# Patient Record
Sex: Female | Born: 1935
Health system: Southern US, Community
[De-identification: ages and names within clinical notes are randomized; demographics above are authoritative.]

## PROBLEM LIST (undated history)

## (undated) DIAGNOSIS — T7840XA Allergy, unspecified, initial encounter: Secondary | ICD-10-CM

## (undated) DIAGNOSIS — I4891 Unspecified atrial fibrillation: Secondary | ICD-10-CM

## (undated) DIAGNOSIS — J45909 Unspecified asthma, uncomplicated: Secondary | ICD-10-CM

## (undated) DIAGNOSIS — K219 Gastro-esophageal reflux disease without esophagitis: Secondary | ICD-10-CM

## (undated) DIAGNOSIS — I509 Heart failure, unspecified: Secondary | ICD-10-CM

## (undated) DIAGNOSIS — A150 Tuberculosis of lung: Secondary | ICD-10-CM

## (undated) DIAGNOSIS — I272 Pulmonary hypertension, unspecified: Secondary | ICD-10-CM

## (undated) DIAGNOSIS — I340 Nonrheumatic mitral (valve) insufficiency: Secondary | ICD-10-CM

## (undated) DIAGNOSIS — E7800 Pure hypercholesterolemia, unspecified: Secondary | ICD-10-CM

## (undated) DIAGNOSIS — J449 Chronic obstructive pulmonary disease, unspecified: Secondary | ICD-10-CM

## (undated) DIAGNOSIS — I10 Essential (primary) hypertension: Secondary | ICD-10-CM

## (undated) HISTORY — PX: CHOLECYSTECTOMY: SHX55

## (undated) HISTORY — DX: Gastro-esophageal reflux disease without esophagitis: K21.9

## (undated) HISTORY — DX: Essential (primary) hypertension: I10

## (undated) HISTORY — DX: Pulmonary hypertension, unspecified: I27.20

## (undated) HISTORY — DX: Nonrheumatic mitral (valve) insufficiency: I34.0

## (undated) HISTORY — DX: Tuberculosis of lung: A15.0

## (undated) HISTORY — DX: Chronic obstructive pulmonary disease, unspecified: J44.9

## (undated) HISTORY — DX: Allergy, unspecified, initial encounter: T78.40XA

## (undated) HISTORY — DX: Pure hypercholesterolemia, unspecified: E78.00

---

## 1962-04-19 HISTORY — PX: TONSILLECTOMY: SUR1361

## 2004-05-20 ENCOUNTER — Ambulatory Visit: Payer: Self-pay | Admitting: Internal Medicine

## 2004-05-29 ENCOUNTER — Ambulatory Visit: Payer: Self-pay | Admitting: Internal Medicine

## 2004-06-23 ENCOUNTER — Ambulatory Visit: Payer: Self-pay | Admitting: Internal Medicine

## 2004-08-17 ENCOUNTER — Ambulatory Visit: Payer: Self-pay | Admitting: Internal Medicine

## 2004-11-20 ENCOUNTER — Ambulatory Visit: Payer: Self-pay | Admitting: Internal Medicine

## 2005-02-26 ENCOUNTER — Ambulatory Visit: Payer: Self-pay

## 2005-08-26 ENCOUNTER — Ambulatory Visit: Payer: Self-pay | Admitting: Internal Medicine

## 2005-09-07 ENCOUNTER — Ambulatory Visit: Payer: Self-pay | Admitting: Internal Medicine

## 2006-03-17 ENCOUNTER — Ambulatory Visit: Payer: Self-pay | Admitting: Internal Medicine

## 2006-04-19 HISTORY — PX: CATARACT EXTRACTION: SUR2

## 2006-09-14 ENCOUNTER — Ambulatory Visit: Payer: Self-pay | Admitting: Internal Medicine

## 2007-03-06 ENCOUNTER — Ambulatory Visit: Payer: Self-pay | Admitting: Specialist

## 2007-09-01 ENCOUNTER — Ambulatory Visit: Payer: Self-pay | Admitting: Gastroenterology

## 2007-10-18 ENCOUNTER — Ambulatory Visit: Payer: Self-pay | Admitting: Internal Medicine

## 2008-03-27 ENCOUNTER — Ambulatory Visit: Payer: Self-pay | Admitting: Internal Medicine

## 2008-09-02 ENCOUNTER — Ambulatory Visit: Payer: Self-pay | Admitting: Gastroenterology

## 2008-10-28 ENCOUNTER — Ambulatory Visit: Payer: Self-pay | Admitting: Internal Medicine

## 2009-01-17 ENCOUNTER — Ambulatory Visit: Payer: Self-pay | Admitting: General Surgery

## 2009-01-22 ENCOUNTER — Ambulatory Visit: Payer: Self-pay | Admitting: General Surgery

## 2009-07-01 ENCOUNTER — Inpatient Hospital Stay: Payer: Self-pay | Admitting: Internal Medicine

## 2009-11-11 ENCOUNTER — Ambulatory Visit: Payer: Self-pay | Admitting: Internal Medicine

## 2009-11-12 ENCOUNTER — Ambulatory Visit: Payer: Self-pay | Admitting: Internal Medicine

## 2010-01-20 ENCOUNTER — Ambulatory Visit: Payer: Self-pay | Admitting: Cardiology

## 2010-12-15 ENCOUNTER — Ambulatory Visit: Payer: Self-pay | Admitting: Internal Medicine

## 2011-05-03 ENCOUNTER — Ambulatory Visit: Payer: Self-pay | Admitting: Internal Medicine

## 2011-05-03 DIAGNOSIS — E785 Hyperlipidemia, unspecified: Secondary | ICD-10-CM | POA: Diagnosis not present

## 2011-05-03 DIAGNOSIS — D72819 Decreased white blood cell count, unspecified: Secondary | ICD-10-CM | POA: Diagnosis not present

## 2011-05-03 DIAGNOSIS — K219 Gastro-esophageal reflux disease without esophagitis: Secondary | ICD-10-CM | POA: Diagnosis not present

## 2011-05-03 DIAGNOSIS — D649 Anemia, unspecified: Secondary | ICD-10-CM | POA: Diagnosis not present

## 2011-05-03 DIAGNOSIS — J449 Chronic obstructive pulmonary disease, unspecified: Secondary | ICD-10-CM | POA: Diagnosis not present

## 2011-05-03 DIAGNOSIS — Z79899 Other long term (current) drug therapy: Secondary | ICD-10-CM | POA: Diagnosis not present

## 2011-05-03 DIAGNOSIS — Z7982 Long term (current) use of aspirin: Secondary | ICD-10-CM | POA: Diagnosis not present

## 2011-05-03 DIAGNOSIS — M810 Age-related osteoporosis without current pathological fracture: Secondary | ICD-10-CM | POA: Diagnosis not present

## 2011-05-03 LAB — RETICULOCYTES: Reticulocyte: 3.4 % — ABNORMAL HIGH (ref 0.5–1.5)

## 2011-05-03 LAB — CBC CANCER CENTER
Basophil #: 0 x10 3/mm (ref 0.0–0.1)
Comment - H1-Com1: NORMAL
Comment - H1-Com2: NORMAL
Lymphocytes: 36 %
MCH: 27 pg (ref 26.0–34.0)
MCHC: 32.7 g/dL (ref 32.0–36.0)
MCV: 83 fL (ref 80–100)
Platelet: 197 x10 3/mm (ref 150–440)

## 2011-05-03 LAB — LACTATE DEHYDROGENASE: LDH: 181 U/L (ref 84–246)

## 2011-05-03 LAB — FERRITIN: Ferritin (ARMC): 28 ng/mL (ref 8–388)

## 2011-05-04 LAB — URINE IEP, RANDOM

## 2011-05-04 LAB — IRON AND TIBC: Iron Bind.Cap.(Total): 321 ug/dL (ref 250–450)

## 2011-05-06 DIAGNOSIS — D72819 Decreased white blood cell count, unspecified: Secondary | ICD-10-CM | POA: Diagnosis not present

## 2011-05-13 LAB — OCCULT BLOOD X 1 CARD TO LAB, STOOL
Occult Blood, Feces: POSITIVE
Occult Blood, Feces: NEGATIVE

## 2011-05-21 ENCOUNTER — Ambulatory Visit: Payer: Self-pay | Admitting: Internal Medicine

## 2011-05-27 DIAGNOSIS — R195 Other fecal abnormalities: Secondary | ICD-10-CM | POA: Diagnosis not present

## 2011-05-27 DIAGNOSIS — D500 Iron deficiency anemia secondary to blood loss (chronic): Secondary | ICD-10-CM | POA: Diagnosis not present

## 2011-06-23 ENCOUNTER — Ambulatory Visit: Payer: Self-pay | Admitting: Internal Medicine

## 2011-06-23 DIAGNOSIS — Z7982 Long term (current) use of aspirin: Secondary | ICD-10-CM | POA: Diagnosis not present

## 2011-06-23 DIAGNOSIS — Z79899 Other long term (current) drug therapy: Secondary | ICD-10-CM | POA: Diagnosis not present

## 2011-06-23 DIAGNOSIS — R161 Splenomegaly, not elsewhere classified: Secondary | ICD-10-CM | POA: Diagnosis not present

## 2011-06-23 DIAGNOSIS — K219 Gastro-esophageal reflux disease without esophagitis: Secondary | ICD-10-CM | POA: Diagnosis not present

## 2011-06-23 DIAGNOSIS — D72819 Decreased white blood cell count, unspecified: Secondary | ICD-10-CM | POA: Diagnosis not present

## 2011-06-23 DIAGNOSIS — E785 Hyperlipidemia, unspecified: Secondary | ICD-10-CM | POA: Diagnosis not present

## 2011-06-23 DIAGNOSIS — M810 Age-related osteoporosis without current pathological fracture: Secondary | ICD-10-CM | POA: Diagnosis not present

## 2011-06-23 DIAGNOSIS — J449 Chronic obstructive pulmonary disease, unspecified: Secondary | ICD-10-CM | POA: Diagnosis not present

## 2011-06-23 DIAGNOSIS — D509 Iron deficiency anemia, unspecified: Secondary | ICD-10-CM | POA: Diagnosis not present

## 2011-06-23 LAB — CBC CANCER CENTER
Basophil #: 0 x10 3/mm (ref 0.0–0.1)
Eosinophil #: 0.2 x10 3/mm (ref 0.0–0.7)
HCT: 37.1 % (ref 35.0–47.0)
Lymphocyte %: 31.4 %
MCH: 27 pg (ref 26.0–34.0)
MCHC: 32.5 g/dL (ref 32.0–36.0)
Monocyte #: 0.4 x10 3/mm (ref 0.0–0.7)
Neutrophil #: 2 x10 3/mm (ref 1.4–6.5)
Neutrophil %: 51.8 %
RBC: 4.46 10*6/uL (ref 3.80–5.20)
RDW: 14.6 % — ABNORMAL HIGH (ref 11.5–14.5)
WBC: 3.9 x10 3/mm (ref 3.6–11.0)

## 2011-06-23 LAB — BASIC METABOLIC PANEL
Anion Gap: 7 (ref 7–16)
Calcium, Total: 9.9 mg/dL (ref 8.5–10.1)
Creatinine: 1.08 mg/dL (ref 0.60–1.30)
EGFR (African American): >60
EGFR (Non-African Amer.): 52 — ABNORMAL LOW
Glucose: 99 mg/dL (ref 65–99)
Osmolality: 285 (ref 275–301)
Sodium: 143 mmol/L (ref 136–145)

## 2011-06-30 DIAGNOSIS — R161 Splenomegaly, not elsewhere classified: Secondary | ICD-10-CM | POA: Diagnosis not present

## 2011-06-30 DIAGNOSIS — D259 Leiomyoma of uterus, unspecified: Secondary | ICD-10-CM | POA: Diagnosis not present

## 2011-06-30 DIAGNOSIS — E785 Hyperlipidemia, unspecified: Secondary | ICD-10-CM | POA: Diagnosis not present

## 2011-06-30 DIAGNOSIS — D509 Iron deficiency anemia, unspecified: Secondary | ICD-10-CM | POA: Diagnosis not present

## 2011-06-30 DIAGNOSIS — M810 Age-related osteoporosis without current pathological fracture: Secondary | ICD-10-CM | POA: Diagnosis not present

## 2011-06-30 DIAGNOSIS — D72819 Decreased white blood cell count, unspecified: Secondary | ICD-10-CM | POA: Diagnosis not present

## 2011-06-30 DIAGNOSIS — K219 Gastro-esophageal reflux disease without esophagitis: Secondary | ICD-10-CM | POA: Diagnosis not present

## 2011-07-05 DIAGNOSIS — R062 Wheezing: Secondary | ICD-10-CM | POA: Diagnosis not present

## 2011-07-05 DIAGNOSIS — I10 Essential (primary) hypertension: Secondary | ICD-10-CM | POA: Diagnosis not present

## 2011-07-05 DIAGNOSIS — J069 Acute upper respiratory infection, unspecified: Secondary | ICD-10-CM | POA: Diagnosis not present

## 2011-07-05 DIAGNOSIS — R05 Cough: Secondary | ICD-10-CM | POA: Diagnosis not present

## 2011-07-09 DIAGNOSIS — R195 Other fecal abnormalities: Secondary | ICD-10-CM | POA: Diagnosis not present

## 2011-07-13 ENCOUNTER — Ambulatory Visit: Payer: Self-pay | Admitting: Gastroenterology

## 2011-07-13 DIAGNOSIS — Z79899 Other long term (current) drug therapy: Secondary | ICD-10-CM | POA: Diagnosis not present

## 2011-07-13 DIAGNOSIS — Z7982 Long term (current) use of aspirin: Secondary | ICD-10-CM | POA: Diagnosis not present

## 2011-07-13 DIAGNOSIS — K2270 Barrett's esophagus without dysplasia: Secondary | ICD-10-CM | POA: Diagnosis not present

## 2011-07-13 DIAGNOSIS — I270 Primary pulmonary hypertension: Secondary | ICD-10-CM | POA: Diagnosis not present

## 2011-07-13 DIAGNOSIS — E7800 Pure hypercholesterolemia, unspecified: Secondary | ICD-10-CM | POA: Diagnosis not present

## 2011-07-13 DIAGNOSIS — Z5309 Procedure and treatment not carried out because of other contraindication: Secondary | ICD-10-CM | POA: Diagnosis not present

## 2011-07-13 DIAGNOSIS — Z8611 Personal history of tuberculosis: Secondary | ICD-10-CM | POA: Diagnosis not present

## 2011-07-13 DIAGNOSIS — K219 Gastro-esophageal reflux disease without esophagitis: Secondary | ICD-10-CM | POA: Diagnosis not present

## 2011-07-13 DIAGNOSIS — Z87891 Personal history of nicotine dependence: Secondary | ICD-10-CM | POA: Diagnosis not present

## 2011-07-13 DIAGNOSIS — D509 Iron deficiency anemia, unspecified: Secondary | ICD-10-CM | POA: Diagnosis not present

## 2011-07-13 DIAGNOSIS — R195 Other fecal abnormalities: Secondary | ICD-10-CM | POA: Diagnosis not present

## 2011-07-13 DIAGNOSIS — K625 Hemorrhage of anus and rectum: Secondary | ICD-10-CM | POA: Diagnosis not present

## 2011-07-13 DIAGNOSIS — Z1211 Encounter for screening for malignant neoplasm of colon: Secondary | ICD-10-CM | POA: Diagnosis not present

## 2011-07-13 DIAGNOSIS — J449 Chronic obstructive pulmonary disease, unspecified: Secondary | ICD-10-CM | POA: Diagnosis not present

## 2011-07-15 DIAGNOSIS — I10 Essential (primary) hypertension: Secondary | ICD-10-CM | POA: Diagnosis not present

## 2011-07-15 DIAGNOSIS — R0602 Shortness of breath: Secondary | ICD-10-CM | POA: Diagnosis not present

## 2011-07-19 ENCOUNTER — Ambulatory Visit: Payer: Self-pay | Admitting: Internal Medicine

## 2011-07-26 DIAGNOSIS — R0602 Shortness of breath: Secondary | ICD-10-CM | POA: Diagnosis not present

## 2011-08-10 DIAGNOSIS — R05 Cough: Secondary | ICD-10-CM | POA: Diagnosis not present

## 2011-08-10 DIAGNOSIS — I10 Essential (primary) hypertension: Secondary | ICD-10-CM | POA: Diagnosis not present

## 2011-08-10 DIAGNOSIS — R062 Wheezing: Secondary | ICD-10-CM | POA: Diagnosis not present

## 2011-08-10 DIAGNOSIS — J069 Acute upper respiratory infection, unspecified: Secondary | ICD-10-CM | POA: Diagnosis not present

## 2011-08-18 ENCOUNTER — Ambulatory Visit: Payer: Self-pay | Admitting: Internal Medicine

## 2011-08-18 DIAGNOSIS — D509 Iron deficiency anemia, unspecified: Secondary | ICD-10-CM | POA: Diagnosis not present

## 2011-08-18 DIAGNOSIS — R161 Splenomegaly, not elsewhere classified: Secondary | ICD-10-CM | POA: Diagnosis not present

## 2011-08-18 DIAGNOSIS — D72819 Decreased white blood cell count, unspecified: Secondary | ICD-10-CM | POA: Diagnosis not present

## 2011-08-18 LAB — CBC CANCER CENTER
Eosinophil %: 2.1 %
HCT: 37.3 % (ref 35.0–47.0)
Lymphocyte #: 1.2 x10 3/mm (ref 1.0–3.6)
Lymphocyte %: 19.4 %
MCH: 26.5 pg (ref 26.0–34.0)
MCV: 83 fL (ref 80–100)
Monocyte #: 0.4 x10 3/mm (ref 0.2–0.9)
Neutrophil %: 72.2 %
Platelet: 212 x10 3/mm (ref 150–440)
RBC: 4.5 10*6/uL (ref 3.80–5.20)
RDW: 15.1 % — ABNORMAL HIGH (ref 11.5–14.5)
WBC: 6.3 x10 3/mm (ref 3.6–11.0)

## 2011-08-18 LAB — IRON AND TIBC
Iron Bind.Cap.(Total): 318 ug/dL (ref 250–450)
Iron Saturation: 23 %
Iron: 74 ug/dL (ref 50–170)

## 2011-08-19 DIAGNOSIS — R0602 Shortness of breath: Secondary | ICD-10-CM | POA: Diagnosis not present

## 2011-08-19 DIAGNOSIS — J449 Chronic obstructive pulmonary disease, unspecified: Secondary | ICD-10-CM | POA: Diagnosis not present

## 2011-09-15 DIAGNOSIS — J310 Chronic rhinitis: Secondary | ICD-10-CM | POA: Diagnosis not present

## 2011-09-15 DIAGNOSIS — J45909 Unspecified asthma, uncomplicated: Secondary | ICD-10-CM | POA: Diagnosis not present

## 2011-09-16 DIAGNOSIS — D509 Iron deficiency anemia, unspecified: Secondary | ICD-10-CM | POA: Diagnosis not present

## 2011-09-16 DIAGNOSIS — K2270 Barrett's esophagus without dysplasia: Secondary | ICD-10-CM | POA: Diagnosis not present

## 2011-09-16 DIAGNOSIS — I10 Essential (primary) hypertension: Secondary | ICD-10-CM | POA: Diagnosis not present

## 2011-09-16 DIAGNOSIS — R195 Other fecal abnormalities: Secondary | ICD-10-CM | POA: Diagnosis not present

## 2011-09-21 ENCOUNTER — Ambulatory Visit: Payer: Self-pay | Admitting: Gastroenterology

## 2011-09-21 DIAGNOSIS — D126 Benign neoplasm of colon, unspecified: Secondary | ICD-10-CM | POA: Diagnosis not present

## 2011-09-21 DIAGNOSIS — D509 Iron deficiency anemia, unspecified: Secondary | ICD-10-CM | POA: Diagnosis not present

## 2011-09-21 DIAGNOSIS — E7800 Pure hypercholesterolemia, unspecified: Secondary | ICD-10-CM | POA: Diagnosis not present

## 2011-09-21 DIAGNOSIS — K2100 Gastro-esophageal reflux disease with esophagitis, without bleeding: Secondary | ICD-10-CM | POA: Diagnosis not present

## 2011-09-21 DIAGNOSIS — K5730 Diverticulosis of large intestine without perforation or abscess without bleeding: Secondary | ICD-10-CM | POA: Diagnosis not present

## 2011-09-21 DIAGNOSIS — I2789 Other specified pulmonary heart diseases: Secondary | ICD-10-CM | POA: Diagnosis not present

## 2011-09-21 DIAGNOSIS — J449 Chronic obstructive pulmonary disease, unspecified: Secondary | ICD-10-CM | POA: Diagnosis not present

## 2011-09-21 DIAGNOSIS — M810 Age-related osteoporosis without current pathological fracture: Secondary | ICD-10-CM | POA: Diagnosis not present

## 2011-09-21 DIAGNOSIS — J309 Allergic rhinitis, unspecified: Secondary | ICD-10-CM | POA: Diagnosis not present

## 2011-09-21 DIAGNOSIS — Z8611 Personal history of tuberculosis: Secondary | ICD-10-CM | POA: Diagnosis not present

## 2011-09-21 DIAGNOSIS — Z87891 Personal history of nicotine dependence: Secondary | ICD-10-CM | POA: Diagnosis not present

## 2011-09-21 DIAGNOSIS — K921 Melena: Secondary | ICD-10-CM | POA: Diagnosis not present

## 2011-09-21 DIAGNOSIS — Z6830 Body mass index (BMI) 30.0-30.9, adult: Secondary | ICD-10-CM | POA: Diagnosis not present

## 2011-09-21 DIAGNOSIS — R195 Other fecal abnormalities: Secondary | ICD-10-CM | POA: Diagnosis not present

## 2011-09-21 DIAGNOSIS — Q438 Other specified congenital malformations of intestine: Secondary | ICD-10-CM | POA: Diagnosis not present

## 2011-09-22 LAB — PATHOLOGY REPORT

## 2011-10-13 ENCOUNTER — Ambulatory Visit: Payer: Self-pay | Admitting: Internal Medicine

## 2011-10-13 DIAGNOSIS — D72819 Decreased white blood cell count, unspecified: Secondary | ICD-10-CM | POA: Diagnosis not present

## 2011-10-13 DIAGNOSIS — R161 Splenomegaly, not elsewhere classified: Secondary | ICD-10-CM | POA: Diagnosis not present

## 2011-10-13 DIAGNOSIS — D509 Iron deficiency anemia, unspecified: Secondary | ICD-10-CM | POA: Diagnosis not present

## 2011-10-13 LAB — CBC CANCER CENTER
Basophil %: 0.4 %
Eosinophil #: 0.1 x10 3/mm (ref 0.0–0.7)
Eosinophil %: 2.7 %
Lymphocyte #: 1.2 x10 3/mm (ref 1.0–3.6)
Lymphocyte %: 24 %
MCV: 85 fL (ref 80–100)
Monocyte #: 0.4 x10 3/mm (ref 0.2–0.9)
Monocyte %: 7.1 %
Platelet: 199 x10 3/mm (ref 150–440)
RDW: 15.2 % — ABNORMAL HIGH (ref 11.5–14.5)

## 2011-10-14 DIAGNOSIS — R195 Other fecal abnormalities: Secondary | ICD-10-CM | POA: Diagnosis not present

## 2011-10-15 ENCOUNTER — Ambulatory Visit: Payer: Self-pay | Admitting: Gastroenterology

## 2011-10-15 DIAGNOSIS — K2100 Gastro-esophageal reflux disease with esophagitis, without bleeding: Secondary | ICD-10-CM | POA: Diagnosis not present

## 2011-10-18 ENCOUNTER — Ambulatory Visit: Payer: Self-pay | Admitting: Internal Medicine

## 2011-10-28 DIAGNOSIS — J45909 Unspecified asthma, uncomplicated: Secondary | ICD-10-CM | POA: Diagnosis not present

## 2011-10-28 DIAGNOSIS — J310 Chronic rhinitis: Secondary | ICD-10-CM | POA: Diagnosis not present

## 2011-10-29 DIAGNOSIS — R195 Other fecal abnormalities: Secondary | ICD-10-CM | POA: Diagnosis not present

## 2011-10-29 LAB — IFOBT (OCCULT BLOOD): IFOBT: NEGATIVE

## 2011-12-08 ENCOUNTER — Ambulatory Visit: Payer: Self-pay | Admitting: Internal Medicine

## 2011-12-08 DIAGNOSIS — M810 Age-related osteoporosis without current pathological fracture: Secondary | ICD-10-CM | POA: Diagnosis not present

## 2011-12-08 DIAGNOSIS — D509 Iron deficiency anemia, unspecified: Secondary | ICD-10-CM | POA: Diagnosis not present

## 2011-12-08 DIAGNOSIS — Z7982 Long term (current) use of aspirin: Secondary | ICD-10-CM | POA: Diagnosis not present

## 2011-12-08 DIAGNOSIS — E785 Hyperlipidemia, unspecified: Secondary | ICD-10-CM | POA: Diagnosis not present

## 2011-12-08 DIAGNOSIS — K219 Gastro-esophageal reflux disease without esophagitis: Secondary | ICD-10-CM | POA: Diagnosis not present

## 2011-12-08 DIAGNOSIS — R161 Splenomegaly, not elsewhere classified: Secondary | ICD-10-CM | POA: Diagnosis not present

## 2011-12-08 DIAGNOSIS — D72819 Decreased white blood cell count, unspecified: Secondary | ICD-10-CM | POA: Diagnosis not present

## 2011-12-08 DIAGNOSIS — J449 Chronic obstructive pulmonary disease, unspecified: Secondary | ICD-10-CM | POA: Diagnosis not present

## 2011-12-08 DIAGNOSIS — Z79899 Other long term (current) drug therapy: Secondary | ICD-10-CM | POA: Diagnosis not present

## 2011-12-08 LAB — CBC CANCER CENTER
Basophil %: 0.7 %
Eosinophil #: 0.1 x10 3/mm (ref 0.0–0.7)
Eosinophil %: 2.9 %
HCT: 38.3 % (ref 35.0–47.0)
Lymphocyte #: 1.4 x10 3/mm (ref 1.0–3.6)
MCH: 27 pg (ref 26.0–34.0)
MCV: 85 fL (ref 80–100)
Monocyte %: 6.1 %
Neutrophil #: 3.2 x10 3/mm (ref 1.4–6.5)
Platelet: 192 x10 3/mm (ref 150–440)
RBC: 4.49 10*6/uL (ref 3.80–5.20)
RDW: 14.6 % — ABNORMAL HIGH (ref 11.5–14.5)
WBC: 5.1 x10 3/mm (ref 3.6–11.0)

## 2011-12-08 LAB — IRON AND TIBC
Iron Bind.Cap.(Total): 310 ug/dL (ref 250–450)
Iron: 90 ug/dL (ref 50–170)
Unbound Iron-Bind.Cap.: 220 ug/dL

## 2011-12-19 ENCOUNTER — Ambulatory Visit: Payer: Self-pay | Admitting: Internal Medicine

## 2012-01-10 DIAGNOSIS — K644 Residual hemorrhoidal skin tags: Secondary | ICD-10-CM | POA: Diagnosis not present

## 2012-01-10 DIAGNOSIS — D509 Iron deficiency anemia, unspecified: Secondary | ICD-10-CM | POA: Diagnosis not present

## 2012-01-12 DIAGNOSIS — Z23 Encounter for immunization: Secondary | ICD-10-CM | POA: Diagnosis not present

## 2012-02-14 ENCOUNTER — Telehealth: Payer: Self-pay | Admitting: Internal Medicine

## 2012-02-14 NOTE — Telephone Encounter (Signed)
Patient has been scheduled at [facility]  On 12.12.13 @ 11:40 she is aware of this appointment.

## 2012-03-30 ENCOUNTER — Ambulatory Visit: Payer: Self-pay | Admitting: Internal Medicine

## 2012-03-30 DIAGNOSIS — Z1231 Encounter for screening mammogram for malignant neoplasm of breast: Secondary | ICD-10-CM | POA: Diagnosis not present

## 2012-04-05 ENCOUNTER — Encounter: Payer: Self-pay | Admitting: Internal Medicine

## 2012-04-24 ENCOUNTER — Encounter: Payer: Self-pay | Admitting: Internal Medicine

## 2012-04-25 DIAGNOSIS — R0602 Shortness of breath: Secondary | ICD-10-CM | POA: Diagnosis not present

## 2012-04-25 DIAGNOSIS — J45909 Unspecified asthma, uncomplicated: Secondary | ICD-10-CM | POA: Diagnosis not present

## 2012-05-24 ENCOUNTER — Encounter: Payer: Self-pay | Admitting: Internal Medicine

## 2012-05-26 ENCOUNTER — Encounter: Payer: Self-pay | Admitting: Internal Medicine

## 2012-05-26 ENCOUNTER — Ambulatory Visit (INDEPENDENT_AMBULATORY_CARE_PROVIDER_SITE_OTHER): Payer: Medicare Other | Admitting: Internal Medicine

## 2012-05-26 VITALS — BP 158/78 | HR 99 | Temp 98.4°F | Ht 63.5 in | Wt 182.0 lb

## 2012-05-26 DIAGNOSIS — K219 Gastro-esophageal reflux disease without esophagitis: Secondary | ICD-10-CM

## 2012-05-26 DIAGNOSIS — J45909 Unspecified asthma, uncomplicated: Secondary | ICD-10-CM

## 2012-05-26 DIAGNOSIS — K2270 Barrett's esophagus without dysplasia: Secondary | ICD-10-CM

## 2012-05-26 DIAGNOSIS — I10 Essential (primary) hypertension: Secondary | ICD-10-CM

## 2012-05-28 ENCOUNTER — Encounter: Payer: Self-pay | Admitting: Internal Medicine

## 2012-05-28 DIAGNOSIS — I10 Essential (primary) hypertension: Secondary | ICD-10-CM | POA: Insufficient documentation

## 2012-05-28 DIAGNOSIS — J45909 Unspecified asthma, uncomplicated: Secondary | ICD-10-CM | POA: Insufficient documentation

## 2012-05-28 DIAGNOSIS — K2270 Barrett's esophagus without dysplasia: Secondary | ICD-10-CM | POA: Insufficient documentation

## 2012-05-28 DIAGNOSIS — K219 Gastro-esophageal reflux disease without esophagitis: Secondary | ICD-10-CM | POA: Insufficient documentation

## 2012-05-28 NOTE — Assessment & Plan Note (Signed)
Symptoms controlled.  Continue to follow up with Dr Gustavo Lah for surveillance EGD.

## 2012-05-28 NOTE — Assessment & Plan Note (Signed)
Symptoms controlled.  Follow.   

## 2012-05-28 NOTE — Assessment & Plan Note (Signed)
Blood pressure elevated here.  Outside checks under good control.  Same medication regimen.  Follow.  Have her spot check readings.

## 2012-05-28 NOTE — Progress Notes (Signed)
Subjective:    Patient ID: Anita Ferrell, female    DOB: 1932-09-18, 79 y.o.   MRN: [id number]  HPI 79 year old female with past history of hypertension, asthma, GERD and BARRETTS who comes in today for a scheduled follow up.  She states her breathing is stable.  Due to follow up with GI next month.  Hemorrhoids doing better.  Taking a stool softener.  Two weeks ago - noticed some blood with wiping.  Resolved and has not noticed any since.  Had a full GI w/up.  States had EGD, colonoscopy and SBFT.  States everything checked out ok.  No nausea or vomiting.  Reflux controlled.  Blood pressure has been doing well.  Highest reading for her - [id number] systolic.     Past Medical History  Diagnosis Date  . Allergy   . COPD (chronic obstructive pulmonary disease)   . Hypercholesteremia   . TB (pulmonary tuberculosis)     treated with INH therapy   . Pulmonary hypertension     Right Ventricular systolic pressure at 60 mm Hg by echo on july of 2011; Right heart cardic catherization revealed pulmonary artery pressusre of 27/10 with a mean of; Ventricular pressure 29/10 with pulmonary capillary wedge at 9  . GERD (gastroesophageal reflux disease)   . Hypertension     Current Outpatient Prescriptions on File Prior to Visit  Medication Sig Dispense Refill  . albuterol (PROVENTIL HFA;VENTOLIN HFA) 108 (90 BASE) MCG/ACT inhaler Inhale 2 puffs into the lungs every 6 (six) hours as needed.       Marland Kitchen amLODipine (NORVASC) 5 MG tablet Take 5 mg by mouth daily.       Marland Kitchen aspirin EC 81 MG tablet Take 81 mg by mouth daily.       Marland Kitchen azelastine (ASTELIN) 137 MCG/SPRAY nasal spray Place 2 sprays into the nose 2 (two) times daily.       . budesonide-formoterol (SYMBICORT) 160-4.5 MCG/ACT inhaler Inhale 2 puffs into the lungs 2 (two) times daily.       . cholecalciferol (VITAMIN D) 400 UNITS TABS Take 1,000 Units by mouth daily.       . fexofenadine (ALLEGRA) 180 MG tablet Take 180 mg by mouth daily.       Marland Kitchen guaiFENesin  (MUCINEX) 600 MG 12 hr tablet Take 1,200 mg by mouth 2 (two) times daily.       . lansoprazole (PREVACID) 30 MG capsule Take 30 mg by mouth daily.       . Multiple Vitamin (MULTIVITAMIN) capsule Take 1 capsule by mouth daily.      Marland Kitchen tiotropium (SPIRIVA) 18 MCG inhalation capsule Place 18 mcg into inhaler and inhale daily.        No current facility-administered medications on file prior to visit.    Review of Systems Patient denies any headache, lightheadedness or dizziness.  No sinus or allergy symptoms.  No chest pain, tightness or palpitations.  No increased shortness of breath, cough or congestion.  Breathing stable.  No nausea or vomiting. Acid reflux controlled.  No abdominal pain or cramping.  No bowel change, such as diarrhea, constipation, or melana currently.  Previous bleeding.  See above. Hemorrhoids.  No urine change.        Objective:   Physical Exam Filed Vitals:   05/26/12 1148  BP: 158/78  Pulse: 99  Temp: 98.4 F (36.9 C)   Blood pressure recheck:  148/78, pulse 30  79 year old female in no acute distress.  HEENT:  Nares- clear.  Oropharynx - without lesions. NECK:  Supple.  Nontender.  No audible bruit.  HEART:  Appears to be regular. LUNGS:  No crackles or wheezing audible.  Respirations even and unlabored.  RADIAL PULSE:  Equal bilaterally.  ABDOMEN:  Soft, nontender.  Bowel sounds present and normal.  No audible abdominal bruit.   EXTREMITIES:  No increased edema present.  DP pulses palpable and equal bilaterally.           Assessment & Plan:  HEALTH MAINTENANCE.  Schedule a physical for next visit.  Obtain outside records for review.

## 2012-05-28 NOTE — Assessment & Plan Note (Signed)
Breathing is stable.  Continues to follow up with Dr [name].

## 2012-06-03 ENCOUNTER — Other Ambulatory Visit: Payer: Self-pay

## 2012-06-21 ENCOUNTER — Other Ambulatory Visit: Payer: Self-pay | Admitting: *Deleted

## 2012-06-21 MED ORDER — AMLODIPINE BESYLATE 5 MG PO TABS: 5.0000 mg | ORAL_TABLET | Freq: Every day | ORAL | Status: DC

## 2012-06-21 NOTE — Telephone Encounter (Signed)
Sent in to pharmacy.  

## 2012-06-26 DIAGNOSIS — R933 Abnormal findings on diagnostic imaging of other parts of digestive tract: Secondary | ICD-10-CM | POA: Diagnosis not present

## 2012-06-26 DIAGNOSIS — K625 Hemorrhage of anus and rectum: Secondary | ICD-10-CM | POA: Diagnosis not present

## 2012-06-29 ENCOUNTER — Ambulatory Visit: Payer: Self-pay | Admitting: Gastroenterology

## 2012-06-29 DIAGNOSIS — N200 Calculus of kidney: Secondary | ICD-10-CM | POA: Diagnosis not present

## 2012-06-29 DIAGNOSIS — N209 Urinary calculus, unspecified: Secondary | ICD-10-CM | POA: Diagnosis not present

## 2012-06-29 DIAGNOSIS — R161 Splenomegaly, not elsewhere classified: Secondary | ICD-10-CM | POA: Diagnosis not present

## 2012-08-25 ENCOUNTER — Ambulatory Visit (INDEPENDENT_AMBULATORY_CARE_PROVIDER_SITE_OTHER): Payer: Medicare Other | Admitting: Internal Medicine

## 2012-08-25 ENCOUNTER — Encounter: Payer: Self-pay | Admitting: Internal Medicine

## 2012-08-25 VITALS — BP 140/80 | HR 112 | Temp 98.6°F | Ht 63.25 in | Wt 178.0 lb

## 2012-08-25 DIAGNOSIS — E7800 Pure hypercholesterolemia, unspecified: Secondary | ICD-10-CM | POA: Diagnosis not present

## 2012-08-25 DIAGNOSIS — K219 Gastro-esophageal reflux disease without esophagitis: Secondary | ICD-10-CM

## 2012-08-25 DIAGNOSIS — J45909 Unspecified asthma, uncomplicated: Secondary | ICD-10-CM

## 2012-08-25 DIAGNOSIS — I10 Essential (primary) hypertension: Secondary | ICD-10-CM

## 2012-08-25 DIAGNOSIS — D649 Anemia, unspecified: Secondary | ICD-10-CM

## 2012-08-25 DIAGNOSIS — K2270 Barrett's esophagus without dysplasia: Secondary | ICD-10-CM

## 2012-08-25 LAB — COMPREHENSIVE METABOLIC PANEL
ALT: 19 U/L (ref 0–35)
Albumin: 4.1 g/dL (ref 3.5–5.2)
Alkaline Phosphatase: 71 U/L (ref 39–117)
CO2: 26 mEq/L (ref 19–32)
GFR: 48.47 mL/min — ABNORMAL LOW (ref >60.00)
Glucose, Bld: 118 mg/dL — ABNORMAL HIGH (ref 70–99)
Potassium: 3.8 mEq/L (ref 3.5–5.1)
Sodium: 141 mEq/L (ref 135–145)
Total Protein: 6.7 g/dL (ref 6.0–8.3)

## 2012-08-25 LAB — CBC WITH DIFFERENTIAL/PLATELET
Basophils Absolute: 0 10*3/uL (ref 0.0–0.1)
Eosinophils Absolute: 0.1 10*3/uL (ref 0.0–0.7)
Hemoglobin: 13.1 g/dL (ref 12.0–15.0)
Lymphocytes Relative: 18.4 % (ref 12.0–46.0)
MCHC: 33.7 g/dL (ref 30.0–36.0)
Monocytes Relative: 6.2 % (ref 3.0–12.0)
Neutrophils Relative %: 72.4 % (ref 43.0–77.0)
RBC: 4.74 Mil/uL (ref 3.87–5.11)
RDW: 14.5 % (ref 11.5–14.6)

## 2012-08-25 LAB — LDL CHOLESTEROL, DIRECT: Direct LDL: 127.1 mg/dL

## 2012-08-25 LAB — TSH: TSH: 0.59 u[IU]/mL (ref 0.35–5.50)

## 2012-08-25 LAB — LIPID PANEL: VLDL: 21.8 mg/dL (ref 0.0–40.0)

## 2012-08-25 LAB — FERRITIN: Ferritin: 29.8 ng/mL (ref 10.0–291.0)

## 2012-08-26 ENCOUNTER — Encounter: Payer: Self-pay | Admitting: Internal Medicine

## 2012-08-26 ENCOUNTER — Telehealth: Payer: Self-pay | Admitting: Internal Medicine

## 2012-08-26 DIAGNOSIS — R739 Hyperglycemia, unspecified: Secondary | ICD-10-CM

## 2012-08-26 NOTE — Telephone Encounter (Signed)
Pt notified of labs via my chart.  She needs a fasting lab appt in 2-3 weeks.  Please schedule and notify her of appt date and time.  Also let her know that she can have black coffee and water prior to a fasting lab appt.  I want her to stay hydrated.  Thanks.

## 2012-08-27 ENCOUNTER — Encounter: Payer: Self-pay | Admitting: Internal Medicine

## 2012-08-27 DIAGNOSIS — D649 Anemia, unspecified: Secondary | ICD-10-CM | POA: Insufficient documentation

## 2012-08-27 NOTE — Assessment & Plan Note (Signed)
Breathing is stable.  Continues to follow up with Dr [name].

## 2012-08-27 NOTE — Assessment & Plan Note (Signed)
Blood pressure as outlined.  Outside checks under good control.  Same medication regimen.  Follow.  Have her spot check readings.  Check metabolic panel.

## 2012-08-27 NOTE — Assessment & Plan Note (Signed)
Low cholesterol diet.  Check lipid panel.

## 2012-08-27 NOTE — Assessment & Plan Note (Signed)
Symptoms controlled.  Continue to follow up with Dr Gustavo Lah for surveillance EGD.  Last EGD 09/21/11 - reflux esophagitis.  Recommended f/u EGD in three years.

## 2012-08-27 NOTE — Assessment & Plan Note (Signed)
Has had extensive GI w/up.  Worked up by Dr Ma Hillock.  Has been released.  Follow cbc.

## 2012-08-27 NOTE — Assessment & Plan Note (Signed)
Symptoms controlled.  Follow.   

## 2012-08-27 NOTE — Progress Notes (Signed)
Subjective:    Patient ID: Anita Ferrell, female    DOB: [birth year], 79 y.o.   MRN: [id number]  HPI 79 year old female with past history of hypertension, asthma, GERD and BARRETTS who comes in today to follow up on these issues as well as for a complete physical exam.  She states her breathing is stable.  Has f/u planned with Dr [name] in two months.  Uses her inhalers regularly.  Taking a stool softener.  Had a full GI w/up.  Is s/p EGD, colonoscopy and SBFT.  Still has problems with hemorrhoids.  Plans to see surgery about the hemorrhoids.  No nausea or vomiting.  Reflux controlled.  Blood pressure has been doing well.  Overall she feels things are stable.      Past Medical History  Diagnosis Date  . Allergy   . COPD (chronic obstructive pulmonary disease)   . Hypercholesteremia   . TB (pulmonary tuberculosis)     treated with INH therapy   . Pulmonary hypertension     Right Ventricular systolic pressure at 60 mm Hg by echo on july of 2011; Right heart cardic catherization revealed pulmonary artery pressusre of 27/10 with a mean of; Ventricular pressure 29/10 with pulmonary capillary wedge at 9  . GERD (gastroesophageal reflux disease)   . Hypertension     Current Outpatient Prescriptions on File Prior to Visit  Medication Sig Dispense Refill  . albuterol (PROVENTIL HFA;VENTOLIN HFA) 108 (90 BASE) MCG/ACT inhaler Inhale 2 puffs into the lungs every 6 (six) hours as needed.       Marland Kitchen amLODipine (NORVASC) 5 MG tablet Take 1 tablet (5 mg total) by mouth daily.  30 tablet  5  . aspirin EC 81 MG tablet Take 81 mg by mouth daily.       . budesonide-formoterol (SYMBICORT) 160-4.5 MCG/ACT inhaler Inhale 2 puffs into the lungs 2 (two) times daily.       . cholecalciferol (VITAMIN D) 400 UNITS TABS Take 1,000 Units by mouth daily.       . fexofenadine (ALLEGRA) 180 MG tablet Take 180 mg by mouth daily.       Marland Kitchen guaiFENesin (MUCINEX) 600 MG 12 hr tablet Take 600 mg by mouth daily.       .  lansoprazole (PREVACID) 30 MG capsule Take 30 mg by mouth daily.       . Multiple Vitamin (MULTIVITAMIN) capsule Take 1 capsule by mouth daily.      . predniSONE (DELTASONE) 5 MG tablet Take 5 mg by mouth daily.       . theophylline (THEODUR) 200 MG 12 hr tablet Take 200 mg by mouth 2 (two) times daily.       Marland Kitchen tiotropium (SPIRIVA) 18 MCG inhalation capsule Place 18 mcg into inhaler and inhale daily.        No current facility-administered medications on file prior to visit.    Review of Systems Patient denies any headache, lightheadedness or dizziness.  No sinus or allergy symptoms.  No chest pain, tightness or palpitations.  No increased shortness of breath, cough or congestion.  Breathing stable.  No nausea or vomiting. Acid reflux controlled.  No abdominal pain or cramping.  No bowel change, such as diarrhea, constipation, or melana currently.  Previous bleeding.  See above. Hemorrhoids.  Plans to see surgery.  Has had full GI w/up.  No urine change.        Objective:   Physical Exam  Filed Vitals:   08/25/12 1027  BP: 140/80  Pulse: 112  Temp: 98.6 F (37 C)   Blood pressure recheck:  142/72, pulse 44  79 year old female in no acute distress.   HEENT:  Nares- clear.  Oropharynx - without lesions. NECK:  Supple.  Nontender.  No audible bruit.  HEART:  Appears to be regular. LUNGS:  No crackles or wheezing audible.  Respirations even and unlabored.  RADIAL PULSE:  Equal bilaterally.    BREASTS:  No nipple discharge or nipple retraction present.  Could not appreciate any distinct nodules or axillary adenopathy.  ABDOMEN:  Soft, nontender.  Bowel sounds present and normal.  No audible abdominal bruit.  GU:  She declined.  RECTAL:  She declined.    EXTREMITIES:  No increased edema present.  DP pulses palpable and equal bilaterally.           Assessment & Plan:  HEALTH MAINTENANCE.  Physical today.  She declined GU/pelvic exam.  Colonoscopy 09/21/11.  Recommended f/u 10 years.    Mammogram 03/30/12 - BiRads II.

## 2012-08-28 NOTE — Telephone Encounter (Signed)
Appointment 5/29 pt aware

## 2012-09-14 ENCOUNTER — Other Ambulatory Visit (INDEPENDENT_AMBULATORY_CARE_PROVIDER_SITE_OTHER): Payer: Medicare Other

## 2012-09-14 DIAGNOSIS — R739 Hyperglycemia, unspecified: Secondary | ICD-10-CM

## 2012-09-14 DIAGNOSIS — R7309 Other abnormal glucose: Secondary | ICD-10-CM

## 2012-09-14 LAB — BASIC METABOLIC PANEL
CO2: 30 mEq/L (ref 19–32)
Calcium: 9.4 mg/dL (ref 8.4–10.5)
GFR: 47.98 mL/min — ABNORMAL LOW (ref >60.00)
Sodium: 142 mEq/L (ref 135–145)

## 2012-09-14 LAB — HEMOGLOBIN A1C: Hgb A1c MFr Bld: 5.9 % (ref 4.6–6.5)

## 2012-09-15 ENCOUNTER — Other Ambulatory Visit: Payer: Self-pay | Admitting: Internal Medicine

## 2012-09-15 ENCOUNTER — Telehealth: Payer: Self-pay | Admitting: Internal Medicine

## 2012-09-15 ENCOUNTER — Encounter: Payer: Self-pay | Admitting: Internal Medicine

## 2012-09-15 DIAGNOSIS — N289 Disorder of kidney and ureter, unspecified: Secondary | ICD-10-CM

## 2012-09-15 NOTE — Progress Notes (Signed)
F/u labs ordered.   

## 2012-09-15 NOTE — Telephone Encounter (Signed)
Pt notified of lab results via my chart and the need for f/u lab within the next 8 weeks.  Please schedule her for a non fasting lab appt in 8 weeks.  Please call pt with appt date and time.  Thanks.

## 2012-09-18 NOTE — Telephone Encounter (Signed)
Sent my chart message letting pt know about appointment

## 2012-09-21 DIAGNOSIS — J449 Chronic obstructive pulmonary disease, unspecified: Secondary | ICD-10-CM | POA: Diagnosis not present

## 2012-09-21 DIAGNOSIS — J45909 Unspecified asthma, uncomplicated: Secondary | ICD-10-CM | POA: Diagnosis not present

## 2012-09-28 ENCOUNTER — Encounter: Payer: Self-pay | Admitting: Internal Medicine

## 2012-10-27 ENCOUNTER — Telehealth: Payer: Self-pay | Admitting: *Deleted

## 2012-10-27 ENCOUNTER — Other Ambulatory Visit: Payer: Self-pay | Admitting: *Deleted

## 2012-10-27 ENCOUNTER — Encounter: Payer: Self-pay | Admitting: Internal Medicine

## 2012-10-27 MED ORDER — AZELASTINE HCL 0.1 % NA SOLN: 1.0000 | Freq: Two times a day (BID) | NASAL | Status: DC

## 2012-10-27 NOTE — Telephone Encounter (Signed)
Okay to refill? 

## 2012-10-27 NOTE — Telephone Encounter (Signed)
Refill sent to [facility] 

## 2012-10-27 NOTE — Telephone Encounter (Signed)
See attached.  I do not prescribe these.  Her pulmonologist prescribes.

## 2012-10-27 NOTE — Telephone Encounter (Signed)
Refill Request  Aastepro 205.25mcg/INH nose SOL ML  #30   Use one spray in each nostril twice daily

## 2012-10-27 NOTE — Telephone Encounter (Signed)
I do not prescribe these medications.  She gets these from her pulmonologist (I think is Dr [name]).  She or pharmacy needs to call his office.

## 2012-10-27 NOTE — Telephone Encounter (Signed)
Pt informed & rescheduled lab to 7/29 @ 10:30 & nurse visit @ 10:45.

## 2012-10-27 NOTE — Telephone Encounter (Signed)
If she is due for pneumovax - ok to get.  The tdap is on back order.

## 2012-10-27 NOTE — Telephone Encounter (Signed)
Pt wants to know if she could get a pneumonia & Tetanus shot when she comes in for her lab appt on 7/28.

## 2012-11-13 ENCOUNTER — Other Ambulatory Visit: Payer: Medicare Other

## 2012-11-14 ENCOUNTER — Ambulatory Visit (INDEPENDENT_AMBULATORY_CARE_PROVIDER_SITE_OTHER): Payer: Medicare Other | Admitting: *Deleted

## 2012-11-14 ENCOUNTER — Other Ambulatory Visit (INDEPENDENT_AMBULATORY_CARE_PROVIDER_SITE_OTHER): Payer: Medicare Other

## 2012-11-14 DIAGNOSIS — N289 Disorder of kidney and ureter, unspecified: Secondary | ICD-10-CM | POA: Diagnosis not present

## 2012-11-14 DIAGNOSIS — Z23 Encounter for immunization: Secondary | ICD-10-CM | POA: Diagnosis not present

## 2012-11-14 LAB — BASIC METABOLIC PANEL
BUN: 14 mg/dL (ref 6–23)
Chloride: 104 mEq/L (ref 96–112)
Creatinine, Ser: 1.2 mg/dL (ref 0.4–1.2)
Glucose, Bld: 97 mg/dL (ref 70–99)
Potassium: 4.2 mEq/L (ref 3.5–5.1)

## 2012-11-17 ENCOUNTER — Encounter: Payer: Self-pay | Admitting: Internal Medicine

## 2012-11-30 ENCOUNTER — Other Ambulatory Visit: Payer: Self-pay | Admitting: Internal Medicine

## 2012-12-08 DIAGNOSIS — K648 Other hemorrhoids: Secondary | ICD-10-CM | POA: Diagnosis not present

## 2012-12-11 DIAGNOSIS — K648 Other hemorrhoids: Secondary | ICD-10-CM | POA: Diagnosis not present

## 2012-12-26 ENCOUNTER — Encounter: Payer: Self-pay | Admitting: Internal Medicine

## 2012-12-26 ENCOUNTER — Ambulatory Visit (INDEPENDENT_AMBULATORY_CARE_PROVIDER_SITE_OTHER): Payer: Medicare Other | Admitting: Internal Medicine

## 2012-12-26 VITALS — BP 130/80 | HR 93 | Temp 98.5°F | Ht 63.25 in | Wt 174.8 lb

## 2012-12-26 DIAGNOSIS — J45909 Unspecified asthma, uncomplicated: Secondary | ICD-10-CM

## 2012-12-26 DIAGNOSIS — R7309 Other abnormal glucose: Secondary | ICD-10-CM

## 2012-12-26 DIAGNOSIS — E7800 Pure hypercholesterolemia, unspecified: Secondary | ICD-10-CM

## 2012-12-26 DIAGNOSIS — K2270 Barrett's esophagus without dysplasia: Secondary | ICD-10-CM

## 2012-12-26 DIAGNOSIS — I10 Essential (primary) hypertension: Secondary | ICD-10-CM

## 2012-12-26 DIAGNOSIS — K219 Gastro-esophageal reflux disease without esophagitis: Secondary | ICD-10-CM | POA: Diagnosis not present

## 2012-12-26 DIAGNOSIS — D649 Anemia, unspecified: Secondary | ICD-10-CM

## 2012-12-26 DIAGNOSIS — Z23 Encounter for immunization: Secondary | ICD-10-CM

## 2012-12-26 DIAGNOSIS — R739 Hyperglycemia, unspecified: Secondary | ICD-10-CM

## 2012-12-26 DIAGNOSIS — K649 Unspecified hemorrhoids: Secondary | ICD-10-CM

## 2012-12-30 ENCOUNTER — Encounter: Payer: Self-pay | Admitting: Internal Medicine

## 2012-12-30 DIAGNOSIS — K640 First degree hemorrhoids: Secondary | ICD-10-CM | POA: Insufficient documentation

## 2012-12-30 DIAGNOSIS — K649 Unspecified hemorrhoids: Secondary | ICD-10-CM | POA: Insufficient documentation

## 2012-12-30 DIAGNOSIS — R739 Hyperglycemia, unspecified: Secondary | ICD-10-CM | POA: Insufficient documentation

## 2012-12-30 NOTE — Assessment & Plan Note (Signed)
Blood pressure as outlined.  Outside checks under good control.  Same medication regimen.  Follow.  Have her spot check readings.  Follow metabolic panel.

## 2012-12-30 NOTE — Assessment & Plan Note (Signed)
Symptoms controlled.  Continue to follow up with Dr Gustavo Lah for surveillance EGD.  Last EGD 09/21/11 - reflux esophagitis.  Recommended f/u EGD in three years.

## 2012-12-30 NOTE — Assessment & Plan Note (Signed)
S/p banding.  Doing well.  Has follow up with Dr Tamala Julian in two weeks.

## 2012-12-30 NOTE — Progress Notes (Signed)
Subjective:    Patient ID: Anita Ferrell, female    DOB: 10/20/1932, 79 y.o.   MRN: [id number]  HPI 79 year old female with past history of hypertension, asthma, GERD and BARRETTS who comes in today for a scheduled follow up.  She states her breathing is stable.  Uses her inhalers regularly.  Follows with Dr [name].  Taking a stool softener.  Had a full GI w/up.  Is s/p EGD, colonoscopy and SBFT.  Was still having problems with hemorrhoids.  Saw Dr Tamala Julian.  Is s/p banding.  Dong well.  Has follow up planned in two weeks.  No nausea or vomiting.  Reflux controlled.  Blood pressure has been doing well.  Overall she feels things are stable.  Has tried to watch her diet.  Has lost 4 pounds.       Past Medical History  Diagnosis Date  . Allergy   . COPD (chronic obstructive pulmonary disease)   . Hypercholesteremia   . TB (pulmonary tuberculosis)     treated with INH therapy   . Pulmonary hypertension     Right Ventricular systolic pressure at 60 mm Hg by echo on july of 2011; Right heart cardic catherization revealed pulmonary artery pressusre of 27/10 with a mean of; Ventricular pressure 29/10 with pulmonary capillary wedge at 9  . GERD (gastroesophageal reflux disease)   . Hypertension     Current Outpatient Prescriptions on File Prior to Visit  Medication Sig Dispense Refill  . albuterol (PROVENTIL HFA;VENTOLIN HFA) 108 (90 BASE) MCG/ACT inhaler Inhale 2 puffs into the lungs every 6 (six) hours as needed.       Marland Kitchen amLODipine (NORVASC) 5 MG tablet Take 1 tablet (5 mg total) by mouth daily.  30 tablet  5  . aspirin EC 81 MG tablet Take 81 mg by mouth daily.       Marland Kitchen azelastine (ASTELIN) 137 MCG/SPRAY nasal spray Place 1 spray into the nose 2 (two) times daily. Use in each nostril as directed  30 mL  5  . budesonide-formoterol (SYMBICORT) 160-4.5 MCG/ACT inhaler Inhale 2 puffs into the lungs 2 (two) times daily.       . Calcium Carbonate-Vitamin D (CALTRATE 600+D PO) Take 600 mg by mouth  daily.      [name] Sodium 30-100 MG CAPS Take by mouth. Stool softner      . cholecalciferol (VITAMIN D) 400 UNITS TABS Take 1,000 Units by mouth daily.       . fexofenadine (ALLEGRA) 180 MG tablet Take 180 mg by mouth daily.       Marland Kitchen guaiFENesin (MUCINEX) 600 MG 12 hr tablet Take 600 mg by mouth daily.       . lansoprazole (PREVACID) 30 MG capsule Take 30 mg by mouth daily.       . Multiple Vitamin (MULTIVITAMIN) capsule Take 1 capsule by mouth daily.      . Multiple Vitamins-Iron (MULTIVITAMINS WITH IRON) TABS Take 1 tablet by mouth daily.      . predniSONE (DELTASONE) 5 MG tablet Take 5 mg by mouth daily.       Marland Kitchen SPIRIVA HANDIHALER 18 MCG inhalation capsule INHALE CONTENTS OF ONE CAPSULE ONCE A DAY AS DIRECTED  30 capsule  5  . theophylline (THEODUR) 200 MG 12 hr tablet Take 200 mg by mouth 2 (two) times daily.        No current facility-administered medications on file prior to visit.    Review of Systems Patient denies any headache,  lightheadedness or dizziness.  No sinus or allergy symptoms.  No chest pain, tightness or palpitations.  No increased shortness of breath, cough or congestion.  Breathing stable.  No nausea or vomiting. Acid reflux controlled.  No abdominal pain or cramping.  No bowel change, such as diarrhea, constipation, or melana currently.  Previous bleeding.  See above. Had hemorrhoids.  Is s/p banding.  Doing well.  Has had full GI w/up.  No urine change.   Has adjusted her diet.  Lost some weight.       Objective:   Physical Exam  Filed Vitals:   12/26/12 1012  BP: 130/80  Pulse: 93  Temp: 98.5 F (36.9 C)   Blood pressure recheck:  140/74, pulse 51  79 year old female in no acute distress.   HEENT:  Nares- clear.  Oropharynx - without lesions. NECK:  Supple.  Nontender.  No audible bruit.  HEART:  Appears to be regular. LUNGS:  No crackles or wheezing audible.  Respirations even and unlabored.  RADIAL PULSE:  Equal bilaterally.  ABDOMEN:   Soft, nontender.  Bowel sounds present and normal.  No audible abdominal bruit.    EXTREMITIES:  No increased edema present.  DP pulses palpable and equal bilaterally.           Assessment & Plan:  HEALTH MAINTENANCE.  Physical 08/25/12.  She declined GU/pelvic exam.  Colonoscopy 09/21/11.  Recommended f/u 10 years.   Mammogram 03/30/12 - BiRads II.

## 2012-12-30 NOTE — Assessment & Plan Note (Signed)
Low cholesterol diet.  Lipid panel 5/14 revealed total cholesterol 207, triglycerides 109, HDL 58 and LDL 127.  Follow.

## 2012-12-30 NOTE — Assessment & Plan Note (Signed)
Low carb diet.  Follow.      

## 2012-12-30 NOTE — Assessment & Plan Note (Signed)
Symptoms controlled.  Follow.   

## 2012-12-30 NOTE — Assessment & Plan Note (Signed)
Has had extensive GI w/up.  Worked up by Dr Ma Hillock.  Has been released.  Follow cbc.  Hgb just checked 5/14 normal (13).

## 2012-12-30 NOTE — Assessment & Plan Note (Signed)
Breathing is stable.  Continues to follow up with Dr [name].

## 2013-01-08 DIAGNOSIS — K625 Hemorrhage of anus and rectum: Secondary | ICD-10-CM | POA: Diagnosis not present

## 2013-01-12 DIAGNOSIS — K648 Other hemorrhoids: Secondary | ICD-10-CM | POA: Diagnosis not present

## 2013-02-01 ENCOUNTER — Other Ambulatory Visit: Payer: Self-pay | Admitting: Internal Medicine

## 2013-02-01 NOTE — Telephone Encounter (Signed)
See other message

## 2013-02-01 NOTE — Telephone Encounter (Signed)
Ok to give her this, she is on 2 other inhalers and I do not see where it was prescribed by you

## 2013-02-01 NOTE — Telephone Encounter (Signed)
I thought she was getting her inhalers from Dr [name].  She sees him.  I don't mind refilling, but if he has been prescribing - should get from him.  Thanks.

## 2013-02-02 NOTE — Telephone Encounter (Signed)
Pt states that the pharmacy has already called & told her that the Rx was ready. I informed her that we did not send it in yet. She will call back on Monday if she still needs it. She stated that Dr. Vella Kohler is not refilling them for her.

## 2013-02-22 DIAGNOSIS — J449 Chronic obstructive pulmonary disease, unspecified: Secondary | ICD-10-CM | POA: Diagnosis not present

## 2013-04-24 ENCOUNTER — Encounter: Payer: Self-pay | Admitting: Internal Medicine

## 2013-04-24 ENCOUNTER — Other Ambulatory Visit: Payer: Self-pay | Admitting: Internal Medicine

## 2013-04-24 ENCOUNTER — Telehealth: Payer: Self-pay | Admitting: Internal Medicine

## 2013-04-24 ENCOUNTER — Ambulatory Visit (INDEPENDENT_AMBULATORY_CARE_PROVIDER_SITE_OTHER): Payer: Medicare Other | Admitting: Internal Medicine

## 2013-04-24 VITALS — BP 130/80 | HR 88 | Temp 97.7°F | Ht 63.25 in | Wt 173.0 lb

## 2013-04-24 DIAGNOSIS — D649 Anemia, unspecified: Secondary | ICD-10-CM | POA: Diagnosis not present

## 2013-04-24 DIAGNOSIS — K649 Unspecified hemorrhoids: Secondary | ICD-10-CM

## 2013-04-24 DIAGNOSIS — R739 Hyperglycemia, unspecified: Secondary | ICD-10-CM

## 2013-04-24 DIAGNOSIS — R7309 Other abnormal glucose: Secondary | ICD-10-CM | POA: Diagnosis not present

## 2013-04-24 DIAGNOSIS — E7800 Pure hypercholesterolemia, unspecified: Secondary | ICD-10-CM

## 2013-04-24 DIAGNOSIS — Z1239 Encounter for other screening for malignant neoplasm of breast: Secondary | ICD-10-CM | POA: Diagnosis not present

## 2013-04-24 DIAGNOSIS — I10 Essential (primary) hypertension: Secondary | ICD-10-CM

## 2013-04-24 DIAGNOSIS — K219 Gastro-esophageal reflux disease without esophagitis: Secondary | ICD-10-CM

## 2013-04-24 DIAGNOSIS — J45909 Unspecified asthma, uncomplicated: Secondary | ICD-10-CM

## 2013-04-24 DIAGNOSIS — K2270 Barrett's esophagus without dysplasia: Secondary | ICD-10-CM

## 2013-04-24 LAB — CBC WITH DIFFERENTIAL/PLATELET
Basophils Absolute: 0 10*3/uL (ref 0.0–0.1)
Basophils Relative: 0.4 % (ref 0.0–3.0)
Eosinophils Absolute: 0.2 10*3/uL (ref 0.0–0.7)
Eosinophils Relative: 3.7 % (ref 0.0–5.0)
HEMATOCRIT: 37 % (ref 36.0–46.0)
Hemoglobin: 12.1 g/dL (ref 12.0–15.0)
LYMPHS ABS: 1.1 10*3/uL (ref 0.7–4.0)
Lymphocytes Relative: 22.8 % (ref 12.0–46.0)
MCHC: 32.8 g/dL (ref 30.0–36.0)
MCV: 83.5 fl (ref 78.0–100.0)
Monocytes Absolute: 0.2 10*3/uL (ref 0.1–1.0)
Monocytes Relative: 5.2 % (ref 3.0–12.0)
Neutro Abs: 3.2 10*3/uL (ref 1.4–7.7)
Neutrophils Relative %: 67.9 % (ref 43.0–77.0)
Platelets: 193 10*3/uL (ref 150.0–400.0)
RBC: 4.43 Mil/uL (ref 3.87–5.11)
RDW: 15.5 % — AB (ref 11.5–14.6)
WBC: 4.7 10*3/uL (ref 4.5–10.5)

## 2013-04-24 LAB — COMPREHENSIVE METABOLIC PANEL
ALT: 17 U/L (ref 0–35)
AST: 24 U/L (ref 0–37)
Albumin: 4 g/dL (ref 3.5–5.2)
Alkaline Phosphatase: 62 U/L (ref 39–117)
BILIRUBIN TOTAL: 0.5 mg/dL (ref 0.3–1.2)
BUN: 13 mg/dL (ref 6–23)
CO2: 29 mEq/L (ref 19–32)
Calcium: 9.4 mg/dL (ref 8.4–10.5)
Chloride: 106 mEq/L (ref 96–112)
Creatinine, Ser: 1.1 mg/dL (ref 0.4–1.2)
GFR: 50.4 mL/min — ABNORMAL LOW (ref >60.00)
Glucose, Bld: 103 mg/dL — ABNORMAL HIGH (ref 70–99)
Potassium: 4 mEq/L (ref 3.5–5.1)
Sodium: 142 mEq/L (ref 135–145)
TOTAL PROTEIN: 6.5 g/dL (ref 6.0–8.3)

## 2013-04-24 LAB — LIPID PANEL
Cholesterol: 197 mg/dL (ref 0–200)
HDL: 53.9 mg/dL (ref >39.00)
LDL CALC: 114 mg/dL — AB (ref 0–99)
Total CHOL/HDL Ratio: 4
Triglycerides: 148 mg/dL (ref 0.0–149.0)
VLDL: 29.6 mg/dL (ref 0.0–40.0)

## 2013-04-24 LAB — HEMOGLOBIN A1C: Hgb A1c MFr Bld: 5.9 % (ref 4.6–6.5)

## 2013-04-24 LAB — FERRITIN: Ferritin: 27.2 ng/mL (ref 10.0–291.0)

## 2013-04-24 NOTE — Progress Notes (Signed)
Subjective:    Patient ID: Anita Ferrell, female    DOB: [birth year], 80 y.o.   MRN: [id number]  HPI 80 year old female with past history of hypertension, asthma, GERD and BARRETTS who comes in today for a scheduled follow up.  She states her breathing is stable.  Uses her inhalers regularly.  Follows with Dr [name].  Taking a stool softener.  Had a full GI w/up.  Is s/p EGD, colonoscopy and SBFT.  Was still having problems with hemorrhoids.  Saw Dr Tamala Julian.  Is s/p banding.  Dong well.   No nausea or vomiting.  Reflux controlled.  Blood pressure has been doing well.  Overall she feels things are stable.  Has tried to watch her diet.       Past Medical History  Diagnosis Date  . Allergy   . COPD (chronic obstructive pulmonary disease)   . Hypercholesteremia   . TB (pulmonary tuberculosis)     treated with INH therapy   . Pulmonary hypertension     Right Ventricular systolic pressure at 60 mm Hg by echo on july of 2011; Right heart cardic catherization revealed pulmonary artery pressusre of 27/10 with a mean of; Ventricular pressure 29/10 with pulmonary capillary wedge at 9  . GERD (gastroesophageal reflux disease)   . Hypertension     Current Outpatient Prescriptions on File Prior to Visit  Medication Sig Dispense Refill  . albuterol (PROVENTIL HFA;VENTOLIN HFA) 108 (90 BASE) MCG/ACT inhaler Inhale 2 puffs into the lungs every 6 (six) hours as needed.       Marland Kitchen amLODipine (NORVASC) 5 MG tablet Take 1 tablet (5 mg total) by mouth daily.  30 tablet  5  . aspirin EC 81 MG tablet Take 81 mg by mouth daily.       Marland Kitchen azelastine (ASTELIN) 137 MCG/SPRAY nasal spray Place 1 spray into the nose 2 (two) times daily. Use in each nostril as directed  30 mL  5  . budesonide-formoterol (SYMBICORT) 160-4.5 MCG/ACT inhaler Inhale 2 puffs into the lungs 2 (two) times daily.       . Calcium Carbonate-Vitamin D (CALTRATE 600+D PO) Take 600 mg by mouth daily.      [name] Sodium 30-100 MG CAPS  Take by mouth. Stool softner      . cholecalciferol (VITAMIN D) 400 UNITS TABS Take 1,000 Units by mouth daily.       . fexofenadine (ALLEGRA) 180 MG tablet Take 180 mg by mouth daily.       Marland Kitchen guaiFENesin (MUCINEX) 600 MG 12 hr tablet Take 600 mg by mouth daily.       . lansoprazole (PREVACID) 30 MG capsule Take 30 mg by mouth daily.       . Multiple Vitamin (MULTIVITAMIN) capsule Take 1 capsule by mouth daily.      . Multiple Vitamins-Iron (MULTIVITAMINS WITH IRON) TABS Take 1 tablet by mouth daily.      . predniSONE (DELTASONE) 5 MG tablet Take 5 mg by mouth daily.       Marland Kitchen SPIRIVA HANDIHALER 18 MCG inhalation capsule INHALE CONTENTS OF ONE CAPSULE ONCE A DAY AS DIRECTED  30 capsule  5  . theophylline (THEODUR) 200 MG 12 hr tablet Take 200 mg by mouth 2 (two) times daily.        No current facility-administered medications on file prior to visit.    Review of Systems Patient denies any headache, lightheadedness or dizziness.  No sinus or allergy symptoms.  No chest  pain, tightness or palpitations.  No increased shortness of breath, cough or congestion.  Breathing stable.  No nausea or vomiting. Acid reflux controlled.  No abdominal pain or cramping.  No bowel change, such as diarrhea, constipation, or melana currently.  Previous bleeding.  See above. Had hemorrhoids.  Is s/p banding.  Doing well.  Has had full GI w/up.  No urine change.   Has adjusted her diet.  Lost some weight.       Objective:   Physical Exam  Filed Vitals:   04/24/13 1024  BP: 130/80  Pulse: 88  Temp: 97.7 F (32.23 C)   80 year old female in no acute distress.   HEENT:  Nares- clear.  Oropharynx - without lesions. NECK:  Supple.  Nontender.  No audible bruit.  HEART:  Appears to be regular. LUNGS:  No crackles or wheezing audible.  Respirations even and unlabored.  RADIAL PULSE:  Equal bilaterally.  ABDOMEN:  Soft, nontender.  Bowel sounds present and normal.  No audible abdominal bruit.    EXTREMITIES:  No  increased edema present.  DP pulses palpable and equal bilaterally.           Assessment & Plan:  HEALTH MAINTENANCE.  Physical 08/25/12.  She declined GU/pelvic exam.  Colonoscopy 09/21/11.  Recommended f/u 10 years.   Mammogram 03/30/12 - BiRads II.  Schedule f/u mammogram.

## 2013-04-24 NOTE — Progress Notes (Signed)
Pre-visit discussion using our clinic review tool. No additional management support is needed unless otherwise documented below in the visit note.  

## 2013-04-24 NOTE — Telephone Encounter (Signed)
Pt in office.  Would like [place] to schedule her mammogram at [facility].  Would like Tuesday or Thursday any time of day.  May leave msg on mychart regarding appt date and time.

## 2013-04-24 NOTE — Progress Notes (Signed)
Orders placed for f/u labs.  

## 2013-04-25 ENCOUNTER — Encounter: Payer: Self-pay | Admitting: Internal Medicine

## 2013-04-29 ENCOUNTER — Encounter: Payer: Self-pay | Admitting: Internal Medicine

## 2013-04-29 NOTE — Assessment & Plan Note (Signed)
Low carb diet.  Follow.      

## 2013-04-29 NOTE — Assessment & Plan Note (Signed)
Low cholesterol diet.  Lipid panel 5/14 revealed total cholesterol 207, triglycerides 109, HDL 58 and LDL 127.  Follow.  Schedule f/u lipid panel check.

## 2013-04-29 NOTE — Assessment & Plan Note (Signed)
Blood pressure as outlined.  Outside checks under good control.  Same medication regimen.  Follow.   Follow metabolic panel.

## 2013-04-29 NOTE — Assessment & Plan Note (Signed)
S/p banding.  Doing well.

## 2013-04-29 NOTE — Assessment & Plan Note (Signed)
Symptoms controlled.  Follow.   

## 2013-04-29 NOTE — Assessment & Plan Note (Addendum)
Breathing is stable.  Continues to follow up with Dr [name].  Planning for cxr at next visit.

## 2013-04-29 NOTE — Assessment & Plan Note (Signed)
Has had extensive GI w/up.  Worked up by Dr Ma Hillock.  Has been released.  Follow cbc.  Hgb just checked 5/14 normal (13).

## 2013-04-29 NOTE — Assessment & Plan Note (Signed)
Symptoms controlled.  Continue to follow up with Dr Gustavo Lah for surveillance EGD.  Last EGD 09/21/11 - reflux esophagitis.  Recommended f/u EGD in three years.

## 2013-05-02 ENCOUNTER — Ambulatory Visit: Payer: Self-pay | Admitting: Internal Medicine

## 2013-05-02 DIAGNOSIS — Z1231 Encounter for screening mammogram for malignant neoplasm of breast: Secondary | ICD-10-CM | POA: Diagnosis not present

## 2013-05-02 LAB — HM MAMMOGRAPHY: HM MAMMO: NEGATIVE

## 2013-05-03 ENCOUNTER — Encounter: Payer: Self-pay | Admitting: Internal Medicine

## 2013-07-04 DIAGNOSIS — B359 Dermatophytosis, unspecified: Secondary | ICD-10-CM | POA: Diagnosis not present

## 2013-07-04 DIAGNOSIS — L821 Other seborrheic keratosis: Secondary | ICD-10-CM | POA: Diagnosis not present

## 2013-07-04 DIAGNOSIS — L219 Seborrheic dermatitis, unspecified: Secondary | ICD-10-CM | POA: Diagnosis not present

## 2013-08-16 DIAGNOSIS — L219 Seborrheic dermatitis, unspecified: Secondary | ICD-10-CM | POA: Diagnosis not present

## 2013-08-16 DIAGNOSIS — R21 Rash and other nonspecific skin eruption: Secondary | ICD-10-CM | POA: Diagnosis not present

## 2013-08-23 DIAGNOSIS — R0602 Shortness of breath: Secondary | ICD-10-CM | POA: Diagnosis not present

## 2013-08-23 DIAGNOSIS — J441 Chronic obstructive pulmonary disease with (acute) exacerbation: Secondary | ICD-10-CM | POA: Diagnosis not present

## 2013-08-23 LAB — PULMONARY FUNCTION TEST

## 2013-08-27 ENCOUNTER — Ambulatory Visit (INDEPENDENT_AMBULATORY_CARE_PROVIDER_SITE_OTHER): Payer: Medicare Other | Admitting: Internal Medicine

## 2013-08-27 ENCOUNTER — Encounter: Payer: Self-pay | Admitting: Internal Medicine

## 2013-08-27 VITALS — BP 130/80 | HR 97 | Temp 97.8°F | Ht 63.5 in | Wt 174.5 lb

## 2013-08-27 DIAGNOSIS — I10 Essential (primary) hypertension: Secondary | ICD-10-CM | POA: Diagnosis not present

## 2013-08-27 DIAGNOSIS — R739 Hyperglycemia, unspecified: Secondary | ICD-10-CM

## 2013-08-27 DIAGNOSIS — R7309 Other abnormal glucose: Secondary | ICD-10-CM

## 2013-08-27 DIAGNOSIS — J45909 Unspecified asthma, uncomplicated: Secondary | ICD-10-CM

## 2013-08-27 DIAGNOSIS — D649 Anemia, unspecified: Secondary | ICD-10-CM

## 2013-08-27 DIAGNOSIS — K2270 Barrett's esophagus without dysplasia: Secondary | ICD-10-CM

## 2013-08-27 DIAGNOSIS — K219 Gastro-esophageal reflux disease without esophagitis: Secondary | ICD-10-CM

## 2013-08-27 DIAGNOSIS — E7800 Pure hypercholesterolemia, unspecified: Secondary | ICD-10-CM | POA: Diagnosis not present

## 2013-08-27 LAB — BASIC METABOLIC PANEL
BUN: 12 mg/dL (ref 6–23)
CALCIUM: 9.5 mg/dL (ref 8.4–10.5)
CO2: 30 mEq/L (ref 19–32)
CREATININE: 1 mg/dL (ref 0.4–1.2)
Chloride: 103 mEq/L (ref 96–112)
GFR: 56.15 mL/min — AB (ref >60.00)
GLUCOSE: 86 mg/dL (ref 70–99)
Potassium: 3.9 mEq/L (ref 3.5–5.1)
SODIUM: 140 meq/L (ref 135–145)

## 2013-08-27 LAB — LIPID PANEL
Cholesterol: 201 mg/dL — ABNORMAL HIGH (ref 0–200)
HDL: 53.2 mg/dL (ref >39.00)
LDL Cholesterol: 118 mg/dL — ABNORMAL HIGH (ref 0–99)
Total CHOL/HDL Ratio: 4
Triglycerides: 147 mg/dL (ref 0.0–149.0)
VLDL: 29.4 mg/dL (ref 0.0–40.0)

## 2013-08-27 LAB — HEPATIC FUNCTION PANEL
ALT: 16 U/L (ref 0–35)
AST: 24 U/L (ref 0–37)
Albumin: 3.9 g/dL (ref 3.5–5.2)
Alkaline Phosphatase: 61 U/L (ref 39–117)
BILIRUBIN DIRECT: 0.1 mg/dL (ref 0.0–0.3)
BILIRUBIN TOTAL: 0.7 mg/dL (ref 0.2–1.2)
Total Protein: 6.6 g/dL (ref 6.0–8.3)

## 2013-08-27 LAB — HEMOGLOBIN A1C: Hgb A1c MFr Bld: 6 % (ref 4.6–6.5)

## 2013-08-27 NOTE — Progress Notes (Signed)
Pre visit review using our clinic review tool, if applicable. No additional management support is needed unless otherwise documented below in the visit note. 

## 2013-08-27 NOTE — Progress Notes (Signed)
Subjective:    Patient ID: Anita Ferrell, female    DOB: [birth year], 80 y.o.   MRN: [id number]  HPI 80 year old female with past history of hypertension, asthma, GERD and BARRETTS who comes in today to follow up on these issues as well as for a complete physical exam.   She states her breathing is stable.  Uses her inhalers regularly.  Follows with Dr [name].  Just evaluated.  Had to change from symbicort to advair secondary to insurance.  Feels her breathing is doing well.  Had a full GI w/up.  Is s/p EGD, colonoscopy and SBFT.  Was still having problems with hemorrhoids.  Saw Dr Tamala Julian.  Is s/p banding.  No problems reported today.  No nausea or vomiting.  Reflux controlled.  Blood pressure has been doing well.  Overall she feels things are stable.       Past Medical History  Diagnosis Date  . Allergy   . COPD (chronic obstructive pulmonary disease)   . Hypercholesteremia   . TB (pulmonary tuberculosis)     treated with INH therapy   . Pulmonary hypertension     Right Ventricular systolic pressure at 60 mm Hg by echo on july of 2011; Right heart cardic catherization revealed pulmonary artery pressusre of 27/10 with a mean of; Ventricular pressure 29/10 with pulmonary capillary wedge at 9  . GERD (gastroesophageal reflux disease)   . Hypertension     Current Outpatient Prescriptions on File Prior to Visit  Medication Sig Dispense Refill  . albuterol (PROVENTIL HFA;VENTOLIN HFA) 108 (90 BASE) MCG/ACT inhaler Inhale 2 puffs into the lungs every 6 (six) hours as needed.       Marland Kitchen amLODipine (NORVASC) 5 MG tablet Take 1 tablet (5 mg total) by mouth daily.  30 tablet  5  . aspirin EC 81 MG tablet Take 81 mg by mouth daily.       Marland Kitchen azelastine (ASTELIN) 137 MCG/SPRAY nasal spray Place 1 spray into the nose 2 (two) times daily. Use in each nostril as directed  30 mL  5  . Calcium Carbonate-Vitamin D (CALTRATE 600+D PO) Take 600 mg by mouth daily.      [name] Sodium 30-100 MG  CAPS Take by mouth. Stool softner      . cholecalciferol (VITAMIN D) 400 UNITS TABS Take 1,000 Units by mouth daily.       . fexofenadine (ALLEGRA) 180 MG tablet Take 180 mg by mouth daily.       Marland Kitchen guaiFENesin (MUCINEX) 600 MG 12 hr tablet Take 600 mg by mouth daily.       . lansoprazole (PREVACID) 30 MG capsule Take 30 mg by mouth daily.       . Multiple Vitamin (MULTIVITAMIN) capsule Take 1 capsule by mouth daily.      . Multiple Vitamins-Iron (MULTIVITAMINS WITH IRON) TABS Take 1 tablet by mouth daily.      . predniSONE (DELTASONE) 5 MG tablet Take 5 mg by mouth daily.       Marland Kitchen SPIRIVA HANDIHALER 18 MCG inhalation capsule INHALE CONTENTS OF ONE CAPSULE ONCE A DAY AS DIRECTED  30 capsule  5  . theophylline (THEODUR) 200 MG 12 hr tablet Take 200 mg by mouth 2 (two) times daily.        No current facility-administered medications on file prior to visit.    Review of Systems Patient denies any headache, lightheadedness or dizziness.  No sinus or allergy symptoms.  No chest pain, tightness  or palpitations.  No increased shortness of breath, cough or congestion.  Breathing stable.  No nausea or vomiting. Acid reflux controlled.  No abdominal pain or cramping.  No bowel change, such as diarrhea, constipation, or melana currently.  Had hemorrhoids.  Is s/p banding.  Doing well.  Has had full GI w/up.  No urine change.       Objective:   Physical Exam  Filed Vitals:   08/27/13 1046  BP: 130/80  Pulse: 97  Temp: 97.8 F (36.6 C)   Blood pressure recheck:  36/58  80 year old female in no acute distress.   HEENT:  Nares- clear.  Oropharynx - without lesions. NECK:  Supple.  Nontender.  No audible bruit.  HEART:  Appears to be regular. LUNGS:  No crackles or wheezing audible.  Respirations even and unlabored.  RADIAL PULSE:  Equal bilaterally.    BREASTS:  No nipple discharge or nipple retraction present.  Could not appreciate any distinct nodules or axillary adenopathy.  ABDOMEN:  Soft,  nontender.  Bowel sounds present and normal.  No audible abdominal bruit.  GU:  Not performed.     EXTREMITIES:  No increased edema present.  DP pulses palpable and equal bilaterally.           Assessment & Plan:  HEALTH MAINTENANCE.  Physical today.  Colonoscopy 09/21/11.  Recommended f/u 10 years.   Mammogram 05/02/13 - BiRads I.   I spent 25 minutes with the patient and more than 50% of the time was spent in consultation regarding the above.

## 2013-08-28 ENCOUNTER — Encounter: Payer: Self-pay | Admitting: Internal Medicine

## 2013-08-28 NOTE — Assessment & Plan Note (Signed)
Symptoms controlled.  Continue to follow up with Dr Gustavo Lah for surveillance EGD.  Last EGD 09/21/11 - reflux esophagitis.  Recommended f/u EGD in three years.

## 2013-08-28 NOTE — Assessment & Plan Note (Signed)
Breathing is stable.  Continues to follow up with Dr [name].  Just evaluated.  Just had cxr.  Doing well.

## 2013-08-28 NOTE — Assessment & Plan Note (Signed)
Low cholesterol diet.   Follow.

## 2013-08-28 NOTE — Assessment & Plan Note (Signed)
Symptoms controlled.  Follow.   

## 2013-08-28 NOTE — Assessment & Plan Note (Signed)
Blood pressure as outlined.  Outside checks under good control.  Same medication regimen.  Follow.   Follow metabolic panel.

## 2013-08-28 NOTE — Assessment & Plan Note (Signed)
Has had extensive GI w/up.  Worked up by Dr Ma Hillock.  Has been released.  Follow cbc.

## 2013-08-28 NOTE — Assessment & Plan Note (Signed)
Low carb diet.  Follow.      

## 2014-01-12 ENCOUNTER — Ambulatory Visit (INDEPENDENT_AMBULATORY_CARE_PROVIDER_SITE_OTHER): Payer: Medicare Other

## 2014-01-12 DIAGNOSIS — Z23 Encounter for immunization: Secondary | ICD-10-CM

## 2014-03-05 ENCOUNTER — Encounter: Payer: Self-pay | Admitting: Internal Medicine

## 2014-03-05 ENCOUNTER — Ambulatory Visit (INDEPENDENT_AMBULATORY_CARE_PROVIDER_SITE_OTHER): Payer: Medicare Other | Admitting: Internal Medicine

## 2014-03-05 VITALS — BP 151/79 | HR 97 | Temp 97.6°F | Ht 63.5 in | Wt 175.5 lb

## 2014-03-05 DIAGNOSIS — J4520 Mild intermittent asthma, uncomplicated: Secondary | ICD-10-CM

## 2014-03-05 DIAGNOSIS — R739 Hyperglycemia, unspecified: Secondary | ICD-10-CM

## 2014-03-05 DIAGNOSIS — E669 Obesity, unspecified: Secondary | ICD-10-CM

## 2014-03-05 DIAGNOSIS — D649 Anemia, unspecified: Secondary | ICD-10-CM | POA: Diagnosis not present

## 2014-03-05 DIAGNOSIS — K2100 Gastro-esophageal reflux disease with esophagitis, without bleeding: Secondary | ICD-10-CM

## 2014-03-05 DIAGNOSIS — K2270 Barrett's esophagus without dysplasia: Secondary | ICD-10-CM

## 2014-03-05 DIAGNOSIS — I10 Essential (primary) hypertension: Secondary | ICD-10-CM | POA: Diagnosis not present

## 2014-03-05 DIAGNOSIS — R161 Splenomegaly, not elsewhere classified: Secondary | ICD-10-CM

## 2014-03-05 DIAGNOSIS — E7800 Pure hypercholesterolemia, unspecified: Secondary | ICD-10-CM

## 2014-03-05 DIAGNOSIS — K625 Hemorrhage of anus and rectum: Secondary | ICD-10-CM

## 2014-03-05 LAB — CBC WITH DIFFERENTIAL/PLATELET
BASOS PCT: 0.4 % (ref 0.0–3.0)
Basophils Absolute: 0 10*3/uL (ref 0.0–0.1)
Eosinophils Absolute: 0.2 10*3/uL (ref 0.0–0.7)
Eosinophils Relative: 4.3 % (ref 0.0–5.0)
HEMATOCRIT: 39.2 % (ref 36.0–46.0)
HEMOGLOBIN: 12.4 g/dL (ref 12.0–15.0)
LYMPHS ABS: 1.6 10*3/uL (ref 0.7–4.0)
Lymphocytes Relative: 31 % (ref 12.0–46.0)
MCHC: 31.6 g/dL (ref 30.0–36.0)
MCV: 85.3 fl (ref 78.0–100.0)
MONO ABS: 0.4 10*3/uL (ref 0.1–1.0)
Monocytes Relative: 8.3 % (ref 3.0–12.0)
NEUTROS ABS: 2.8 10*3/uL (ref 1.4–7.7)
Neutrophils Relative %: 56 % (ref 43.0–77.0)
Platelets: 202 10*3/uL (ref 150.0–400.0)
RBC: 4.6 Mil/uL (ref 3.87–5.11)
RDW: 15 % (ref 11.5–15.5)
WBC: 5.1 10*3/uL (ref 4.0–10.5)

## 2014-03-05 LAB — HEMOGLOBIN A1C: Hgb A1c MFr Bld: 5.8 % (ref 4.6–6.5)

## 2014-03-05 LAB — FERRITIN: Ferritin: 32.6 ng/mL (ref 10.0–291.0)

## 2014-03-05 LAB — TSH: TSH: 0.75 u[IU]/mL (ref 0.35–4.50)

## 2014-03-05 MED ORDER — FLUTICASONE-SALMETEROL 500-50 MCG/DOSE IN AEPB: 1.0000 | INHALATION_SPRAY | Freq: Two times a day (BID) | RESPIRATORY_TRACT | Status: DC

## 2014-03-05 NOTE — Progress Notes (Signed)
Pre visit review using our clinic review tool, if applicable. No additional management support is needed unless otherwise documented below in the visit note. 

## 2014-03-05 NOTE — Progress Notes (Signed)
Subjective:    Patient ID: Anita Ferrell, female    DOB: [birth year], 80 y.o.   MRN: [id number]  HPI 80 year old female with past history of hypertension, asthma, GERD and BARRETTS who comes in today for a scheduled follow up.  She reports noticing being more sob with exertion.  This has been gradually worsening.  Some cough.  Last saw Dr [name] 07/2013.  Uses her inhalers regularly.  Had to change from symbicort to advair secondary to insurance.  Feels the advair works for her.  Denies any sinus pressure.  Some increased drainage.  Taking mucinex.  No chest congestion.  no significant acid reflux.  Has noticed some rectal bleeding again.  Has had hemorrhoids evaluated previously.  States will notice most days.   Had a full GI w/up.  Is s/p EGD, colonoscopy and SBFT.  Previously saw Dr Tamala Julian.  Is s/p banding.  Discussed referral back to Dr Tamala Julian.  She request referral to GI first.  N No nausea or vomiting.  Blood pressure has been doing well.  She does request f/u ultrasound regarding the splenomegaly.       Past Medical History  Diagnosis Date  . Allergy   . COPD (chronic obstructive pulmonary disease)   . Hypercholesteremia   . TB (pulmonary tuberculosis)     treated with INH therapy   . Pulmonary hypertension     Right Ventricular systolic pressure at 60 mm Hg by echo on july of 2011; Right heart cardic catherization revealed pulmonary artery pressusre of 27/10 with a mean of; Ventricular pressure 29/10 with pulmonary capillary wedge at 9  . GERD (gastroesophageal reflux disease)   . Hypertension     Current Outpatient Prescriptions on File Prior to Visit  Medication Sig Dispense Refill  . albuterol (PROVENTIL HFA;VENTOLIN HFA) 108 (90 BASE) MCG/ACT inhaler Inhale 2 puffs into the lungs every 6 (six) hours as needed.     Marland Kitchen amLODipine (NORVASC) 5 MG tablet Take 1 tablet (5 mg total) by mouth daily. 30 tablet 5  . aspirin EC 81 MG tablet Take 81 mg by mouth daily.     Marland Kitchen azelastine  (ASTELIN) 137 MCG/SPRAY nasal spray Place 1 spray into the nose 2 (two) times daily. Use in each nostril as directed 30 mL 5  . Calcium Carbonate-Vitamin D (CALTRATE 600+D PO) Take 600 mg by mouth daily.    [name] Sodium 30-100 MG CAPS Take by mouth. Stool softner    . cholecalciferol (VITAMIN D) 400 UNITS TABS Take 1,000 Units by mouth daily.     . fexofenadine (ALLEGRA) 180 MG tablet Take 180 mg by mouth daily.     . Fluticasone-Salmeterol (ADVAIR DISKUS) 250-50 MCG/DOSE AEPB Inhale 1 puff into the lungs 2 (two) times daily.    Marland Kitchen guaiFENesin (MUCINEX) 600 MG 12 hr tablet Take 600 mg by mouth daily.     . lansoprazole (PREVACID) 30 MG capsule Take 30 mg by mouth daily.     . Multiple Vitamins-Iron (MULTIVITAMINS WITH IRON) TABS Take 1 tablet by mouth daily.    . predniSONE (DELTASONE) 5 MG tablet Take 5 mg by mouth daily.     Marland Kitchen SPIRIVA HANDIHALER 18 MCG inhalation capsule INHALE CONTENTS OF ONE CAPSULE ONCE A DAY AS DIRECTED 30 capsule 5  . theophylline (THEODUR) 200 MG 12 hr tablet Take 200 mg by mouth 2 (two) times daily.      No current facility-administered medications on file prior to visit.    Review  of Systems Patient denies any headache, lightheadedness or dizziness.  No sinus pressure.  Some increased drainage.  No chest pain, tightness or palpitations.  Does report some notice of increased shortness of breath.  No chest congestion.  No significant cough.  No nausea or vomiting.  Acid reflux controlled.  No abdominal pain or cramping.  No bowel change, such as diarrhea, constipation, or melana currently.  Had hemorrhoids.  Is s/p banding.  Noticing bleeding daily.   Has had full GI w/up.  Request referral back as outlined.   No urine change.       Objective:   Physical Exam  Filed Vitals:   03/05/14 0956  BP: 151/79  Pulse: 97  Temp: 97.6 F (36.4 C)   Blood pressure recheck:  15-78/26  80 year old female in no acute distress.   HEENT:  Nares- clear.   Oropharynx - without lesions. NECK:  Supple.  Nontender.  No audible bruit.  HEART:  Appears to be regular. LUNGS:  No crackles or wheezing audible.  Respirations even and unlabored.  RADIAL PULSE:  Equal bilaterally. ABDOMEN:  Soft, nontender.  Bowel sounds present and normal.  No audible abdominal bruit.  EXTREMITIES:  No increased edema present.  DP pulses palpable and equal bilaterally.           Assessment & Plan:  1. Essential hypertension Blood pressure has been doing well.  Follow.  Same medications.  - Basic metabolic panel  2. Asthma, chronic, mild intermittent, uncomplicated Has noticed some sob with exertion.  Appears to be gradually worsening.  Continue inhalers - regular use.   - Ambulatory referral to Pulmonology.  She sees Dr [name].   3. Gastroesophageal reflux disease with esophagitis Appears to be controlled.  Continue prevacid.   4. Barrett's esophagus Symptoms controlled.  Continue to f/u with Dr Gustavo Lah.  Last EGD 09/21/11.  Recommended f/u EGD in 3 years.    5. Hypercholesterolemia Low cholesterol diet and exercise as tolerated.  Follow.   - Lipid panel - Hepatic function panel Lab Results  Component Value Date   CHOL 204* 03/05/2014   HDL 58.20 03/05/2014   LDLCALC 118* 03/05/2014   LDLDIRECT 127.1 08/25/2012   TRIG 137.0 03/05/2014   CHOLHDL 4 03/05/2014   6. Anemia, unspecified anemia type - CBC with Differential - Ferritin - TSH Refer back to GI for the rectal bleeding.  See above.  Pt requested referral to GI.  Has been evaluated by Dr Tamala Julian for hemorrhoids.    7. Hyperglycemia Low carb diet.   - Hemoglobin A1c Lab Results  Component Value Date   HGBA1C 5.8 03/05/2014   8. Splenomegaly States due f/u ultrasound.   - US Abdomen Complete; Future  9. Rectal bleeding See above for details regarding GI w/up.  Has seen Dr Tamala Julian for hemorrhoids previously.  Request referral to GI prior to seeing Dr Tamala Julian.   - Ambulatory referral to  Gastroenterology  10. Obesity (BMI 30-39.9) Diet and exercise.    HEALTH MAINTENANCE.  Physical 08/27/13  Colonoscopy 09/21/11.  Recommended f/u 10 years.   Mammogram 05/02/13 - BiRads I.   I spent 25 minutes with the patient and more than 50% of the time was spent in consultation regarding the above.

## 2014-03-06 ENCOUNTER — Other Ambulatory Visit: Payer: Self-pay | Admitting: Internal Medicine

## 2014-03-06 DIAGNOSIS — J310 Chronic rhinitis: Secondary | ICD-10-CM | POA: Diagnosis not present

## 2014-03-06 DIAGNOSIS — E870 Hyperosmolality and hypernatremia: Secondary | ICD-10-CM

## 2014-03-06 DIAGNOSIS — J449 Chronic obstructive pulmonary disease, unspecified: Secondary | ICD-10-CM | POA: Diagnosis not present

## 2014-03-06 DIAGNOSIS — N289 Disorder of kidney and ureter, unspecified: Secondary | ICD-10-CM

## 2014-03-06 LAB — BASIC METABOLIC PANEL
BUN: 12 mg/dL (ref 6–23)
CHLORIDE: 109 meq/L (ref 96–112)
CO2: 24 meq/L (ref 19–32)
Calcium: 9.6 mg/dL (ref 8.4–10.5)
Creatinine, Ser: 1.2 mg/dL (ref 0.4–1.2)
GFR: 45.52 mL/min — ABNORMAL LOW (ref >60.00)
GLUCOSE: 96 mg/dL (ref 70–99)
POTASSIUM: 3.9 meq/L (ref 3.5–5.1)
Sodium: 147 mEq/L — ABNORMAL HIGH (ref 135–145)

## 2014-03-06 LAB — LIPID PANEL
CHOL/HDL RATIO: 4
Cholesterol: 204 mg/dL — ABNORMAL HIGH (ref 0–200)
HDL: 58.2 mg/dL (ref >39.00)
LDL CALC: 118 mg/dL — AB (ref 0–99)
NonHDL: 145.8
Triglycerides: 137 mg/dL (ref 0.0–149.0)
VLDL: 27.4 mg/dL (ref 0.0–40.0)

## 2014-03-06 LAB — HEPATIC FUNCTION PANEL
ALT: 13 U/L (ref 0–35)
AST: 21 U/L (ref 0–37)
Albumin: 4.1 g/dL (ref 3.5–5.2)
Alkaline Phosphatase: 66 U/L (ref 39–117)
BILIRUBIN DIRECT: 0.1 mg/dL (ref 0.0–0.3)
BILIRUBIN TOTAL: 0.6 mg/dL (ref 0.2–1.2)
Total Protein: 6.6 g/dL (ref 6.0–8.3)

## 2014-03-06 NOTE — Progress Notes (Signed)
Left message on VM to return call 

## 2014-03-06 NOTE — Progress Notes (Signed)
Order placed for f/u labs.  

## 2014-03-10 ENCOUNTER — Encounter: Payer: Self-pay | Admitting: Internal Medicine

## 2014-03-10 DIAGNOSIS — E669 Obesity, unspecified: Secondary | ICD-10-CM | POA: Insufficient documentation

## 2014-03-12 ENCOUNTER — Other Ambulatory Visit (INDEPENDENT_AMBULATORY_CARE_PROVIDER_SITE_OTHER): Payer: Medicare Other

## 2014-03-12 DIAGNOSIS — E870 Hyperosmolality and hypernatremia: Secondary | ICD-10-CM

## 2014-03-12 DIAGNOSIS — N289 Disorder of kidney and ureter, unspecified: Secondary | ICD-10-CM | POA: Diagnosis not present

## 2014-03-12 LAB — BASIC METABOLIC PANEL
BUN: 13 mg/dL (ref 6–23)
CO2: 28 meq/L (ref 19–32)
Calcium: 9.2 mg/dL (ref 8.4–10.5)
Chloride: 104 mEq/L (ref 96–112)
Creatinine, Ser: 1.1 mg/dL (ref 0.4–1.2)
GFR: 49.77 mL/min — ABNORMAL LOW (ref >60.00)
GLUCOSE: 97 mg/dL (ref 70–99)
POTASSIUM: 3.7 meq/L (ref 3.5–5.1)
Sodium: 141 mEq/L (ref 135–145)

## 2014-03-13 ENCOUNTER — Ambulatory Visit: Payer: Self-pay | Admitting: Internal Medicine

## 2014-03-13 ENCOUNTER — Encounter: Payer: Self-pay | Admitting: Internal Medicine

## 2014-03-13 ENCOUNTER — Telehealth: Payer: Self-pay | Admitting: Internal Medicine

## 2014-03-13 DIAGNOSIS — Z9049 Acquired absence of other specified parts of digestive tract: Secondary | ICD-10-CM | POA: Diagnosis not present

## 2014-03-13 DIAGNOSIS — R161 Splenomegaly, not elsewhere classified: Secondary | ICD-10-CM | POA: Diagnosis not present

## 2014-03-13 DIAGNOSIS — R1084 Generalized abdominal pain: Secondary | ICD-10-CM | POA: Diagnosis not present

## 2014-03-13 DIAGNOSIS — N200 Calculus of kidney: Secondary | ICD-10-CM | POA: Diagnosis not present

## 2014-03-13 NOTE — Telephone Encounter (Signed)
Pt notified of abdominal US results via my chart.  Splenomegaly - stable.  Left nephrolithiasis.

## 2014-03-27 ENCOUNTER — Encounter: Payer: Self-pay | Admitting: Internal Medicine

## 2014-04-17 DIAGNOSIS — R0609 Other forms of dyspnea: Secondary | ICD-10-CM | POA: Diagnosis not present

## 2014-04-17 DIAGNOSIS — J449 Chronic obstructive pulmonary disease, unspecified: Secondary | ICD-10-CM | POA: Diagnosis not present

## 2014-04-17 DIAGNOSIS — J310 Chronic rhinitis: Secondary | ICD-10-CM | POA: Diagnosis not present

## 2014-05-02 DIAGNOSIS — K6289 Other specified diseases of anus and rectum: Secondary | ICD-10-CM | POA: Diagnosis not present

## 2014-05-02 DIAGNOSIS — K625 Hemorrhage of anus and rectum: Secondary | ICD-10-CM | POA: Diagnosis not present

## 2014-05-02 DIAGNOSIS — Z8719 Personal history of other diseases of the digestive system: Secondary | ICD-10-CM | POA: Diagnosis not present

## 2014-05-02 DIAGNOSIS — K649 Unspecified hemorrhoids: Secondary | ICD-10-CM | POA: Diagnosis not present

## 2014-05-07 ENCOUNTER — Ambulatory Visit: Payer: Self-pay | Admitting: Gastroenterology

## 2014-05-07 DIAGNOSIS — Z7982 Long term (current) use of aspirin: Secondary | ICD-10-CM | POA: Diagnosis not present

## 2014-05-07 DIAGNOSIS — K2960 Other gastritis without bleeding: Secondary | ICD-10-CM | POA: Diagnosis not present

## 2014-05-07 DIAGNOSIS — I10 Essential (primary) hypertension: Secondary | ICD-10-CM | POA: Diagnosis not present

## 2014-05-07 DIAGNOSIS — E669 Obesity, unspecified: Secondary | ICD-10-CM | POA: Diagnosis not present

## 2014-05-07 DIAGNOSIS — J449 Chronic obstructive pulmonary disease, unspecified: Secondary | ICD-10-CM | POA: Diagnosis not present

## 2014-05-07 DIAGNOSIS — K219 Gastro-esophageal reflux disease without esophagitis: Secondary | ICD-10-CM | POA: Diagnosis not present

## 2014-05-07 DIAGNOSIS — J45909 Unspecified asthma, uncomplicated: Secondary | ICD-10-CM | POA: Diagnosis not present

## 2014-05-07 DIAGNOSIS — Z7952 Long term (current) use of systemic steroids: Secondary | ICD-10-CM | POA: Diagnosis not present

## 2014-05-07 DIAGNOSIS — K644 Residual hemorrhoidal skin tags: Secondary | ICD-10-CM | POA: Diagnosis not present

## 2014-05-07 DIAGNOSIS — D125 Benign neoplasm of sigmoid colon: Secondary | ICD-10-CM | POA: Diagnosis not present

## 2014-05-07 DIAGNOSIS — K621 Rectal polyp: Secondary | ICD-10-CM | POA: Diagnosis not present

## 2014-05-07 DIAGNOSIS — D128 Benign neoplasm of rectum: Secondary | ICD-10-CM | POA: Diagnosis not present

## 2014-05-07 DIAGNOSIS — K648 Other hemorrhoids: Secondary | ICD-10-CM | POA: Diagnosis not present

## 2014-05-07 DIAGNOSIS — K625 Hemorrhage of anus and rectum: Secondary | ICD-10-CM | POA: Diagnosis not present

## 2014-05-07 DIAGNOSIS — Z880 Allergy status to penicillin: Secondary | ICD-10-CM | POA: Diagnosis not present

## 2014-05-07 DIAGNOSIS — D123 Benign neoplasm of transverse colon: Secondary | ICD-10-CM | POA: Diagnosis not present

## 2014-05-07 DIAGNOSIS — K635 Polyp of colon: Secondary | ICD-10-CM | POA: Diagnosis not present

## 2014-05-07 DIAGNOSIS — K640 First degree hemorrhoids: Secondary | ICD-10-CM | POA: Diagnosis not present

## 2014-05-07 DIAGNOSIS — K2270 Barrett's esophagus without dysplasia: Secondary | ICD-10-CM | POA: Diagnosis not present

## 2014-05-07 DIAGNOSIS — K5730 Diverticulosis of large intestine without perforation or abscess without bleeding: Secondary | ICD-10-CM | POA: Diagnosis not present

## 2014-05-07 LAB — HM COLONOSCOPY

## 2014-05-15 ENCOUNTER — Encounter: Payer: Self-pay | Admitting: *Deleted

## 2014-05-28 ENCOUNTER — Ambulatory Visit (INDEPENDENT_AMBULATORY_CARE_PROVIDER_SITE_OTHER): Payer: Medicare Other | Admitting: Internal Medicine

## 2014-05-28 ENCOUNTER — Encounter: Payer: Self-pay | Admitting: Internal Medicine

## 2014-05-28 VITALS — BP 152/84 | HR 97 | Temp 97.5°F | Ht 63.5 in | Wt 171.8 lb

## 2014-05-28 DIAGNOSIS — K2100 Gastro-esophageal reflux disease with esophagitis, without bleeding: Secondary | ICD-10-CM

## 2014-05-28 DIAGNOSIS — I10 Essential (primary) hypertension: Secondary | ICD-10-CM | POA: Diagnosis not present

## 2014-05-28 DIAGNOSIS — Z0000 Encounter for general adult medical examination without abnormal findings: Secondary | ICD-10-CM | POA: Diagnosis not present

## 2014-05-28 DIAGNOSIS — R161 Splenomegaly, not elsewhere classified: Secondary | ICD-10-CM

## 2014-05-28 DIAGNOSIS — N200 Calculus of kidney: Secondary | ICD-10-CM

## 2014-05-28 DIAGNOSIS — R739 Hyperglycemia, unspecified: Secondary | ICD-10-CM

## 2014-05-28 DIAGNOSIS — E7800 Pure hypercholesterolemia, unspecified: Secondary | ICD-10-CM

## 2014-05-28 DIAGNOSIS — D649 Anemia, unspecified: Secondary | ICD-10-CM

## 2014-05-28 DIAGNOSIS — E669 Obesity, unspecified: Secondary | ICD-10-CM

## 2014-05-28 DIAGNOSIS — K649 Unspecified hemorrhoids: Secondary | ICD-10-CM | POA: Diagnosis not present

## 2014-05-28 NOTE — Progress Notes (Signed)
Patient ID: Anita Ferrell, female   DOB: July 28, 1932, 81 y.o.   MRN: [id number]   Subjective:    Patient ID: Anita Ferrell, female    DOB: 1932/08/14, 81 y.o.   MRN: [id number]  HPI  Patient here for a scheduled follow up.  Overall she feels she is doing well.  Saw GI -05/07/14 - colonoscopy tubular adenoma.  Due f/u 04/2019.  Breathing stable.  Blood pressure on her checks have been doing well.  States systolic readings averaging 130s.  No cardiac symptoms with increased activity or exertion.  Still some issues with hemorrhoids.  Bleeds intermittently.  Plans to f/u with GI about this on 06/13/14.     Past Medical History  Diagnosis Date  . Allergy   . COPD (chronic obstructive pulmonary disease)   . Hypercholesteremia   . TB (pulmonary tuberculosis)     treated with INH therapy   . Pulmonary hypertension     Right Ventricular systolic pressure at 60 mm Hg by echo on july of 2011; Right heart cardic catherization revealed pulmonary artery pressusre of 27/10 with a mean of; Ventricular pressure 29/10 with pulmonary capillary wedge at 9  . GERD (gastroesophageal reflux disease)   . Hypertension     Current Outpatient Prescriptions on File Prior to Visit  Medication Sig Dispense Refill  . albuterol (PROVENTIL HFA;VENTOLIN HFA) 108 (90 BASE) MCG/ACT inhaler Inhale 2 puffs into the lungs every 6 (six) hours as needed.     Marland Kitchen amLODipine (NORVASC) 5 MG tablet Take 1 tablet (5 mg total) by mouth daily. 30 tablet 5  . aspirin EC 81 MG tablet Take 81 mg by mouth daily.     Marland Kitchen azelastine (ASTELIN) 137 MCG/SPRAY nasal spray Place 1 spray into the nose 2 (two) times daily. Use in each nostril as directed 30 mL 5  . Calcium Carbonate-Vitamin D (CALTRATE 600+D PO) Take 600 mg by mouth daily.    [name] Sodium 30-100 MG CAPS Take by mouth. Stool softner    . cholecalciferol (VITAMIN D) 400 UNITS TABS Take 1,000 Units by mouth daily.     . fexofenadine (ALLEGRA) 180 MG tablet Take  180 mg by mouth daily.     . Fluticasone-Salmeterol (ADVAIR DISKUS) 500-50 MCG/DOSE AEPB Inhale 1 puff into the lungs 2 (two) times daily. 60 each 0  . guaiFENesin (MUCINEX) 600 MG 12 hr tablet Take 600 mg by mouth 2 (two) times daily.     . lansoprazole (PREVACID) 30 MG capsule Take 30 mg by mouth daily.     . Multiple Vitamins-Iron (MULTIVITAMINS WITH IRON) TABS Take 1 tablet by mouth daily.    . predniSONE (DELTASONE) 5 MG tablet Take 5 mg by mouth daily.     Marland Kitchen SPIRIVA HANDIHALER 18 MCG inhalation capsule INHALE CONTENTS OF ONE CAPSULE ONCE A DAY AS DIRECTED 30 capsule 5  . theophylline (THEODUR) 200 MG 12 hr tablet Take 200 mg by mouth 2 (two) times daily.      No current facility-administered medications on file prior to visit.    Review of Systems  Constitutional: Negative for appetite change and unexpected weight change.  HENT: Negative for congestion and sinus pressure.   Respiratory: Negative for cough, chest tightness and shortness of breath.   Cardiovascular: Negative for chest pain, palpitations and leg swelling.  Gastrointestinal: Negative for nausea, vomiting, abdominal pain and diarrhea.       Still some intermittently flares with hemorrhoids.    Musculoskeletal: Negative for back pain  and joint swelling.  Skin: Negative for color change and rash.  Neurological: Negative for dizziness, light-headedness and headaches.       Objective:    Physical Exam  HENT:  Nose: Nose normal.  Mouth/Throat: Oropharynx is clear and moist.  Neck: Neck supple. No thyromegaly present.  Cardiovascular: Normal rate and regular rhythm.   Pulmonary/Chest: Breath sounds normal. No respiratory distress. She has no wheezes.  Abdominal: Soft. Bowel sounds are normal. There is no tenderness.  Musculoskeletal: She exhibits no edema or tenderness.  Lymphadenopathy:    She has no cervical adenopathy.    BP 152/84 mmHg  Pulse 97  Temp(Src) 97.5 F (36.4 C) (Oral)  Ht 5' 3.5" (1.613 m)  Wt  171 lb 12 oz (77.905 kg)  BMI 29.94 kg/m2  SpO2 95% Wt Readings from Last 3 Encounters:  05/28/14 171 lb 12 oz (77.905 kg)  03/05/14 175 lb 8 oz (79.606 kg)  08/27/13 174 lb 8 oz (79.153 kg)     Lab Results  Component Value Date   WBC 5.1 03/05/2014   HGB 12.4 03/05/2014   HCT 39.2 03/05/2014   PLT 202.0 03/05/2014   GLUCOSE 97 03/12/2014   CHOL 204* 03/05/2014   TRIG 137.0 03/05/2014   HDL 58.20 03/05/2014   LDLDIRECT 127.1 08/25/2012   LDLCALC 118* 03/05/2014   ALT 13 03/05/2014   AST 21 03/05/2014   NA 141 03/12/2014   K 3.7 03/12/2014   CL 104 03/12/2014   CREATININE 1.1 03/12/2014   BUN 13 03/12/2014   CO2 28 03/12/2014   TSH 0.75 03/05/2014   HGBA1C 5.8 03/05/2014       Assessment & Plan:   Problem List Items Addressed This Visit    Anemia    Has had extensive GI w/up.  Also worked up by Dr Ma Hillock.  Has been released.  Follow cbc.        Relevant Medications   ferrous fumarate (HEMOCYTE - 106 MG FE) 325 (106 FE) MG TABS tablet   Essential hypertension - Primary    Blood pressure on her checks have been doing well.  Blood pressure on recheck here 140/82.  Same medication regimen.  Follow pressures.  Follow metabolic panel.        GERD (gastroesophageal reflux disease)    Acid reflux symptoms controlled.  Follow.        Health care maintenance    Physical 08/27/13.  Schedule mammogram.        Hemorrhoids    Still intermittent flares.  Plans to f/u with GI 06/13/14.        Hypercholesterolemia    Low cholesterol diet and exercise.  Follow lipid panel        Hyperglycemia    Low carb diet.  Follow.        Nephrolithiasis    Noted on recent ultrasound.  States has been present for years.  Desires no further intervention.  Declines referral to urology.        Obesity (BMI 30-39.9)    Diet and exercise.        Splenomegaly    Size stable per ultrasound 03/13/14.            [name], MD

## 2014-05-28 NOTE — Progress Notes (Signed)
Pre visit review using our clinic review tool, if applicable. No additional management support is needed unless otherwise documented below in the visit note. 

## 2014-05-29 ENCOUNTER — Encounter: Payer: Self-pay | Admitting: Internal Medicine

## 2014-05-29 DIAGNOSIS — Z0000 Encounter for general adult medical examination without abnormal findings: Secondary | ICD-10-CM | POA: Insufficient documentation

## 2014-05-29 NOTE — Assessment & Plan Note (Signed)
Noted on recent ultrasound.  States has been present for years.  Desires no further intervention.  Declines referral to urology.

## 2014-05-29 NOTE — Assessment & Plan Note (Signed)
Physical 08/27/13.  Schedule mammogram.

## 2014-05-29 NOTE — Assessment & Plan Note (Signed)
Has had extensive GI w/up.  Also worked up by Dr Ma Hillock.  Has been released.  Follow cbc.

## 2014-05-29 NOTE — Assessment & Plan Note (Signed)
Acid reflux symptoms controlled.  Follow.

## 2014-05-29 NOTE — Assessment & Plan Note (Signed)
Low carb diet.  Follow.      

## 2014-05-29 NOTE — Assessment & Plan Note (Signed)
Blood pressure on her checks have been doing well.  Blood pressure on recheck here 140/82.  Same medication regimen.  Follow pressures.  Follow metabolic panel.

## 2014-05-29 NOTE — Assessment & Plan Note (Signed)
Still intermittent flares.  Plans to f/u with GI 06/13/14.

## 2014-05-29 NOTE — Assessment & Plan Note (Signed)
Low cholesterol diet and exercise.  Follow lipid panel.   

## 2014-05-29 NOTE — Assessment & Plan Note (Signed)
Size stable per ultrasound 03/13/14.

## 2014-05-29 NOTE — Assessment & Plan Note (Signed)
Diet and exercise.   

## 2014-06-11 ENCOUNTER — Ambulatory Visit: Payer: Self-pay | Admitting: Internal Medicine

## 2014-06-11 DIAGNOSIS — Z1231 Encounter for screening mammogram for malignant neoplasm of breast: Secondary | ICD-10-CM | POA: Diagnosis not present

## 2014-06-11 LAB — HM MAMMOGRAPHY: HM MAMMO: NEGATIVE

## 2014-06-18 DIAGNOSIS — E6609 Other obesity due to excess calories: Secondary | ICD-10-CM | POA: Diagnosis not present

## 2014-06-18 DIAGNOSIS — J449 Chronic obstructive pulmonary disease, unspecified: Secondary | ICD-10-CM | POA: Diagnosis not present

## 2014-06-18 DIAGNOSIS — J310 Chronic rhinitis: Secondary | ICD-10-CM | POA: Diagnosis not present

## 2014-06-20 DIAGNOSIS — K644 Residual hemorrhoidal skin tags: Secondary | ICD-10-CM | POA: Diagnosis not present

## 2014-08-01 DIAGNOSIS — K648 Other hemorrhoids: Secondary | ICD-10-CM | POA: Diagnosis not present

## 2014-08-12 LAB — SURGICAL PATHOLOGY

## 2014-08-28 DIAGNOSIS — K648 Other hemorrhoids: Secondary | ICD-10-CM | POA: Diagnosis not present

## 2014-08-29 ENCOUNTER — Encounter: Payer: Self-pay | Admitting: Internal Medicine

## 2014-08-29 ENCOUNTER — Other Ambulatory Visit (INDEPENDENT_AMBULATORY_CARE_PROVIDER_SITE_OTHER): Payer: Medicare Other

## 2014-08-29 DIAGNOSIS — E7800 Pure hypercholesterolemia, unspecified: Secondary | ICD-10-CM

## 2014-08-29 DIAGNOSIS — I10 Essential (primary) hypertension: Secondary | ICD-10-CM | POA: Diagnosis not present

## 2014-08-29 DIAGNOSIS — R739 Hyperglycemia, unspecified: Secondary | ICD-10-CM

## 2014-08-29 LAB — BASIC METABOLIC PANEL
BUN: 9 mg/dL (ref 6–23)
CALCIUM: 9.8 mg/dL (ref 8.4–10.5)
CO2: 34 mEq/L — ABNORMAL HIGH (ref 19–32)
Chloride: 105 mEq/L (ref 96–112)
Creatinine, Ser: 1.22 mg/dL — ABNORMAL HIGH (ref 0.40–1.20)
GFR: 45.04 mL/min — ABNORMAL LOW (ref >60.00)
GLUCOSE: 95 mg/dL (ref 70–99)
Potassium: 4.1 mEq/L (ref 3.5–5.1)
SODIUM: 142 meq/L (ref 135–145)

## 2014-08-29 LAB — LIPID PANEL
CHOL/HDL RATIO: 4
Cholesterol: 206 mg/dL — ABNORMAL HIGH (ref 0–200)
HDL: 56.9 mg/dL (ref >39.00)
LDL Cholesterol: 118 mg/dL — ABNORMAL HIGH (ref 0–99)
NonHDL: 149.1
TRIGLYCERIDES: 157 mg/dL — AB (ref 0.0–149.0)
VLDL: 31.4 mg/dL (ref 0.0–40.0)

## 2014-08-29 LAB — HEPATIC FUNCTION PANEL
ALBUMIN: 3.7 g/dL (ref 3.5–5.2)
ALK PHOS: 61 U/L (ref 39–117)
ALT: 13 U/L (ref 0–35)
AST: 18 U/L (ref 0–37)
Bilirubin, Direct: 0 mg/dL (ref 0.0–0.3)
Total Bilirubin: 0.4 mg/dL (ref 0.2–1.2)
Total Protein: 6.2 g/dL (ref 6.0–8.3)

## 2014-08-29 LAB — HEMOGLOBIN A1C: Hgb A1c MFr Bld: 5.9 % (ref 4.6–6.5)

## 2014-09-02 ENCOUNTER — Ambulatory Visit (INDEPENDENT_AMBULATORY_CARE_PROVIDER_SITE_OTHER): Payer: Medicare Other | Admitting: Internal Medicine

## 2014-09-02 ENCOUNTER — Encounter: Payer: Self-pay | Admitting: *Deleted

## 2014-09-02 ENCOUNTER — Encounter: Payer: Self-pay | Admitting: Internal Medicine

## 2014-09-02 VITALS — BP 120/80 | HR 113 | Temp 98.0°F | Ht 63.5 in | Wt 169.0 lb

## 2014-09-02 DIAGNOSIS — I10 Essential (primary) hypertension: Secondary | ICD-10-CM

## 2014-09-02 DIAGNOSIS — J4520 Mild intermittent asthma, uncomplicated: Secondary | ICD-10-CM

## 2014-09-02 DIAGNOSIS — R739 Hyperglycemia, unspecified: Secondary | ICD-10-CM

## 2014-09-02 DIAGNOSIS — K2100 Gastro-esophageal reflux disease with esophagitis, without bleeding: Secondary | ICD-10-CM

## 2014-09-02 DIAGNOSIS — D649 Anemia, unspecified: Secondary | ICD-10-CM | POA: Diagnosis not present

## 2014-09-02 DIAGNOSIS — Z23 Encounter for immunization: Secondary | ICD-10-CM | POA: Diagnosis not present

## 2014-09-02 DIAGNOSIS — E669 Obesity, unspecified: Secondary | ICD-10-CM

## 2014-09-02 DIAGNOSIS — R161 Splenomegaly, not elsewhere classified: Secondary | ICD-10-CM

## 2014-09-02 DIAGNOSIS — E7800 Pure hypercholesterolemia, unspecified: Secondary | ICD-10-CM

## 2014-09-02 DIAGNOSIS — Z0000 Encounter for general adult medical examination without abnormal findings: Secondary | ICD-10-CM

## 2014-09-02 DIAGNOSIS — N289 Disorder of kidney and ureter, unspecified: Secondary | ICD-10-CM

## 2014-09-02 DIAGNOSIS — Z1239 Encounter for other screening for malignant neoplasm of breast: Secondary | ICD-10-CM

## 2014-09-02 DIAGNOSIS — K2270 Barrett's esophagus without dysplasia: Secondary | ICD-10-CM

## 2014-09-02 DIAGNOSIS — K649 Unspecified hemorrhoids: Secondary | ICD-10-CM

## 2014-09-02 LAB — CBC WITH DIFFERENTIAL/PLATELET
Basophils Absolute: 0 10*3/uL (ref 0.0–0.1)
Basophils Relative: 0.4 % (ref 0.0–3.0)
Eosinophils Absolute: 0.2 10*3/uL (ref 0.0–0.7)
Eosinophils Relative: 2.8 % (ref 0.0–5.0)
HEMATOCRIT: 38.9 % (ref 36.0–46.0)
HEMOGLOBIN: 12.7 g/dL (ref 12.0–15.0)
Lymphocytes Relative: 16.2 % (ref 12.0–46.0)
Lymphs Abs: 0.9 10*3/uL (ref 0.7–4.0)
MCHC: 32.5 g/dL (ref 30.0–36.0)
MCV: 83.8 fl (ref 78.0–100.0)
MONO ABS: 0.3 10*3/uL (ref 0.1–1.0)
Monocytes Relative: 5.4 % (ref 3.0–12.0)
Neutro Abs: 4.1 10*3/uL (ref 1.4–7.7)
Neutrophils Relative %: 75.2 % (ref 43.0–77.0)
Platelets: 212 10*3/uL (ref 150.0–400.0)
RBC: 4.65 Mil/uL (ref 3.87–5.11)
RDW: 15.1 % (ref 11.5–15.5)
WBC: 5.5 10*3/uL (ref 4.0–10.5)

## 2014-09-02 LAB — BASIC METABOLIC PANEL
BUN: 13 mg/dL (ref 6–23)
CALCIUM: 9.9 mg/dL (ref 8.4–10.5)
CO2: 28 mEq/L (ref 19–32)
Chloride: 103 mEq/L (ref 96–112)
Creatinine, Ser: 1.18 mg/dL (ref 0.40–1.20)
GFR: 46.8 mL/min — ABNORMAL LOW (ref >60.00)
GLUCOSE: 105 mg/dL — AB (ref 70–99)
POTASSIUM: 3.7 meq/L (ref 3.5–5.1)
Sodium: 140 mEq/L (ref 135–145)

## 2014-09-02 LAB — FERRITIN: Ferritin: 36.7 ng/mL (ref 10.0–291.0)

## 2014-09-02 NOTE — Progress Notes (Signed)
Patient ID: Anita Ferrell, female   DOB: 06/29/1932, 82 y.o.   MRN: [id number]   Subjective:    Patient ID: Anita Ferrell, female    DOB: 01/29/1933, 82 y.o.   MRN: [id number]  HPI  Patient here to follow up on her current medical issues as well as for her physical exam.  Increased stress.  Her son recently passed away.  She lives with her other son.  Has good support.  Does not feels she needs any further intervention at this time.  Breathing has been doing better.  No cardiac symptoms reported.  Bowels stable.  Just saw Dr Smith.  Had hemorrhoid banding.  Doing well with this.  Bowels are moving.     Past Medical History  Diagnosis Date  . Allergy   . COPD (chronic obstructive pulmonary disease)   . Hypercholesteremia   . TB (pulmonary tuberculosis)     treated with INH therapy   . Pulmonary hypertension     Right Ventricular systolic pressure at 60 mm Hg by echo on july of 2011; Right heart cardic catherization revealed pulmonary artery pressusre of 27/10 with a mean of; Ventricular pressure 29/10 with pulmonary capillary wedge at 9  . GERD (gastroesophageal reflux disease)   . Hypertension     Current Outpatient Prescriptions on File Prior to Visit  Medication Sig Dispense Refill  . albuterol (PROVENTIL HFA;VENTOLIN HFA) 108 (90 BASE) MCG/ACT inhaler Inhale 2 puffs into the lungs every 6 (six) hours as needed.     . amLODipine (NORVASC) 5 MG tablet Take 1 tablet (5 mg total) by mouth daily. 30 tablet 5  . aspirin EC 81 MG tablet Take 81 mg by mouth daily.     . azelastine (ASTELIN) 137 MCG/SPRAY nasal spray Place 1 spray into the nose 2 (two) times daily. Use in each nostril as directed 30 mL 5  . Calcium Carbonate-Vitamin D (CALTRATE 600+D PO) Take 600 mg by mouth daily.    . Casanthranol-Docusate Sodium 30-100 MG CAPS Take by mouth. Stool softner    . cholecalciferol (VITAMIN D) 400 UNITS TABS Take 1,000 Units by mouth daily.     . ferrous fumarate (HEMOCYTE - 106 MG FE)  325 (106 FE) MG TABS tablet Take 1 tablet by mouth.    . fexofenadine (ALLEGRA) 180 MG tablet Take 180 mg by mouth daily.     . Fluticasone-Salmeterol (ADVAIR DISKUS) 500-50 MCG/DOSE AEPB Inhale 1 puff into the lungs 2 (two) times daily. 60 each 0  . guaiFENesin (MUCINEX) 600 MG 12 hr tablet Take 600 mg by mouth 2 (two) times daily.     . lansoprazole (PREVACID) 30 MG capsule Take 30 mg by mouth daily.     . Multiple Vitamins-Iron (MULTIVITAMINS WITH IRON) TABS Take 1 tablet by mouth daily.    . predniSONE (DELTASONE) 5 MG tablet Take 5 mg by mouth daily.     . SPIRIVA HANDIHALER 18 MCG inhalation capsule INHALE CONTENTS OF ONE CAPSULE ONCE A DAY AS DIRECTED 30 capsule 5  . theophylline (THEODUR) 200 MG 12 hr tablet Take 200 mg by mouth 2 (two) times daily.      No current facility-administered medications on file prior to visit.    Review of Systems  Constitutional: Negative for appetite change and unexpected weight change.  HENT: Negative for congestion and sinus pressure.   Eyes: Negative for pain and visual disturbance.  Respiratory: Negative for cough, chest tightness and shortness of breath.   Cardiovascular: Negative   for chest pain, palpitations and leg swelling.  Gastrointestinal: Negative for nausea, vomiting, abdominal pain and diarrhea.  Genitourinary: Negative for dysuria and difficulty urinating.  Musculoskeletal: Negative for back pain and joint swelling.  Skin: Negative for color change and rash.  Neurological: Negative for dizziness, light-headedness and headaches.  Hematological: Negative for adenopathy. Does not bruise/bleed easily.  Psychiatric/Behavioral: Negative for dysphoric mood and agitation.       Increased stress with the recent death of her son.        Objective:     Pulse recheck 104, blood pressure recheck:  134/72  Physical Exam  Constitutional: She is oriented to person, place, and time. She appears well-developed and well-nourished.  HENT:  Nose:  Nose normal.  Mouth/Throat: Oropharynx is clear and moist.  Eyes: Right eye exhibits no discharge. Left eye exhibits no discharge. No scleral icterus.  Neck: Neck supple. No thyromegaly present.  Cardiovascular: Normal rate and regular rhythm.   Pulmonary/Chest: Breath sounds normal. No accessory muscle usage. No tachypnea. No respiratory distress. She has no decreased breath sounds. She has no wheezes. She has no rhonchi. Right breast exhibits no inverted nipple, no mass, no nipple discharge and no tenderness (no axillary adenopathy). Left breast exhibits no inverted nipple, no mass, no nipple discharge and no tenderness (no axilarry adenopathy).  Abdominal: Soft. Bowel sounds are normal. There is no tenderness.  Musculoskeletal: She exhibits no edema or tenderness.  Lymphadenopathy:    She has no cervical adenopathy.  Neurological: She is alert and oriented to person, place, and time.  Skin: Skin is warm. No rash noted.  Psychiatric: She has a normal mood and affect. Her behavior is normal.    BP 120/80 mmHg  Pulse 113  Temp(Src) 98 F (36.7 C) (Oral)  Ht 5' 3.5" (1.613 m)  Wt 169 lb (76.658 kg)  BMI 29.46 kg/m2  SpO2 95% Wt Readings from Last 3 Encounters:  09/02/14 169 lb (76.658 kg)  05/28/14 171 lb 12 oz (77.905 kg)  03/05/14 175 lb 8 oz (79.606 kg)     Lab Results  Component Value Date   WBC 5.5 09/02/2014   HGB 12.7 09/02/2014   HCT 38.9 09/02/2014   PLT 212.0 09/02/2014   GLUCOSE 105* 09/02/2014   CHOL 206* 08/29/2014   TRIG 157.0* 08/29/2014   HDL 56.90 08/29/2014   LDLDIRECT 127.1 08/25/2012   LDLCALC 118* 08/29/2014   ALT 13 08/29/2014   AST 18 08/29/2014   NA 140 09/02/2014   K 3.7 09/02/2014   CL 103 09/02/2014   CREATININE 1.18 09/02/2014   BUN 13 09/02/2014   CO2 28 09/02/2014   TSH 0.75 03/05/2014   HGBA1C 5.9 08/29/2014       Assessment & Plan:   Problem List Items Addressed This Visit    Anemia    Has had extensive GI w/up.  Evaluated by  Dr Ma Hillock.  Follow cbc.  hgb has been stable.       Relevant Orders   CBC with Differential/Platelet (Completed)   Ferritin (Completed)   Asthma, chronic    Breathing stable.  Continues to f/u with Dr [name].       Barrett's esophagus    Symptoms controlled.  Continue to f/u with Dr Gustavo Lah.  Last EGD 05/07/14.        Essential hypertension - Primary    Blood pressure doing well.  Same medication regimen. Follow pressures.  Follow metabolic panel.       GERD (gastroesophageal reflux disease)  Acid reflux controlled.  Follow.       Health care maintenance    Physical 09/02/14.  Colonoscopy 05/07/14.  Schedule mammogram.  Overdue.        Hemorrhoids    S/p hemorrhoid banding - Dr Smith.  Doing well.       Hypercholesterolemia    Low cholesterol diet and exercise.  Follow lipid panel.  LDL just checked 118.        Hyperglycemia    Low carb diet and exercise.  Follow met b and a1c.        Obesity (BMI 30-39.9)    Diet and exercise.        Splenomegaly    Stable per ultrasound 03/13/14.         Other Visit Diagnoses    Renal insufficiency        Relevant Orders    Basic metabolic panel (Completed)    Need for prophylactic vaccination against Streptococcus pneumoniae (pneumococcus)        Relevant Orders    Pneumococcal conjugate vaccine 13-valent (Completed)    Breast cancer screening        Relevant Orders    MM DIGITAL SCREENING BILATERAL      I spent 25 minutes with the patient and more than 50% of the time was spent in consultation regarding the above.     SCOTT, CHARLENE, MD   

## 2014-09-02 NOTE — Progress Notes (Signed)
Pre visit review using our clinic review tool, if applicable. No additional management support is needed unless otherwise documented below in the visit note. 

## 2014-09-09 ENCOUNTER — Encounter: Payer: Self-pay | Admitting: Internal Medicine

## 2014-09-09 NOTE — Assessment & Plan Note (Signed)
Low cholesterol diet and exercise.  Follow lipid panel.  LDL just checked 118.

## 2014-09-09 NOTE — Assessment & Plan Note (Signed)
Acid reflux controlled.  Follow.

## 2014-09-09 NOTE — Assessment & Plan Note (Signed)
S/p hemorrhoid banding - Dr Tamala Julian.  Doing well.

## 2014-09-09 NOTE — Assessment & Plan Note (Signed)
Stable per ultrasound 03/13/14.

## 2014-09-09 NOTE — Assessment & Plan Note (Signed)
Diet and exercise.   

## 2014-09-09 NOTE — Assessment & Plan Note (Signed)
Physical 09/02/14.  Colonoscopy 05/07/14.  Schedule mammogram.  Overdue.

## 2014-09-09 NOTE — Assessment & Plan Note (Signed)
Symptoms controlled.  Continue to f/u with Dr Gustavo Lah.  Last EGD 05/07/14.

## 2014-09-09 NOTE — Assessment & Plan Note (Signed)
Has had extensive GI w/up.  Evaluated by Dr Ma Hillock.  Follow cbc.  hgb has been stable.

## 2014-09-09 NOTE — Assessment & Plan Note (Signed)
Breathing stable.  Continues to f/u with Dr [name].

## 2014-09-09 NOTE — Assessment & Plan Note (Signed)
Blood pressure doing well.  Same medication regimen.  Follow pressures.  Follow metabolic panel.   

## 2014-09-09 NOTE — Assessment & Plan Note (Signed)
Low carb diet and exercise.  Follow met b and a1c.   

## 2014-10-02 DIAGNOSIS — K648 Other hemorrhoids: Secondary | ICD-10-CM | POA: Diagnosis not present

## 2014-10-31 DIAGNOSIS — K648 Other hemorrhoids: Secondary | ICD-10-CM | POA: Diagnosis not present

## 2014-11-19 DIAGNOSIS — J449 Chronic obstructive pulmonary disease, unspecified: Secondary | ICD-10-CM | POA: Diagnosis not present

## 2014-11-19 DIAGNOSIS — J310 Chronic rhinitis: Secondary | ICD-10-CM | POA: Diagnosis not present

## 2014-12-02 DIAGNOSIS — K648 Other hemorrhoids: Secondary | ICD-10-CM | POA: Diagnosis not present

## 2015-01-07 ENCOUNTER — Ambulatory Visit (INDEPENDENT_AMBULATORY_CARE_PROVIDER_SITE_OTHER): Payer: Medicare Other | Admitting: Internal Medicine

## 2015-01-07 ENCOUNTER — Encounter: Payer: Self-pay | Admitting: Internal Medicine

## 2015-01-07 VITALS — BP 122/80 | HR 114 | Temp 98.4°F | Resp 16 | Ht 63.5 in | Wt 163.8 lb

## 2015-01-07 DIAGNOSIS — I10 Essential (primary) hypertension: Secondary | ICD-10-CM | POA: Diagnosis not present

## 2015-01-07 DIAGNOSIS — J4520 Mild intermittent asthma, uncomplicated: Secondary | ICD-10-CM

## 2015-01-07 DIAGNOSIS — E7800 Pure hypercholesterolemia, unspecified: Secondary | ICD-10-CM

## 2015-01-07 DIAGNOSIS — E669 Obesity, unspecified: Secondary | ICD-10-CM

## 2015-01-07 DIAGNOSIS — K2270 Barrett's esophagus without dysplasia: Secondary | ICD-10-CM

## 2015-01-07 DIAGNOSIS — D649 Anemia, unspecified: Secondary | ICD-10-CM

## 2015-01-07 DIAGNOSIS — R739 Hyperglycemia, unspecified: Secondary | ICD-10-CM

## 2015-01-07 DIAGNOSIS — Z9109 Other allergy status, other than to drugs and biological substances: Secondary | ICD-10-CM | POA: Insufficient documentation

## 2015-01-07 DIAGNOSIS — K649 Unspecified hemorrhoids: Secondary | ICD-10-CM | POA: Diagnosis not present

## 2015-01-07 DIAGNOSIS — Z91048 Other nonmedicinal substance allergy status: Secondary | ICD-10-CM

## 2015-01-07 DIAGNOSIS — R161 Splenomegaly, not elsewhere classified: Secondary | ICD-10-CM

## 2015-01-07 LAB — LIPID PANEL
CHOLESTEROL: 205 mg/dL — AB (ref 0–200)
HDL: 56.5 mg/dL (ref >39.00)
LDL Cholesterol: 121 mg/dL — ABNORMAL HIGH (ref 0–99)
NonHDL: 148.32
TRIGLYCERIDES: 139 mg/dL (ref 0.0–149.0)
Total CHOL/HDL Ratio: 4
VLDL: 27.8 mg/dL (ref 0.0–40.0)

## 2015-01-07 LAB — HEPATIC FUNCTION PANEL
ALK PHOS: 63 U/L (ref 39–117)
ALT: 15 U/L (ref 0–35)
AST: 20 U/L (ref 0–37)
Albumin: 4 g/dL (ref 3.5–5.2)
BILIRUBIN DIRECT: 0.1 mg/dL (ref 0.0–0.3)
TOTAL PROTEIN: 6.7 g/dL (ref 6.0–8.3)
Total Bilirubin: 0.6 mg/dL (ref 0.2–1.2)

## 2015-01-07 LAB — BASIC METABOLIC PANEL
BUN: 13 mg/dL (ref 6–23)
CALCIUM: 10 mg/dL (ref 8.4–10.5)
CO2: 31 mEq/L (ref 19–32)
CREATININE: 1.19 mg/dL (ref 0.40–1.20)
Chloride: 104 mEq/L (ref 96–112)
GFR: 46.31 mL/min — AB (ref >60.00)
Glucose, Bld: 106 mg/dL — ABNORMAL HIGH (ref 70–99)
Potassium: 4 mEq/L (ref 3.5–5.1)
SODIUM: 143 meq/L (ref 135–145)

## 2015-01-07 LAB — HEMOGLOBIN A1C: Hgb A1c MFr Bld: 5.8 % (ref 4.6–6.5)

## 2015-01-07 NOTE — Assessment & Plan Note (Signed)
Blood pressure under good control.  Continue same medication regimen.  Follow pressures.  Follow metabolic panel.   

## 2015-01-07 NOTE — Assessment & Plan Note (Signed)
With some increased congestion.  Feel more related to allergy.  Do not feel abx warranted.  Continue saline nasal spray and astelin as directed.  mucinex as directed.  Follow.  Once clears, give flu shot.

## 2015-01-07 NOTE — Progress Notes (Signed)
Patient ID: Anita Ferrell, female   DOB: [birth year], 81 y.o.   MRN: [id number]   Subjective:    Patient ID: Anita Ferrell, female    DOB: 1932/05/11, 81 y.o.   MRN: [id number]  HPI  Patient with a past history of hypertension, asthma, reflux disease and hypercholesterolemia.  Comes in today to follow up on these issues.  She stays active.  Doing ok from stress.  Son recently passed.  See last note.  Has good support.  Does report some increased nasal congestion and drainage.  Worsened in the last 24-48 hours.  No chest congestion.  No chest tightness.  No increased sob.  Overall feels her breathing is stable.  Sees Dr [name].  No acid reflux.  No abdominal pain or cramping.  Had rubber band ligation recently - Dr Tamala Julian.  Did well for a while.  Minimal streaks of blood on tissue now.  Plans to f/u with Dr Tamala Julian.  Bowels doing better.  Will hold on flu shot today given congestion.     Past Medical History  Diagnosis Date  . Allergy   . COPD (chronic obstructive pulmonary disease)   . Hypercholesteremia   . TB (pulmonary tuberculosis)     treated with INH therapy   . Pulmonary hypertension     Right Ventricular systolic pressure at 60 mm Hg by echo on july of 2011; Right heart cardic catherization revealed pulmonary artery pressusre of 27/10 with a mean of; Ventricular pressure 29/10 with pulmonary capillary wedge at 9  . GERD (gastroesophageal reflux disease)   . Hypertension    Past Surgical History  Procedure Laterality Date  . Tonsillectomy  1964  . Cholecystectomy  2010  . Cataract extraction Bilateral 2008   Family History  Problem Relation Age of Onset  . Heart disease Mother   . Heart disease Father   . Hypertension Sister   . Heart disease Brother    Social History   Social History  . Marital Status: Married    Spouse Name: N/A  . Number of Children: 3  . Years of Education: N/A   Social History Main Topics  . Smoking status: Former Research scientist (life sciences)  . Smokeless  tobacco: Never Used     Comment: Quit in 1992  . Alcohol Use: No  . Drug Use: No  . Sexual Activity: No   Other Topics Concern  . None   Social History Narrative    Outpatient Encounter Prescriptions as of 01/07/2015  Medication Sig  . albuterol (PROVENTIL HFA;VENTOLIN HFA) 108 (90 BASE) MCG/ACT inhaler Inhale 2 puffs into the lungs every 6 (six) hours as needed.   Marland Kitchen amLODipine (NORVASC) 5 MG tablet Take 1 tablet (5 mg total) by mouth daily.  Marland Kitchen aspirin EC 81 MG tablet Take 81 mg by mouth daily.   Marland Kitchen azelastine (ASTELIN) 137 MCG/SPRAY nasal spray Place 1 spray into the nose 2 (two) times daily. Use in each nostril as directed  . Calcium Carbonate-Vitamin D (CALTRATE 600+D PO) Take 600 mg by mouth daily.  [name] Sodium 30-100 MG CAPS Take by mouth. Stool softner  . cholecalciferol (VITAMIN D) 400 UNITS TABS Take 1,000 Units by mouth daily.   . ferrous fumarate (HEMOCYTE - 106 MG FE) 325 (106 FE) MG TABS tablet Take 1 tablet by mouth.  . fexofenadine (ALLEGRA) 180 MG tablet Take 180 mg by mouth daily.   . Fluticasone-Salmeterol (ADVAIR DISKUS) 500-50 MCG/DOSE AEPB Inhale 1 puff into the lungs 2 (two) times daily.  Marland Kitchen  guaiFENesin (MUCINEX) 600 MG 12 hr tablet Take 600 mg by mouth 2 (two) times daily.   . lansoprazole (PREVACID) 30 MG capsule Take 30 mg by mouth daily.   . Multiple Vitamins-Iron (MULTIVITAMINS WITH IRON) TABS Take 1 tablet by mouth daily.  . predniSONE (DELTASONE) 5 MG tablet Take 5 mg by mouth daily.   Marland Kitchen SPIRIVA HANDIHALER 18 MCG inhalation capsule INHALE CONTENTS OF ONE CAPSULE ONCE A DAY AS DIRECTED  . theophylline (UNIPHYL) 400 MG 24 hr tablet Take 400 mg by mouth daily.  . [DISCONTINUED] theophylline (THEODUR) 200 MG 12 hr tablet Take 200 mg by mouth 2 (two) times daily.    No facility-administered encounter medications on file as of 01/07/2015.    Review of Systems  Constitutional: Negative for appetite change and unexpected weight change.  HENT:  Positive for congestion and postnasal drip. Negative for sinus pressure.   Eyes: Negative for pain and visual disturbance.  Respiratory: Positive for cough (related to drainage.  no chest tightness.  ). Negative for chest tightness and shortness of breath.   Cardiovascular: Negative for chest pain, palpitations and leg swelling.  Gastrointestinal: Negative for nausea, vomiting, abdominal pain and diarrhea.       Bowels doing better.  Some spotting from hemorrhoids.    Musculoskeletal: Negative for back pain and joint swelling.  Skin: Negative for color change and rash.  Neurological: Negative for dizziness, light-headedness and headaches.  Psychiatric/Behavioral: Negative for dysphoric mood and agitation.       Handling stress relatively well.        Objective:     Blood pressure rechecked by me:  128/78  Physical Exam  Constitutional: She appears well-developed and well-nourished. No distress.  HENT:  Nose: Nose normal.  Mouth/Throat: Oropharynx is clear and moist.  No tenderness to palpation over the sinuses.   Neck: Neck supple. No thyromegaly present.  Cardiovascular: Normal rate and regular rhythm.   Pulmonary/Chest: Breath sounds normal. No respiratory distress. She has no wheezes.  Good air movement.  No wheezing.   Abdominal: Soft. Bowel sounds are normal. There is no tenderness.  Musculoskeletal: She exhibits no edema or tenderness.  Lymphadenopathy:    She has no cervical adenopathy.  Skin: No rash noted. No erythema.  Psychiatric: She has a normal mood and affect. Her behavior is normal.    BP 122/80 mmHg  Pulse 114  Temp(Src) 98.4 F (36.9 C) (Oral)  Resp 16  Ht 5' 3.5" (1.613 m)  Wt 163 lb 12 oz (74.277 kg)  BMI 28.55 kg/m2  SpO2 95% Wt Readings from Last 3 Encounters:  01/07/15 163 lb 12 oz (74.277 kg)  09/02/14 169 lb (76.658 kg)  05/28/14 171 lb 12 oz (77.905 kg)     Lab Results  Component Value Date   WBC 5.5 09/02/2014   HGB 12.7 09/02/2014    HCT 38.9 09/02/2014   PLT 212.0 09/02/2014   GLUCOSE 106* 01/07/2015   CHOL 205* 01/07/2015   TRIG 139.0 01/07/2015   HDL 56.50 01/07/2015   LDLDIRECT 127.1 08/25/2012   LDLCALC 121* 01/07/2015   ALT 15 01/07/2015   AST 20 01/07/2015   NA 143 01/07/2015   K 4.0 01/07/2015   CL 104 01/07/2015   CREATININE 1.19 01/07/2015   BUN 13 01/07/2015   CO2 31 01/07/2015   TSH 0.75 03/05/2014   HGBA1C 5.8 01/07/2015       Assessment & Plan:   Problem List Items Addressed This Visit    Anemia  Has had extensive GI w/up.  Evaluated by Dr Ma Hillock.  Follow cbc.        Asthma, chronic    Continue f/u with Dr [name].  Continue current regimen.  mucinex as directed.  Follow.        Relevant Medications   theophylline (UNIPHYL) 400 MG 24 hr tablet   Barrett's esophagus    Symptoms controlled.  Continue f/u with Dr Gustavo Lah.  Last EGD 05/07/14.        Environmental allergies    With some increased congestion.  Feel more related to allergy.  Do not feel abx warranted.  Continue saline nasal spray and astelin as directed.  mucinex as directed.  Follow.  Once clears, give flu shot.        Essential hypertension - Primary    Blood pressure under good control.  Continue same medication regimen.  Follow pressures.  Follow metabolic panel.        Relevant Orders   Basic metabolic panel (Completed)   Hemorrhoids    S/p rubber band ligation.  Has been doing well.  Some occasional streaking of blood on the tissue when she wipes.  Plans to f/u with Dr Tamala Julian.        Hypercholesterolemia    Low cholesterol diet and exercise.  Follow lipid panel and liver function tests.        Relevant Orders   Lipid panel (Completed)   Hepatic function panel (Completed)   Hyperglycemia    Low carb diet and exercise.  Follow met b and a1c.        Relevant Orders   Hemoglobin A1c (Completed)   Obesity (BMI 30-39.9)    Diet and exercise.  Follow.        Splenomegaly    Stable per ultrasound  03/13/14.           [name], MD

## 2015-01-07 NOTE — Progress Notes (Signed)
Pre-visit discussion using our clinic review tool. No additional management support is needed unless otherwise documented below in the visit note.  

## 2015-01-08 ENCOUNTER — Encounter: Payer: Self-pay | Admitting: Internal Medicine

## 2015-01-12 ENCOUNTER — Encounter: Payer: Self-pay | Admitting: Internal Medicine

## 2015-01-12 NOTE — Assessment & Plan Note (Signed)
Symptoms controlled.  Continue f/u with Dr Gustavo Lah.  Last EGD 05/07/14.

## 2015-01-12 NOTE — Assessment & Plan Note (Signed)
Continue f/u with Dr [name].  Continue current regimen.  mucinex as directed.  Follow.

## 2015-01-12 NOTE — Assessment & Plan Note (Signed)
Diet and exercise.  Follow.  

## 2015-01-12 NOTE — Assessment & Plan Note (Signed)
Has had extensive GI w/up.  Evaluated by Dr Ma Hillock.  Follow cbc.

## 2015-01-12 NOTE — Assessment & Plan Note (Signed)
S/p rubber band ligation.  Has been doing well.  Some occasional streaking of blood on the tissue when she wipes.  Plans to f/u with Dr Tamala Julian.

## 2015-01-12 NOTE — Assessment & Plan Note (Signed)
Low carb diet and exercise.  Follow met b and a1c.   

## 2015-01-12 NOTE — Assessment & Plan Note (Signed)
Low cholesterol diet and exercise.  Follow lipid panel and liver function tests.  

## 2015-01-12 NOTE — Assessment & Plan Note (Signed)
Stable per ultrasound 03/13/14.

## 2015-01-14 ENCOUNTER — Ambulatory Visit (INDEPENDENT_AMBULATORY_CARE_PROVIDER_SITE_OTHER): Payer: Medicare Other

## 2015-01-14 DIAGNOSIS — Z23 Encounter for immunization: Secondary | ICD-10-CM | POA: Diagnosis not present

## 2015-03-11 ENCOUNTER — Emergency Department: Payer: Medicare Other

## 2015-03-11 ENCOUNTER — Encounter: Payer: Self-pay | Admitting: *Deleted

## 2015-03-11 ENCOUNTER — Ambulatory Visit (INDEPENDENT_AMBULATORY_CARE_PROVIDER_SITE_OTHER): Payer: Medicare Other | Admitting: Family Medicine

## 2015-03-11 ENCOUNTER — Inpatient Hospital Stay
Admission: EM | Admit: 2015-03-11 | Discharge: 2015-03-14 | DRG: 193 | Disposition: A | Discharge status: Home / Self Care | Payer: Medicare Other | Attending: Internal Medicine | Admitting: Internal Medicine

## 2015-03-11 ENCOUNTER — Encounter: Payer: Self-pay | Admitting: Family Medicine

## 2015-03-11 VITALS — BP 138/82 | HR 75 | Temp 98.2°F | Ht 63.5 in | Wt 160.0 lb

## 2015-03-11 DIAGNOSIS — J679 Hypersensitivity pneumonitis due to unspecified organic dust: Secondary | ICD-10-CM | POA: Diagnosis present

## 2015-03-11 DIAGNOSIS — R0602 Shortness of breath: Secondary | ICD-10-CM | POA: Diagnosis not present

## 2015-03-11 DIAGNOSIS — I272 Other secondary pulmonary hypertension: Secondary | ICD-10-CM | POA: Diagnosis present

## 2015-03-11 DIAGNOSIS — R05 Cough: Secondary | ICD-10-CM | POA: Diagnosis not present

## 2015-03-11 DIAGNOSIS — R0902 Hypoxemia: Secondary | ICD-10-CM

## 2015-03-11 DIAGNOSIS — E669 Obesity, unspecified: Secondary | ICD-10-CM | POA: Diagnosis present

## 2015-03-11 DIAGNOSIS — K219 Gastro-esophageal reflux disease without esophagitis: Secondary | ICD-10-CM | POA: Diagnosis not present

## 2015-03-11 DIAGNOSIS — J45909 Unspecified asthma, uncomplicated: Secondary | ICD-10-CM | POA: Diagnosis present

## 2015-03-11 DIAGNOSIS — J449 Chronic obstructive pulmonary disease, unspecified: Secondary | ICD-10-CM | POA: Diagnosis not present

## 2015-03-11 DIAGNOSIS — J9601 Acute respiratory failure with hypoxia: Secondary | ICD-10-CM | POA: Diagnosis present

## 2015-03-11 DIAGNOSIS — J84112 Idiopathic pulmonary fibrosis: Secondary | ICD-10-CM | POA: Diagnosis not present

## 2015-03-11 DIAGNOSIS — J189 Pneumonia, unspecified organism: Secondary | ICD-10-CM

## 2015-03-11 DIAGNOSIS — K122 Cellulitis and abscess of mouth: Secondary | ICD-10-CM | POA: Diagnosis present

## 2015-03-11 DIAGNOSIS — J441 Chronic obstructive pulmonary disease with (acute) exacerbation: Secondary | ICD-10-CM | POA: Diagnosis present

## 2015-03-11 DIAGNOSIS — Z8611 Personal history of tuberculosis: Secondary | ICD-10-CM | POA: Diagnosis not present

## 2015-03-11 DIAGNOSIS — Z87891 Personal history of nicotine dependence: Secondary | ICD-10-CM

## 2015-03-11 DIAGNOSIS — E785 Hyperlipidemia, unspecified: Secondary | ICD-10-CM | POA: Diagnosis present

## 2015-03-11 DIAGNOSIS — R911 Solitary pulmonary nodule: Secondary | ICD-10-CM | POA: Diagnosis not present

## 2015-03-11 DIAGNOSIS — Z880 Allergy status to penicillin: Secondary | ICD-10-CM | POA: Diagnosis not present

## 2015-03-11 DIAGNOSIS — R000 Tachycardia, unspecified: Secondary | ICD-10-CM | POA: Diagnosis not present

## 2015-03-11 DIAGNOSIS — I10 Essential (primary) hypertension: Secondary | ICD-10-CM | POA: Diagnosis not present

## 2015-03-11 DIAGNOSIS — E7800 Pure hypercholesterolemia, unspecified: Secondary | ICD-10-CM | POA: Diagnosis present

## 2015-03-11 DIAGNOSIS — R0600 Dyspnea, unspecified: Secondary | ICD-10-CM | POA: Diagnosis not present

## 2015-03-11 DIAGNOSIS — Z6828 Body mass index (BMI) 28.0-28.9, adult: Secondary | ICD-10-CM

## 2015-03-11 DIAGNOSIS — R0682 Tachypnea, not elsewhere classified: Secondary | ICD-10-CM | POA: Diagnosis not present

## 2015-03-11 HISTORY — DX: Unspecified asthma, uncomplicated: J45.909

## 2015-03-11 LAB — TROPONIN I

## 2015-03-11 LAB — CBC
HCT: 38.2 % (ref 35.0–47.0)
HEMOGLOBIN: 12.3 g/dL (ref 12.0–16.0)
MCH: 27.3 pg (ref 26.0–34.0)
MCHC: 32.3 g/dL (ref 32.0–36.0)
MCV: 84.4 fL (ref 80.0–100.0)
Platelets: 201 10*3/uL (ref 150–440)
RBC: 4.53 MIL/uL (ref 3.80–5.20)
RDW: 14.5 % (ref 11.5–14.5)
WBC: 6.5 10*3/uL (ref 3.6–11.0)

## 2015-03-11 LAB — HEPATIC FUNCTION PANEL
ALT: 22 U/L (ref 14–54)
AST: 30 U/L (ref 15–41)
Albumin: 3.8 g/dL (ref 3.5–5.0)
Alkaline Phosphatase: 60 U/L (ref 38–126)
Bilirubin, Direct: <0.1 mg/dL — ABNORMAL LOW (ref 0.1–0.5)
TOTAL PROTEIN: 6.7 g/dL (ref 6.5–8.1)
Total Bilirubin: 0.4 mg/dL (ref 0.3–1.2)

## 2015-03-11 LAB — BASIC METABOLIC PANEL
Anion gap: 9 (ref 5–15)
BUN: 16 mg/dL (ref 6–20)
CO2: 25 mmol/L (ref 22–32)
CREATININE: 1.09 mg/dL — AB (ref 0.44–1.00)
Calcium: 9.3 mg/dL (ref 8.9–10.3)
Chloride: 108 mmol/L (ref 101–111)
GFR calc Af Amer: 54 mL/min — ABNORMAL LOW (ref >60)
GFR, EST NON AFRICAN AMERICAN: 47 mL/min — AB (ref >60)
Glucose, Bld: 127 mg/dL — ABNORMAL HIGH (ref 65–99)
POTASSIUM: 3.7 mmol/L (ref 3.5–5.1)
SODIUM: 142 mmol/L (ref 135–145)

## 2015-03-11 LAB — MAGNESIUM: MAGNESIUM: 1.9 mg/dL (ref 1.7–2.4)

## 2015-03-11 MED ORDER — THEOPHYLLINE ER 200 MG PO TB12
400.0000 mg | ORAL_TABLET | Freq: Every day | ORAL | Status: DC
  Filled 2015-03-11 (×2): qty 2

## 2015-03-11 MED ORDER — ACETAMINOPHEN 325 MG PO TABS: 650.0000 mg | ORAL_TABLET | Freq: Four times a day (QID) | ORAL | Status: DC | PRN

## 2015-03-11 MED ORDER — LORATADINE 10 MG PO TABS
10.0000 mg | ORAL_TABLET | Freq: Every day | ORAL | Status: DC
  Administered 2015-03-12 – 2015-03-14 (×3): 10 mg via ORAL
  Filled 2015-03-11 (×3): qty 1

## 2015-03-11 MED ORDER — METHYLPREDNISOLONE SODIUM SUCC 125 MG IJ SOLR
60.0000 mg | INTRAMUSCULAR | Status: DC
  Administered 2015-03-12 – 2015-03-13 (×2): 60 mg via INTRAVENOUS
  Filled 2015-03-11 (×2): qty 2

## 2015-03-11 MED ORDER — ACETAMINOPHEN 500 MG PO TABS: 1000.0000 mg | ORAL_TABLET | Freq: Four times a day (QID) | ORAL | Status: DC | PRN

## 2015-03-11 MED ORDER — TIOTROPIUM BROMIDE MONOHYDRATE 18 MCG IN CAPS
18.0000 ug | ORAL_CAPSULE | Freq: Every day | RESPIRATORY_TRACT | Status: DC
  Administered 2015-03-12 – 2015-03-14 (×3): 18 ug via RESPIRATORY_TRACT
  Filled 2015-03-11: qty 5

## 2015-03-11 MED ORDER — ENOXAPARIN SODIUM 40 MG/0.4ML ~~LOC~~ SOLN
40.0000 mg | SUBCUTANEOUS | Status: DC
  Administered 2015-03-11 – 2015-03-13 (×3): 40 mg via SUBCUTANEOUS
  Filled 2015-03-11 (×3): qty 0.4

## 2015-03-11 MED ORDER — GUAIFENESIN ER 600 MG PO TB12
600.0000 mg | ORAL_TABLET | Freq: Two times a day (BID) | ORAL | Status: DC
  Administered 2015-03-11 – 2015-03-14 (×6): 600 mg via ORAL
  Filled 2015-03-11 (×6): qty 1

## 2015-03-11 MED ORDER — CALCIUM CARBONATE-VITAMIN D 500-200 MG-UNIT PO TABS
1.0000 | ORAL_TABLET | Freq: Every day | ORAL | Status: DC
  Administered 2015-03-11 – 2015-03-13 (×3): 1 via ORAL
  Filled 2015-03-11 (×3): qty 1

## 2015-03-11 MED ORDER — SODIUM CHLORIDE 0.9 % IV SOLN
Freq: Once | INTRAVENOUS | Status: AC
  Administered 2015-03-11: 18:00:00 via INTRAVENOUS

## 2015-03-11 MED ORDER — ADULT MULTIVITAMIN W/MINERALS CH
1.0000 | ORAL_TABLET | Freq: Every day | ORAL | Status: DC
  Administered 2015-03-12 – 2015-03-14 (×3): 1 via ORAL
  Filled 2015-03-11 (×3): qty 1

## 2015-03-11 MED ORDER — DEXAMETHASONE SODIUM PHOSPHATE 10 MG/ML IJ SOLN
10.0000 mg | Freq: Once | INTRAMUSCULAR | Status: AC
  Administered 2015-03-11: 10 mg via INTRAVENOUS
  Filled 2015-03-11: qty 1

## 2015-03-11 MED ORDER — VITAMIN D 1000 UNITS PO TABS
1000.0000 [IU] | ORAL_TABLET | Freq: Every day | ORAL | Status: DC
  Administered 2015-03-11 – 2015-03-12 (×2): 1000 [IU] via ORAL
  Filled 2015-03-11 (×2): qty 1

## 2015-03-11 MED ORDER — ONDANSETRON HCL 4 MG/2ML IJ SOLN: 4.0000 mg | Freq: Four times a day (QID) | INTRAMUSCULAR | Status: DC | PRN

## 2015-03-11 MED ORDER — AMLODIPINE BESYLATE 5 MG PO TABS
5.0000 mg | ORAL_TABLET | Freq: Every day | ORAL | Status: DC
  Administered 2015-03-12: 5 mg via ORAL
  Filled 2015-03-11: qty 1

## 2015-03-11 MED ORDER — ALBUTEROL SULFATE HFA 108 (90 BASE) MCG/ACT IN AERS: 2.0000 | INHALATION_SPRAY | RESPIRATORY_TRACT | Status: DC | PRN

## 2015-03-11 MED ORDER — SODIUM CHLORIDE 0.9 % IJ SOLN
3.0000 mL | Freq: Two times a day (BID) | INTRAMUSCULAR | Status: DC
  Administered 2015-03-12 – 2015-03-14 (×5): 3 mL via INTRAVENOUS

## 2015-03-11 MED ORDER — ONDANSETRON HCL 4 MG PO TABS: 4.0000 mg | ORAL_TABLET | Freq: Four times a day (QID) | ORAL | Status: DC | PRN

## 2015-03-11 MED ORDER — DOXYCYCLINE HYCLATE 100 MG PO TABS
100.0000 mg | ORAL_TABLET | ORAL | Status: AC
  Administered 2015-03-11: 100 mg via ORAL
  Filled 2015-03-11: qty 1

## 2015-03-11 MED ORDER — ACETAMINOPHEN 650 MG RE SUPP: 650.0000 mg | Freq: Four times a day (QID) | RECTAL | Status: DC | PRN

## 2015-03-11 MED ORDER — AZELASTINE HCL 0.1 % NA SOLN
1.0000 | Freq: Two times a day (BID) | NASAL | Status: DC
  Administered 2015-03-11 – 2015-03-14 (×6): 1 via NASAL
  Filled 2015-03-11: qty 30

## 2015-03-11 MED ORDER — SODIUM CHLORIDE 0.9 % IJ SOLN
3.0000 mL | INTRAMUSCULAR | Status: DC | PRN
  Administered 2015-03-13: 3 mL via INTRAVENOUS
  Filled 2015-03-11: qty 10

## 2015-03-11 MED ORDER — SODIUM CHLORIDE 0.9 % IJ SOLN: 3.0000 mL | INTRAMUSCULAR | Status: DC | PRN

## 2015-03-11 MED ORDER — MOMETASONE FURO-FORMOTEROL FUM 200-5 MCG/ACT IN AERO
2.0000 | INHALATION_SPRAY | Freq: Two times a day (BID) | RESPIRATORY_TRACT | Status: DC
  Administered 2015-03-11 – 2015-03-14 (×6): 2 via RESPIRATORY_TRACT
  Filled 2015-03-11: qty 8.8

## 2015-03-11 MED ORDER — FERROUS SULFATE 325 (65 FE) MG PO TABS
325.0000 mg | ORAL_TABLET | Freq: Every day | ORAL | Status: DC
  Administered 2015-03-11 – 2015-03-13 (×3): 325 mg via ORAL
  Filled 2015-03-11 (×3): qty 1

## 2015-03-11 MED ORDER — IPRATROPIUM-ALBUTEROL 0.5-2.5 (3) MG/3ML IN SOLN
3.0000 mL | Freq: Once | RESPIRATORY_TRACT | Status: AC
  Administered 2015-03-11: 3 mL via RESPIRATORY_TRACT
  Filled 2015-03-11: qty 3

## 2015-03-11 MED ORDER — SODIUM CHLORIDE 0.9 % IJ SOLN
3.0000 mL | Freq: Two times a day (BID) | INTRAMUSCULAR | Status: DC
  Administered 2015-03-12 – 2015-03-14 (×5): 3 mL via INTRAVENOUS

## 2015-03-11 MED ORDER — IOHEXOL 350 MG/ML SOLN
75.0000 mL | Freq: Once | INTRAVENOUS | Status: AC | PRN
  Administered 2015-03-11: 75 mL via INTRAVENOUS

## 2015-03-11 MED ORDER — ASPIRIN EC 81 MG PO TBEC
81.0000 mg | DELAYED_RELEASE_TABLET | Freq: Every day | ORAL | Status: DC
  Administered 2015-03-12 – 2015-03-14 (×3): 81 mg via ORAL
  Filled 2015-03-11 (×3): qty 1

## 2015-03-11 MED ORDER — SODIUM CHLORIDE 0.9 % IV BOLUS (SEPSIS)
500.0000 mL | INTRAVENOUS | Status: AC
  Administered 2015-03-11: 500 mL via INTRAVENOUS

## 2015-03-11 MED ORDER — SODIUM CHLORIDE 0.9 % IV SOLN: 250.0000 mL | INTRAVENOUS | Status: DC | PRN

## 2015-03-11 MED ORDER — DOXYCYCLINE HYCLATE 100 MG PO TABS
100.0000 mg | ORAL_TABLET | Freq: Two times a day (BID) | ORAL | Status: DC
  Administered 2015-03-12 – 2015-03-14 (×5): 100 mg via ORAL
  Filled 2015-03-11 (×6): qty 1

## 2015-03-11 MED ORDER — ALBUTEROL SULFATE (2.5 MG/3ML) 0.083% IN NEBU: 2.5000 mg | INHALATION_SOLUTION | RESPIRATORY_TRACT | Status: DC | PRN

## 2015-03-11 NOTE — Progress Notes (Signed)
Pre visit review using our clinic review tool, if applicable. No additional management support is needed unless otherwise documented below in the visit note. 

## 2015-03-11 NOTE — ED Notes (Signed)
Attempted to call report, bed not ready.

## 2015-03-11 NOTE — Progress Notes (Signed)
Patient ID: Anita Ferrell, female   DOB: 12-01-1932, 81 y.o.   MRN: [id number]  Anita Rumps, MD Phone: [phone]  Anita Ferrell is a 81 y.o. female who presents today for a appointment.  Patient notes starting Thursday she developed cough. This progressively turned into a productive cough with thick green mucus. She has been increasingly short of breath. She notes she can't hardly move without being short of breath. In the office she is only able to speak 1-2 words prior to getting short of breath. She's not had any chest pain. She's not had any fever. She has been sweating at home. She has noted some mild sinus congestion with this. No history of VTE. No recent surgeries. No recent long trips. She's been taking allergy medicine for this. She does have a history of asthma.  PMH: Former smoker.   ROS see history of present illness  Objective  Physical Exam Filed Vitals:   03/11/15 1054  BP: 138/82  Pulse: 75  Temp: 98.2 F (36.8 C)   Ambulatory O2 sat 90%  Physical Exam  Constitutional:  Patient is breathing heavily between words  HENT:  Head: Normocephalic and atraumatic.  Right Ear: External ear normal.  Left Ear: External ear normal.  Mouth/Throat: Oropharynx is clear and moist. No oropharyngeal exudate.  Eyes: Conjunctivae are normal. Pupils are equal, round, and reactive to light.  Neck: Neck supple.  Cardiovascular: Normal rate, regular rhythm and normal heart sounds.  Exam reveals no gallop and no friction rub.   No murmur heard. Pulmonary/Chest:  Increased respiratory effort, mild respiratory distress with no accessory muscle use, no wheezes, there are rales in the left lower lung field  Lymphadenopathy:    She has no cervical adenopathy.  Neurological: She is alert. Gait normal.  Skin: Skin is warm and dry. She is not diaphoretic.     Assessment/Plan: Please see individual problem list.  Shortness of breath Patient with shortness of breath at  rest worsening with activity. She has left lower lung field rales consistent with likely pneumonia, though she has no fevers. Her oxygen did drop to 90% on ambulation. Given her level of shortness of breath at rest and on exertion and her oxygen desaturation she warrants emergent evaluation in the ED. EMS was called to transport the patient to the ED. I called the ED charge nurse to inform them the patient was on her way.    Anita Ferrell

## 2015-03-11 NOTE — ED Notes (Signed)
Pt arrives via EMS from [facility], states SOB and productive cough with brown sputum, pt states she has been coughing since Thursday, pt awake and alert on RA upon arrival, pt hyperventilating, encouraged to take slow deep breathes

## 2015-03-11 NOTE — ED Notes (Signed)
Patient transported to CT 

## 2015-03-11 NOTE — ED Notes (Signed)
Called report to [facility] on 1c

## 2015-03-11 NOTE — Assessment & Plan Note (Addendum)
Patient with shortness of breath at rest worsening with activity. She has left lower lung field rales consistent with likely pneumonia, though she has no fevers. Her oxygen did drop to 90% on ambulation. Given her level of shortness of breath at rest and on exertion and her oxygen desaturation she warrants emergent evaluation in the ED. EMS was called to transport the patient to the ED. I called the ED charge nurse to inform them the patient was on her way.

## 2015-03-11 NOTE — ED Notes (Signed)
Pt given coke to drink, resting in bed quietly, son at bedside

## 2015-03-11 NOTE — ED Provider Notes (Signed)
[facility] Emergency Department Provider Note  ____________________________________________  Time seen: Approximately 12:00 PM  I have reviewed the triage vital signs and the nursing notes.   HISTORY  Chief Complaint Shortness of Breath    HPI [name] is a 81 y.o. female who reports a history of asthma, COPD, and has a documented history of pulmonary tuberculosis status post treatment with INH who presents with worsening shortness of breath over the last 5 days.  She states that it was bad last week and that started to improve and that she had a good night last night, but when she awoke this morning she felt like she could not catch her breath.  It is much worse with exertion.  She describes the symptoms as moderate at baseline and severe with exertion.  Albuterol and her other inhalers do not help.  She also has a cough productive of thick dark colored sputum which is been consistent but worsening over the last several days.  She denies fever/chills, chest pain, abdominal pain, nausea/vomiting, dysuria.She was seen at her primary care doctor earlier today and sent over to the emergency department.   Past Medical History  Diagnosis Date  . Allergy   . COPD (chronic obstructive pulmonary disease) ([place])   . Hypercholesteremia   . TB (pulmonary tuberculosis)     treated with INH therapy   . Pulmonary hypertension (HCC)     Right Ventricular systolic pressure at 60 mm Hg by echo on july of 2011; Right heart cardic catherization revealed pulmonary artery pressusre of 27/10 with a mean of; Ventricular pressure 29/10 with pulmonary capillary wedge at 9  . GERD (gastroesophageal reflux disease)   . Hypertension   . Asthma     Patient Active Problem List   Diagnosis Date Noted  . Shortness of breath 03/11/2015  . Environmental allergies 01/07/2015  . Health care maintenance 05/29/2014  . Nephrolithiasis 03/13/2014  . Obesity (BMI 30-39.9) 03/10/2014   . Splenomegaly 03/05/2014  . Rectal bleeding 03/05/2014  . Hyperglycemia 12/30/2012  . Hemorrhoids 12/30/2012  . Anemia 08/27/2012  . Hypercholesterolemia 08/25/2012  . Essential hypertension 05/28/2012  . Asthma, chronic 05/28/2012  . GERD (gastroesophageal reflux disease) 05/28/2012  . Barrett's esophagus 05/28/2012    Past Surgical History  Procedure Laterality Date  . Tonsillectomy  1964  . Cholecystectomy  2010  . Cataract extraction Bilateral 2008    Current Outpatient Rx  Name  Route  Sig  Dispense  Refill  . acetaminophen (TYLENOL) 500 MG tablet   Oral   Take 1,000 mg by mouth every 6 (six) hours as needed for mild pain.         Marland Kitchen albuterol (PROVENTIL HFA;VENTOLIN HFA) 108 (90 BASE) MCG/ACT inhaler   Inhalation   Inhale 2 puffs into the lungs every 4 (four) hours as needed for wheezing or shortness of breath.          Marland Kitchen amLODipine (NORVASC) 5 MG tablet   Oral   Take 1 tablet (5 mg total) by mouth daily.   30 tablet   5   . aspirin EC 81 MG tablet   Oral   Take 81 mg by mouth daily.          Marland Kitchen azelastine (ASTELIN) 0.1 % nasal spray   Each Nare   Place 1 spray into both nostrils 2 (two) times daily.         . Calcium Carbonate-Vitamin D (CALCIUM 600+D) 600-400 MG-UNIT tablet   Oral  Take 1 tablet by mouth at bedtime.         . cholecalciferol (VITAMIN D) 1000 UNITS tablet   Oral   Take 1,000 Units by mouth at bedtime.         . ferrous sulfate 325 (65 FE) MG tablet   Oral   Take 325 mg by mouth at bedtime.         . fexofenadine (ALLEGRA) 180 MG tablet   Oral   Take 180 mg by mouth daily.          . Fluticasone-Salmeterol (ADVAIR DISKUS) 500-50 MCG/DOSE AEPB   Inhalation   Inhale 1 puff into the lungs 2 (two) times daily.   60 each   0   . guaiFENesin (MUCINEX) 600 MG 12 hr tablet   Oral   Take 600 mg by mouth 2 (two) times daily.          . lansoprazole (PREVACID) 30 MG capsule   Oral   Take 30 mg by mouth daily.           . Multiple Vitamin (MULTIVITAMIN WITH MINERALS) TABS tablet   Oral   Take 1 tablet by mouth daily.         . predniSONE (DELTASONE) 5 MG tablet   Oral   Take 5 mg by mouth daily.          . theophylline (UNIPHYL) 400 MG 24 hr tablet   Oral   Take 400 mg by mouth daily.         Marland Kitchen tiotropium (SPIRIVA) 18 MCG inhalation capsule   Inhalation   Place 18 mcg into inhaler and inhale daily.           Allergies Penicillins  Family History  Problem Relation Age of Onset  . Heart disease Mother   . Heart disease Father   . Hypertension Sister   . Heart disease Brother     Social History Social History  Substance Use Topics  . Smoking status: Former Research scientist (life sciences)  . Smokeless tobacco: Never Used     Comment: Quit in 1992  . Alcohol Use: No    Review of Systems Constitutional: No fever/chills Eyes: No visual changes. ENT: No sore throat. Cardiovascular: Denies chest pain. Respiratory: Severe shortness of breath, worse with exertion Gastrointestinal: No abdominal pain.  No nausea, no vomiting.  No diarrhea.  No constipation. Genitourinary: Negative for dysuria. Musculoskeletal: Negative for back pain. Skin: Negative for rash. Neurological: Negative for headaches, focal weakness or numbness.  10-point ROS otherwise negative.  ____________________________________________   PHYSICAL EXAM:  VITAL SIGNS: ED Triage Vitals  Enc Vitals Group     BP 03/11/15 1153 180/109 mmHg     Pulse Rate 03/11/15 1153 106     Resp 03/11/15 1153 22     Temp 03/11/15 1153 98.3 F (36.8 C)     Temp Source 03/11/15 1153 Oral     SpO2 03/11/15 1151 96 %     Weight 03/11/15 1153 161 lb (73.029 kg)     Height 03/11/15 1153 5\' 3"  (1.6 m)     Head Cir --      Peak Flow --      Pain Score 03/11/15 1154 0     Pain Loc --      Pain Edu? --      Excl. in [place]? --     Constitutional: Alert and oriented.  Well appearing but is anxious, breathing with pursed lips, and in mild  respiratory  distress but speaking in complete sentences Eyes: Conjunctivae are normal. PERRL. EOMI. Head: Atraumatic. Nose: No congestion/rhinnorhea. Mouth/Throat: Mucous membranes are moist.  Oropharynx non-erythematous. Neck: No stridor.   Cardiovascular: Tachypnea in the 110s, regular rhythm. Grossly normal heart sounds.  Good peripheral circulation. Respiratory: Lungs are clear to auscultation bilaterally with no wheezes, rales, or rhonchi.  There is tachypnea and slightly increased work of breathing. Gastrointestinal: Soft and nontender. No distention. No abdominal bruits. No CVA tenderness. Musculoskeletal: No lower extremity tenderness nor edema.  No joint effusions. Neurologic:  Normal speech and language. No gross focal neurologic deficits are appreciated.  Skin:  Skin is warm, dry and intact. No rash noted. Psychiatric: Mood and affect are normal. Speech and behavior are normal.  ____________________________________________   LABS (all labs ordered are listed, but only abnormal results are displayed)  Labs Reviewed  BASIC METABOLIC PANEL - Abnormal; Notable for the following:    Glucose, Bld 127 (*)    Creatinine, Ser 1.09 (*)    GFR calc non Af Amer 47 (*)    GFR calc Af Amer 54 (*)    All other components within normal limits  HEPATIC FUNCTION PANEL - Abnormal; Notable for the following:    Bilirubin, Direct <0.1 (*)    All other components within normal limits  CULTURE, BLOOD (ROUTINE X 2)  CULTURE, BLOOD (ROUTINE X 2)  CBC  TROPONIN I  MAGNESIUM   ____________________________________________  EKG  ED ECG REPORT I, Damiya Sandefur, the attending physician, personally viewed and interpreted this ECG.   Date: 03/11/2015  EKG Time: 11:52  Rate: 107  Rhythm: sinus tachycardia  Axis: Left axis deviation  Intervals:Normal  ST&T Change: There is slight ST depression in lead 2 as well as the lateral leads V4 through V6.  Overall the morphology is similar to a prior  EKG from several years ago though the mild ST depression may represent slight demand ischemia.  There is no evidence of acute STEMI.  ____________________________________________  RADIOLOGY   Dg Chest 2 View  03/11/2015  CLINICAL DATA:  Cough for 5 days EXAM: CHEST - 2 VIEW COMPARISON:  03/06/2007 FINDINGS: Cardiac shadow is within normal limits. Prominent epicardial fat pad is again seen. Multiple calcified granulomas are again noted. No focal infiltrate or sizable effusion is seen. No bony abnormality is noted. IMPRESSION: No active disease. Electronically Signed   By: Inez Catalina M.D.   On: 03/11/2015 13:19   Ct Angio Chest Pe W/cm &/or Wo Cm  03/11/2015  CLINICAL DATA:  Shortness of breath, productive cough with brown sputum. EXAM: CT ANGIOGRAPHY CHEST WITH CONTRAST TECHNIQUE: Multidetector CT imaging of the chest was performed using the standard protocol during bolus administration of intravenous contrast. Multiplanar CT image reconstructions and MIPs were obtained to evaluate the vascular anatomy. CONTRAST:  97mL OMNIPAQUE IOHEXOL 350 MG/ML SOLN COMPARISON:  Chest x-ray earlier today.  Chest CT 03/06/2007 FINDINGS: No filling defects in the pulmonary arteries to suggest pulmonary emboli. Heart is borderline in size. Coronary artery calcifications in the left anterior descending coronary artery. No mediastinal, hilar, or axillary adenopathy. Chest wall soft tissues are unremarkable. Small hiatal hernia. Mild emphysema. Calcified granuloma in the left upper lobe. There are reticulonodular tree-in-bud densities within the left upper lobe and left lower lobe suggesting small airways disease/ alveolitis. 13 mm nodule in the right lower lobe on image 94. No pleural effusions. Imaging into the upper abdomen shows no acute findings. Review of the MIP images confirms the above findings.  IMPRESSION: No evidence of pulmonary embolus. Tree-in-bud densities in the lingula and left lower lobe, likely small  airways disease/alveolitis. 13 mm nodule in the right lower lobe, new since 2008. This could be further characterize with PET CT. Coronary artery disease. Electronically Signed   By: Rolm Baptise M.D.   On: 03/11/2015 14:48    ____________________________________________   PROCEDURES  Procedure(s) performed: None  Critical Care performed: No ____________________________________________   INITIAL IMPRESSION / ASSESSMENT AND PLAN / ED COURSE  Pertinent labs & imaging results that were available during my care of the patient were reviewed by me and considered in my medical decision making (see chart for details).  Differential includes all life-threatening causes for SOB. This includes but is not exclusive to acute coronary syndrome, aortic dissection, pulmonary embolism, community-acquired pneumonia, pericarditis, COPD/asthma exacerbation, etc.  currently she is tachycardic but her blood pressure and her oxygenation was stable.  She continues to pressure lips and breathe more rapidly than usual but this may be due to anxiety as much as a physiological need to do so; she has good inspirations and expirations with clear lungs.  However given the history of worsening productive cough rotated two-view chest x-ray and have a low threshold for CT scan if she needs additional imaging.  Given her history of asthma and COPD I will go ahead and give her a DuoNeb which may also help with bronchospasm.  ----------------------------------------- 3:27 PM on 03/11/2015 -----------------------------------------  The patient remained slightly tachycardic and borderline tachypnea, but she states that she subjectively feels much better and she wants to go home.  I brought up the possibility of hospital admission and offered it to her but she and her husband both feel that they would prefer that she go home if at all possible.  Given the infection/inflammation visible on her CT scan, I am giving her a dose of  Decadron 10 mg IV and will anticipate discharge with antibiotics and close outpatient follow-up.  However, before I discharge her, I will have her do a 6 minute ambulation test with a pulse oximeter in place to determine her degree of tachypnea, dyspnea, and the presence of hypoxemia.  If she passes ambulation test I will discharge her as we discussed.  In terms of antibiosis, I we will treat with doxycycline, which would be a good choice for community-acquired pneumonia, safer than levofloxacin and broader spectrum than just azithromycin.  ----------------------------------------- 4:00 PM on 03/11/2015 -----------------------------------------  On the ambulation test, the patient's SPO2 dropped from 97% to 87%, her respiratory rate increased to 34, and her heart rate increased to 141.  Due to her hypoxemia and inability to tolerate even a moderate amount of ambulation, I will admit her given the abnormal CT scan findings consistent with alveolitis versus pneumonia.  I have already given her doxycycline and Decadron, but I will obtain blood cultures and for additional antibiosis to the hospitalist. ____________________________________________  FINAL CLINICAL IMPRESSION(S) / ED DIAGNOSES  Final diagnoses:  Community acquired pneumonia  Pulmonary alveolitis ([place])  Acute respiratory failure with hypoxemia ([place])      NEW MEDICATIONS STARTED DURING THIS VISIT:  New Prescriptions   No medications on file     Hinda Kehr, MD 03/11/15 [id number]

## 2015-03-11 NOTE — ED Notes (Signed)
Walked pt. for six minutes. Oxygen started at 97% and was at 87% at the end of the walk. Respirations at 32 per minute and tachycardia at 141.

## 2015-03-11 NOTE — H&P (Signed)
[place] [facility] [place] NAME: Anita Ferrell    MR#:  [id number]  DATE OF BIRTH:  1932-12-05  DATE OF ADMISSION:  03/11/2015  PRIMARY CARE PHYSICIAN: [name], MD   REQUESTING/REFERRING PHYSICIAN: Dr Karma Greaser  CHIEF COMPLAINT:   Shortness of breath and low oxygen sats HISTORY OF PRESENT ILLNESS:  Anita Ferrell  is a 81 y.o. female with a known history of Emphysema not on any home oxygen,Pulmonary hypertension, history of TB in the past, GERD comes to the emergency room from his her primary care physician's office because of increasing shortness of breath since past Thursday. Patient was unable to complete short sentences in the PCPs office her sats were in the lower 90s. She was sent via EMS to the emergency room Workup in the ER showed patient has COPD exacerbation with hypoxia. She received Solu-Medrol, nebulizer treatment, by mouth doxycycline. She was ambulated in the ER and on room air her sats dropped down to 87%. Patient is being thereby admitted for acute hypoxic respiratory failure due to COPD exacerbation and hypoxia.  PAST MEDICAL HISTORY:   Past Medical History  Diagnosis Date  . Allergy   . COPD (chronic obstructive pulmonary disease) ([place])   . Hypercholesteremia   . TB (pulmonary tuberculosis)     treated with INH therapy   . Pulmonary hypertension (HCC)     Right Ventricular systolic pressure at 60 mm Hg by echo on july of 2011; Right heart cardic catherization revealed pulmonary artery pressusre of 27/10 with a mean of; Ventricular pressure 29/10 with pulmonary capillary wedge at 9  . GERD (gastroesophageal reflux disease)   . Hypertension   . Asthma     PAST SURGICAL HISTOIRY:   Past Surgical History  Procedure Laterality Date  . Tonsillectomy  1964  . Cholecystectomy  2010  . Cataract extraction Bilateral 2008    SOCIAL HISTORY:   Social History  Substance Use Topics  . Smoking status:  Former Research scientist (life sciences)  . Smokeless tobacco: Never Used     Comment: Quit in 1992  . Alcohol Use: No    FAMILY HISTORY:   Family History  Problem Relation Age of Onset  . Heart disease Mother   . Heart disease Father   . Hypertension Sister   . Heart disease Brother     DRUG ALLERGIES:   Allergies  Allergen Reactions  . Penicillins Swelling and Other (See Comments)    Has patient had a PCN reaction causing immediate rash, facial/tongue/throat swelling, SOB or lightheadedness with hypotension: Yes Has patient had a PCN reaction causing severe rash involving mucus membranes or skin necrosis: No Has patient had a PCN reaction that required hospitalization No Has patient had a PCN reaction occurring within the last 10 years: No If all of the above answers are "NO", then may proceed with Cephalosporin use.    REVIEW OF SYSTEMS:  Review of Systems  Constitutional: Negative for fever, chills and weight loss.  HENT: Negative for ear discharge, ear pain and nosebleeds.   Eyes: Negative for blurred vision, pain and discharge.  Respiratory: Positive for cough, sputum production and shortness of breath. Negative for wheezing and stridor.   Cardiovascular: Negative for chest pain, palpitations, orthopnea and PND.  Gastrointestinal: Negative for nausea, vomiting, abdominal pain and diarrhea.  Genitourinary: Negative for urgency and frequency.  Musculoskeletal: Negative for back pain and joint pain.  Neurological: Positive for weakness. Negative for sensory change, speech change and focal weakness.  Psychiatric/Behavioral: Negative for depression and hallucinations. The patient is not nervous/anxious.   All other systems reviewed and are negative.    MEDICATIONS AT HOME:   Prior to Admission medications   Medication Sig Start Date End Date Taking? Authorizing Provider  acetaminophen (TYLENOL) 500 MG tablet Take 1,000 mg by mouth every 6 (six) hours as needed for mild pain.   Yes Historical  Provider, MD  albuterol (PROVENTIL HFA;VENTOLIN HFA) 108 (90 BASE) MCG/ACT inhaler Inhale 2 puffs into the lungs every 4 (four) hours as needed for wheezing or shortness of breath.    Yes Historical Provider, MD  amLODipine (NORVASC) 5 MG tablet Take 1 tablet (5 mg total) by mouth daily. 06/21/12  Yes [name], MD  aspirin EC 81 MG tablet Take 81 mg by mouth daily.    Yes Historical Provider, MD  azelastine (ASTELIN) 0.1 % nasal spray Place 1 spray into both nostrils 2 (two) times daily.   Yes Historical Provider, MD  Calcium Carbonate-Vitamin D (CALCIUM 600+D) 600-400 MG-UNIT tablet Take 1 tablet by mouth at bedtime.   Yes Historical Provider, MD  cholecalciferol (VITAMIN D) 1000 UNITS tablet Take 1,000 Units by mouth at bedtime.   Yes Historical Provider, MD  ferrous sulfate 325 (65 FE) MG tablet Take 325 mg by mouth at bedtime.   Yes Historical Provider, MD  fexofenadine (ALLEGRA) 180 MG tablet Take 180 mg by mouth daily.    Yes Historical Provider, MD  Fluticasone-Salmeterol (ADVAIR DISKUS) 500-50 MCG/DOSE AEPB Inhale 1 puff into the lungs 2 (two) times daily. 03/05/14  Yes [name], MD  guaiFENesin (MUCINEX) 600 MG 12 hr tablet Take 600 mg by mouth 2 (two) times daily.    Yes Historical Provider, MD  lansoprazole (PREVACID) 30 MG capsule Take 30 mg by mouth daily.    Yes Historical Provider, MD  Multiple Vitamin (MULTIVITAMIN WITH MINERALS) TABS tablet Take 1 tablet by mouth daily.   Yes Historical Provider, MD  predniSONE (DELTASONE) 5 MG tablet Take 5 mg by mouth daily.    Yes Historical Provider, MD  theophylline (UNIPHYL) 400 MG 24 hr tablet Take 400 mg by mouth daily.   Yes Historical Provider, MD  tiotropium (SPIRIVA) 18 MCG inhalation capsule Place 18 mcg into inhaler and inhale daily.   Yes Historical Provider, MD      VITAL SIGNS:  Blood pressure 147/81, pulse 141, temperature 98.3 F (36.8 C), temperature source Oral, resp. rate 32, height 5\' 3"  (1.6 m), weight 73.029 kg  (161 lb), SpO2 87 %.  PHYSICAL EXAMINATION:  GENERAL:  81 y.o.-year-old patient lying in the bed with no acute distress.  EYES: Pupils equal, round, reactive to light and accommodation. No scleral icterus. Extraocular muscles intact.  HEENT: Head atraumatic, normocephalic. Oropharynx and nasopharynx clear.  NECK:  Supple, no jugular venous distention. No thyroid enlargement, no tenderness.  LUNGS:Distant breath sounds bilaterally, no wheezing, rales,rhonchi or crepitation. No use of accessory muscles of respiration.  CARDIOVASCULAR: S1, S2 normal. No murmurs, rubs, or gallops.  ABDOMEN: Soft, nontender, nondistended. Bowel sounds present. No organomegaly or mass.  EXTREMITIES: No pedal edema, cyanosis, or clubbing.  NEUROLOGIC: Cranial nerves II through XII are intact. Muscle strength 5/5 in all extremities. Sensation intact. Gait not checked.  PSYCHIATRIC: The patient is alert and oriented x 3.  SKIN: No obvious rash, lesion, or ulcer.   LABORATORY PANEL:   CBC  Recent Labs Lab 03/11/15 1155  WBC 6.5  HGB 12.3  HCT 38.2  PLT 201   ------------------------------------------------------------------------------------------------------------------  Chemistries   Recent Labs Lab 03/11/15 1155  NA 142  K 3.7  CL 108  CO2 25  GLUCOSE 127*  BUN 16  CREATININE 1.09*  CALCIUM 9.3  MG 1.9  AST 30  ALT 22  ALKPHOS 60  BILITOT 0.4   ------------------------------------------------------------------------------------------------------------------  Cardiac Enzymes  Recent Labs Lab 03/11/15 1155  TROPONINI <0.03   ------------------------------------------------------------------------------------------------------------------  RADIOLOGY:  Dg Chest 2 View  03/11/2015  CLINICAL DATA:  Cough for 5 days EXAM: CHEST - 2 VIEW COMPARISON:  03/06/2007 FINDINGS: Cardiac shadow is within normal limits. Prominent epicardial fat pad is again seen. Multiple calcified granulomas are  again noted. No focal infiltrate or sizable effusion is seen. No bony abnormality is noted. IMPRESSION: No active disease. Electronically Signed   By: Inez Catalina M.D.   On: 03/11/2015 13:19   Ct Angio Chest Pe W/cm &/or Wo Cm  03/11/2015  CLINICAL DATA:  Shortness of breath, productive cough with brown sputum. EXAM: CT ANGIOGRAPHY CHEST WITH CONTRAST TECHNIQUE: Multidetector CT imaging of the chest was performed using the standard protocol during bolus administration of intravenous contrast. Multiplanar CT image reconstructions and MIPs were obtained to evaluate the vascular anatomy. CONTRAST:  76mL OMNIPAQUE IOHEXOL 350 MG/ML SOLN COMPARISON:  Chest x-ray earlier today.  Chest CT 03/06/2007 FINDINGS: No filling defects in the pulmonary arteries to suggest pulmonary emboli. Heart is borderline in size. Coronary artery calcifications in the left anterior descending coronary artery. No mediastinal, hilar, or axillary adenopathy. Chest wall soft tissues are unremarkable. Small hiatal hernia. Mild emphysema. Calcified granuloma in the left upper lobe. There are reticulonodular tree-in-bud densities within the left upper lobe and left lower lobe suggesting small airways disease/ alveolitis. 13 mm nodule in the right lower lobe on image 94. No pleural effusions. Imaging into the upper abdomen shows no acute findings. Review of the MIP images confirms the above findings. IMPRESSION: No evidence of pulmonary embolus. Tree-in-bud densities in the lingula and left lower lobe, likely small airways disease/alveolitis. 13 mm nodule in the right lower lobe, new since 2008. This could be further characterize with PET CT. Coronary artery disease. Electronically Signed   By: Rolm Baptise M.D.   On: 03/11/2015 14:48    EKG:   Sinus tachycardia IMPRESSION AND PLAN:   Jelaine Youngblood  is a 81 y.o. female with a known history of Emphysema not on any home oxygen,Pulmonary hypertension, history of TB in the past, GERD comes  to the emergency room from his her primary care physician's office because of increasing shortness of breath since past Thursday. Patient was unable to complete short sentences in the PCPs office her sats were in the lower 90s.   1. Acute hypoxic respiratory failure Due to COPD exacerbation Admitted to the floor IV Solu-Medrol 60 mg daily Doxycycline 100 mg twice a day Nebulizer, inhalers, oxygen. Assessed for home oxygen need  2. Hypertension continue home meds  3.Hyperlipidemia on statins  4. DVT prophylaxis subcutaneous Lovenox    All the records are reviewed and case discussed with ED provider. Management plans discussed with the patient, family and they are in agreement.  CODE STATUS: Full  TOTAL TIME TAKING CARE OF THIS PATIENT: 11minutes.    Leny Morozov M.D on 03/11/2015 at 5:13 PM  Between 7am to 6pm - Pager - [phone]  After 6pm go to [email or web address] - password [email or web address] [facility]  Office  [phone]  CC: Primary care physician; [name], MD

## 2015-03-12 DIAGNOSIS — R0600 Dyspnea, unspecified: Secondary | ICD-10-CM

## 2015-03-12 DIAGNOSIS — J449 Chronic obstructive pulmonary disease, unspecified: Secondary | ICD-10-CM

## 2015-03-12 DIAGNOSIS — R0902 Hypoxemia: Secondary | ICD-10-CM

## 2015-03-12 DIAGNOSIS — R911 Solitary pulmonary nodule: Secondary | ICD-10-CM

## 2015-03-12 MED ORDER — THEOPHYLLINE ER 200 MG PO CP24
400.0000 mg | ORAL_CAPSULE | Freq: Every day | ORAL | Status: DC
  Administered 2015-03-12 – 2015-03-14 (×3): 400 mg via ORAL
  Filled 2015-03-12 (×3): qty 2

## 2015-03-12 MED ORDER — BENZONATATE 100 MG PO CAPS
200.0000 mg | ORAL_CAPSULE | Freq: Three times a day (TID) | ORAL | Status: DC | PRN
  Administered 2015-03-12 – 2015-03-13 (×2): 200 mg via ORAL
  Filled 2015-03-12 (×3): qty 2

## 2015-03-12 MED ORDER — AMLODIPINE BESYLATE 10 MG PO TABS
10.0000 mg | ORAL_TABLET | Freq: Every day | ORAL | Status: DC
  Administered 2015-03-13 – 2015-03-14 (×2): 10 mg via ORAL
  Filled 2015-03-12 (×2): qty 1

## 2015-03-12 MED ORDER — CETYLPYRIDINIUM CHLORIDE 0.05 % MT LIQD
7.0000 mL | Freq: Two times a day (BID) | OROMUCOSAL | Status: DC
  Administered 2015-03-13 (×2): 7 mL via OROMUCOSAL

## 2015-03-12 NOTE — Progress Notes (Signed)
Orders for Bipap at nightime. Patient complains of moderate cough and prefers not to wear bipap due to coughing.

## 2015-03-12 NOTE — Plan of Care (Signed)
Problem: Respiratory: Goal: Ability to maintain a clear airway will improve Outcome: Progressing Pt encouraged to cough and deep breathe.  Pt has had a productive cough throughout the night.  PO Mucinex bid, Theophylline ordered for this am.  Goal: Levels of oxygenation will improve Outcome: Progressing Oxygen at 2 liters/minute.    Goal: Complications related to the disease process, condition or treatment will be avoided or minimized Outcome: Progressing Pt admitted around shift change yesterday evening for COPD. Lives at home with her son. Chief complaint: shortness of breath since Thursday. No complaints of pain. Productive cough. One liter of IVF given x one. Up with one person assist. Dyspneic on exertion. Bronchial hygiene encouraged.

## 2015-03-12 NOTE — Plan of Care (Signed)
Problem: Education: Goal: Knowledge of [facility] Education information/materials will improve Outcome: Progressing [facility] admission packet given to patient. Explanation given on fall policy (calling for assistance using the As[employer] phone system and how to use the call bell). Pt and son verbalized understanding.   Problem: Safety: Goal: Ability to remain free from injury will improve Outcome: Progressing Pt calls for assistance to the bathroom  Problem: Health Behavior/Discharge Planning: Goal: Ability to manage health-related needs will improve Outcome: Progressing Pt knows what medications she is on and why she is on them. Will continue to assess.

## 2015-03-12 NOTE — Care Management (Signed)
Admitted to [facility] with the diagnosis of COPD. Lives with son, Shanon Brow, [phone]). Sent from Dr. Marene Lenz office yesterday to the emergency room at this hospital.  Seen Dr. [name] in September. No home health. No skilled facility. No home oxygen. Uses no aids for ambulation.  No Life Alert. Takes care of both basic and instrumental activities of daily living herself, drives. No falls. Appetite "too good." Son will transport. Shelbie Ammons RN MSN CCM Care Management [phone]

## 2015-03-12 NOTE — Progress Notes (Signed)
[place] [facility] [place] NAME: Anita Ferrell    MR#:  [id number]  DATE OF BIRTH:  10/20/1932  SUBJECTIVE: Admitted for shortness of breath, hypoxia. O2 sats were 87% on room air on arrival. Not on oxygen at home. She feels better but still had shortness of breath today.   CHIEF COMPLAINT:   Chief Complaint  Patient presents with  . Shortness of Breath    REVIEW OF SYSTEMS:    Review of Systems  Constitutional: Negative for fever and chills.  HENT: Negative for hearing loss and sore throat.   Eyes: Negative for blurred vision, double vision and photophobia.  Respiratory: Positive for shortness of breath. Negative for cough, hemoptysis and sputum production.   Cardiovascular: Negative for chest pain, palpitations, orthopnea and leg swelling.  Gastrointestinal: Negative for vomiting, abdominal pain and diarrhea.  Genitourinary: Negative for dysuria and urgency.  Musculoskeletal: Negative for myalgias and neck pain.  Skin: Negative for rash.  Neurological: Negative for dizziness, focal weakness, seizures, weakness and headaches.  Psychiatric/Behavioral: Negative for memory loss. The patient does not have insomnia.     Nutrition:  Tolerating Diet: Tolerating PT:      DRUG ALLERGIES:   Allergies  Allergen Reactions  . Penicillins Swelling and Other (See Comments)    Has patient had a PCN reaction causing immediate rash, facial/tongue/throat swelling, SOB or lightheadedness with hypotension: Yes Has patient had a PCN reaction causing severe rash involving mucus membranes or skin necrosis: No Has patient had a PCN reaction that required hospitalization No Has patient had a PCN reaction occurring within the last 10 years: No If all of the above answers are "NO", then may proceed with Cephalosporin use.    VITALS:  Blood pressure 134/76, pulse 90, temperature 97.8 F (36.6 C), temperature source Oral, resp. rate 20, height 5'  3" (1.6 m), weight 73.029 kg (161 lb), SpO2 96 %.  PHYSICAL EXAMINATION:   Physical Exam  GENERAL:  81 y.o.-year-old patient lying in the bed with no acute distress.  EYES: Pupils equal, round, reactive to light and accommodation. No scleral icterus. Extraocular muscles intact.  HEENT: Head atraumatic, normocephalic. Oropharynx and nasopharynx clear.  NECK:  Supple, no jugular venous distention. No thyroid enlargement, no tenderness.  LUNGS: Normal breath sounds bilaterally, no wheezing, rales,rhonchi or crepitation. No use of accessory muscles of respiration.  CARDIOVASCULAR: S1, S2 normal. No murmurs, rubs, or gallops.  ABDOMEN: Soft, nontender, nondistended. Bowel sounds present. No organomegaly or mass.  EXTREMITIES: No pedal edema, cyanosis, or clubbing.  NEUROLOGIC: Cranial nerves II through XII are intact. Muscle strength 5/5 in all extremities. Sensation intact. Gait not checked.  PSYCHIATRIC: The patient is alert and oriented x 3.  SKIN: No obvious rash, lesion, or ulcer.    LABORATORY PANEL:   CBC  Recent Labs Lab 03/11/15 1155  WBC 6.5  HGB 12.3  HCT 38.2  PLT 201   ------------------------------------------------------------------------------------------------------------------  Chemistries   Recent Labs Lab 03/11/15 1155  NA 142  K 3.7  CL 108  CO2 25  GLUCOSE 127*  BUN 16  CREATININE 1.09*  CALCIUM 9.3  MG 1.9  AST 30  ALT 22  ALKPHOS 60  BILITOT 0.4   ------------------------------------------------------------------------------------------------------------------  Cardiac Enzymes  Recent Labs Lab 03/11/15 1155  TROPONINI <0.03   ------------------------------------------------------------------------------------------------------------------  RADIOLOGY:  Dg Chest 2 View  03/11/2015  CLINICAL DATA:  Cough for 5 days EXAM: CHEST - 2 VIEW COMPARISON:  03/06/2007 FINDINGS: Cardiac shadow  is within normal limits. Prominent epicardial fat  pad is again seen. Multiple calcified granulomas are again noted. No focal infiltrate or sizable effusion is seen. No bony abnormality is noted. IMPRESSION: No active disease. Electronically Signed   By: Inez Catalina M.D.   On: 03/11/2015 13:19   Ct Angio Chest Pe W/cm &/or Wo Cm  03/11/2015  CLINICAL DATA:  Shortness of breath, productive cough with brown sputum. EXAM: CT ANGIOGRAPHY CHEST WITH CONTRAST TECHNIQUE: Multidetector CT imaging of the chest was performed using the standard protocol during bolus administration of intravenous contrast. Multiplanar CT image reconstructions and MIPs were obtained to evaluate the vascular anatomy. CONTRAST:  81mL OMNIPAQUE IOHEXOL 350 MG/ML SOLN COMPARISON:  Chest x-ray earlier today.  Chest CT 03/06/2007 FINDINGS: No filling defects in the pulmonary arteries to suggest pulmonary emboli. Heart is borderline in size. Coronary artery calcifications in the left anterior descending coronary artery. No mediastinal, hilar, or axillary adenopathy. Chest wall soft tissues are unremarkable. Small hiatal hernia. Mild emphysema. Calcified granuloma in the left upper lobe. There are reticulonodular tree-in-bud densities within the left upper lobe and left lower lobe suggesting small airways disease/ alveolitis. 13 mm nodule in the right lower lobe on image 94. No pleural effusions. Imaging into the upper abdomen shows no acute findings. Review of the MIP images confirms the above findings. IMPRESSION: No evidence of pulmonary embolus. Tree-in-bud densities in the lingula and left lower lobe, likely small airways disease/alveolitis. 13 mm nodule in the right lower lobe, new since 2008. This could be further characterize with PET CT. Coronary artery disease. Electronically Signed   By: Rolm Baptise M.D.   On: 03/11/2015 14:48     ASSESSMENT AND PLAN:   Active Problems:   COPD with hypoxia ([place])  1.Acute hypoxic  respiratory failure: Patient still has shortness of breath, CT of  the chest is negative for pulmonary emboli. Does have  Alveolitis. Continue Solu-Medrol, oxygen, nebulizers, antibiotics. 2. Right lower lobe nodule:Obtain pulmonary consult for evaluation.pt follows up with DR.[name]. 3. Accelerated hypertension secondary to shortness of breath: Increase amlodipine to 10 mg daily. #4 history of COPD: Continue Spiriva, theophylline. GERD ; continue PPIs DVT prophylaxis with Lovenox.   All the records are reviewed and case discussed with Care Management/Social Workerr. Management plans discussed with the patient, family and they are in agreement.  CODE STATUS: full  TOTAL TIME TAKING CARE OF THIS PATIENT: 35 minutes.   POSSIBLE D/C IN 1-2 DAYS, DEPENDING ON CLINICAL CONDITION.   Epifanio Lesches M.D on 03/12/2015 at 7:49 AM  Between 7am to 6pm - Pager - [phone]  After 6pm go to [email or web address] - password [email or web address] [facility]  Office  [phone]  CC: Primary care physician; [name], MD

## 2015-03-12 NOTE — Consult Note (Signed)
PULMONARY / CRITICAL CARE MEDICINE   Name: SUNNYE Ferrell MRN: [id number] DOB: 06/11/1932    ADMISSION DATE:  03/11/2015 CONSULTATION DATE:  03/13/15  REFERRING MD :  Dr. Vianne Bulls   CHIEF COMPLAINT:     Short of breath, low oxygen   HISTORY OF PRESENT ILLNESS   81 y.o. female with a known history of Emphysema/COPD stage II not on any home oxygen,Pulmonary hypertension, history of TB in the past, GERD comes to the emergency room fromher primary care physician's office because of increasing shortness of breath since past Thursday. Patient was unable to complete short sentences in the PCPs office her sats were in the lower 90s. She was sent via EMS to the emergency room on 03/11/15 Hospitalist are treating for COPD exacerbation with hypoxia. She received Solu-Medrol, nebulizer treatment, doxycycline. She was ambulated in the ER and on room air her sats dropped down to 87%. Patient is was admitted for acute hypoxic respiratory failure due to COPD exacerbation and hypoxia.  Follow up CTA Chest showed no pulmonary embolus but tree and bud densities in the left upper lobe along with with left lower lobe, and a 27mm right lower lobe nodule.  Pulmonary was consulted.     SIGNIFICANT EVENTS     PAST MEDICAL HISTORY    :  Past Medical History  Diagnosis Date  . Allergy   . COPD (chronic obstructive pulmonary disease) ([place])   . Hypercholesteremia   . TB (pulmonary tuberculosis)     treated with INH therapy   . Pulmonary hypertension (H[facility])     Right Ventricular systolic pressure at 60 mm Hg by echo on july of 2011; Right heart cardic catherization revealed pulmonary artery pressusre of 27/10 with a mean of; Ventricular pressure 29/10 with pulmonary capillary wedge at 9  . GERD (gastroesophageal reflux disease)   . Hypertension   . Asthma    Past Surgical History  Procedure Laterality Date  . Tonsillectomy  1964  . Cataract extraction Bilateral 2008  . Cholecystectomy     Prior  to Admission medications   Medication Sig Start Date End Date Taking? Authorizing Provider  acetaminophen (TYLENOL) 500 MG tablet Take 1,000 mg by mouth every 6 (six) hours as needed for mild pain.   Yes Historical Provider, MD  albuterol (PROVENTIL HFA;VENTOLIN HFA) 108 (90 BASE) MCG/ACT inhaler Inhale 2 puffs into the lungs every 4 (four) hours as needed for wheezing or shortness of breath.    Yes Historical Provider, MD  amLODipine (NORVASC) 5 MG tablet Take 1 tablet (5 mg total) by mouth daily. 06/21/12  Yes [name], MD  aspirin EC 81 MG tablet Take 81 mg by mouth daily.    Yes Historical Provider, MD  azelastine (ASTELIN) 0.1 % nasal spray Place 1 spray into both nostrils 2 (two) times daily.   Yes Historical Provider, MD  Calcium Carbonate-Vitamin D (CALCIUM 600+D) 600-400 MG-UNIT tablet Take 1 tablet by mouth at bedtime.   Yes Historical Provider, MD  cholecalciferol (VITAMIN D) 1000 UNITS tablet Take 1,000 Units by mouth at bedtime.   Yes Historical Provider, MD  ferrous sulfate 325 (65 FE) MG tablet Take 325 mg by mouth at bedtime.   Yes Historical Provider, MD  fexofenadine (ALLEGRA) 180 MG tablet Take 180 mg by mouth daily.    Yes Historical Provider, MD  Fluticasone-Salmeterol (ADVAIR DISKUS) 500-50 MCG/DOSE AEPB Inhale 1 puff into the lungs 2 (two) times daily. 03/05/14  Yes [name], MD  guaiFENesin ([place]) 600 MG  12 hr tablet Take 600 mg by mouth 2 (two) times daily.    Yes Historical Provider, MD  lansoprazole (PREVACID) 30 MG capsule Take 30 mg by mouth daily.    Yes Historical Provider, MD  Multiple Vitamin (MULTIVITAMIN WITH MINERALS) TABS tablet Take 1 tablet by mouth daily.   Yes Historical Provider, MD  predniSONE (DELTASONE) 5 MG tablet Take 5 mg by mouth daily.    Yes Historical Provider, MD  theophylline (UNIPHYL) 400 MG 24 hr tablet Take 400 mg by mouth daily.   Yes Historical Provider, MD  tiotropium (SPIRIVA) 18 MCG inhalation capsule Place 18 mcg into  inhaler and inhale daily.   Yes Historical Provider, MD   Allergies  Allergen Reactions  . Penicillins Swelling and Other (See Comments)    Has patient had a PCN reaction causing immediate rash, facial/tongue/throat swelling, SOB or lightheadedness with hypotension: Yes Has patient had a PCN reaction causing severe rash involving mucus membranes or skin necrosis: No Has patient had a PCN reaction that required hospitalization No Has patient had a PCN reaction occurring within the last 10 years: No If all of the above answers are "NO", then may proceed with Cephalosporin use.     FAMILY HISTORY   Family History  Problem Relation Age of Onset  . Heart disease Mother   . Heart disease Father   . Hypertension Sister   . Heart disease Brother       SOCIAL HISTORY    reports that she has quit smoking. She has never used smokeless tobacco. She reports that she does not drink alcohol or use illicit drugs.  Review of Systems  Constitutional: Negative for fever and chills.  HENT: Negative for hearing loss.   Respiratory: Positive for cough, sputum production and shortness of breath.   Cardiovascular: Negative for chest pain.  Gastrointestinal: Negative for heartburn, nausea, vomiting and diarrhea.  Genitourinary: Negative for dysuria.  Skin: Negative for rash.  Neurological: Negative for dizziness and headaches.  Endo/Heme/Allergies: Does not bruise/bleed easily.  Psychiatric/Behavioral: Negative for depression.      VITAL SIGNS    Temp:  [97.5 F (36.4 C)-98.2 F (36.8 C)] 97.5 F (36.4 C) (11/23 1340) Pulse Rate:  [90-106] 106 (11/23 1340) Resp:  [14-20] 20 (11/23 1340) BP: (134-177)/(72-91) 160/72 mmHg (11/23 1340) SpO2:  [95 %-100 %] 98 % (11/23 1340) HEMODYNAMICS:   VENTILATOR SETTINGS:   INTAKE / OUTPUT:  Intake/Output Summary (Last 24 hours) at 03/12/15 1645 Last data filed at 03/12/15 1119  Gross per 24 hour  Intake    600 ml  Output      0 ml  Net     600 ml       PHYSICAL EXAM   Physical Exam  Constitutional: She is oriented to person, place, and time. She appears well-nourished.  HENT:  Head: Normocephalic and atraumatic.  Right Ear: External ear normal.  Left Ear: External ear normal.  Nose: Nose normal.  Eyes: Conjunctivae are normal. Pupils are equal, round, and reactive to light.  Neck: Normal range of motion. Neck supple.  Cardiovascular: Normal rate, regular rhythm, normal heart sounds and intact distal pulses.   Pulmonary/Chest: Effort normal.  No resp distress Good airway entry down to the bases,  No wheezes  Abdominal: Soft. Bowel sounds are normal.  Musculoskeletal: Normal range of motion.  Neurological: She is alert and oriented to person, place, and time.  Skin: Skin is warm and dry.       LABS  LABS:  CBC  Recent Labs Lab 03/11/15 1155  WBC 6.5  HGB 12.3  HCT 38.2  PLT 201   Coag's No results for input(s): APTT, INR in the last 168 hours. BMET  Recent Labs Lab 03/11/15 1155  NA 142  K 3.7  CL 108  CO2 25  BUN 16  CREATININE 1.09*  GLUCOSE 127*   Electrolytes  Recent Labs Lab 03/11/15 1155  CALCIUM 9.3  MG 1.9   Sepsis Markers No results for input(s): LATICACIDVEN, PROCALCITON, O2SATVEN in the last 168 hours. ABG No results for input(s): PHART, PCO2ART, PO2ART in the last 168 hours. Liver Enzymes  Recent Labs Lab 03/11/15 1155  AST 30  ALT 22  ALKPHOS 60  BILITOT 0.4  ALBUMIN 3.8   Cardiac Enzymes  Recent Labs Lab 03/11/15 1155  TROPONINI <0.03   Glucose No results for input(s): GLUCAP in the last 168 hours.   Recent Results (from the past 240 hour(s))  Blood culture (routine x 2)     Status: None (Preliminary result)   Collection Time: 03/11/15  4:46 PM  Result Value Ref Range Status   Specimen Description BLOOD LEFT FATTY CASTS  Final   Special Requests Normal  Final   Culture NO GROWTH < 24 HOURS  Final   Report Status PENDING  Incomplete  Blood  culture (routine x 2)     Status: None (Preliminary result)   Collection Time: 03/11/15  4:46 PM  Result Value Ref Range Status   Specimen Description BLOOD RIGHT ASSIST CONTROL  Final   Special Requests   Final    BOTTLES DRAWN AEROBIC AND ANAEROBIC [place] AERO 5CC ANA   Culture NO GROWTH < 24 HOURS  Final   Report Status PENDING  Incomplete     Current facility-administered medications:  .  0.9 %  sodium chloride infusion, 250 mL, Intravenous, PRN, Fritzi Mandes, MD .  acetaminophen (TYLENOL) tablet 650 mg, 650 mg, Oral, Q6H PRN **OR** acetaminophen (TYLENOL) suppository 650 mg, 650 mg, Rectal, Q6H PRN, Fritzi Mandes, MD .  acetaminophen (TYLENOL) tablet 1,000 mg, 1,000 mg, Oral, Q6H PRN, Fritzi Mandes, MD .  albuterol (PROVENTIL) (2.5 MG/3ML) 0.083% nebulizer solution 2.5 mg, 2.5 mg, Nebulization, Q2H PRN, Fritzi Mandes, MD .  Derrill Memo ON 03/13/2015] amLODipine (NORVASC) tablet 10 mg, 10 mg, Oral, Daily, Epifanio Lesches, MD .  aspirin EC tablet 81 mg, 81 mg, Oral, Daily, Fritzi Mandes, MD, 81 mg at 03/12/15 0748 .  azelastine (ASTELIN) 0.1 % nasal spray 1 spray, 1 spray, Each Nare, BID, Fritzi Mandes, MD, 1 spray at 03/12/15 0747 .  calcium-vitamin D (OSCAL WITH D) 500-200 MG-UNIT per tablet 1 tablet, 1 tablet, Oral, QHS, Fritzi Mandes, MD, 1 tablet at 03/11/15 2106 .  cholecalciferol (VITAMIN D) tablet 1,000 Units, 1,000 Units, Oral, QHS, Fritzi Mandes, MD, 1,000 Units at 03/11/15 2106 .  doxycycline (VIBRA-TABS) tablet 100 mg, 100 mg, Oral, Q12H, Fritzi Mandes, MD, 100 mg at 03/12/15 0755 .  enoxaparin (LOVENOX) injection 40 mg, 40 mg, Subcutaneous, Q24H, Fritzi Mandes, MD, 40 mg at 03/11/15 2100 .  ferrous sulfate tablet 325 mg, 325 mg, Oral, QHS, Fritzi Mandes, MD, 325 mg at 03/11/15 2106 .  guaiFENesin (MUCINEX) 12 hr tablet 600 mg, 600 mg, Oral, BID, Fritzi Mandes, MD, 600 mg at 03/12/15 0748 .  loratadine (CLARITIN) tablet 10 mg, 10 mg, Oral, Daily, Fritzi Mandes, MD, 10 mg at 03/12/15 0748 .  methylPREDNISolone sodium  succinate (SOLU-MEDROL) 125 mg/2 mL injection 60 mg, 60  mg, Intravenous, Q24H, Fritzi Mandes, MD, 60 mg at 03/11/15 2059 .  mometasone-formoterol (DULERA) 200-5 MCG/ACT inhaler 2 puff, 2 puff, Inhalation, BID, Fritzi Mandes, MD, 2 puff at 03/12/15 0747 .  multivitamin with minerals tablet 1 tablet, 1 tablet, Oral, Daily, Fritzi Mandes, MD, 1 tablet at 03/12/15 0748 .  ondansetron (ZOFRAN) tablet 4 mg, 4 mg, Oral, Q6H PRN **OR** ondansetron (ZOFRAN) injection 4 mg, 4 mg, Intravenous, Q6H PRN, Fritzi Mandes, MD .  sodium chloride 0.9 % injection 3 mL, 3 mL, Intravenous, Q12H, Fritzi Mandes, MD, 3 mL at 03/12/15 0749 .  sodium chloride 0.9 % injection 3 mL, 3 mL, Intravenous, PRN, Fritzi Mandes, MD .  sodium chloride 0.9 % injection 3 mL, 3 mL, Intravenous, Q12H, Fritzi Mandes, MD, 3 mL at 03/12/15 0748 .  sodium chloride 0.9 % injection 3 mL, 3 mL, Intravenous, PRN, Fritzi Mandes, MD .  theophylline (THEO-24) 24 hr capsule 400 mg, 400 mg, Oral, Daily, Epifanio Lesches, MD, 400 mg at 03/12/15 0756 .  tiotropium (SPIRIVA) inhalation capsule 18 mcg, 18 mcg, Inhalation, Daily, Fritzi Mandes, MD, 18 mcg at 03/12/15 0747  IMAGING  Dg Chest 2 View  03/11/2015  CLINICAL DATA:  Cough for 5 days EXAM: CHEST - 2 VIEW COMPARISON:  03/06/2007 FINDINGS: Cardiac shadow is within normal limits. Prominent epicardial fat pad is again seen. Multiple calcified granulomas are again noted. No focal infiltrate or sizable effusion is seen. No bony abnormality is noted. IMPRESSION: No active disease. Electronically Signed   By: Inez Catalina M.D.   On: 03/11/2015 13:19   Ct Angio Chest Pe W/cm &/or Wo Cm  03/11/2015  CLINICAL DATA:  Shortness of breath, productive cough with brown sputum. EXAM: CT ANGIOGRAPHY CHEST WITH CONTRAST TECHNIQUE: Multidetector CT imaging of the chest was performed using the standard protocol during bolus administration of intravenous contrast. Multiplanar CT image reconstructions and MIPs were obtained to evaluate the  vascular anatomy. CONTRAST:  87mL OMNIPAQUE IOHEXOL 350 MG/ML SOLN COMPARISON:  Chest x-ray earlier today.  Chest CT 03/06/2007 FINDINGS: No filling defects in the pulmonary arteries to suggest pulmonary emboli. Heart is borderline in size. Coronary artery calcifications in the left anterior descending coronary artery. No mediastinal, hilar, or axillary adenopathy. Chest wall soft tissues are unremarkable. Small hiatal hernia. Mild emphysema. Calcified granuloma in the left upper lobe. There are reticulonodular tree-in-bud densities within the left upper lobe and left lower lobe suggesting small airways disease/ alveolitis. 13 mm nodule in the right lower lobe on image 94. No pleural effusions. Imaging into the upper abdomen shows no acute findings. Review of the MIP images confirms the above findings. IMPRESSION: No evidence of pulmonary embolus. Tree-in-bud densities in the lingula and left lower lobe, likely small airways disease/alveolitis. 13 mm nodule in the right lower lobe, new since 2008. This could be further characterize with PET CT. Coronary artery disease. Electronically Signed   By: Rolm Baptise M.D.   On: 03/11/2015 14:48     No results found.    Indwelling Urinary Catheter continued, requirement due to   Reason to continue Indwelling Urinary Catheter for strict Intake/Output monitoring for hemodynamic instability   Central Line continued, requirement due to   Reason to continue [employer] Monitoring of central venous pressure or other hemodynamic parameters   Ventilator continued, requirement due to, resp failure    Ventilator Sedation RASS 0 to -2   Cultures: BCx2  UC  Sputum  Antibiotics: Azithromycin Rocephin  Lines:   ASSESSMENT/PLAN  81 yo female with AECOPD, bronchitis\alveolitis LUL, RLL nodule,   1. AECOPD  - secondary to current infection of bronchitis, possible CAP - expand antibiotic coverage - recommend adding rocephin - cont with IV solumedrol  for a total of 3 days, then transition to prednisone 40mg  PO, taper over 2 weeks - cont with nebulizers - incentive spirometry - may benefit from bipap (min 4hrs at night, during inpt only).  - walk test to determine O2 needs prior to discharge - may have some deconditioning - resume home inhalers closer to discharge - her COPD is stage II, and was stable  Prior to admission - per Dr. [name] notes - f\u sputum culture  2. COPD  - cont with above regimen - follow up with primary pulmonologist upon discharge - Dr. [name]  3. RLL nodule - new - outpatient workup  - likely reactive, but will need at least a CT Chest in 3 months, this can be performed by her PMD or primary pulmonologist - no inpatient work needed at this time.     Primary Pulmonologist - Dr. [name].   I have personally obtained a history, examined the patient, evaluated laboratory and imaging results, formulated the assessment and plan and placed orders.  Pulmonary consult time - minutes  Vilinda Boehringer, MD [facility] Pager [phone] (please enter 7-digits) On Call Pager [phone] (please enter 7-digits)     03/12/2015, 4:45 PM  Note: This note was prepared with Dragon dictation along with smaller phrase technology. Any transcriptional errors that result from this process are unintentional.

## 2015-03-13 NOTE — Plan of Care (Signed)
Problem: Respiratory: Goal: Complications related to the disease process, condition or treatment will be avoided or minimized Outcome: Progressing Pt reports coughing more effective with small amount clear sputum produced. Improved breath sounds. Up to BR with 02 on and tolerated better with no acute distress. No complications noted. Continued 02 therapy with sats stable.  Problem: Education: Goal: Knowledge of [facility] Education information/materials will improve Outcome: Completed/Met Date Met:  03/13/15 Completed all general education.  Problem: Safety: Goal: Ability to remain free from injury will improve Outcome: Progressing No safety issues or injuries this shift.  Problem: Health Behavior/Discharge Planning: Goal: Ability to manage health-related needs will improve Outcome: Adequate for Discharge Pt verbalizes understanding of self care/meds for discharge needs.     

## 2015-03-13 NOTE — Progress Notes (Signed)
[facility] NAME: Anita Ferrell    MR#:  [id number]  DATE OF BIRTH:  Nov 21, 1932  SUBJECTIVE: Admitted for shortness of breath, hypoxia. O2 sats were 87% on room air on arrival. Not on oxygen at home. Little better today.  CHIEF COMPLAINT:   Chief Complaint  Patient presents with  . Shortness of Breath    REVIEW OF SYSTEMS:    Review of Systems  Constitutional: Negative for fever and chills.  HENT: Negative for hearing loss and sore throat.   Eyes: Negative for blurred vision, double vision and photophobia.  Respiratory: Positive for shortness of breath. Negative for cough, hemoptysis and sputum production.   Cardiovascular: Negative for chest pain, palpitations, orthopnea and leg swelling.  Gastrointestinal: Negative for vomiting, abdominal pain and diarrhea.  Genitourinary: Negative for dysuria and urgency.  Musculoskeletal: Negative for myalgias and neck pain.  Skin: Negative for rash.  Neurological: Negative for dizziness, focal weakness, seizures, weakness and headaches.  Psychiatric/Behavioral: Negative for memory loss. The patient does not have insomnia.     Nutrition:  Tolerating Diet: Tolerating PT:      DRUG ALLERGIES:   Allergies  Allergen Reactions  . Penicillins Swelling and Other (See Comments)    Has patient had a PCN reaction causing immediate rash, facial/tongue/throat swelling, SOB or lightheadedness with hypotension: Yes Has patient had a PCN reaction causing severe rash involving mucus membranes or skin necrosis: No Has patient had a PCN reaction that required hospitalization No Has patient had a PCN reaction occurring within the last 10 years: No If all of the above answers are "NO", then may proceed with Cephalosporin use.    VITALS:  Blood pressure 155/75, pulse 78, temperature 97.7 F (36.5 C), temperature source Oral, resp. rate 17, height 5\' 3"  (1.6 m), weight 73.029 kg (161 lb),  SpO2 100 %.  PHYSICAL EXAMINATION:   Physical Exam  GENERAL:  81 y.o.-year-old patient lying in the bed with no acute distress.  EYES: Pupils equal, round, reactive to light and accommodation. No scleral icterus. Extraocular muscles intact.  HEENT: Head atraumatic, normocephalic. Oropharynx and nasopharynx clear.  NECK:  Supple, no jugular venous distention. No thyroid enlargement, no tenderness.  LUNGS: Normal breath sounds bilaterally, no wheezing, rales,rhonchi or crepitation. No use of accessory muscles of respiration.  CARDIOVASCULAR: S1, S2 normal. No murmurs, rubs, or gallops.  ABDOMEN: Soft, nontender, nondistended. Bowel sounds present. No organomegaly or mass.  EXTREMITIES: No pedal edema, cyanosis, or clubbing.  NEUROLOGIC: Cranial nerves II through XII are intact. Muscle strength 5/5 in all extremities. Sensation intact. Gait not checked.  PSYCHIATRIC: The patient is alert and oriented x 3.  SKIN: No obvious rash, lesion, or ulcer.    LABORATORY PANEL:   CBC  Recent Labs Lab 03/11/15 1155  WBC 6.5  HGB 12.3  HCT 38.2  PLT 201   ------------------------------------------------------------------------------------------------------------------  Chemistries   Recent Labs Lab 03/11/15 1155  NA 142  K 3.7  CL 108  CO2 25  GLUCOSE 127*  BUN 16  CREATININE 1.09*  CALCIUM 9.3  MG 1.9  AST 30  ALT 22  ALKPHOS 60  BILITOT 0.4   ------------------------------------------------------------------------------------------------------------------  Cardiac Enzymes  Recent Labs Lab 03/11/15 1155  TROPONINI <0.03   ------------------------------------------------------------------------------------------------------------------  RADIOLOGY:  Dg Chest 2 View  03/11/2015  CLINICAL DATA:  Cough for 5 days EXAM: CHEST - 2 VIEW COMPARISON:  03/06/2007 FINDINGS: Cardiac shadow is within normal limits. Prominent epicardial fat pad  is again seen. Multiple calcified  granulomas are again noted. No focal infiltrate or sizable effusion is seen. No bony abnormality is noted. IMPRESSION: No active disease. Electronically Signed   By: Inez Catalina M.D.   On: 03/11/2015 13:19   Ct Angio Chest Pe W/cm &/or Wo Cm  03/11/2015  CLINICAL DATA:  Shortness of breath, productive cough with brown sputum. EXAM: CT ANGIOGRAPHY CHEST WITH CONTRAST TECHNIQUE: Multidetector CT imaging of the chest was performed using the standard protocol during bolus administration of intravenous contrast. Multiplanar CT image reconstructions and MIPs were obtained to evaluate the vascular anatomy. CONTRAST:  87mL OMNIPAQUE IOHEXOL 350 MG/ML SOLN COMPARISON:  Chest x-ray earlier today.  Chest CT 03/06/2007 FINDINGS: No filling defects in the pulmonary arteries to suggest pulmonary emboli. Heart is borderline in size. Coronary artery calcifications in the left anterior descending coronary artery. No mediastinal, hilar, or axillary adenopathy. Chest wall soft tissues are unremarkable. Small hiatal hernia. Mild emphysema. Calcified granuloma in the left upper lobe. There are reticulonodular tree-in-bud densities within the left upper lobe and left lower lobe suggesting small airways disease/ alveolitis. 13 mm nodule in the right lower lobe on image 94. No pleural effusions. Imaging into the upper abdomen shows no acute findings. Review of the MIP images confirms the above findings. IMPRESSION: No evidence of pulmonary embolus. Tree-in-bud densities in the lingula and left lower lobe, likely small airways disease/alveolitis. 13 mm nodule in the right lower lobe, new since 2008. This could be further characterize with PET CT. Coronary artery disease. Electronically Signed   By: Rolm Baptise M.D.   On: 03/11/2015 14:48     ASSESSMENT AND PLAN:   Active Problems:   COPD with hypoxia ([place])  1.Acute hypoxic  respiratory failure: Patient still has shortness of breath, CT of the chest is negative for pulmonary  emboli. Does have  Alveolitis. Continue Solu-Medrol, oxygen, nebulizers, antibiotics.clinically improving slowly.hypoxia is improving 2. Right lower lobe nodule:Obtain pulmonary consult for evaluation.pt follows up with DR.[name]. 3. Accelerated hypertension secondary to shortness of breath: Increase amlodipine to 10 mg daily.bp better today #4 history of COPD: Continue Spiriva, theophylline. GERD ; continue PPIs DVT prophylaxis with Lovenox.  Likely d/c am All the records are reviewed and case discussed with Care Management/Social Workerr. Management plans discussed with the patient, family and they are in agreement.  CODE STATUS: full  TOTAL TIME TAKING CARE OF THIS PATIENT: 35 minutes.   POSSIBLE D/C IN 1-2 DAYS, DEPENDING ON CLINICAL CONDITION.   Epifanio Lesches M.D on 03/13/2015 at 12:33 PM  Between 7am to 6pm - Pager - [phone]  After 6pm go to [email or web address] - password [email or web address] [facility]  Office  [phone]  CC: Primary care physician; [name], MD

## 2015-03-13 NOTE — Plan of Care (Signed)
Problem: Respiratory: Goal: Ability to maintain a clear airway will improve Outcome: Progressing Pt with oxygen at 1.5 to 2 liters. Bipap was ordered yesterday for patient to wear 4 hours during the night. Pt unable to tolerate due to coughing.  Goal: Levels of oxygenation will improve Outcome: Progressing Oxygen saturations wnl. Pt on 1.5 to 2 liters nasal cannula Goal: Complications related to the disease process, condition or treatment will be avoided or minimized Outcome: Progressing Pt with productive cough. Pt continued to cough so MD notified. [place] ordered. Cough improved. Dyspnea on exertion. Lung sounds diminished. Pt on doxycyline .   Problem: Safety: Goal: Ability to remain free from injury will improve Outcome: Progressing Pt high fall risk (15). Up with standby assist. Extension to oxygen so that patient can ambulate to BR without becoming extremely short of breath.

## 2015-03-14 LAB — CBC WITH DIFFERENTIAL/PLATELET
BASOS ABS: 0 10*3/uL (ref 0–0.1)
Basophils Relative: 0 %
EOS ABS: 0 10*3/uL (ref 0–0.7)
EOS PCT: 0 %
HCT: 36.6 % (ref 35.0–47.0)
Hemoglobin: 11.7 g/dL — ABNORMAL LOW (ref 12.0–16.0)
Lymphocytes Relative: 14 %
Lymphs Abs: 0.7 10*3/uL — ABNORMAL LOW (ref 1.0–3.6)
MCH: 26.6 pg (ref 26.0–34.0)
MCHC: 32 g/dL (ref 32.0–36.0)
MCV: 83.2 fL (ref 80.0–100.0)
MONO ABS: 0.3 10*3/uL (ref 0.2–0.9)
Monocytes Relative: 6 %
Neutro Abs: 4.3 10*3/uL (ref 1.4–6.5)
Neutrophils Relative %: 80 %
PLATELETS: 217 10*3/uL (ref 150–440)
RBC: 4.4 MIL/uL (ref 3.80–5.20)
RDW: 14.3 % (ref 11.5–14.5)
WBC: 5.3 10*3/uL (ref 3.6–11.0)

## 2015-03-14 LAB — CREATININE, SERUM
CREATININE: 1 mg/dL (ref 0.44–1.00)
GFR calc non Af Amer: 52 mL/min — ABNORMAL LOW (ref >60)

## 2015-03-14 MED ORDER — PREDNISONE 10 MG (21) PO TBPK: 10.0000 mg | ORAL_TABLET | Freq: Every day | ORAL | Status: DC

## 2015-03-14 MED ORDER — DOXYCYCLINE HYCLATE 50 MG PO CAPS: 50.0000 mg | ORAL_CAPSULE | Freq: Two times a day (BID) | ORAL | Status: DC

## 2015-03-14 NOTE — Discharge Summary (Signed)
[name], is a 81 y.o. female  DOB Aug 27, 1932  MRN [id number].  Admission date:  03/11/2015  Admitting Physician  Fritzi Mandes, MD  Discharge Date:  03/14/2015   Primary MD  [name], MD  Recommendations for primary care physician for things to follow:   Follow-up Dr. [name] in 1 week.   Admission Diagnosis  Community acquired pneumonia [J18.9] Pulmonary alveolitis ([place]) [J67.9] Acute respiratory failure with hypoxemia ([place]) [J96.01]   Discharge Diagnosis  Community acquired pneumonia [J18.9] Pulmonary alveolitis ([place]) [J67.9] Acute respiratory failure with hypoxemia ([place]) [J96.01]    Active Problems:   COPD with hypoxia [facility])      Past Medical History  Diagnosis Date  . Allergy   . COPD (chronic obstructive pulmonary disease) ([place])   . Hypercholesteremia   . TB (pulmonary tuberculosis)     treated with INH therapy   . Pulmonary hypertension ([facility])     Right Ventricular systolic pressure at 60 mm Hg by echo on july of 2011; Right heart cardic catherization revealed pulmonary artery pressusre of 27/10 with a mean of; Ventricular pressure 29/10 with pulmonary capillary wedge at 9  . GERD (gastroesophageal reflux disease)   . Hypertension   . Asthma     Past Surgical History  Procedure Laterality Date  . Tonsillectomy  1964  . Cataract extraction Bilateral 2008  . Cholecystectomy         History of present illness and  Hospital Course:     Kindly see H&P for history of present illness and admission details, please review complete Labs, Consult reports and Test reports for all details in brief  HPI  from the history and physical done on the day of admission  81 year old female patient with emphysema not on home oxygen comes in because of shortness of breath unable to talk. Sentences,  hypoxia with oxygen saturation 87% on room air, admitted for COPD exacerbation.   Hospital Course    1 acute hypoxia to COPD exacerbation, uvulitis. CT of the chest did not show any PE, showed alveolitis. Symptoms improved with IV steroids, IV antibiotics, oxygen, nebulizers. Hypoxia resolved, saturations 96% on room air. Not qualify for home oxygen. Seen by pulmonary Dr. Stevenson Clinch  secondary to hypoxia, right lung nodule seen on the CAT scan of the chest. He recommended BiPAP Minimum 4 hours at night which we continued. Patient primary doctor is Dr. [name] advised patient to follow-up with Dr. [name] regarding right lung nodule workup. Discharge to home with prednisone taper over 2 weeks  #2 right lung nodule patient needs CT chest in 3 weeks follow-up with Dr. [name]. #3. Hypertension ;  BP elevated time secondary to respiratory difficulty during hospital stay. Norvasc was increased to 10 mg Hospitalization but is better now so I advised her to resume 5 mg of Norvasc.  Discharge Condition: Stable   Follow UP      Discharge Instructions  and  Discharge Medications        Medication List    STOP taking these medications        predniSONE 5 MG tablet  Commonly known as:  DELTASONE  Replaced by:  predniSONE 10 MG (21) Tbpk tablet      TAKE these medications        acetaminophen 500 MG tablet  Commonly known as:  TYLENOL  Take 1,000 mg by mouth every 6 (six) hours as needed for mild pain.     albuterol 108 (90 BASE) MCG/ACT inhaler  Commonly  known as:  PROVENTIL HFA;VENTOLIN HFA  Inhale 2 puffs into the lungs every 4 (four) hours as needed for wheezing or shortness of breath.     amLODipine 5 MG tablet  Commonly known as:  NORVASC  Take 1 tablet (5 mg total) by mouth daily.     aspirin EC 81 MG tablet  Take 81 mg by mouth daily.     azelastine 0.1 % nasal spray  Commonly known as:  ASTELIN  Place 1 spray into both nostrils 2 (two) times daily.     CALCIUM 600+D  600-400 MG-UNIT tablet  Generic drug:  Calcium Carbonate-Vitamin D  Take 1 tablet by mouth at bedtime.     cholecalciferol 1000 UNITS tablet  Commonly known as:  VITAMIN D  Take 1,000 Units by mouth at bedtime.     doxycycline 50 MG capsule  Commonly known as:  VIBRAMYCIN  Take 1 capsule (50 mg total) by mouth 2 (two) times daily.     ferrous sulfate 325 (65 FE) MG tablet  Take 325 mg by mouth at bedtime.     fexofenadine 180 MG tablet  Commonly known as:  ALLEGRA  Take 180 mg by mouth daily.     Fluticasone-Salmeterol 500-50 MCG/DOSE Aepb  Commonly known as:  ADVAIR DISKUS  Inhale 1 puff into the lungs 2 (two) times daily.     guaiFENesin 600 MG 12 hr tablet  Commonly known as:  MUCINEX  Take 600 mg by mouth 2 (two) times daily.     lansoprazole 30 MG capsule  Commonly known as:  PREVACID  Take 30 mg by mouth daily.     multivitamin with minerals Tabs tablet  Take 1 tablet by mouth daily.     predniSONE 10 MG (21) Tbpk tablet  Commonly known as:  STERAPRED UNI-PAK 21 TAB  Take 1 tablet (10 mg total) by mouth daily. 4 tab daily for 4 days 3 tab daily for 4 days 2 tab daily for 4 days 1 tab daily for 2 days     theophylline 400 MG 24 hr tablet  Commonly known as:  UNIPHYL  Take 400 mg by mouth daily.     tiotropium 18 MCG inhalation capsule  Commonly known as:  SPIRIVA  Place 18 mcg into inhaler and inhale daily.          Diet and Activity recommendation: See Discharge Instructions above   Consults obtained - pulmonary   Major procedures and Radiology Reports - PLEASE review detailed and final reports for all details, in brief -      Dg Chest 2 View  03/11/2015  CLINICAL DATA:  Cough for 5 days EXAM: CHEST - 2 VIEW COMPARISON:  03/06/2007 FINDINGS: Cardiac shadow is within normal limits. Prominent epicardial fat pad is again seen. Multiple calcified granulomas are again noted. No focal infiltrate or sizable effusion is seen. No bony abnormality is noted.  IMPRESSION: No active disease. Electronically Signed   By: Inez Catalina M.D.   On: 03/11/2015 13:19   Ct Angio Chest Pe W/cm &/or Wo Cm  03/11/2015  CLINICAL DATA:  Shortness of breath, productive cough with brown sputum. EXAM: CT ANGIOGRAPHY CHEST WITH CONTRAST TECHNIQUE: Multidetector CT imaging of the chest was performed using the standard protocol during bolus administration of intravenous contrast. Multiplanar CT image reconstructions and MIPs were obtained to evaluate the vascular anatomy. CONTRAST:  9mL OMNIPAQUE IOHEXOL 350 MG/ML SOLN COMPARISON:  Chest x-ray earlier today.  Chest CT 03/06/2007 FINDINGS: No filling  defects in the pulmonary arteries to suggest pulmonary emboli. Heart is borderline in size. Coronary artery calcifications in the left anterior descending coronary artery. No mediastinal, hilar, or axillary adenopathy. Chest wall soft tissues are unremarkable. Small hiatal hernia. Mild emphysema. Calcified granuloma in the left upper lobe. There are reticulonodular tree-in-bud densities within the left upper lobe and left lower lobe suggesting small airways disease/ alveolitis. 13 mm nodule in the right lower lobe on image 94. No pleural effusions. Imaging into the upper abdomen shows no acute findings. Review of the MIP images confirms the above findings. IMPRESSION: No evidence of pulmonary embolus. Tree-in-bud densities in the lingula and left lower lobe, likely small airways disease/alveolitis. 13 mm nodule in the right lower lobe, new since 2008. This could be further characterize with PET CT. Coronary artery disease. Electronically Signed   By: Rolm Baptise M.D.   On: 03/11/2015 14:48    Micro Results     Recent Results (from the past 240 hour(s))  Blood culture (routine x 2)     Status: None (Preliminary result)   Collection Time: 03/11/15  4:46 PM  Result Value Ref Range Status   Specimen Description BLOOD LEFT FATTY CASTS  Final   Special Requests Normal  Final   Culture  NO GROWTH 3 DAYS  Final   Report Status PENDING  Incomplete  Blood culture (routine x 2)     Status: None (Preliminary result)   Collection Time: 03/11/15  4:46 PM  Result Value Ref Range Status   Specimen Description BLOOD RIGHT ASSIST CONTROL  Final   Special Requests   Final    BOTTLES DRAWN AEROBIC AND ANAEROBIC [place] AERO 5CC ANA   Culture NO GROWTH 3 DAYS  Final   Report Status PENDING  Incomplete       Today   Subjective:   Anita Ferrell today has no headache,no chest abdominal pain,no new weakness tingling or numbness, feels much better wants to go home today.   Objective:   Blood pressure 138/70, pulse 70, temperature 97.9 F (36.6 C), temperature source Oral, resp. rate 18, height 5\' 3"  (1.6 m), weight 73.029 kg (161 lb), SpO2 96 %.   Intake/Output Summary (Last 24 hours) at 03/14/15 1313 Last data filed at 03/14/15 0900  Gross per 24 hour  Intake    360 ml  Output      0 ml  Net    360 ml    Exam Awake Alert, Oriented x 3, No new F.N deficits, Normal affect [place].AT,PERRAL Supple Neck,No JVD, No cervical lymphadenopathy appriciated.  Symmetrical Chest wall movement, Good air movement bilaterally, CTAB RRR,No Gallops,Rubs or new Murmurs, No Parasternal Heave +ve B.Sounds, Abd Soft, Non tender, No organomegaly appriciated, No rebound -guarding or rigidity. No Cyanosis, Clubbing or edema, No new Rash or bruise  Data Review   CBC w Diff: Lab Results  Component Value Date   WBC 5.3 03/14/2015   WBC 5.1 12/08/2011   HGB 11.7* 03/14/2015   HGB 12.1 12/08/2011   HCT 36.6 03/14/2015   HCT 38.3 12/08/2011   PLT 217 03/14/2015   PLT 192 12/08/2011   LYMPHOPCT 14 03/14/2015   LYMPHOPCT 26.9 12/08/2011   MONOPCT 6 03/14/2015   MONOPCT 6.1 12/08/2011   EOSPCT 0 03/14/2015   EOSPCT 2.9 12/08/2011   BASOPCT 0 03/14/2015   BASOPCT 0.7 12/08/2011    CMP: Lab Results  Component Value Date   NA 142 03/11/2015   NA 143 06/23/2011   K 3.7  03/11/2015   K 4.5  06/23/2011   CL 108 03/11/2015   CL 106 06/23/2011   CO2 25 03/11/2015   CO2 30 06/23/2011   BUN 16 03/11/2015   BUN 13 06/23/2011   CREATININE 1.00 03/14/2015   CREATININE 1.08 06/23/2011   PROT 6.7 03/11/2015   ALBUMIN 3.8 03/11/2015   BILITOT 0.4 03/11/2015   ALKPHOS 60 03/11/2015   AST 30 03/11/2015   ALT 22 03/11/2015  .   Total Time in preparing paper work, data evaluation and todays exam - 35 minutes  Anita Ferrell M.D on 03/14/2015 at 1:13 PM    Note: This dictation was prepared with Dragon dictation along with smaller phrase technology. Any transcriptional errors that result from this process are unintentional.

## 2015-03-14 NOTE — Progress Notes (Signed)
Pt walked 2 laps around nursing station without O2.  Pt's O2 stats stayed above 92% on exertion.  Pt will be weaned to RA then discharged today.  Clarise Cruz, RN

## 2015-03-14 NOTE — Plan of Care (Signed)
Problem: Health Behavior/Discharge Planning: Goal: Ability to manage health-related needs will improve Outcome: Progressing Plan of care, progress to goals. 1. Improvement in shortness of breath and cough. 2. Pt free from injury. 3. Pt slept most of the night. Dorna Bloom RN

## 2015-03-14 NOTE — Progress Notes (Signed)
Discussed discharge instruction with pt and her son.  All questions and concerns addressed.  IVs removed per policy.  RX given.  Pt transported home via car by her son.  Clarise Cruz, RN

## 2015-03-14 NOTE — Care Management Important Message (Signed)
Important Message  Patient Details  Name: Anita Ferrell MRN: [id number] Date of Birth: Feb 10, 1933   Medicare Important Message Given:  Yes    Shelbie Ammons, RN 03/14/2015, 8:43 AM

## 2015-03-16 LAB — CULTURE, BLOOD (ROUTINE X 2)
CULTURE: NO GROWTH
CULTURE: NO GROWTH
SPECIAL REQUESTS: NORMAL

## 2015-05-09 ENCOUNTER — Encounter: Payer: Self-pay | Admitting: Internal Medicine

## 2015-05-09 ENCOUNTER — Ambulatory Visit (INDEPENDENT_AMBULATORY_CARE_PROVIDER_SITE_OTHER): Payer: Medicare Other | Admitting: Internal Medicine

## 2015-05-09 VITALS — BP 136/80 | HR 107 | Temp 97.8°F | Resp 18 | Ht 63.5 in | Wt 164.4 lb

## 2015-05-09 DIAGNOSIS — Z1239 Encounter for other screening for malignant neoplasm of breast: Secondary | ICD-10-CM | POA: Diagnosis not present

## 2015-05-09 DIAGNOSIS — R739 Hyperglycemia, unspecified: Secondary | ICD-10-CM | POA: Diagnosis not present

## 2015-05-09 DIAGNOSIS — I10 Essential (primary) hypertension: Secondary | ICD-10-CM

## 2015-05-09 DIAGNOSIS — K2100 Gastro-esophageal reflux disease with esophagitis, without bleeding: Secondary | ICD-10-CM

## 2015-05-09 DIAGNOSIS — J449 Chronic obstructive pulmonary disease, unspecified: Secondary | ICD-10-CM

## 2015-05-09 DIAGNOSIS — D649 Anemia, unspecified: Secondary | ICD-10-CM | POA: Diagnosis not present

## 2015-05-09 DIAGNOSIS — R0902 Hypoxemia: Secondary | ICD-10-CM

## 2015-05-09 DIAGNOSIS — E669 Obesity, unspecified: Secondary | ICD-10-CM

## 2015-05-09 DIAGNOSIS — R0602 Shortness of breath: Secondary | ICD-10-CM

## 2015-05-09 DIAGNOSIS — E7800 Pure hypercholesterolemia, unspecified: Secondary | ICD-10-CM

## 2015-05-09 DIAGNOSIS — K649 Unspecified hemorrhoids: Secondary | ICD-10-CM

## 2015-05-09 LAB — CBC WITH DIFFERENTIAL/PLATELET
BASOS PCT: 0.6 % (ref 0.0–3.0)
Basophils Absolute: 0 10*3/uL (ref 0.0–0.1)
EOS PCT: 2.7 % (ref 0.0–5.0)
Eosinophils Absolute: 0.2 10*3/uL (ref 0.0–0.7)
HCT: 39 % (ref 36.0–46.0)
Hemoglobin: 12.4 g/dL (ref 12.0–15.0)
LYMPHS ABS: 1 10*3/uL (ref 0.7–4.0)
Lymphocytes Relative: 15.4 % (ref 12.0–46.0)
MCHC: 31.9 g/dL (ref 30.0–36.0)
MCV: 85.1 fl (ref 78.0–100.0)
MONO ABS: 0.3 10*3/uL (ref 0.1–1.0)
Monocytes Relative: 4.3 % (ref 3.0–12.0)
NEUTROS ABS: 4.8 10*3/uL (ref 1.4–7.7)
NEUTROS PCT: 77 % (ref 43.0–77.0)
PLATELETS: 214 10*3/uL (ref 150.0–400.0)
RBC: 4.58 Mil/uL (ref 3.87–5.11)
RDW: 16 % — AB (ref 11.5–15.5)
WBC: 6.2 10*3/uL (ref 4.0–10.5)

## 2015-05-09 LAB — BASIC METABOLIC PANEL
BUN: 13 mg/dL (ref 6–23)
CALCIUM: 9.6 mg/dL (ref 8.4–10.5)
CO2: 29 mEq/L (ref 19–32)
CREATININE: 1.12 mg/dL (ref 0.40–1.20)
Chloride: 105 mEq/L (ref 96–112)
GFR: 49.62 mL/min — ABNORMAL LOW (ref >60.00)
Glucose, Bld: 112 mg/dL — ABNORMAL HIGH (ref 70–99)
Potassium: 4 mEq/L (ref 3.5–5.1)
Sodium: 143 mEq/L (ref 135–145)

## 2015-05-09 LAB — HEPATIC FUNCTION PANEL
ALBUMIN: 4 g/dL (ref 3.5–5.2)
ALK PHOS: 67 U/L (ref 39–117)
ALT: 15 U/L (ref 0–35)
AST: 22 U/L (ref 0–37)
BILIRUBIN DIRECT: 0.1 mg/dL (ref 0.0–0.3)
BILIRUBIN TOTAL: 0.5 mg/dL (ref 0.2–1.2)
Total Protein: 6.7 g/dL (ref 6.0–8.3)

## 2015-05-09 LAB — LIPID PANEL
Cholesterol: 205 mg/dL — ABNORMAL HIGH (ref 0–200)
HDL: 59 mg/dL (ref >39.00)
LDL Cholesterol: 119 mg/dL — ABNORMAL HIGH (ref 0–99)
NONHDL: 146.3
Total CHOL/HDL Ratio: 3
Triglycerides: 137 mg/dL (ref 0.0–149.0)
VLDL: 27.4 mg/dL (ref 0.0–40.0)

## 2015-05-09 LAB — HEMOGLOBIN A1C: HEMOGLOBIN A1C: 5.8 % (ref 4.6–6.5)

## 2015-05-09 LAB — TSH: TSH: 0.63 u[IU]/mL (ref 0.35–4.50)

## 2015-05-09 MED ORDER — AMLODIPINE BESYLATE 5 MG PO TABS: 5.0000 mg | ORAL_TABLET | Freq: Every day | ORAL | Status: DC

## 2015-05-09 NOTE — Progress Notes (Signed)
Pre-visit discussion using our clinic review tool. No additional management support is needed unless otherwise documented below in the visit note.  

## 2015-05-09 NOTE — Progress Notes (Signed)
Patient ID: [name], female   DOB: Aug 16, 1932, 82 y.o.   MRN: [id number]   Subjective:    Patient ID: [name], female    DOB: 1932/08/10, 82 y.o.   MRN: [id number]  HPI  Patient with past history of COPD, hypercholesterolemia, GERD and hypertension.  She comes in today to follow up on these issues.  She was admitted 03/11/15-03/14/15 with CAP, pulmonary alveolitis and acute respiratory failure with hypoxemia.  See hospital note an discharge summary for details.  Was seen by pulmonary (Dr Stevenson Clinch) seconday to the hypoxia and right lung nodule noted on CT scan.  He recommended BiPAP for four hours per night.  Did this while in the hospital.  Was given IV steroids and abx.  Felt better on discharge.  States breathing was better than had been in a long time.  Now with some sob.  No increased cough or congestion.  Was previously seeing Dr [name].  Has not followed up with him since out of hospital.  Desires to f/u with [place] [facility].  Needs f/u for her underlying lung issued and for the pulmonary nodule.  While in the hospital, blood pressure elevated.  Amlodipine was increased to '10mg'$  q day.  She has been on '5mg'$  q day since discharge.  She is eating.  No nausea or vomiting.  Bowels stable.     Past Medical History  Diagnosis Date  . Allergy   . COPD (chronic obstructive pulmonary disease) ([place])   . Hypercholesteremia   . TB (pulmonary tuberculosis)     treated with INH therapy   . Pulmonary hypertension (HCC)     Right Ventricular systolic pressure at 60 mm Hg by echo on july of 2011; Right heart cardic catherization revealed pulmonary artery pressusre of 27/10 with a mean of; Ventricular pressure 29/10 with pulmonary capillary wedge at 9  . GERD (gastroesophageal reflux disease)   . Hypertension   . Asthma    Past Surgical History  Procedure Laterality Date  . Tonsillectomy  1964  . Cataract extraction Bilateral 2008  . Cholecystectomy     Family History  Problem  Relation Age of Onset  . Heart disease Mother   . Heart disease Father   . Hypertension Sister   . Heart disease Brother    Social History   Social History  . Marital Status: Widowed    Spouse Name: N/A  . Number of Children: 3  . Years of Education: N/A   Social History Main Topics  . Smoking status: Former Research scientist (life sciences)  . Smokeless tobacco: Never Used     Comment: Quit in 1992  . Alcohol Use: No  . Drug Use: No  . Sexual Activity: No   Other Topics Concern  . None   Social History Narrative    Outpatient Encounter Prescriptions as of 05/09/2015  Medication Sig  . acetaminophen (TYLENOL) 500 MG tablet Take 1,000 mg by mouth every 6 (six) hours as needed for mild pain.  Marland Kitchen albuterol (PROVENTIL HFA;VENTOLIN HFA) 108 (90 BASE) MCG/ACT inhaler Inhale 2 puffs into the lungs every 4 (four) hours as needed for wheezing or shortness of breath.   Marland Kitchen amLODipine (NORVASC) 5 MG tablet Take 1 tablet (5 mg total) by mouth daily.  Marland Kitchen aspirin EC 81 MG tablet Take 81 mg by mouth daily.   Marland Kitchen azelastine (ASTELIN) 0.1 % nasal spray Place 1 spray into both nostrils 2 (two) times daily.  . Calcium Carbonate-Vitamin D (CALCIUM 600+D) 600-400 MG-UNIT tablet Take  1 tablet by mouth at bedtime.  . cholecalciferol (VITAMIN D) 1000 UNITS tablet Take 1,000 Units by mouth at bedtime.  . ferrous sulfate 325 (65 FE) MG tablet Take 325 mg by mouth at bedtime.  . fexofenadine (ALLEGRA) 180 MG tablet Take 180 mg by mouth daily.   . Fluticasone-Salmeterol (ADVAIR DISKUS) 500-50 MCG/DOSE AEPB Inhale 1 puff into the lungs 2 (two) times daily.  Marland Kitchen guaiFENesin (MUCINEX) 600 MG 12 hr tablet Take 600 mg by mouth 2 (two) times daily.   . lansoprazole (PREVACID) 30 MG capsule Take 30 mg by mouth daily.   . Multiple Vitamin (MULTIVITAMIN WITH MINERALS) TABS tablet Take 1 tablet by mouth daily.  . predniSONE (DELTASONE) 5 MG tablet Take by mouth.  . theophylline (UNIPHYL) 400 MG 24 hr tablet Take 400 mg by mouth daily.  Marland Kitchen  tiotropium (SPIRIVA) 18 MCG inhalation capsule Place 18 mcg into inhaler and inhale daily.  . [DISCONTINUED] amLODipine (NORVASC) 5 MG tablet Take 1 tablet (5 mg total) by mouth daily.  . [DISCONTINUED] doxycycline (VIBRAMYCIN) 50 MG capsule Take 1 capsule (50 mg total) by mouth 2 (two) times daily.  . [DISCONTINUED] predniSONE (STERAPRED UNI-PAK 21 TAB) 10 MG (21) TBPK tablet Take 1 tablet (10 mg total) by mouth daily. 4 tab daily for 4 days 3 tab daily for 4 days 2 tab daily for 4 days 1 tab daily for 2 days   No facility-administered encounter medications on file as of 05/09/2015.    Review of Systems  Constitutional: Negative for appetite change and unexpected weight change.  HENT: Negative for congestion and sinus pressure.   Eyes: Negative for discharge and redness.  Respiratory: Positive for shortness of breath. Negative for cough and chest tightness.   Cardiovascular: Negative for chest pain, palpitations and leg swelling.  Gastrointestinal: Negative for nausea, vomiting, abdominal pain and diarrhea.  Genitourinary: Negative for dysuria and difficulty urinating.  Musculoskeletal: Negative for back pain and joint swelling.  Skin: Negative for color change and rash.  Neurological: Negative for dizziness, light-headedness and headaches.  Psychiatric/Behavioral: Negative for dysphoric mood and agitation.       Objective:    Physical Exam  Constitutional: She appears well-developed and well-nourished. No distress.  HENT:  Nose: Nose normal.  Mouth/Throat: Oropharynx is clear and moist.  Eyes: Conjunctivae are normal. Right eye exhibits no discharge. Left eye exhibits no discharge.  Neck: Neck supple. No thyromegaly present.  Cardiovascular: Normal rate and regular rhythm.   Pulmonary/Chest: Breath sounds normal. No respiratory distress. She has no wheezes.  Abdominal: Soft. Bowel sounds are normal. There is no tenderness.  Musculoskeletal: She exhibits no edema or tenderness.    Lymphadenopathy:    She has no cervical adenopathy.  Skin: No rash noted. No erythema.  Psychiatric: She has a normal mood and affect. Her behavior is normal.    BP 136/80 mmHg  Pulse 107  Temp(Src) 97.8 F (36.6 C) (Oral)  Resp 18  Ht 5' 3.5" (1.613 m)  Wt 164 lb 6 oz (74.56 kg)  BMI 28.66 kg/m2  SpO2 95% Wt Readings from Last 3 Encounters:  05/09/15 164 lb 6 oz (74.56 kg)  03/11/15 161 lb (73.029 kg)  03/11/15 160 lb (72.576 kg)     Lab Results  Component Value Date   WBC 6.2 05/09/2015   HGB 12.4 05/09/2015   HCT 39.0 05/09/2015   PLT 214.0 05/09/2015   GLUCOSE 112* 05/09/2015   CHOL 205* 05/09/2015   TRIG 137.0 05/09/2015  HDL 59.00 05/09/2015   LDLDIRECT 127.1 08/25/2012   LDLCALC 119* 05/09/2015   ALT 15 05/09/2015   AST 22 05/09/2015   NA 143 05/09/2015   K 4.0 05/09/2015   CL 105 05/09/2015   CREATININE 1.12 05/09/2015   BUN 13 05/09/2015   CO2 29 05/09/2015   TSH 0.63 05/09/2015   HGBA1C 5.8 05/09/2015    Dg Chest 2 View  03/11/2015  CLINICAL DATA:  Cough for 5 days EXAM: CHEST - 2 VIEW COMPARISON:  03/06/2007 FINDINGS: Cardiac shadow is within normal limits. Prominent epicardial fat pad is again seen. Multiple calcified granulomas are again noted. No focal infiltrate or sizable effusion is seen. No bony abnormality is noted. IMPRESSION: No active disease. Electronically Signed   By: Inez Catalina M.D.   On: 03/11/2015 13:19   Ct Angio Chest Pe W/cm &/or Wo Cm  03/11/2015  CLINICAL DATA:  Shortness of breath, productive cough with brown sputum. EXAM: CT ANGIOGRAPHY CHEST WITH CONTRAST TECHNIQUE: Multidetector CT imaging of the chest was performed using the standard protocol during bolus administration of intravenous contrast. Multiplanar CT image reconstructions and MIPs were obtained to evaluate the vascular anatomy. CONTRAST:  45m OMNIPAQUE IOHEXOL 350 MG/ML SOLN COMPARISON:  Chest x-ray earlier today.  Chest CT 03/06/2007 FINDINGS: No filling defects  in the pulmonary arteries to suggest pulmonary emboli. Heart is borderline in size. Coronary artery calcifications in the left anterior descending coronary artery. No mediastinal, hilar, or axillary adenopathy. Chest wall soft tissues are unremarkable. Small hiatal hernia. Mild emphysema. Calcified granuloma in the left upper lobe. There are reticulonodular tree-in-bud densities within the left upper lobe and left lower lobe suggesting small airways disease/ alveolitis. 13 mm nodule in the right lower lobe on image 94. No pleural effusions. Imaging into the upper abdomen shows no acute findings. Review of the MIP images confirms the above findings. IMPRESSION: No evidence of pulmonary embolus. Tree-in-bud densities in the lingula and left lower lobe, likely small airways disease/alveolitis. 13 mm nodule in the right lower lobe, new since 2008. This could be further characterize with PET CT. Coronary artery disease. Electronically Signed   By: KRolm BaptiseM.D.   On: 03/11/2015 14:48       Assessment & Plan:   Problem List Items Addressed This Visit    Anemia    Has had extensive w/up.  Evaluated by Dr PMa Hillock  Follow cbc.       Relevant Orders   CBC with Differential/Platelet (Completed)   COPD with hypoxia ([place]    Found to be hypoxic in hospital.  Did not qualify for home O2.  Saw pulmonary during her hospital stay.  See notes.  Recommended BiPap for 4 hours per night.  Breathing is better.  Still some sob.  Will refer back to pulmonary for f/u.  Has not seen Dr FRaul Delsince her discharge.  Wants to f/u with [facility].        Relevant Medications   predniSONE (DELTASONE) 5 MG tablet   Essential hypertension    Blood pressure elevated in hospital.  Better now.  Continue current medication regimen.        Relevant Medications   amLODipine (NORVASC) 5 MG tablet   Other Relevant Orders   TSH (Completed)   Basic metabolic panel (Completed)   GERD (gastroesophageal reflux disease)     Controlled.        Hemorrhoids    S/p rubber band ligation.  Still some occasional issues.  Is planning  to f/u with Dr Tamala Julian.        Relevant Medications   amLODipine (NORVASC) 5 MG tablet   Hypercholesterolemia    Low cholesterol diet and exercise.  Follow lipid panel.   Lab Results  Component Value Date   CHOL 205* 05/09/2015   HDL 59.00 05/09/2015   LDLCALC 119* 05/09/2015   LDLDIRECT 127.1 08/25/2012   TRIG 137.0 05/09/2015   CHOLHDL 3 05/09/2015        Relevant Medications   amLODipine (NORVASC) 5 MG tablet   Other Relevant Orders   Lipid panel (Completed)   Hepatic function panel (Completed)   Hyperglycemia    Low carb diet and exercise.  Follow met b and a1c.       Relevant Orders   Hemoglobin A1c (Completed)   Obesity (BMI 30-39.9)    Diet and exercise.       Shortness of breath    Recently admitted with sob and hypoxia as outlined.  Diagnosed with CAP.  CT reviewed.  Lung nodule.  Still with some sob.  Schedule f/u with pulmonary as outlined.  Will need f/u regarding her lung nodule.         Other Visit Diagnoses    Screening breast examination    -  Primary    Relevant Orders    MM DIGITAL SCREENING BILATERAL        [name], MD

## 2015-05-10 ENCOUNTER — Encounter: Payer: Self-pay | Admitting: Internal Medicine

## 2015-05-11 ENCOUNTER — Encounter: Payer: Self-pay | Admitting: Internal Medicine

## 2015-05-11 NOTE — Assessment & Plan Note (Signed)
Low cholesterol diet and exercise.  Follow lipid panel.   Lab Results  Component Value Date   CHOL 205* 05/09/2015   HDL 59.00 05/09/2015   LDLCALC 119* 05/09/2015   LDLDIRECT 127.1 08/25/2012   TRIG 137.0 05/09/2015   CHOLHDL 3 05/09/2015

## 2015-05-11 NOTE — Assessment & Plan Note (Signed)
Found to be hypoxic in hospital.  Did not qualify for home O2.  Saw pulmonary during her hospital stay.  See notes.  Recommended BiPap for 4 hours per night.  Breathing is better.  Still some sob.  Will refer back to pulmonary for f/u.  Has not seen Dr [name] since her discharge.  Wants to f/u with [facility].

## 2015-05-11 NOTE — Assessment & Plan Note (Signed)
S/p rubber band ligation.  Still some occasional issues.  Is planning to f/u with Dr Tamala Julian.

## 2015-05-11 NOTE — Assessment & Plan Note (Signed)
Controlled.  

## 2015-05-11 NOTE — Assessment & Plan Note (Signed)
Diet and exercise.   

## 2015-05-11 NOTE — Assessment & Plan Note (Signed)
Blood pressure elevated in hospital.  Better now.  Continue current medication regimen.

## 2015-05-11 NOTE — Assessment & Plan Note (Signed)
Has had extensive w/up.  Evaluated by Dr Ma Hillock.  Follow cbc.

## 2015-05-11 NOTE — Assessment & Plan Note (Signed)
Recently admitted with sob and hypoxia as outlined.  Diagnosed with CAP.  CT reviewed.  Lung nodule.  Still with some sob.  Schedule f/u with pulmonary as outlined.  Will need f/u regarding her lung nodule.

## 2015-05-11 NOTE — Assessment & Plan Note (Signed)
Low carb diet and exercise.  Follow met b and a1c.  

## 2015-05-19 ENCOUNTER — Encounter: Payer: Self-pay | Admitting: Internal Medicine

## 2015-05-19 DIAGNOSIS — J449 Chronic obstructive pulmonary disease, unspecified: Secondary | ICD-10-CM

## 2015-05-19 DIAGNOSIS — R0602 Shortness of breath: Secondary | ICD-10-CM

## 2015-05-20 NOTE — Telephone Encounter (Signed)
There is no recent referral placed for pulmonary. Pt states no one called to setup an appointment with her. Please advise

## 2015-05-20 NOTE — Telephone Encounter (Signed)
Order placed for pulmonary referral.  

## 2015-06-19 ENCOUNTER — Other Ambulatory Visit: Payer: Self-pay | Admitting: Internal Medicine

## 2015-06-19 ENCOUNTER — Ambulatory Visit
Admission: RE | Admit: 2015-06-19 | Discharge: 2015-06-19 | Disposition: A | Discharge status: Home / Self Care | Payer: Medicare Other | Source: Ambulatory Visit | Attending: Internal Medicine | Admitting: Internal Medicine

## 2015-06-19 DIAGNOSIS — Z1239 Encounter for other screening for malignant neoplasm of breast: Secondary | ICD-10-CM

## 2015-06-19 DIAGNOSIS — Z1231 Encounter for screening mammogram for malignant neoplasm of breast: Secondary | ICD-10-CM | POA: Insufficient documentation

## 2015-06-20 ENCOUNTER — Encounter: Payer: Self-pay | Admitting: Pulmonary Disease

## 2015-06-20 ENCOUNTER — Other Ambulatory Visit: Payer: Self-pay | Admitting: Pulmonary Disease

## 2015-06-20 ENCOUNTER — Ambulatory Visit (INDEPENDENT_AMBULATORY_CARE_PROVIDER_SITE_OTHER): Payer: Medicare Other | Admitting: Pulmonary Disease

## 2015-06-20 VITALS — BP 148/82 | HR 122 | Ht 63.5 in | Wt 163.4 lb

## 2015-06-20 DIAGNOSIS — R911 Solitary pulmonary nodule: Secondary | ICD-10-CM

## 2015-06-20 DIAGNOSIS — J42 Unspecified chronic bronchitis: Secondary | ICD-10-CM

## 2015-06-20 DIAGNOSIS — J438 Other emphysema: Secondary | ICD-10-CM | POA: Diagnosis not present

## 2015-06-20 MED ORDER — MOMETASONE FURO-FORMOTEROL FUM 100-5 MCG/ACT IN AERO: 2.0000 | INHALATION_SPRAY | Freq: Every day | RESPIRATORY_TRACT | Status: DC

## 2015-06-20 MED ORDER — MOMETASONE FURO-FORMOTEROL FUM 100-5 MCG/ACT IN AERO: 2.0000 | INHALATION_SPRAY | Freq: Two times a day (BID) | RESPIRATORY_TRACT | Status: DC

## 2015-06-20 NOTE — Progress Notes (Signed)
PULMONARY HOSPITAL FOLLOW UP NOTE  Requesting MD/Service: Self Date of initial consultation: 06/20/15 Reason for consultation: COPD, lung nodule  HPI:  6 F with history of COPD previously followed by Dr [name] (last seen by him in Aug 2016) and seen by Dr Stevenson Clinch in consultation in hospital in Nov 2016 when she was admitted with AECOPD. At that time, a CTA chest was performed revealing a new RLL nodule. Presently, she is at her baseline class III dyspnea. She has intermittent cough which is predominantly nonproductive. She has a documented history of TB which, by her description, was latent TB treated with INH for 9 months. Denies CP, fever, purulent sputum, hemoptysis, LE edema and calf tenderness.   She notes that she was previously on [facility] and was discharged in Nov on Advair. She indicates that she prefers the [facility]   Past Medical History  Diagnosis Date  . Allergy   . COPD (chronic obstructive pulmonary disease) ([place])   . Hypercholesteremia   . TB (pulmonary tuberculosis)     treated with INH therapy   . Pulmonary hypertension (HCC)     Right Ventricular systolic pressure at 60 mm Hg by echo on july of 2011; Right heart cardic catherization revealed pulmonary artery pressusre of 27/10 with a mean of; Ventricular pressure 29/10 with pulmonary capillary wedge at 9  . GERD (gastroesophageal reflux disease)   . Hypertension   . Asthma     Past Surgical History  Procedure Laterality Date  . Tonsillectomy  1964  . Cataract extraction Bilateral 2008  . Cholecystectomy      MEDICATIONS: I have reviewed all medications and confirmed regimen as documented  Social History   Social History  . Marital Status: Widowed    Spouse Name: N/A  . Number of Children: 3  . Years of Education: N/A   Occupational History  . Not on file.   Social History Main Topics  . Smoking status: Former Research scientist (life sciences)  . Smokeless tobacco: Never Used     Comment: Quit in 1992  . Alcohol Use: No  . Drug  Use: No  . Sexual Activity: No   Other Topics Concern  . Not on file   Social History Narrative    Family History  Problem Relation Age of Onset  . Heart disease Mother   . Heart disease Father   . Hypertension Sister   . Heart disease Brother   . Breast cancer Maternal Aunt     ROS: No fever, myalgias/arthralgias, unexplained weight loss or weight gain No new focal weakness or sensory deficits No otalgia, hearing loss, visual changes, nasal and sinus symptoms, mouth and throat problems No neck pain or adenopathy No abdominal pain, N/V/D, diarrhea, change in bowel pattern No dysuria, change in urinary pattern No LE edema or calf tenderness   Filed Vitals:   06/20/15 1057  BP: 148/82  Pulse: 122  Height: 5' 3.5" (1.613 m)  Weight: 163 lb 6.4 oz (74.118 kg)  SpO2: 92%     EXAM:  Gen: WDWN, No overt distress HEENT: NCAT, oropharynx normal Neck: Supple without LAN, thyromegaly, JVD Lungs: breath sounds moderately diminished, percussion note normal, No wheezes Cardiovascular: Normal rate, reg rhythm, no murmurs noted Abdomen: Soft, nontender, normal BS Ext: without clubbing, cyanosis, edema Neuro: CNs grossly intact, motor and sensory intact, DTRs symmetric Skin: Limited exam, no lesions noted  DATA:   BMP Latest Ref Rng 05/09/2015 03/14/2015 03/11/2015  Glucose 70 - 99 mg/dL 112(H) - 127(H)  BUN 6 -  23 mg/dL 13 - 16  Creatinine 0.40 - 1.20 mg/dL 1.12 1.00 1.09(H)  Sodium 135 - 145 mEq/L 143 - 142  Potassium 3.5 - 5.1 mEq/L 4.0 - 3.7  Chloride 96 - 112 mEq/L 105 - 108  CO2 19 - 32 mEq/L 29 - 25  Calcium 8.4 - 10.5 mg/dL 9.6 - 9.3    CBC Latest Ref Rng 05/09/2015 03/14/2015 03/11/2015  WBC 4.0 - 10.5 K/uL 6.2 5.3 6.5  Hemoglobin 12.0 - 15.0 g/dL 12.4 11.7(L) 12.3  Hematocrit 36.0 - 46.0 % 39.0 36.6 38.2  Platelets 150.0 - 400.0 K/uL 214.0 217 201    Spirometry 2015 [facility]) Ref Pre Pre % Post Post%  FVC 2.66 liters 109% pred FEV1 1.16 liters  65% pred FEV1/FVC % 43%  CXR (03/11/15): Multiple calcified granulomas are again noted. No focal infiltrate or sizable effusion is seen. No active disease.    CT chest (03/11/15): No evidence of pulmonary embolus. Tree-in-bud densities in the lingula and left lower lobe, likely small airways disease/alveolitis. 13 mm nodule in the right lower lobe, new since 2008  IMPRESSION:     ICD-9-CM ICD-10-CM   1. Other emphysema ([place]) 492.8 J43.8 Pulmonary function test  2. Chronic bronchitis, unspecified chronic bronchitis type ([place]) 491.9 J42 Pulmonary function test  3. Lung nodule 793.11 R91.1 CT Chest Wo Contrast     PLAN:  COPD/emphysema Stop theophylline (out of concern for limited benefit and risk of toxicity) Stop Advair Begin Dulera inhaler - 2 puffs twice a day Continue Spiriva Continue prednisone @ 5 mg daily - we will try to reduce this slowly over time  Lung nodule Repeat CT chest  Follow up in 6 weeks with PFTs    Wilhelmina Mcardle, MD [facility] [place] [facility], Critical Care Medicine

## 2015-06-20 NOTE — Patient Instructions (Addendum)
COPD/emphysema Stop Advair and theophylline (Uniphyll) Begin Dulera inhaler - 2 puffs twice a day Continue Spiriva Continue prednisone @ 5 mg daily - we will try to reduce this slowly over time When you return, we will get repeat lung function measurements  Lung nodule Repeat CT chest  Follow up in 6 weeks

## 2015-06-24 ENCOUNTER — Encounter: Payer: Self-pay | Admitting: Pulmonary Disease

## 2015-06-24 ENCOUNTER — Other Ambulatory Visit: Payer: Self-pay | Admitting: Pulmonary Disease

## 2015-07-08 ENCOUNTER — Ambulatory Visit (INDEPENDENT_AMBULATORY_CARE_PROVIDER_SITE_OTHER): Payer: Medicare Other | Admitting: Internal Medicine

## 2015-07-08 ENCOUNTER — Encounter: Payer: Self-pay | Admitting: Internal Medicine

## 2015-07-08 VITALS — BP 122/80 | HR 76 | Temp 97.6°F | Resp 18 | Ht 63.5 in | Wt 164.0 lb

## 2015-07-08 DIAGNOSIS — J449 Chronic obstructive pulmonary disease, unspecified: Secondary | ICD-10-CM | POA: Diagnosis not present

## 2015-07-08 DIAGNOSIS — E7800 Pure hypercholesterolemia, unspecified: Secondary | ICD-10-CM

## 2015-07-08 DIAGNOSIS — I10 Essential (primary) hypertension: Secondary | ICD-10-CM

## 2015-07-08 DIAGNOSIS — R739 Hyperglycemia, unspecified: Secondary | ICD-10-CM

## 2015-07-08 DIAGNOSIS — K2100 Gastro-esophageal reflux disease with esophagitis, without bleeding: Secondary | ICD-10-CM

## 2015-07-08 DIAGNOSIS — Z91048 Other nonmedicinal substance allergy status: Secondary | ICD-10-CM

## 2015-07-08 DIAGNOSIS — Z9109 Other allergy status, other than to drugs and biological substances: Secondary | ICD-10-CM

## 2015-07-08 DIAGNOSIS — R0902 Hypoxemia: Secondary | ICD-10-CM

## 2015-07-08 NOTE — Progress Notes (Signed)
Pre-visit discussion using our clinic review tool. No additional management support is needed unless otherwise documented below in the visit note.  

## 2015-07-08 NOTE — Progress Notes (Signed)
Patient ID: Anita Ferrell, female   DOB: 15-Oct-1932, 82 y.o.   MRN: [id number]   Subjective:    Patient ID: Anita Ferrell, female    DOB: July 27, 1932, 82 y.o.   MRN: [id number]  HPI  Patient here for a scheduled follow up.  She saw Dr Alva Garnet.  On Dulera and spiriva.  Taking prednisone 4m daily.  Off theophylline.  Breathing stable.  No chest pain or tightness.  No acid reflux reported.  No abdominal pain or cramping.  Bowels stable.  She has f/u planned 07/29/15 with f/u CT and PFTs and then f/u with pulmonary 08/04/15.     Past Medical History  Diagnosis Date  . Allergy   . [place]   . Hypercholesteremia   . TB (pulmonary tuberculosis)     treated with INH therapy   . Pulmonary hypertension (HCC)     Right Ventricular systolic pressure at 60 mm Hg by echo on july of 2011; Right heart cardic catherization revealed pulmonary artery pressusre of 27/10 with a mean of; Ventricular pressure 29/10 with pulmonary capillary wedge at 9  . GERD (gastroesophageal reflux disease)   . Hypertension   . Asthma    Past Surgical History  Procedure Laterality Date  . Tonsillectomy  1964  . Cataract extraction Bilateral 2008  . Cholecystectomy     Family History  Problem Relation Age of Onset  . Heart disease Mother   . Heart disease Father   . Hypertension Sister   . Heart disease Brother   . Breast cancer Maternal Aunt    Social History   Social History  . Marital Status: Widowed    Spouse Name: N/A  . Number of Children: 3  . Years of Education: N/A   Social History Main Topics  . Smoking status: Former SResearch scientist (life sciences) . Smokeless tobacco: Never Used     Comment: Quit in 1992  . Alcohol Use: No  . Drug Use: No  . Sexual Activity: No   Other Topics Concern  . None   Social History Narrative    Outpatient Encounter Prescriptions as of 07/08/2015  Medication Sig  . acetaminophen (TYLENOL) 500 MG tablet Take 1,000 mg by mouth every 6  (six) hours as needed for mild pain.  .Marland KitchenADVAIR DISKUS 500-50 MCG/DOSE AEPB   . albuterol (PROVENTIL HFA;VENTOLIN HFA) 108 (90 BASE) MCG/ACT inhaler Inhale 2 puffs into the lungs every 4 (four) hours as needed for wheezing or shortness of breath.   .Marland KitchenamLODipine (NORVASC) 5 MG tablet Take 1 tablet (5 mg total) by mouth daily.  .Marland Kitchenaspirin EC 81 MG tablet Take 81 mg by mouth daily.   .Marland Kitchenazelastine (ASTELIN) 0.1 % nasal spray Place 1 spray into both nostrils 2 (two) times daily.  . Calcium Carbonate-Vitamin D (CALCIUM 600+D) 600-400 MG-UNIT tablet Take 1 tablet by mouth at bedtime.  . cholecalciferol (VITAMIN D) 1000 UNITS tablet Take 1,000 Units by mouth at bedtime.  . ferrous sulfate 325 (65 FE) MG tablet Take 325 mg by mouth at bedtime.  . fexofenadine (ALLEGRA) 180 MG tablet Take 180 mg by mouth daily.   .Marland KitchenguaiFENesin (MUCINEX) 600 MG 12 hr tablet Take 600 mg by mouth 2 (two) times daily.   . lansoprazole (PREVACID) 30 MG capsule Take 30 mg by mouth daily.   . mometasone-formoterol (DULERA) 100-5 MCG/ACT AERO Inhale 2 puffs into the lungs 2 (two) times daily.  . Multiple Vitamin (MULTIVITAMIN WITH MINERALS) TABS tablet  Take 1 tablet by mouth daily.  . predniSONE (DELTASONE) 5 MG tablet TAKE ONE (1) TABLET BY MOUTH EVERY DAY  . SPIRIVA HANDIHALER 18 MCG inhalation capsule INHALE CONTENTS OF ONE CAPSULE ONCE A DAY AS DIRECTED  . [DISCONTINUED] mometasone-formoterol (DULERA) 100-5 MCG/ACT AERO Inhale 2 puffs into the lungs daily.   No facility-administered encounter medications on file as of 07/08/2015.    Review of Systems  Constitutional: Negative for appetite change and unexpected weight change.  HENT: Negative for congestion and sinus pressure.   Respiratory: Negative for cough and chest tightness.        No increased sob.  Breathing stable.    Cardiovascular: Negative for chest pain, palpitations and leg swelling.  Gastrointestinal: Negative for nausea, vomiting, abdominal pain and diarrhea.   Genitourinary: Negative for dysuria and difficulty urinating.  Musculoskeletal: Negative for back pain and joint swelling.  Skin: Negative for color change and rash.  Neurological: Negative for dizziness, light-headedness and headaches.  Psychiatric/Behavioral: Negative for dysphoric mood and agitation.       Objective:    Physical Exam  Constitutional: She appears well-developed and well-nourished. No distress.  HENT:  Nose: Nose normal.  Mouth/Throat: Oropharynx is clear and moist.  Neck: Neck supple. No thyromegaly present.  Cardiovascular: Normal rate and regular rhythm.   Pulmonary/Chest: Breath sounds normal. No respiratory distress. She has no wheezes.  Abdominal: Soft. Bowel sounds are normal. There is no tenderness.  Musculoskeletal: She exhibits no edema or tenderness.  Lymphadenopathy:    She has no cervical adenopathy.  Skin: No rash noted. No erythema.  Psychiatric: She has a normal mood and affect. Her behavior is normal.    BP 122/80 mmHg  Pulse 76  Temp(Src) 97.6 F (36.4 C) (Oral)  Resp 18  Ht 5' 3.5" (1.613 m)  Wt 164 lb (74.39 kg)  BMI 28.59 kg/m2  SpO2 92% Wt Readings from Last 3 Encounters:  07/08/15 164 lb (74.39 kg)  06/20/15 163 lb 6.4 oz (74.118 kg)  05/09/15 164 lb 6 oz (74.56 kg)     Lab Results  Component Value Date   WBC 6.2 05/09/2015   HGB 12.4 05/09/2015   HCT 39.0 05/09/2015   PLT 214.0 05/09/2015   GLUCOSE 112* 05/09/2015   CHOL 205* 05/09/2015   TRIG 137.0 05/09/2015   HDL 59.00 05/09/2015   LDLDIRECT 127.1 08/25/2012   LDLCALC 119* 05/09/2015   ALT 15 05/09/2015   AST 22 05/09/2015   NA 143 05/09/2015   K 4.0 05/09/2015   CL 105 05/09/2015   CREATININE 1.12 05/09/2015   BUN 13 05/09/2015   CO2 29 05/09/2015   TSH 0.63 05/09/2015   HGBA1C 5.8 05/09/2015    Mm Digital Screening Bilateral  06/19/2015  CLINICAL DATA:  Screening. EXAM: DIGITAL SCREENING BILATERAL MAMMOGRAM WITH CAD COMPARISON:  Previous exam(s). ACR  Breast Density Category a: The breast tissue is almost entirely fatty. FINDINGS: There are no findings suspicious for malignancy. Images were processed with CAD. IMPRESSION: No mammographic evidence of malignancy. A result letter of this screening mammogram will be mailed directly to the patient. RECOMMENDATION: Screening mammogram in one year. (Code:SM-B-01Y) BI-RADS CATEGORY  1: Negative. Electronically Signed   By: Curlene Dolphin M.D.   On: 06/19/2015 13:10       Assessment & Plan:   Problem List Items Addressed This Visit    COPD with hypoxia [facility])    Seeing Dr Alva Garnet now.  Breathing better.  Continue Dulera and spiriva.  Follow.  Keep f/u appt.  Planning for f/u CT and PFTs.        Relevant Medications   ADVAIR DISKUS 500-50 MCG/DOSE AEPB   Environmental allergies    On allegra and uses astelin nasal spray.  Follow.        Essential hypertension - Primary    Blood pressure under good control.  Continue same medication regimen.  Follow pressures.  Follow metabolic panel.        GERD (gastroesophageal reflux disease)    Upper symptoms controlled.        Hypercholesterolemia    Low cholesterol diet and exercise.  Follow lipid panel.        Hyperglycemia    Low carb diet and exercise.  Follow met b and a1c.            [name], MD

## 2015-07-20 ENCOUNTER — Encounter: Payer: Self-pay | Admitting: Internal Medicine

## 2015-07-20 NOTE — Assessment & Plan Note (Signed)
Low carb diet and exercise.  Follow met b and a1c.   

## 2015-07-20 NOTE — Assessment & Plan Note (Signed)
Seeing Dr Alva Garnet now.  Breathing better.  Continue Dulera and spiriva.  Follow.  Keep f/u appt.  Planning for f/u CT and PFTs.

## 2015-07-20 NOTE — Assessment & Plan Note (Signed)
Low cholesterol diet and exercise.  Follow lipid panel.   

## 2015-07-20 NOTE — Assessment & Plan Note (Signed)
On allegra and uses astelin nasal spray.  Follow.

## 2015-07-20 NOTE — Assessment & Plan Note (Signed)
Blood pressure under good control.  Continue same medication regimen.  Follow pressures.  Follow metabolic panel.   

## 2015-07-20 NOTE — Assessment & Plan Note (Signed)
Upper symptoms controlled.  

## 2015-07-29 ENCOUNTER — Ambulatory Visit
Admission: RE | Admit: 2015-07-29 | Discharge: 2015-07-29 | Disposition: A | Discharge status: Home / Self Care | Payer: Medicare Other | Source: Ambulatory Visit | Attending: Pulmonary Disease | Admitting: Pulmonary Disease

## 2015-07-29 ENCOUNTER — Ambulatory Visit (INDEPENDENT_AMBULATORY_CARE_PROVIDER_SITE_OTHER): Payer: Medicare Other | Admitting: *Deleted

## 2015-07-29 DIAGNOSIS — J449 Chronic obstructive pulmonary disease, unspecified: Secondary | ICD-10-CM | POA: Diagnosis not present

## 2015-07-29 DIAGNOSIS — J42 Unspecified chronic bronchitis: Secondary | ICD-10-CM | POA: Diagnosis not present

## 2015-07-29 DIAGNOSIS — J438 Other emphysema: Secondary | ICD-10-CM

## 2015-07-29 DIAGNOSIS — R911 Solitary pulmonary nodule: Secondary | ICD-10-CM | POA: Diagnosis not present

## 2015-07-29 LAB — PULMONARY FUNCTION TEST
DL/VA % PRED: 55 %
DL/VA: 2.68 ml/min/mmHg/L
DLCO unc % pred: 60 %
DLCO unc: 14.73 ml/min/mmHg
FEF 25-75 POST: 0.43 L/s
FEF 25-75 Pre: 0.44 L/sec
FEF2575-%CHANGE-POST: -3 %
FEF2575-%PRED-POST: 31 %
FEF2575-%Pred-Pre: 32 %
FEV1-%CHANGE-POST: -1 %
FEV1-%PRED-PRE: 58 %
FEV1-%Pred-Post: 57 %
FEV1-POST: 1.09 L
FEV1-Pre: 1.11 L
FEV1FVC-%CHANGE-POST: 11 %
FEV1FVC-%Pred-Pre: 68 %
FEV6-%Change-Post: -10 %
FEV6-%PRED-PRE: 87 %
FEV6-%Pred-Post: 78 %
FEV6-POST: 1.89 L
FEV6-PRE: 2.11 L
FEV6FVC-%Change-Post: 2 %
FEV6FVC-%PRED-PRE: 102 %
FEV6FVC-%Pred-Post: 104 %
FVC-%CHANGE-POST: -11 %
FVC-%PRED-PRE: 85 %
FVC-%Pred-Post: 75 %
FVC-PRE: 2.19 L
FVC-Post: 1.94 L
POST FEV1/FVC RATIO: 56 %
PRE FEV1/FVC RATIO: 50 %
PRE FEV6/FVC RATIO: 96 %
Post FEV6/FVC ratio: 98 %
RV % PRED: 162 %
RV: 3.93 L
TLC % pred: 126 %
TLC: 6.37 L

## 2015-07-29 MED ORDER — AZELASTINE HCL 0.1 % NA SOLN: 1.0000 | Freq: Two times a day (BID) | NASAL | Status: DC

## 2015-07-29 MED ORDER — ALBUTEROL SULFATE HFA 108 (90 BASE) MCG/ACT IN AERS: 2.0000 | INHALATION_SPRAY | RESPIRATORY_TRACT | Status: DC | PRN

## 2015-07-29 NOTE — Progress Notes (Signed)
PFT performed today. 

## 2015-08-04 ENCOUNTER — Encounter: Payer: Self-pay | Admitting: Pulmonary Disease

## 2015-08-04 ENCOUNTER — Ambulatory Visit (INDEPENDENT_AMBULATORY_CARE_PROVIDER_SITE_OTHER): Payer: Medicare Other | Admitting: Pulmonary Disease

## 2015-08-04 VITALS — BP 126/72 | HR 83 | Ht 63.5 in | Wt 165.2 lb

## 2015-08-04 DIAGNOSIS — J449 Chronic obstructive pulmonary disease, unspecified: Secondary | ICD-10-CM

## 2015-08-04 DIAGNOSIS — R911 Solitary pulmonary nodule: Secondary | ICD-10-CM | POA: Diagnosis not present

## 2015-08-04 NOTE — Patient Instructions (Signed)
Continue Dulera and Spiriva  Take 5 mg prednisone on odd numbered days Do not take prednisone on even numbered days Do this for one month (through May 15th)  Follow up in 6 weeks

## 2015-08-06 ENCOUNTER — Telehealth: Payer: Self-pay | Admitting: *Deleted

## 2015-08-06 ENCOUNTER — Other Ambulatory Visit: Payer: Self-pay

## 2015-08-06 MED ORDER — MOMETASONE FURO-FORMOTEROL FUM 100-5 MCG/ACT IN AERO: 2.0000 | INHALATION_SPRAY | Freq: Two times a day (BID) | RESPIRATORY_TRACT | Status: DC

## 2015-08-06 NOTE — Telephone Encounter (Signed)
PA submitted for [facility] thru [facility]. Anita Ferrell Pt IDFN:[id number]  Submitted for review.

## 2015-08-07 NOTE — Progress Notes (Signed)
PULMONARY OFFICE FOLLOW UP NOTE  Requesting MD/Service: Self Date of initial consultation: 06/20/15 Reason for consultation: COPD, lung nodule  Initial office HPI (06/20/15):  50 F with history of COPD previously followed by Dr [name] (last seen by him in Aug 2016) and seen by Dr Stevenson Clinch in consultation in hospital in Nov 2016 when she was admitted with AECOPD. At that time, a CTA chest was performed revealing a new RLL nodule. Presently, she is at her baseline class III dyspnea. She has intermittent cough which is predominantly nonproductive. She has a documented history of TB which, by her description, was latent TB treated with INH for 9 months. Denies CP, fever, purulent sputum, hemoptysis, LE edema and calf tenderness. She notes that she was previously on [facility] and was discharged in Nov on Advair. She indicates that she prefers the [facility] Initial IMP/PLAN: COPD/emphysema, Lung nodule - incidental finding on CTA chest, H/O positive PPD - S/P 9 months INH Stop theophylline (out of concern for limited benefit and risk of toxicity), Change Advair to [facility] inhaler - 2 puffs twice a day, Continue Spiriva, Continue prednisone @ 5 mg daily - we will try to reduce this slowly over time, Repeat CT chest, Follow up in 6 weeks with PFTs  CT chest 07/29/15: 1. Significant decrease in size of the previously demonstrated right lower lobe nodule, compatible with a benign process. 2. No significant change in multiple additional sub cm nodules and calcified granulomata in both lungs, compatible with a benign process. 3. Mild changes of COPD and chronic bronchitis. 4. Resolved infection in the lingula and left lower lobe  PFTs 07/29/15: moderate obstruction, normal lung volumes, mild reduction DLCO  ROV 08/04/15: No new complaints. Here to review results of PFTs and CT chest. Continue Dulera and Spiriva, change Pred to 5 mg qod for one month, then stop. F/U 6 wks  Filed Vitals:   08/04/15 0918  BP: 126/72   Pulse: 83  Height: 5' 3.5" (1.613 m)  Weight: 165 lb 3.2 oz (74.934 kg)  SpO2: 94%     EXAM:  Gen: No overt distress HEENT: WNL Neck: Supple without LAN, thyromegaly, JVD Lungs: breath sounds moderately diminished, percussion note normal, No wheezes Cardiovascular: Normal rate, reg rhythm, no murmurs noted Abdomen: Soft, nontender, normal BS Ext: without clubbing, cyanosis, edema  DATA:   BMP Latest Ref Rng 05/09/2015 03/14/2015 03/11/2015  Glucose 70 - 99 mg/dL 112(H) - 127(H)  BUN 6 - 23 mg/dL 13 - 16  Creatinine 0.40 - 1.20 mg/dL 1.12 1.00 1.09(H)  Sodium 135 - 145 mEq/L 143 - 142  Potassium 3.5 - 5.1 mEq/L 4.0 - 3.7  Chloride 96 - 112 mEq/L 105 - 108  CO2 19 - 32 mEq/L 29 - 25  Calcium 8.4 - 10.5 mg/dL 9.6 - 9.3    CBC Latest Ref Rng 05/09/2015 03/14/2015 03/11/2015  WBC 4.0 - 10.5 K/uL 6.2 5.3 6.5  Hemoglobin 12.0 - 15.0 g/dL 12.4 11.7(L) 12.3  Hematocrit 36.0 - 46.0 % 39.0 36.6 38.2  Platelets 150.0 - 400.0 K/uL 214.0 217 201    DATA: CT chest and PFT results as above  IMPRESSION:     ICD-9-CM ICD-10-CM   1. Chronic obstructive pulmonary disease, unspecified COPD type ([place]) 496 J44.9   2. Lung nodule 793.11 R91.1      PLAN:  COPD/emphysema Cont Dulera inhaler - 2 puffs twice a day Continue Spiriva Change prednisone to 5 mg daily qod X 1 month, then stop F/U 6 wks  Lung nodule - benign, likely related to prior granulomatous disease No further F/U indicated  History of + PPD, S/P INH X 9 months    Merton Border, MD PCCM service Mobile [phone] Pager [phone] 08/07/2015

## 2015-08-07 NOTE — Telephone Encounter (Signed)
Anita Ferrell has been denied by insurance. No alternatives given. Please advise.

## 2015-08-24 NOTE — Telephone Encounter (Signed)
Has this been addressed? If not, I need to know what ICS/LABA is on the formulary. Please have one of the office MDs make that substitution next week. Thanks  Waunita Schooner

## 2015-08-25 MED ORDER — BUDESONIDE-FORMOTEROL FUMARATE 160-4.5 MCG/ACT IN AERO: 2.0000 | INHALATION_SPRAY | Freq: Two times a day (BID) | RESPIRATORY_TRACT | Status: DC

## 2015-08-25 NOTE — Telephone Encounter (Signed)
Son called back stating they received a paper stating that they wont cover her [facility] for COPD with Hypoxia. Please advise what can be prescribed in place of dulera.

## 2015-08-25 NOTE — Telephone Encounter (Signed)
Son informed of medication change. rx sent to pharmacy. Nothing further needed.

## 2015-08-25 NOTE — Telephone Encounter (Signed)
Pt LM and I called ehr back. She is going to call her insurance to see what is covered in place of the dulera and call me back. Will await call back.

## 2015-08-25 NOTE — Telephone Encounter (Signed)
LM on VM for pt to return call. 

## 2015-08-25 NOTE — Addendum Note (Signed)
Addended by: Oscar La R on: 08/25/2015 01:28 PM   Modules accepted: Orders, Medications

## 2015-08-25 NOTE — Telephone Encounter (Signed)
symbicort 160 2 puffs bid, rinse mouth after use.

## 2015-09-08 ENCOUNTER — Emergency Department: Payer: Medicare Other

## 2015-09-08 ENCOUNTER — Emergency Department (HOSPITAL_BASED_OUTPATIENT_CLINIC_OR_DEPARTMENT_OTHER)
Admit: 2015-09-08 | Discharge: 2015-09-08 | Disposition: A | Discharge status: Home / Self Care | Payer: Medicare Other | Attending: Emergency Medicine | Admitting: Emergency Medicine

## 2015-09-08 ENCOUNTER — Emergency Department
Admission: EM | Admit: 2015-09-08 | Discharge: 2015-09-08 | Disposition: A | Discharge status: Home / Self Care | Payer: Medicare Other | Attending: Emergency Medicine | Admitting: Emergency Medicine

## 2015-09-08 DIAGNOSIS — Z87891 Personal history of nicotine dependence: Secondary | ICD-10-CM | POA: Insufficient documentation

## 2015-09-08 DIAGNOSIS — I10 Essential (primary) hypertension: Secondary | ICD-10-CM | POA: Diagnosis not present

## 2015-09-08 DIAGNOSIS — R0600 Dyspnea, unspecified: Secondary | ICD-10-CM | POA: Diagnosis not present

## 2015-09-08 DIAGNOSIS — R0602 Shortness of breath: Secondary | ICD-10-CM | POA: Insufficient documentation

## 2015-09-08 DIAGNOSIS — R7989 Other specified abnormal findings of blood chemistry: Secondary | ICD-10-CM | POA: Insufficient documentation

## 2015-09-08 DIAGNOSIS — Z7951 Long term (current) use of inhaled steroids: Secondary | ICD-10-CM | POA: Diagnosis not present

## 2015-09-08 DIAGNOSIS — Z79899 Other long term (current) drug therapy: Secondary | ICD-10-CM | POA: Diagnosis not present

## 2015-09-08 DIAGNOSIS — R059 Cough, unspecified: Secondary | ICD-10-CM

## 2015-09-08 DIAGNOSIS — Z7982 Long term (current) use of aspirin: Secondary | ICD-10-CM | POA: Insufficient documentation

## 2015-09-08 DIAGNOSIS — I110 Hypertensive heart disease with heart failure: Secondary | ICD-10-CM | POA: Diagnosis not present

## 2015-09-08 DIAGNOSIS — R05 Cough: Secondary | ICD-10-CM | POA: Diagnosis not present

## 2015-09-08 DIAGNOSIS — R931 Abnormal findings on diagnostic imaging of heart and coronary circulation: Secondary | ICD-10-CM

## 2015-09-08 DIAGNOSIS — J449 Chronic obstructive pulmonary disease, unspecified: Secondary | ICD-10-CM | POA: Diagnosis not present

## 2015-09-08 DIAGNOSIS — R079 Chest pain, unspecified: Secondary | ICD-10-CM | POA: Diagnosis not present

## 2015-09-08 DIAGNOSIS — J45909 Unspecified asthma, uncomplicated: Secondary | ICD-10-CM | POA: Insufficient documentation

## 2015-09-08 DIAGNOSIS — I509 Heart failure, unspecified: Secondary | ICD-10-CM | POA: Diagnosis not present

## 2015-09-08 LAB — BASIC METABOLIC PANEL
ANION GAP: 6 (ref 5–15)
BUN: 15 mg/dL (ref 6–20)
CALCIUM: 9.1 mg/dL (ref 8.9–10.3)
CO2: 26 mmol/L (ref 22–32)
CREATININE: 1.02 mg/dL — AB (ref 0.44–1.00)
Chloride: 108 mmol/L (ref 101–111)
GFR calc Af Amer: 58 mL/min — ABNORMAL LOW (ref >60)
GFR, EST NON AFRICAN AMERICAN: 50 mL/min — AB (ref >60)
GLUCOSE: 103 mg/dL — AB (ref 65–99)
Potassium: 4 mmol/L (ref 3.5–5.1)
Sodium: 140 mmol/L (ref 135–145)

## 2015-09-08 LAB — ECHOCARDIOGRAM LIMITED

## 2015-09-08 LAB — CBC
HCT: 38 % (ref 35.0–47.0)
Hemoglobin: 12.1 g/dL (ref 12.0–16.0)
MCH: 26.5 pg (ref 26.0–34.0)
MCHC: 31.9 g/dL — AB (ref 32.0–36.0)
MCV: 83.1 fL (ref 80.0–100.0)
PLATELETS: 171 10*3/uL (ref 150–440)
RBC: 4.58 MIL/uL (ref 3.80–5.20)
RDW: 14.7 % — AB (ref 11.5–14.5)
WBC: 3.9 10*3/uL (ref 3.6–11.0)

## 2015-09-08 LAB — BRAIN NATRIURETIC PEPTIDE: B Natriuretic Peptide: 538 pg/mL — ABNORMAL HIGH (ref 0.0–100.0)

## 2015-09-08 LAB — TROPONIN I: Troponin I: <0.03 ng/mL (ref <0.031)

## 2015-09-08 MED ORDER — IPRATROPIUM-ALBUTEROL 0.5-2.5 (3) MG/3ML IN SOLN
3.0000 mL | Freq: Once | RESPIRATORY_TRACT | Status: AC
  Administered 2015-09-08: 3 mL via RESPIRATORY_TRACT
  Filled 2015-09-08: qty 3

## 2015-09-08 MED ORDER — FUROSEMIDE 40 MG PO TABS: 40.0000 mg | ORAL_TABLET | Freq: Every day | ORAL | Status: DC

## 2015-09-08 MED ORDER — POTASSIUM CHLORIDE ER 10 MEQ PO TBCR: 10.0000 meq | EXTENDED_RELEASE_TABLET | Freq: Every day | ORAL | Status: DC

## 2015-09-08 MED ORDER — FUROSEMIDE 10 MG/ML IJ SOLN
40.0000 mg | Freq: Once | INTRAMUSCULAR | Status: AC
  Administered 2015-09-08: 40 mg via INTRAVENOUS
  Filled 2015-09-08: qty 4

## 2015-09-08 NOTE — ED Notes (Addendum)
Pt ambulated in room. Pt tolerated well. SpO2 remained 93-94% RA. Dr. Mariea Clonts notified.

## 2015-09-08 NOTE — ED Provider Notes (Addendum)
[facility] Emergency Department Provider Note  ____________________________________________  Time seen: Approximately 7:16 AM  I have reviewed the triage vital signs and the nursing notes.   HISTORY  Chief Complaint Respiratory Distress    HPI Anita Ferrell is a 82 y.o. female with a history of COPD and asthma not on home oxygen, HTN, pulmonary HTN presenting for shortness of breath cough and weakness. The patient reports that for years she has had shortness of breath, but over the past 3 or 4 days, she has intermittent nonproductive coughing spells which result in shortness of breath. This is not associated with ear pain, sore throat, chest pain, fever or chills. She has not noted any lower extremity edema. No calf pain. After the coughing spasms, she often feels weak.She was previously stabilized on daily oral prednisone for years, and for the past month has been tapered off of this medication by her PMD.   Past Medical History  Diagnosis Date  . Allergy   . COPD (chronic obstructive pulmonary disease) ([place])   . Hypercholesteremia   . TB (pulmonary tuberculosis)     treated with INH therapy   . Pulmonary hypertension (HCC)     Right Ventricular systolic pressure at 60 mm Hg by echo on july of 2011; Right heart cardic catherization revealed pulmonary artery pressusre of 27/10 with a mean of; Ventricular pressure 29/10 with pulmonary capillary wedge at 9  . GERD (gastroesophageal reflux disease)   . Hypertension   . Asthma     Patient Active Problem List   Diagnosis Date Noted  . Shortness of breath 03/11/2015  . COPD with hypoxia ([place]) 03/11/2015  . Environmental allergies 01/07/2015  . Health care maintenance 05/29/2014  . Nephrolithiasis 03/13/2014  . Obesity (BMI 30-39.9) 03/10/2014  . Splenomegaly 03/05/2014  . Rectal bleeding 03/05/2014  . Hyperglycemia 12/30/2012  . Hemorrhoids 12/30/2012  . Anemia 08/27/2012  . Hypercholesterolemia  08/25/2012  . Essential hypertension 05/28/2012  . Asthma, chronic 05/28/2012  . GERD (gastroesophageal reflux disease) 05/28/2012  . Barrett's esophagus 05/28/2012    Past Surgical History  Procedure Laterality Date  . Tonsillectomy  1964  . Cataract extraction Bilateral 2008  . Cholecystectomy      Current Outpatient Rx  Name  Route  Sig  Dispense  Refill  . acetaminophen (TYLENOL) 500 MG tablet   Oral   Take 1,000 mg by mouth every 6 (six) hours as needed for mild pain.         Marland Kitchen albuterol (PROVENTIL HFA;VENTOLIN HFA) 108 (90 Base) MCG/ACT inhaler   Inhalation   Inhale 2 puffs into the lungs every 4 (four) hours as needed for wheezing or shortness of breath.   18 g   2   . amLODipine (NORVASC) 5 MG tablet   Oral   Take 1 tablet (5 mg total) by mouth daily.   30 tablet   5   . aspirin EC 81 MG tablet   Oral   Take 81 mg by mouth daily.          Marland Kitchen azelastine (ASTELIN) 0.1 % nasal spray   Each Nare   Place 1 spray into both nostrils 2 (two) times daily.   30 mL   2   . budesonide-formoterol (SYMBICORT) 160-4.5 MCG/ACT inhaler   Inhalation   Inhale 2 puffs into the lungs 2 (two) times daily.   1 Inhaler   6   . Calcium Carbonate-Vitamin D (CALCIUM 600+D) 600-400 MG-UNIT tablet   Oral  Take 1 tablet by mouth at bedtime.         . cholecalciferol (VITAMIN D) 1000 UNITS tablet   Oral   Take 1,000 Units by mouth at bedtime.         . ferrous sulfate 325 (65 FE) MG tablet   Oral   Take 325 mg by mouth at bedtime.         . fexofenadine (ALLEGRA) 180 MG tablet   Oral   Take 180 mg by mouth daily.          Marland Kitchen guaiFENesin (MUCINEX) 600 MG 12 hr tablet   Oral   Take 600 mg by mouth 2 (two) times daily.          . lansoprazole (PREVACID) 30 MG capsule   Oral   Take 30 mg by mouth daily.          . Multiple Vitamin (MULTIVITAMIN WITH MINERALS) TABS tablet   Oral   Take 1 tablet by mouth daily.         Marland Kitchen SPIRIVA HANDIHALER 18 MCG  inhalation capsule      INHALE CONTENTS OF ONE CAPSULE ONCE A DAY AS DIRECTED   90 capsule   1   . predniSONE (DELTASONE) 5 MG tablet      TAKE ONE (1) TABLET BY MOUTH EVERY DAY Patient not taking: Reported on 09/08/2015   90 tablet   0     Allergies Penicillins  Family History  Problem Relation Age of Onset  . Heart disease Mother   . Heart disease Father   . Hypertension Sister   . Heart disease Brother   . Breast cancer Maternal Aunt     Social History Social History  Substance Use Topics  . Smoking status: Former Research scientist (life sciences)  . Smokeless tobacco: Never Used     Comment: Quit in 1992  . Alcohol Use: No    Review of Systems Constitutional: No fever/chills.No lightheadedness or syncope. Positive generalized weakness. Eyes: No visual changes. No eye discharge. ENT: No sore throat. No congestion or rhinorrhea. Cardiovascular: Denies chest pain. Denies palpitations. Respiratory: Positive shortness of breath.  Positive nonproductive cough. Gastrointestinal: No abdominal pain.  No nausea, no vomiting.  No diarrhea.  No constipation. Genitourinary: Negative for dysuria. Musculoskeletal: Negative for back pain. No lower external swelling or calf pain. Skin: Negative for rash. Neurological: Negative for headaches. No focal numbness, tingling or weakness.   10-point ROS otherwise negative.  ____________________________________________   PHYSICAL EXAM:  VITAL SIGNS: ED Triage Vitals  Enc Vitals Group     BP 09/08/15 0652 140/96 mmHg     Pulse Rate 09/08/15 0652 102     Resp 09/08/15 0652 24     Temp 09/08/15 0652 97.6 F (36.4 C)     Temp Source 09/08/15 0652 Oral     SpO2 09/08/15 0652 98 %     Weight --      Height --      Head Cir --      Peak Flow --      Pain Score --      Pain Loc --      Pain Edu? --      Excl. in [place]? --     Constitutional: Alert and oriented. Well appearing and in no acute distress. Answers questions appropriately. Eyes:  Conjunctivae are normal.  EOMI. No scleral icterus. No eye discharge. Head: Atraumatic. Nose: No congestion/rhinnorhea. Mouth/Throat: Mucous membranes are a bit dry..  Neck: No  stridor.  Supple.  No JVD. No meningismus. Cardiovascular: Normal rate, regular rhythm. No murmurs, rubs or gallops.  Respiratory: Normal respiratory effort.  No accessory muscle use or retractions. Mild end expiratory wheezing without rales or rhonchi. Patient occasionally becomes mildly tachypnea when she gets anxious during my examination. Gastrointestinal: Soft, nontender and nondistended.  No guarding or rebound.  No peritoneal signs. Musculoskeletal: No LE edema. No ttp in the calves or palpable cords.  Negative Homan's sign. Neurologic:  A&Ox3.  Speech is clear.  Face and smile are symmetric.  EOMI.  Moves all extremities well. Skin:  Skin is warm, dry and intact. No rash noted. Psychiatric: Mood and affect are normal. Speech and behavior are normal.  Normal judgement.  ____________________________________________   LABS (all labs ordered are listed, but only abnormal results are displayed)  Labs Reviewed  BASIC METABOLIC PANEL - Abnormal; Notable for the following:    Glucose, Bld 103 (*)    Creatinine, Ser 1.02 (*)    GFR calc non Af Amer 50 (*)    GFR calc Af Amer 58 (*)    All other components within normal limits  BRAIN NATRIURETIC PEPTIDE - Abnormal; Notable for the following:    B Natriuretic Peptide 538.0 (*)    All other components within normal limits  CBC - Abnormal; Notable for the following:    MCHC 31.9 (*)    RDW 14.7 (*)    All other components within normal limits  TROPONIN I  URINALYSIS COMPLETEWITH MICROSCOPIC ([facility] ONLY)   ____________________________________________  EKG  ED ECG REPORT I, Eula Listen, the attending physician, personally viewed and interpreted this ECG.   Date: 09/08/2015  EKG Time: 652  Rate: 114  Rhythm: sinus tachy with frequent PVC's   Axis: leftward  Intervals:none  ST&T Change: No ST elevation  ____________________________________________  RADIOLOGY  Dg Chest 2 View  09/08/2015  CLINICAL DATA:  Shortness of breath and chest pain with cough for 3 days EXAM: CHEST  2 VIEW COMPARISON:  Multiple prior studies including computed tomography and radiography including 03/11/2015 chest radiograph FINDINGS: 9 mm nodular density over the right lower lobe unchanged compared to prior chest radiograph. Sub cm calcified granulomas stable. The heart size and vascular pattern are normal. Mild hyperventilatory change consistent with COPD. No consolidation or effusion. IMPRESSION: No radiographically acute abnormalities. Right lower lobe pulmonary nodule has been described on prior CT examinations and appears radiographically similar to prior chest radiograph. Electronically Signed   By: Skipper Cliche M.D.   On: 09/08/2015 07:47    ____________________________________________   PROCEDURES  Procedure(s) performed: None  Critical Care performed: No ____________________________________________   INITIAL IMPRESSION / ASSESSMENT AND PLAN / ED COURSE  Pertinent labs & imaging results that were available during my care of the patient were reviewed by me and considered in my medical decision making (see chart for details).  82 y.o. female with a history of COPD and asthma presenting with 3-4 days of intermittent nonproductive coughing spasms associated with shortness of breath and generalized weakness. It is possible that the patient is not tolerating her prednisone taper, but I will also evaluate her for pneumonia, consider viral URI, and consider CHF although she has not carried this diagnosis in the past she does have pulmonary hypertension. She's not been having chest pains this would be an atypical presentation of ACS or MI, but we'll check a troponin as well. In the meantime, we'll initiate treatment with breathing treatment and  reevaluate the patient after  my diagnostic workup is complete.  ----------------------------------------- 9:33 AM on 09/08/2015 -----------------------------------------  The patient is feeling slightly better. Her chest x-ray does not show any acute abnormalities, and she does not appear to have significant peripheral fluid overload, however, her BNP today is in the 500s. She has not had a history of congestive heart failure, but will need further cardiac evaluation given her recent shortness of breath and decreased exercise tolerance. It is still likely that the etiology of the patient's home and very complains is not tolerating her prednisone taper.  ----------------------------------------- 1:50 PM on 09/08/2015 -----------------------------------------  The patient was evaluated by the hospitalist, who did not feel that she warranted admission. She does have an elevated BNP and no previous BNP. I ordered an echocardiogram, which shows global left ventricular hypertrophy with a depressed EF of 35-40%. I've spoken with Dr. Yancey Flemings, the cardiologist on-call, who will see the patient tomorrow at 10 AM. In the meantime, he recommends a single dose of IV Lasix here, and then initiating oral Lasix with potassium supplementation at home. I've spoken to the patient and her family at length about the treatment plan, and we'll plan discharge at this time. Return precautions as well as follow up instructions were discussed. ____________________________________________  FINAL CLINICAL IMPRESSION(S) / ED DIAGNOSES  Final diagnoses:  Shortness of breath  Cough  Elevated brain natriuretic peptide (BNP) level      NEW MEDICATIONS STARTED DURING THIS VISIT:  New Prescriptions   No medications on file     Eula Listen, MD 09/08/15 [id number]  Eula Listen, MD 09/08/15 1350

## 2015-09-08 NOTE — Discharge Instructions (Signed)
Please start taking Lasix daily tomorrow morning. Please take the potassium supplement to prevent low potassium.  Please keep your follow-up appointment with the cardiologist, Dr. Chancy Milroy tomorrow at 10 AM.  Return to the emergency department if you develop chest pain, palpitations, shortness of breath, lightheadedness or fainting, fever, or any other symptoms concerning to you.

## 2015-09-08 NOTE — ED Notes (Signed)
Pt presents to ED from home by EMS with c/o difficulty breathing. Pt states she woke up this morning feeling short of breath. Pt states she has bfeen coughing the past few days and this morning had a coughing fit that made her feel like she couldn't catch her breath. Pt has increased work of breathing noted.

## 2015-09-08 NOTE — Progress Notes (Signed)
*  PRELIMINARY RESULTS* Echocardiogram 2D Echocardiogram has been performed.  Anita Ferrell 09/08/2015, 12:27 PM

## 2015-09-09 DIAGNOSIS — I509 Heart failure, unspecified: Secondary | ICD-10-CM | POA: Diagnosis not present

## 2015-09-09 DIAGNOSIS — I10 Essential (primary) hypertension: Secondary | ICD-10-CM | POA: Diagnosis not present

## 2015-09-09 DIAGNOSIS — E782 Mixed hyperlipidemia: Secondary | ICD-10-CM | POA: Diagnosis not present

## 2015-09-09 DIAGNOSIS — R0602 Shortness of breath: Secondary | ICD-10-CM | POA: Diagnosis not present

## 2015-09-09 DIAGNOSIS — R079 Chest pain, unspecified: Secondary | ICD-10-CM | POA: Diagnosis not present

## 2015-09-10 DIAGNOSIS — E782 Mixed hyperlipidemia: Secondary | ICD-10-CM | POA: Diagnosis not present

## 2015-09-10 DIAGNOSIS — R079 Chest pain, unspecified: Secondary | ICD-10-CM | POA: Diagnosis not present

## 2015-09-10 DIAGNOSIS — I10 Essential (primary) hypertension: Secondary | ICD-10-CM | POA: Diagnosis not present

## 2015-09-10 DIAGNOSIS — I509 Heart failure, unspecified: Secondary | ICD-10-CM | POA: Diagnosis not present

## 2015-09-10 DIAGNOSIS — R0602 Shortness of breath: Secondary | ICD-10-CM | POA: Diagnosis not present

## 2015-09-11 DIAGNOSIS — R9430 Abnormal result of cardiovascular function study, unspecified: Secondary | ICD-10-CM | POA: Diagnosis not present

## 2015-09-12 DIAGNOSIS — I10 Essential (primary) hypertension: Secondary | ICD-10-CM | POA: Diagnosis not present

## 2015-09-12 DIAGNOSIS — J449 Chronic obstructive pulmonary disease, unspecified: Secondary | ICD-10-CM | POA: Diagnosis not present

## 2015-09-12 DIAGNOSIS — I509 Heart failure, unspecified: Secondary | ICD-10-CM | POA: Diagnosis not present

## 2015-09-12 DIAGNOSIS — E782 Mixed hyperlipidemia: Secondary | ICD-10-CM | POA: Diagnosis not present

## 2015-09-12 DIAGNOSIS — J45998 Other asthma: Secondary | ICD-10-CM | POA: Diagnosis not present

## 2015-09-12 DIAGNOSIS — R0602 Shortness of breath: Secondary | ICD-10-CM | POA: Diagnosis not present

## 2015-09-16 ENCOUNTER — Encounter: Payer: Self-pay | Admitting: Pulmonary Disease

## 2015-09-16 ENCOUNTER — Ambulatory Visit (INDEPENDENT_AMBULATORY_CARE_PROVIDER_SITE_OTHER): Payer: Medicare Other | Admitting: Pulmonary Disease

## 2015-09-16 VITALS — BP 120/72 | HR 52 | Ht 64.0 in | Wt 161.0 lb

## 2015-09-16 DIAGNOSIS — I5022 Chronic systolic (congestive) heart failure: Secondary | ICD-10-CM | POA: Diagnosis not present

## 2015-09-16 DIAGNOSIS — J449 Chronic obstructive pulmonary disease, unspecified: Secondary | ICD-10-CM | POA: Diagnosis not present

## 2015-09-16 DIAGNOSIS — R0600 Dyspnea, unspecified: Secondary | ICD-10-CM

## 2015-09-16 NOTE — Progress Notes (Signed)
PULMONARY OFFICE FOLLOW UP NOTE  Requesting MD/Service: Self Date of initial consultation: 06/20/15 Reason for consultation: COPD, lung nodule  Initial office HPI (06/20/15):  36 F with history of COPD previously followed by Dr [name] (last seen by him in Aug 2016) and seen by Dr Stevenson Clinch in consultation in hospital in Nov 2016 when she was admitted with AECOPD. At that time, a CTA chest was performed revealing a new RLL nodule. Presently, she is at her baseline class III dyspnea. She has intermittent cough which is predominantly nonproductive. She has a documented history of TB which, by her description, was latent TB treated with INH for 9 months. Denies CP, fever, purulent sputum, hemoptysis, LE edema and calf tenderness. She notes that she was previously on [facility] and was discharged in Nov on Advair. She indicates that she prefers the [facility] Initial IMP/PLAN: COPD/emphysema, Lung nodule - incidental finding on CTA chest, H/O positive PPD - S/P 9 months INH. Stopped theophylline, Changed Advair to [facility] inhaler - 2 puffs twice a day, Continued Spiriva, Continued prednisone @ 5 mg daily with plan to try to reduce this slowly over time, Repeat CT chest, Follow up in 6 weeks with PFTs  CT chest 07/29/15: 1. Significant decrease in size of the previously demonstrated right lower lobe nodule, compatible with a benign process. 2. No significant change in multiple additional sub cm nodules and calcified granulomata in both lungs, compatible with a benign process. 3. Mild changes of COPD and chronic bronchitis. 4. Resolved infection in the lingula and left lower lobe  PFTs 07/29/15: moderate obstruction, normal lung volumes, mild reduction DLCO  ROV 08/04/15: No new complaints. Here to review results of PFTs and CT chest. Continue Dulera and Spiriva, change Pred to 5 mg qod for one month, then stop. F/U 6 wks  ED 09/08/15: increased dyspnea. CXR without acute findings. BNP elevated. TTE performed and  revealed LVEF 35-40%. Referred to cardiology (Dr Humphrey Rolls) who initiated therapy for CHF. Nuclear scan and CT coronary angiogram reportedly without evidence of ischemia.   ROV 09/16/15: Off prednisone without any worsening of symptoms   SUBJ: No new complaints. Events o last week as above. Has been off of prednisone for 2 weeks without increase in airways symptoms. Denies CP, fever, purulent sputum, hemoptysis, LE edema and calf tenderness  ONJ: Filed Vitals:   09/16/15 0943  BP: 120/72  Pulse: 52  Height: 5\' 4"  (1.626 m)  Weight: 161 lb (73.029 kg)  SpO2: 93%     EXAM:  Gen: No overt distress HEENT: WNL Neck: Supple without LAN, thyromegaly, JVD Lungs: breath sounds mildly diminished, percussion note normal, No wheezes Cardiovascular: Frequent extrasystoles, normal rate, no murmurs noted Abdomen: Soft, nontender, normal BS Ext: without clubbing, cyanosis, edema  DATA:   BMP Latest Ref Rng 09/08/2015 05/09/2015 03/14/2015  Glucose 65 - 99 mg/dL 103(H) 112(H) -  BUN 6 - 20 mg/dL 15 13 -  Creatinine 0.44 - 1.00 mg/dL 1.02(H) 1.12 1.00  Sodium 135 - 145 mmol/L 140 143 -  Potassium 3.5 - 5.1 mmol/L 4.0 4.0 -  Chloride 101 - 111 mmol/L 108 105 -  CO2 22 - 32 mmol/L 26 29 -  Calcium 8.9 - 10.3 mg/dL 9.1 9.6 -    CBC Latest Ref Rng 09/08/2015 05/09/2015 03/14/2015  WBC 3.6 - 11.0 K/uL 3.9 6.2 5.3  Hemoglobin 12.0 - 16.0 g/dL 12.1 12.4 11.7(L)  Hematocrit 35.0 - 47.0 % 38.0 39.0 36.6  Platelets 150 - 440 K/uL 171 214.0 217  CXR 09/08/15: NACPD   IMPRESSION:     ICD-9-CM ICD-10-CM   1. Chronic obstructive pulmonary disease, unspecified COPD type ([place]) 496 J44.9   2. Dyspnea 786.09 R06.00   3. Chronic systolic congestive heart failure (HCC) 428.22 I50.22    428.0    1) COPD - mostly fixed obstruction. Tolerating discontinuation of prednisone 2) CHF - new dx being evaluated by Dr Humphrey Rolls 3) Dyspnea - multifactorial 4) H/O positive PPD, s/p INH X 9 months 4) lung nodule -  stable. Likely benign and related to prior granulomatous disease. No further F/U indicated  PLAN:  Cont Symbicort and Spiriva Cont PRN albuterol ROV in 3 months or as needed    Merton Border, MD PCCM service Mobile [phone] Pager [phone] 09/16/2015

## 2015-09-26 DIAGNOSIS — I509 Heart failure, unspecified: Secondary | ICD-10-CM | POA: Diagnosis not present

## 2015-09-26 DIAGNOSIS — I10 Essential (primary) hypertension: Secondary | ICD-10-CM | POA: Diagnosis not present

## 2015-10-02 ENCOUNTER — Other Ambulatory Visit: Payer: Self-pay | Admitting: Pulmonary Disease

## 2015-10-14 ENCOUNTER — Other Ambulatory Visit (INDEPENDENT_AMBULATORY_CARE_PROVIDER_SITE_OTHER): Payer: Medicare Other

## 2015-10-14 DIAGNOSIS — R739 Hyperglycemia, unspecified: Secondary | ICD-10-CM | POA: Diagnosis not present

## 2015-10-14 DIAGNOSIS — I10 Essential (primary) hypertension: Secondary | ICD-10-CM | POA: Diagnosis not present

## 2015-10-14 DIAGNOSIS — E7800 Pure hypercholesterolemia, unspecified: Secondary | ICD-10-CM

## 2015-10-14 LAB — BASIC METABOLIC PANEL
BUN: 24 mg/dL — AB (ref 6–23)
CALCIUM: 9.6 mg/dL (ref 8.4–10.5)
CO2: 33 meq/L — AB (ref 19–32)
CREATININE: 1.43 mg/dL — AB (ref 0.40–1.20)
Chloride: 102 mEq/L (ref 96–112)
GFR: 37.39 mL/min — ABNORMAL LOW (ref >60.00)
GLUCOSE: 107 mg/dL — AB (ref 70–99)
Potassium: 4.2 mEq/L (ref 3.5–5.1)
Sodium: 141 mEq/L (ref 135–145)

## 2015-10-14 LAB — HEPATIC FUNCTION PANEL
ALK PHOS: 59 U/L (ref 39–117)
ALT: 13 U/L (ref 0–35)
AST: 16 U/L (ref 0–37)
Albumin: 3.7 g/dL (ref 3.5–5.2)
BILIRUBIN TOTAL: 0.5 mg/dL (ref 0.2–1.2)
Bilirubin, Direct: 0.1 mg/dL (ref 0.0–0.3)
Total Protein: 6.2 g/dL (ref 6.0–8.3)

## 2015-10-14 LAB — LIPID PANEL
CHOLESTEROL: 179 mg/dL (ref 0–200)
HDL: 38.9 mg/dL — ABNORMAL LOW (ref >39.00)
LDL Cholesterol: 110 mg/dL — ABNORMAL HIGH (ref 0–99)
NonHDL: 139.98
Total CHOL/HDL Ratio: 5
Triglycerides: 151 mg/dL — ABNORMAL HIGH (ref 0.0–149.0)
VLDL: 30.2 mg/dL (ref 0.0–40.0)

## 2015-10-14 LAB — HEMOGLOBIN A1C: HEMOGLOBIN A1C: 6 % (ref 4.6–6.5)

## 2015-10-15 ENCOUNTER — Ambulatory Visit (INDEPENDENT_AMBULATORY_CARE_PROVIDER_SITE_OTHER): Payer: Medicare Other | Admitting: Internal Medicine

## 2015-10-15 ENCOUNTER — Encounter: Payer: Self-pay | Admitting: Internal Medicine

## 2015-10-15 VITALS — BP 130/80 | HR 53 | Temp 98.5°F | Resp 18 | Ht 64.0 in | Wt 158.5 lb

## 2015-10-15 DIAGNOSIS — I10 Essential (primary) hypertension: Secondary | ICD-10-CM

## 2015-10-15 DIAGNOSIS — J4520 Mild intermittent asthma, uncomplicated: Secondary | ICD-10-CM | POA: Diagnosis not present

## 2015-10-15 DIAGNOSIS — K2100 Gastro-esophageal reflux disease with esophagitis, without bleeding: Secondary | ICD-10-CM

## 2015-10-15 DIAGNOSIS — R0902 Hypoxemia: Secondary | ICD-10-CM

## 2015-10-15 DIAGNOSIS — D649 Anemia, unspecified: Secondary | ICD-10-CM

## 2015-10-15 DIAGNOSIS — E7800 Pure hypercholesterolemia, unspecified: Secondary | ICD-10-CM | POA: Diagnosis not present

## 2015-10-15 DIAGNOSIS — N183 Chronic kidney disease, stage 3 unspecified: Secondary | ICD-10-CM

## 2015-10-15 DIAGNOSIS — R739 Hyperglycemia, unspecified: Secondary | ICD-10-CM

## 2015-10-15 DIAGNOSIS — R0602 Shortness of breath: Secondary | ICD-10-CM

## 2015-10-15 DIAGNOSIS — J449 Chronic obstructive pulmonary disease, unspecified: Secondary | ICD-10-CM

## 2015-10-15 NOTE — Progress Notes (Signed)
Pre-visit discussion using our clinic review tool. No additional management support is needed unless otherwise documented below in the visit note.  

## 2015-10-15 NOTE — Progress Notes (Signed)
Patient ID: Anita Ferrell, female   DOB: [birth year], 82 y.o.   MRN: [id number]   Subjective:    Patient ID: Anita Ferrell, female    DOB: 12/08/1932, 82 y.o.   MRN: [id number]  HPI  Patient with past history of hypercholesterolemia, COPD, pulmonary hypertension, hypertension and GERD.  Here to follow up on these issues.  Was recently evaluated in the ER for sob.  ER note reviewed.  They were concerned about bronchitis and CHF.  CT as outlined.  She was placed on lasix.  Saw cardiology.  States had nuclear stress test, etc.  States arteries looked good.  Was placed on entresto.  Remains on lasix and aldactone.  Recent labs revealed elevated creatinine.  She states that her breathing is gradually improving.  No chest pain.  Still notices sob with exertion, but is some better.  Still with some cough.  No acid reflux.  No abdominal pain or cramping.  Bowel stable.     Past Medical History  Diagnosis Date  . Allergy   . COPD (chronic obstructive pulmonary disease) ([place])   . Hypercholesteremia   . TB (pulmonary tuberculosis)     treated with INH therapy   . Pulmonary hypertension ([facility])     Right Ventricular systolic pressure at 60 mm Hg by echo on july of 2011; Right heart cardic catherization revealed pulmonary artery pressusre of 27/10 with a mean of; Ventricular pressure 29/10 with pulmonary capillary wedge at 9  . GERD (gastroesophageal reflux disease)   . Hypertension   . Asthma    Past Surgical History  Procedure Laterality Date  . Tonsillectomy  1964  . Cataract extraction Bilateral 2008  . Cholecystectomy     Family History  Problem Relation Age of Onset  . Heart disease Mother   . Heart disease Father   . Hypertension Sister   . Heart disease Brother   . Breast cancer Maternal Aunt    Social History   Social History  . Marital Status: Widowed    Spouse Name: N/A  . Number of Children: 3  . Years of Education: N/A   Social History Main Topics  . Smoking status:  Former Research scientist (life sciences)  . Smokeless tobacco: Never Used     Comment: Quit in 1992  . Alcohol Use: No  . Drug Use: No  . Sexual Activity: No   Other Topics Concern  . None   Social History Narrative    Outpatient Encounter Prescriptions as of 10/15/2015  Medication Sig  . albuterol (PROVENTIL HFA;VENTOLIN HFA) 108 (90 Base) MCG/ACT inhaler Inhale 2 puffs into the lungs every 4 (four) hours as needed for wheezing or shortness of breath.  Marland Kitchen aspirin EC 81 MG tablet Take 81 mg by mouth daily.   Marland Kitchen azelastine (ASTELIN) 0.1 % nasal spray Place 1 spray into both nostrils 2 (two) times daily.  . budesonide-formoterol (SYMBICORT) 160-4.5 MCG/ACT inhaler Inhale 2 puffs into the lungs 2 (two) times daily.  . Calcium Carbonate-Vitamin D (CALCIUM 600+D) 600-400 MG-UNIT tablet Take 1 tablet by mouth at bedtime.  . carvedilol (COREG) 3.125 MG tablet Take 3.125 mg by mouth 2 (two) times daily with a meal.   . ferrous sulfate 325 (65 FE) MG tablet Take 325 mg by mouth at bedtime.  . fexofenadine (ALLEGRA) 180 MG tablet Take 180 mg by mouth daily.   . furosemide (LASIX) 40 MG tablet Take 1 tablet (40 mg total) by mouth daily.  . lansoprazole (PREVACID) 30 MG capsule Take  30 mg by mouth daily.   . Multiple Vitamins-Minerals (CENTRUM SILVER PO) Take by mouth.  . sacubitril-valsartan (ENTRESTO) 24-26 MG Take 1 tablet by mouth 2 (two) times daily.  Marland Kitchen SPIRIVA HANDIHALER 18 MCG inhalation capsule INHALE CONTENTS OF ONE CAPSULE ONCE A DAY AS DIRECTED  . spironolactone (ALDACTONE) 25 MG tablet Take 25 mg by mouth daily.   . [DISCONTINUED] carvedilol (COREG) 25 MG tablet Take 25 mg by mouth 2 (two) times daily with a meal.  . [DISCONTINUED] Multiple Vitamin (MULTIVITAMIN WITH MINERALS) TABS tablet Take 1 tablet by mouth daily.  Marland Kitchen guaiFENesin (MUCINEX) 600 MG 12 hr tablet Take 600 mg by mouth 2 (two) times daily. Reported on 10/15/2015  . meclizine (ANTIVERT) 25 MG tablet Take 25 mg by mouth 3 (three) times daily as needed  for dizziness. Reported on 10/15/2015  . [DISCONTINUED] acetaminophen (TYLENOL) 500 MG tablet Take 1,000 mg by mouth every 6 (six) hours as needed for mild pain.   No facility-administered encounter medications on file as of 10/15/2015.    Review of Systems  Constitutional: Negative for appetite change and unexpected weight change.  HENT: Negative for congestion and sinus pressure.   Respiratory: Positive for cough and shortness of breath. Negative for chest tightness.   Cardiovascular: Negative for chest pain, palpitations and leg swelling.  Gastrointestinal: Negative for nausea, vomiting, abdominal pain and diarrhea.  Genitourinary: Negative for dysuria and difficulty urinating.  Musculoskeletal: Negative for back pain and joint swelling.  Skin: Negative for color change and rash.  Neurological: Negative for dizziness, light-headedness and headaches.  Psychiatric/Behavioral: Negative for dysphoric mood and agitation.       Objective:     Blood pressure rechecked by me:  128/68  Physical Exam  Constitutional: She is oriented to person, place, and time. She appears well-developed and well-nourished. No distress.  HENT:  Nose: Nose normal.  Mouth/Throat: Oropharynx is clear and moist.  Eyes: Right eye exhibits no discharge. Left eye exhibits no discharge. No scleral icterus.  Neck: Neck supple. No thyromegaly present.  Cardiovascular: Normal rate and regular rhythm.   Pulmonary/Chest: Breath sounds normal. No accessory muscle usage. No tachypnea. No respiratory distress. She has no decreased breath sounds. She has no wheezes. She has no rhonchi. Right breast exhibits no inverted nipple, no mass, no nipple discharge and no tenderness (no axillary adenopathy). Left breast exhibits no inverted nipple, no mass, no nipple discharge and no tenderness (no axilarry adenopathy).  Abdominal: Soft. Bowel sounds are normal. There is no tenderness.  Musculoskeletal: She exhibits no edema or  tenderness.  Lymphadenopathy:    She has no cervical adenopathy.  Neurological: She is alert and oriented to person, place, and time.  Skin: Skin is warm. No rash noted. No erythema.  Psychiatric: She has a normal mood and affect. Her behavior is normal.    BP 130/80 mmHg  Pulse 53  Temp(Src) 98.5 F (36.9 C) (Oral)  Resp 18  Ht '5\' 4"'$  (1.626 m)  Wt 158 lb 8 oz (71.895 kg)  BMI 27.19 kg/m2  SpO2 95% Wt Readings from Last 3 Encounters:  10/15/15 158 lb 8 oz (71.895 kg)  09/16/15 161 lb (73.029 kg)  08/04/15 165 lb 3.2 oz (74.934 kg)     Lab Results  Component Value Date   WBC 3.9 09/08/2015   HGB 12.1 09/08/2015   HCT 38.0 09/08/2015   PLT 171 09/08/2015   GLUCOSE 107* 10/14/2015   CHOL 179 10/14/2015   TRIG 151.0* 10/14/2015  HDL 38.90* 10/14/2015   LDLDIRECT 127.1 08/25/2012   LDLCALC 110* 10/14/2015   ALT 13 10/14/2015   AST 16 10/14/2015   NA 141 10/14/2015   K 4.2 10/14/2015   CL 102 10/14/2015   CREATININE 1.43* 10/14/2015   BUN 24* 10/14/2015   CO2 33* 10/14/2015   TSH 0.63 05/09/2015   HGBA1C 6.0 10/14/2015    Dg Chest 2 View  09/08/2015  CLINICAL DATA:  Shortness of breath and chest pain with cough for 3 days EXAM: CHEST  2 VIEW COMPARISON:  Multiple prior studies including computed tomography and radiography including 03/11/2015 chest radiograph FINDINGS: 9 mm nodular density over the right lower lobe unchanged compared to prior chest radiograph. Sub cm calcified granulomas stable. The heart size and vascular pattern are normal. Mild hyperventilatory change consistent with COPD. No consolidation or effusion. IMPRESSION: No radiographically acute abnormalities. Right lower lobe pulmonary nodule has been described on prior CT examinations and appears radiographically similar to prior chest radiograph. Electronically Signed   By: Skipper Cliche M.D.   On: 09/08/2015 07:47       Assessment & Plan:   Problem List Items Addressed This Visit    Anemia     Has had extensive w/up.  Evaluated by dr Ma Hillock.  Follow cbc.       Asthma, chronic    Followed by pulmonary.        CKD (chronic kidney disease) stage 3, GFR 30-59 ml/min    Creatinine increased more recently.  Decreasing GFR.  On entresto, lasix and aldactone.  Feel need to adjust medication.  Followed by cardiology.  D/w Dr Chancy Milroy.        COPD with hypoxia ([place])    Follow by Dr Alva Garnet.        Essential hypertension - Primary    Blood pressure under good control.  Continue same medication regimen.  Follow pressures.  Follow metabolic panel.        Relevant Medications   spironolactone (ALDACTONE) 25 MG tablet   carvedilol (COREG) 3.125 MG tablet   GERD (gastroesophageal reflux disease)    Controlled on prevacid.        Relevant Medications   meclizine (ANTIVERT) 25 MG tablet   Hypercholesterolemia    Low cholesterol diet and exercise.  Follow lipid panel.       Relevant Medications   spironolactone (ALDACTONE) 25 MG tablet   carvedilol (COREG) 3.125 MG tablet   Hyperglycemia    Low carb diet and exercise.  Follow met b and a1c.       Shortness of breath    Recently evaluated in ER.  Concern over CHF.  Started on lasix.  Had cardiac w/up. Obtain records.  Seeing Dr Chancy Milroy.  Was placed on entresto.  D/w Dr Chancy Milroy.  Increasing creatinine.  Adjust medications.  Breathing improved some.  Continue f/u with pulmonary.           [name], MD

## 2015-10-16 ENCOUNTER — Telehealth: Payer: Self-pay | Admitting: Internal Medicine

## 2015-10-16 ENCOUNTER — Encounter: Payer: Self-pay | Admitting: Internal Medicine

## 2015-10-16 DIAGNOSIS — R7989 Other specified abnormal findings of blood chemistry: Secondary | ICD-10-CM

## 2015-10-16 DIAGNOSIS — N183 Chronic kidney disease, stage 3 unspecified: Secondary | ICD-10-CM | POA: Insufficient documentation

## 2015-10-16 NOTE — Assessment & Plan Note (Signed)
Controlled on prevacid.   

## 2015-10-16 NOTE — Assessment & Plan Note (Signed)
Low cholesterol diet and exercise.  Follow lipid panel.   

## 2015-10-16 NOTE — Assessment & Plan Note (Signed)
Followed by pulmonary 

## 2015-10-16 NOTE — Telephone Encounter (Signed)
Spoke to Dr Chancy Milroy.  Pt was instructed to stop lasix.  Recheck met b 10/23/15 at 9:00.  Please place on lab schedule.  Pt aware of appt.

## 2015-10-16 NOTE — Assessment & Plan Note (Signed)
Creatinine increased more recently.  Decreasing GFR.  On entresto, lasix and aldactone.  Feel need to adjust medication.  Followed by cardiology.  D/w Dr Chancy Milroy.

## 2015-10-16 NOTE — Assessment & Plan Note (Signed)
Recently evaluated in ER.  Concern over CHF.  Started on lasix.  Had cardiac w/up. Obtain records.  Seeing Dr Chancy Milroy.  Was placed on entresto.  D/w Dr Chancy Milroy.  Increasing creatinine.  Adjust medications.  Breathing improved some.  Continue f/u with pulmonary.

## 2015-10-16 NOTE — Assessment & Plan Note (Signed)
Has had extensive w/up.  Evaluated by dr Ma Hillock.  Follow cbc.

## 2015-10-16 NOTE — Assessment & Plan Note (Signed)
Follow by Dr Alva Garnet.

## 2015-10-16 NOTE — Assessment & Plan Note (Signed)
Blood pressure under good control.  Continue same medication regimen.  Follow pressures.  Follow metabolic panel.   

## 2015-10-16 NOTE — Telephone Encounter (Signed)
Pt placed on lab schedule

## 2015-10-16 NOTE — Assessment & Plan Note (Signed)
Low carb diet and exercise.  Follow met b and a1c.  

## 2015-10-23 ENCOUNTER — Other Ambulatory Visit (INDEPENDENT_AMBULATORY_CARE_PROVIDER_SITE_OTHER): Payer: Medicare Other

## 2015-10-23 DIAGNOSIS — R748 Abnormal levels of other serum enzymes: Secondary | ICD-10-CM | POA: Diagnosis not present

## 2015-10-23 DIAGNOSIS — R7989 Other specified abnormal findings of blood chemistry: Secondary | ICD-10-CM

## 2015-10-23 LAB — BASIC METABOLIC PANEL
BUN: 15 mg/dL (ref 6–23)
CALCIUM: 9.5 mg/dL (ref 8.4–10.5)
CO2: 28 mEq/L (ref 19–32)
Chloride: 107 mEq/L (ref 96–112)
Creatinine, Ser: 1.29 mg/dL — ABNORMAL HIGH (ref 0.40–1.20)
GFR: 42.11 mL/min — AB (ref >60.00)
GLUCOSE: 104 mg/dL — AB (ref 70–99)
Potassium: 4.7 mEq/L (ref 3.5–5.1)
SODIUM: 141 meq/L (ref 135–145)

## 2015-10-24 ENCOUNTER — Encounter: Payer: Self-pay | Admitting: *Deleted

## 2015-10-24 ENCOUNTER — Other Ambulatory Visit: Payer: Self-pay | Admitting: Internal Medicine

## 2015-10-24 DIAGNOSIS — R7989 Other specified abnormal findings of blood chemistry: Secondary | ICD-10-CM

## 2015-10-24 NOTE — Progress Notes (Signed)
Order placed for f/u met b.  

## 2015-10-28 DIAGNOSIS — J40 Bronchitis, not specified as acute or chronic: Secondary | ICD-10-CM | POA: Diagnosis not present

## 2015-10-28 DIAGNOSIS — J449 Chronic obstructive pulmonary disease, unspecified: Secondary | ICD-10-CM | POA: Diagnosis not present

## 2015-10-28 DIAGNOSIS — E782 Mixed hyperlipidemia: Secondary | ICD-10-CM | POA: Diagnosis not present

## 2015-10-28 DIAGNOSIS — R0602 Shortness of breath: Secondary | ICD-10-CM | POA: Diagnosis not present

## 2015-10-28 DIAGNOSIS — J159 Unspecified bacterial pneumonia: Secondary | ICD-10-CM | POA: Diagnosis not present

## 2015-10-28 DIAGNOSIS — I509 Heart failure, unspecified: Secondary | ICD-10-CM | POA: Diagnosis not present

## 2015-10-28 DIAGNOSIS — A0222 Salmonella pneumonia: Secondary | ICD-10-CM | POA: Diagnosis not present

## 2015-10-28 DIAGNOSIS — I10 Essential (primary) hypertension: Secondary | ICD-10-CM | POA: Diagnosis not present

## 2015-11-14 ENCOUNTER — Other Ambulatory Visit (INDEPENDENT_AMBULATORY_CARE_PROVIDER_SITE_OTHER): Payer: Medicare Other

## 2015-11-14 DIAGNOSIS — R748 Abnormal levels of other serum enzymes: Secondary | ICD-10-CM

## 2015-11-14 DIAGNOSIS — R7989 Other specified abnormal findings of blood chemistry: Secondary | ICD-10-CM

## 2015-11-14 LAB — BASIC METABOLIC PANEL
BUN: 15 mg/dL (ref 6–23)
CHLORIDE: 107 meq/L (ref 96–112)
CO2: 27 mEq/L (ref 19–32)
CREATININE: 1.26 mg/dL — AB (ref 0.40–1.20)
Calcium: 9.8 mg/dL (ref 8.4–10.5)
GFR: 43.26 mL/min — AB (ref >60.00)
Glucose, Bld: 113 mg/dL — ABNORMAL HIGH (ref 70–99)
POTASSIUM: 4.7 meq/L (ref 3.5–5.1)
Sodium: 140 mEq/L (ref 135–145)

## 2015-11-17 ENCOUNTER — Telehealth: Payer: Self-pay | Admitting: *Deleted

## 2015-11-17 DIAGNOSIS — N289 Disorder of kidney and ureter, unspecified: Secondary | ICD-10-CM

## 2015-11-17 NOTE — Telephone Encounter (Signed)
Future lab order placed for BMP

## 2015-12-09 DIAGNOSIS — I509 Heart failure, unspecified: Secondary | ICD-10-CM | POA: Diagnosis not present

## 2015-12-09 DIAGNOSIS — R0602 Shortness of breath: Secondary | ICD-10-CM | POA: Diagnosis not present

## 2015-12-09 DIAGNOSIS — E782 Mixed hyperlipidemia: Secondary | ICD-10-CM | POA: Diagnosis not present

## 2015-12-09 DIAGNOSIS — I10 Essential (primary) hypertension: Secondary | ICD-10-CM | POA: Diagnosis not present

## 2015-12-15 ENCOUNTER — Other Ambulatory Visit (INDEPENDENT_AMBULATORY_CARE_PROVIDER_SITE_OTHER): Payer: Medicare Other

## 2015-12-15 DIAGNOSIS — N289 Disorder of kidney and ureter, unspecified: Secondary | ICD-10-CM

## 2015-12-15 LAB — BASIC METABOLIC PANEL
BUN: 13 mg/dL (ref 6–23)
CHLORIDE: 107 meq/L (ref 96–112)
CO2: 28 mEq/L (ref 19–32)
Calcium: 9.2 mg/dL (ref 8.4–10.5)
Creatinine, Ser: 1.13 mg/dL (ref 0.40–1.20)
GFR: 49.04 mL/min — AB (ref >60.00)
GLUCOSE: 111 mg/dL — AB (ref 70–99)
POTASSIUM: 4.3 meq/L (ref 3.5–5.1)
SODIUM: 140 meq/L (ref 135–145)

## 2015-12-17 ENCOUNTER — Encounter: Payer: Self-pay | Admitting: Internal Medicine

## 2015-12-18 ENCOUNTER — Ambulatory Visit: Payer: PRIVATE HEALTH INSURANCE | Admitting: Pulmonary Disease

## 2015-12-26 ENCOUNTER — Encounter: Payer: Self-pay | Admitting: Pulmonary Disease

## 2015-12-26 ENCOUNTER — Ambulatory Visit (INDEPENDENT_AMBULATORY_CARE_PROVIDER_SITE_OTHER): Payer: Medicare Other | Admitting: Pulmonary Disease

## 2015-12-26 VITALS — BP 118/62 | HR 65 | Ht 65.5 in | Wt 155.0 lb

## 2015-12-26 DIAGNOSIS — J449 Chronic obstructive pulmonary disease, unspecified: Secondary | ICD-10-CM

## 2015-12-26 NOTE — Patient Instructions (Signed)
Trial off of Spiriva inhaler  Discuss your blood pressure meds with Dr Nicki Reaper  Follow up in 4 months

## 2015-12-26 NOTE — Progress Notes (Signed)
PULMONARY OFFICE FOLLOW UP NOTE  Requesting MD/Service: Self Date of initial consultation: 06/20/15 Reason for consultation: COPD, lung nodule  Initial office HPI (06/20/15):  63 F with history of COPD previously followed by Dr [name] (last seen by him in Aug 2016) and seen by Dr Stevenson Clinch in consultation in hospital in Nov 2016 when she was admitted with AECOPD. At that time, a CTA chest was performed revealing a new RLL nodule. Presently, she is at her baseline class III dyspnea. She has intermittent cough which is predominantly nonproductive. She has a documented history of TB which, by her description, was latent TB treated with INH for 9 months. Denies CP, fever, purulent sputum, hemoptysis, LE edema and calf tenderness. She notes that she was previously on [facility] and was discharged in Nov on Advair. She indicates that she prefers the [facility] Initial IMP/PLAN: COPD/emphysema, Lung nodule - incidental finding on CTA chest, H/O positive PPD - S/P 9 months INH. Stopped theophylline, Changed Advair to [facility] inhaler - 2 puffs twice a day, Continued Spiriva, Continued prednisone @ 5 mg daily with plan to try to reduce this slowly over time, Repeat CT chest, Follow up in 6 weeks with PFTs  CT chest 07/29/15: 1. Significant decrease in size of the previously demonstrated right lower lobe nodule, compatible with a benign process. 2. No significant change in multiple additional sub cm nodules and calcified granulomata in both lungs, compatible with a benign process. 3. Mild changes of COPD and chronic bronchitis. 4. Resolved infection in the lingula and left lower lobe  PFTs 07/29/15: moderate obstruction, normal lung volumes, mild reduction DLCO  ROV 08/04/15: No new complaints. Here to review results of PFTs and CT chest. Continue Dulera and Spiriva, change Pred to 5 mg qod for one month, then stop. F/U 6 wks  ED 09/08/15: increased dyspnea. CXR without acute findings. BNP elevated. TTE performed and  revealed LVEF 35-40%. Referred to cardiology (Dr Humphrey Rolls) who initiated therapy for CHF. Nuclear scan and CT coronary angiogram reportedly without evidence of ischemia.   ROV 09/16/15: Off prednisone without any worsening of symptoms ROV 12/26/15: No new complaints or problems. Overall improved. Never uses rescue MDI. Trial off Spiriva  SUBJ: No new complaints. Dyspnea improved. Never uses rescue MDI. Denes CP, fever, purulent sputum, hemoptysis, LE edema and calf tenderness  ONJ: Vitals:   12/26/15 1155  BP: 118/62  Pulse: 65  SpO2: 95%  Weight: 155 lb (70.3 kg)  Height: 5' 5.5" (1.664 m)     EXAM:  Gen: No overt distress HEENT: WNL Neck: Supple without, JVD Lungs: breath sounds mildly diminished, No wheezes Cardiovascular: Reg, no murmurs noted Abdomen: Soft, nontender, normal BS Ext: without clubbing, cyanosis, edema  DATA:   BMP Latest Ref Rng & Units 12/15/2015 11/14/2015 10/23/2015  Glucose 70 - 99 mg/dL 111(H) 113(H) 104(H)  BUN 6 - 23 mg/dL 13 15 15   Creatinine 0.40 - 1.20 mg/dL 1.13 1.26(H) 1.29(H)  Sodium 135 - 145 mEq/L 140 140 141  Potassium 3.5 - 5.1 mEq/L 4.3 4.7 4.7  Chloride 96 - 112 mEq/L 107 107 107  CO2 19 - 32 mEq/L 28 27 28   Calcium 8.4 - 10.5 mg/dL 9.2 9.8 9.5    CBC Latest Ref Rng & Units 09/08/2015 05/09/2015 03/14/2015  WBC 3.6 - 11.0 K/uL 3.9 6.2 5.3  Hemoglobin 12.0 - 16.0 g/dL 12.1 12.4 11.7(L)  Hematocrit 35.0 - 47.0 % 38.0 39.0 36.6  Platelets 150 - 440 K/uL 171 214.0 217  CXR: NNF   IMPRESSION:   COPD, moderate - mostly fixed obstruction. Not clear that she requires 2 maintenance inhalers  Borderline asymptomatic hypotension  PLAN:  Cont Symbicort Trial off Spiriva, remain off it if symptoms no worse Cont PRN albuterol Dr Nicki Reaper to address BP issues ROV in 4 months or as needed   Merton Border, MD PCCM service Mobile [phone] Pager [phone] 12/28/2015

## 2016-01-13 ENCOUNTER — Ambulatory Visit (INDEPENDENT_AMBULATORY_CARE_PROVIDER_SITE_OTHER): Payer: Medicare Other | Admitting: Internal Medicine

## 2016-01-13 ENCOUNTER — Encounter: Payer: Self-pay | Admitting: Internal Medicine

## 2016-01-13 VITALS — BP 120/68 | HR 51 | Temp 97.6°F | Wt 154.0 lb

## 2016-01-13 DIAGNOSIS — I499 Cardiac arrhythmia, unspecified: Secondary | ICD-10-CM | POA: Diagnosis not present

## 2016-01-13 DIAGNOSIS — R0902 Hypoxemia: Secondary | ICD-10-CM

## 2016-01-13 DIAGNOSIS — K2100 Gastro-esophageal reflux disease with esophagitis, without bleeding: Secondary | ICD-10-CM

## 2016-01-13 DIAGNOSIS — R0602 Shortness of breath: Secondary | ICD-10-CM | POA: Diagnosis not present

## 2016-01-13 DIAGNOSIS — D649 Anemia, unspecified: Secondary | ICD-10-CM

## 2016-01-13 DIAGNOSIS — R739 Hyperglycemia, unspecified: Secondary | ICD-10-CM | POA: Diagnosis not present

## 2016-01-13 DIAGNOSIS — R197 Diarrhea, unspecified: Secondary | ICD-10-CM

## 2016-01-13 DIAGNOSIS — I10 Essential (primary) hypertension: Secondary | ICD-10-CM | POA: Diagnosis not present

## 2016-01-13 DIAGNOSIS — N183 Chronic kidney disease, stage 3 unspecified: Secondary | ICD-10-CM

## 2016-01-13 DIAGNOSIS — I493 Ventricular premature depolarization: Secondary | ICD-10-CM | POA: Diagnosis not present

## 2016-01-13 DIAGNOSIS — E782 Mixed hyperlipidemia: Secondary | ICD-10-CM | POA: Diagnosis not present

## 2016-01-13 DIAGNOSIS — E7800 Pure hypercholesterolemia, unspecified: Secondary | ICD-10-CM

## 2016-01-13 DIAGNOSIS — K649 Unspecified hemorrhoids: Secondary | ICD-10-CM

## 2016-01-13 DIAGNOSIS — J449 Chronic obstructive pulmonary disease, unspecified: Secondary | ICD-10-CM

## 2016-01-13 DIAGNOSIS — Z23 Encounter for immunization: Secondary | ICD-10-CM

## 2016-01-13 DIAGNOSIS — I509 Heart failure, unspecified: Secondary | ICD-10-CM | POA: Diagnosis not present

## 2016-01-13 LAB — BASIC METABOLIC PANEL
BUN: 16 mg/dL (ref 6–23)
CALCIUM: 9.6 mg/dL (ref 8.4–10.5)
CO2: 32 mEq/L (ref 19–32)
CREATININE: 1.15 mg/dL (ref 0.40–1.20)
Chloride: 107 mEq/L (ref 96–112)
GFR: 48.05 mL/min — AB (ref >60.00)
Glucose, Bld: 105 mg/dL — ABNORMAL HIGH (ref 70–99)
Potassium: 4.7 mEq/L (ref 3.5–5.1)
SODIUM: 143 meq/L (ref 135–145)

## 2016-01-13 LAB — HEPATIC FUNCTION PANEL
ALBUMIN: 3.6 g/dL (ref 3.5–5.2)
ALK PHOS: 62 U/L (ref 39–117)
ALT: 12 U/L (ref 0–35)
AST: 18 U/L (ref 0–37)
Bilirubin, Direct: 0 mg/dL (ref 0.0–0.3)
Total Bilirubin: 0.6 mg/dL (ref 0.2–1.2)
Total Protein: 6.1 g/dL (ref 6.0–8.3)

## 2016-01-13 LAB — CBC WITH DIFFERENTIAL/PLATELET
BASOS PCT: 0.5 % (ref 0.0–3.0)
Basophils Absolute: 0 10*3/uL (ref 0.0–0.1)
EOS ABS: 0.2 10*3/uL (ref 0.0–0.7)
EOS PCT: 4.2 % (ref 0.0–5.0)
HCT: 34.5 % — ABNORMAL LOW (ref 36.0–46.0)
Hemoglobin: 11.3 g/dL — ABNORMAL LOW (ref 12.0–15.0)
LYMPHS ABS: 1 10*3/uL (ref 0.7–4.0)
Lymphocytes Relative: 23.3 % (ref 12.0–46.0)
MCHC: 32.9 g/dL (ref 30.0–36.0)
MCV: 83.4 fl (ref 78.0–100.0)
MONO ABS: 0.3 10*3/uL (ref 0.1–1.0)
Monocytes Relative: 5.9 % (ref 3.0–12.0)
NEUTROS ABS: 2.9 10*3/uL (ref 1.4–7.7)
Neutrophils Relative %: 66.1 % (ref 43.0–77.0)
PLATELETS: 197 10*3/uL (ref 150.0–400.0)
RBC: 4.13 Mil/uL (ref 3.87–5.11)
RDW: 14.6 % (ref 11.5–15.5)
WBC: 4.4 10*3/uL (ref 4.0–10.5)

## 2016-01-13 LAB — LIPID PANEL
CHOLESTEROL: 162 mg/dL (ref 0–200)
HDL: 45 mg/dL (ref >39.00)
LDL CALC: 96 mg/dL (ref 0–99)
NonHDL: 116.98
TRIGLYCERIDES: 104 mg/dL (ref 0.0–149.0)
Total CHOL/HDL Ratio: 4
VLDL: 20.8 mg/dL (ref 0.0–40.0)

## 2016-01-13 LAB — TSH: TSH: 0.79 u[IU]/mL (ref 0.35–4.50)

## 2016-01-13 LAB — HEMOGLOBIN A1C: Hgb A1c MFr Bld: 5.9 % (ref 4.6–6.5)

## 2016-01-13 NOTE — Assessment & Plan Note (Signed)
Controlled on prevacid.

## 2016-01-13 NOTE — Patient Instructions (Signed)
Probiotics - align, culturelle or florastor

## 2016-01-13 NOTE — Assessment & Plan Note (Signed)
Has persistent sob with exertion.  Given increased irregularity noted on heart exam, EKG obtained and revealed SR with frequent PVCs (trigeminy).  Also had TWI laterally (v4-v6).  She has underlying lung issues. Just saw Dr Alva Garnet.  Felt stable.  Continue inhaler regimen as he recommended.  Given EKG changes and symptoms, will have cardiology reevaluate.  Pt agrees.

## 2016-01-13 NOTE — Assessment & Plan Note (Signed)
Blood pressure as outlined.  She is concerned about it being lower recently on outside checks  Check today looks good.  Not orthostatic on exam.  Will continue current medications for now.  Recheck metabolic panel.  Address increased diarrhea.

## 2016-01-13 NOTE — Assessment & Plan Note (Signed)
Followed by Dr Alva Garnet.  See note.

## 2016-01-13 NOTE — Assessment & Plan Note (Signed)
Is s/p rubber band ligation.  Still some issues as outlined.  Has seen Dr Tamala Julian.

## 2016-01-13 NOTE — Progress Notes (Signed)
Pre visit review using our clinic review tool, if applicable. No additional management support is needed unless otherwise documented below in the visit note. 

## 2016-01-13 NOTE — Assessment & Plan Note (Signed)
Low cholesterol diet and exercise. Follow lipid panel.  Check today.  ?

## 2016-01-13 NOTE — Assessment & Plan Note (Signed)
Persistent.  Check stool studies.  Take probiotic as directed.

## 2016-01-13 NOTE — Assessment & Plan Note (Signed)
Has had extensive w/up.  Saw Dr Ma Hillock.  Recheck cbc.

## 2016-01-13 NOTE — Progress Notes (Signed)
Patient ID: Anita Ferrell, female   DOB: April 13, 1933, 82 y.o.   MRN: [id number]   Subjective:    Patient ID: Anita Ferrell, female    DOB: 10-27-1932, 82 y.o.   MRN: [id number]  HPI  Patient here for a scheduled follow up.  Has COPD.  Seeing Dr Alva Garnet now.  She is on symbicort now.  Trial off spiriva.  Still with sob with exertion.  Still with cough.  Brought in blood pressure readings.  Had some lower readings - 95-110/60-70s.  She also reports persistent diarrhea.  Will have 2-3 loose stools per day.  States is like water.  States still has issues with hemorrhoids.  Some bleeding occasionally.  She started some fiber.  Has helped some.  Still with loose stool.  Reports no recent abx usage.     Past Medical History:  Diagnosis Date  . Allergy   . Asthma   . COPD (chronic obstructive pulmonary disease) ([place])   . GERD (gastroesophageal reflux disease)   . Hypercholesteremia   . Hypertension   . Pulmonary hypertension (H[facility])    Right Ventricular systolic pressure at 60 mm Hg by echo on july of 2011; Right heart cardic catherization revealed pulmonary artery pressusre of 27/10 with a mean of; Ventricular pressure 29/10 with pulmonary capillary wedge at 9  . TB (pulmonary tuberculosis)    treated with INH therapy    Past Surgical History:  Procedure Laterality Date  . CATARACT EXTRACTION Bilateral 2008  . CHOLECYSTECTOMY    . TONSILLECTOMY  1964   Family History  Problem Relation Age of Onset  . Heart disease Mother   . Heart disease Father   . Hypertension Sister   . Heart disease Brother   . Breast cancer Maternal Aunt    Social History   Social History  . Marital status: Widowed    Spouse name: N/A  . Number of children: 3  . Years of education: N/A   Social History Main Topics  . Smoking status: Former Research scientist (life sciences)  . Smokeless tobacco: Never Used     Comment: Quit in 1992  . Alcohol use No  . Drug use: No  . Sexual activity: No   Other Topics Concern  . None    Social History Narrative  . None    Outpatient Encounter Prescriptions as of 01/13/2016  Medication Sig  . albuterol (PROVENTIL HFA;VENTOLIN HFA) 108 (90 Base) MCG/ACT inhaler Inhale 2 puffs into the lungs every 4 (four) hours as needed for wheezing or shortness of breath.  Marland Kitchen aspirin EC 81 MG tablet Take 81 mg by mouth daily.   Marland Kitchen azelastine (ASTELIN) 0.1 % nasal spray Place 1 spray into both nostrils 2 (two) times daily.  . budesonide-formoterol (SYMBICORT) 160-4.5 MCG/ACT inhaler Inhale 2 puffs into the lungs 2 (two) times daily.  . Calcium Carbonate-Vitamin D (CALCIUM 600+D) 600-400 MG-UNIT tablet Take 1 tablet by mouth at bedtime.  . carvedilol (COREG) 3.125 MG tablet Take 3.125 mg by mouth 2 (two) times daily with a meal.   . ferrous sulfate 325 (65 FE) MG tablet Take 325 mg by mouth at bedtime.  . fexofenadine (ALLEGRA) 180 MG tablet Take 180 mg by mouth daily.   Marland Kitchen guaiFENesin (MUCINEX) 600 MG 12 hr tablet Take 600 mg by mouth 2 (two) times daily. Reported on 10/15/2015  . lansoprazole (PREVACID) 30 MG capsule Take 30 mg by mouth daily.   . meclizine (ANTIVERT) 25 MG tablet Take 25 mg by mouth 3 (three)  times daily as needed for dizziness. Reported on 10/15/2015  . Multiple Vitamins-Minerals (CENTRUM SILVER PO) Take by mouth.  . sacubitril-valsartan (ENTRESTO) 24-26 MG Take 1 tablet by mouth 2 (two) times daily.  Marland Kitchen SPIRIVA HANDIHALER 18 MCG inhalation capsule INHALE CONTENTS OF ONE CAPSULE ONCE A DAY AS DIRECTED  . spironolactone (ALDACTONE) 25 MG tablet Take 12.5 mg by mouth daily.    No facility-administered encounter medications on file as of 01/13/2016.     Review of Systems  Constitutional: Negative for appetite change and unexpected weight change.  HENT: Negative for congestion and sinus pressure.   Respiratory: Positive for cough and shortness of breath. Negative for chest tightness.   Cardiovascular: Negative for chest pain, palpitations and leg swelling.  Gastrointestinal:  Positive for diarrhea. Negative for abdominal pain, nausea and vomiting.  Genitourinary: Negative for difficulty urinating and dysuria.  Musculoskeletal: Negative for back pain and joint swelling.  Skin: Negative for color change and rash.  Neurological: Negative for dizziness and headaches.       No significant light headedness.    Psychiatric/Behavioral: Negative for agitation and dysphoric mood.       Objective:     Blood pressure rechecked by me:  120/72  Physical Exam  Constitutional: She appears well-developed and well-nourished. No distress.  HENT:  Nose: Nose normal.  Mouth/Throat: Oropharynx is clear and moist.  Neck: Neck supple. No thyromegaly present.  Cardiovascular: Normal rate.   Appears to be regular with premature beats.    Pulmonary/Chest: Breath sounds normal. No respiratory distress. She has no wheezes.  Abdominal: Soft. Bowel sounds are normal. There is no tenderness.  Musculoskeletal: She exhibits no edema or tenderness.  Lymphadenopathy:    She has no cervical adenopathy.  Skin: No rash noted. No erythema.  Psychiatric: She has a normal mood and affect. Her behavior is normal.    BP 120/68   Pulse (!) 51   Temp 97.6 F (36.4 C) (Oral)   Wt 154 lb (69.9 kg)   SpO2 91%   BMI 25.24 kg/m  Wt Readings from Last 3 Encounters:  01/13/16 154 lb (69.9 kg)  12/26/15 155 lb (70.3 kg)  10/15/15 158 lb 8 oz (71.9 kg)     Lab Results  Component Value Date   WBC 3.9 09/08/2015   HGB 12.1 09/08/2015   HCT 38.0 09/08/2015   PLT 171 09/08/2015   GLUCOSE 111 (H) 12/15/2015   CHOL 179 10/14/2015   TRIG 151.0 (H) 10/14/2015   HDL 38.90 (L) 10/14/2015   LDLDIRECT 127.1 08/25/2012   LDLCALC 110 (H) 10/14/2015   ALT 13 10/14/2015   AST 16 10/14/2015   NA 140 12/15/2015   K 4.3 12/15/2015   CL 107 12/15/2015   CREATININE 1.13 12/15/2015   BUN 13 12/15/2015   CO2 28 12/15/2015   TSH 0.63 05/09/2015   HGBA1C 6.0 10/14/2015    Dg Chest 2 View  Result  Date: 09/08/2015 CLINICAL DATA:  Shortness of breath and chest pain with cough for 3 days EXAM: CHEST  2 VIEW COMPARISON:  Multiple prior studies including computed tomography and radiography including 03/11/2015 chest radiograph FINDINGS: 9 mm nodular density over the right lower lobe unchanged compared to prior chest radiograph. Sub cm calcified granulomas stable. The heart size and vascular pattern are normal. Mild hyperventilatory change consistent with COPD. No consolidation or effusion. IMPRESSION: No radiographically acute abnormalities. Right lower lobe pulmonary nodule has been described on prior CT examinations and appears radiographically similar to prior  chest radiograph. Electronically Signed   By: Skipper Cliche M.D.   On: 09/08/2015 07:47       Assessment & Plan:   Problem List Items Addressed This Visit    Anemia    Has had extensive w/up.  Saw Dr Ma Hillock.  Recheck cbc.       CKD (chronic kidney disease) stage 3, GFR 30-59 ml/min    Last creatinine check improved with adjustments in her medication.  Recheck today.  Follow.       COPD with hypoxia ([place])    Followed by Dr Alva Garnet.  See note.       Diarrhea    Persistent.  Check stool studies.  Take probiotic as directed.        Relevant Orders   Stool Culture   Ova and parasite examination   Clostridium Difficile by PCR   Essential hypertension    Blood pressure as outlined.  She is concerned about it being lower recently on outside checks  Check today looks good.  Not orthostatic on exam.  Will continue current medications for now.  Recheck metabolic panel.  Address increased diarrhea.        Relevant Orders   TSH   Basic metabolic panel   GERD (gastroesophageal reflux disease)    Controlled on prevacid.        Hemorrhoids    Is s/p rubber band ligation.  Still some issues as outlined.  Has seen Dr Tamala Julian.        Hypercholesterolemia    Low cholesterol diet and exercise.  Follow lipid panel.  Check today.         Relevant Orders   Lipid panel   Hepatic function panel   Hyperglycemia    Low carb diet and exercise.  Follow met b and a1c.       Relevant Orders   CBC with Differential/Platelet   Hemoglobin A1c   Shortness of breath    Has persistent sob with exertion.  Given increased irregularity noted on heart exam, EKG obtained and revealed SR with frequent PVCs (trigeminy).  Also had TWI laterally (v4-v6).  She has underlying lung issues. Just saw Dr Alva Garnet.  Felt stable.  Continue inhaler regimen as he recommended.  Given EKG changes and symptoms, will have cardiology reevaluate.  Pt agrees.        Relevant Orders   Ambulatory referral to Cardiology    Other Visit Diagnoses    Irregular heart rhythm    -  Primary   Relevant Orders   EKG 12-Lead (Completed)   Ambulatory referral to Cardiology   Encounter for immunization       Relevant Orders   Flu vaccine HIGH DOSE PF (Completed)   EKG 12-Lead (Completed)   CBC with Differential/Platelet   Lipid panel   TSH   Hepatic function panel   Basic metabolic panel   Hemoglobin A1c   Stool Culture   Ova and parasite examination   Clostridium Difficile by PCR       [name], MD

## 2016-01-13 NOTE — Assessment & Plan Note (Signed)
Last creatinine check improved with adjustments in her medication.  Recheck today.  Follow.

## 2016-01-13 NOTE — Assessment & Plan Note (Signed)
Low carb diet and exercise.  Follow met b and a1c.  

## 2016-01-14 ENCOUNTER — Other Ambulatory Visit: Payer: Medicare Other

## 2016-01-14 DIAGNOSIS — R197 Diarrhea, unspecified: Secondary | ICD-10-CM | POA: Diagnosis not present

## 2016-01-14 DIAGNOSIS — Z23 Encounter for immunization: Secondary | ICD-10-CM | POA: Diagnosis not present

## 2016-01-15 ENCOUNTER — Other Ambulatory Visit: Payer: Self-pay | Admitting: Internal Medicine

## 2016-01-15 DIAGNOSIS — D649 Anemia, unspecified: Secondary | ICD-10-CM

## 2016-01-15 LAB — CLOSTRIDIUM DIFFICILE BY PCR: CDIFFPCR: NOT DETECTED

## 2016-01-15 LAB — OVA AND PARASITE EXAMINATION: OP: NONE SEEN

## 2016-01-18 LAB — STOOL CULTURE

## 2016-01-19 ENCOUNTER — Telehealth: Payer: Self-pay | Admitting: Internal Medicine

## 2016-01-19 NOTE — Telephone Encounter (Signed)
Pt called requesting lab results. Thank you!  Call pt @ [phone]

## 2016-02-02 ENCOUNTER — Other Ambulatory Visit (INDEPENDENT_AMBULATORY_CARE_PROVIDER_SITE_OTHER): Payer: Medicare Other

## 2016-02-02 DIAGNOSIS — D649 Anemia, unspecified: Secondary | ICD-10-CM

## 2016-02-02 LAB — CBC WITH DIFFERENTIAL/PLATELET
BASOS ABS: 0 10*3/uL (ref 0.0–0.1)
Basophils Relative: 0.5 % (ref 0.0–3.0)
EOS PCT: 7.1 % — AB (ref 0.0–5.0)
Eosinophils Absolute: 0.2 10*3/uL (ref 0.0–0.7)
HEMATOCRIT: 33.8 % — AB (ref 36.0–46.0)
Hemoglobin: 11 g/dL — ABNORMAL LOW (ref 12.0–15.0)
LYMPHS ABS: 1.1 10*3/uL (ref 0.7–4.0)
LYMPHS PCT: 32.3 % (ref 12.0–46.0)
MCHC: 32.7 g/dL (ref 30.0–36.0)
MCV: 83.8 fl (ref 78.0–100.0)
MONOS PCT: 7.6 % (ref 3.0–12.0)
Monocytes Absolute: 0.3 10*3/uL (ref 0.1–1.0)
NEUTROS ABS: 1.8 10*3/uL (ref 1.4–7.7)
NEUTROS PCT: 52.5 % (ref 43.0–77.0)
PLATELETS: 186 10*3/uL (ref 150.0–400.0)
RBC: 4.03 Mil/uL (ref 3.87–5.11)
RDW: 14.4 % (ref 11.5–15.5)
WBC: 3.5 10*3/uL — ABNORMAL LOW (ref 4.0–10.5)

## 2016-02-02 LAB — IBC PANEL
Iron: 43 ug/dL (ref 42–145)
SATURATION RATIOS: 15.9 % — AB (ref 20.0–50.0)
TRANSFERRIN: 193 mg/dL — AB (ref 212.0–360.0)

## 2016-02-02 LAB — VITAMIN B12: Vitamin B-12: 422 pg/mL (ref 211–911)

## 2016-02-02 LAB — FERRITIN: Ferritin: 42.9 ng/mL (ref 10.0–291.0)

## 2016-02-03 ENCOUNTER — Other Ambulatory Visit: Payer: Self-pay | Admitting: Internal Medicine

## 2016-02-03 DIAGNOSIS — D509 Iron deficiency anemia, unspecified: Secondary | ICD-10-CM

## 2016-02-03 NOTE — Progress Notes (Signed)
Orders placed for f/u labs.  

## 2016-02-04 ENCOUNTER — Telehealth: Payer: Self-pay

## 2016-02-04 NOTE — Telephone Encounter (Signed)
-----   Message from [name], MD sent at 02/04/2016  4:47 AM EDT ----- Reviewed.  Noted.  Please ask Callie (lab) to f/u on the stool study results.  Let her know we have not received.  Thanks

## 2016-02-04 NOTE — Telephone Encounter (Signed)
Please notify pt that her stool tests (cultures, etc) - negative.  If persistent symptoms, refer to GI for evaluation.  Let me know and I will place the order.

## 2016-02-04 NOTE — Telephone Encounter (Signed)
Please advise 

## 2016-02-04 NOTE — Telephone Encounter (Signed)
I have printed the results and will place on your desk.

## 2016-02-04 NOTE — Telephone Encounter (Signed)
Patient notified and states she is not having any more symptoms

## 2016-02-24 ENCOUNTER — Ambulatory Visit (INDEPENDENT_AMBULATORY_CARE_PROVIDER_SITE_OTHER): Payer: Medicare Other | Admitting: Internal Medicine

## 2016-02-24 ENCOUNTER — Encounter: Payer: Self-pay | Admitting: Internal Medicine

## 2016-02-24 VITALS — BP 102/62 | HR 90 | Temp 97.7°F | Ht 66.0 in | Wt 154.4 lb

## 2016-02-24 DIAGNOSIS — J449 Chronic obstructive pulmonary disease, unspecified: Secondary | ICD-10-CM

## 2016-02-24 DIAGNOSIS — K2270 Barrett's esophagus without dysplasia: Secondary | ICD-10-CM

## 2016-02-24 DIAGNOSIS — D649 Anemia, unspecified: Secondary | ICD-10-CM

## 2016-02-24 DIAGNOSIS — R739 Hyperglycemia, unspecified: Secondary | ICD-10-CM | POA: Diagnosis not present

## 2016-02-24 DIAGNOSIS — E7800 Pure hypercholesterolemia, unspecified: Secondary | ICD-10-CM

## 2016-02-24 DIAGNOSIS — K2100 Gastro-esophageal reflux disease with esophagitis, without bleeding: Secondary | ICD-10-CM

## 2016-02-24 DIAGNOSIS — R0602 Shortness of breath: Secondary | ICD-10-CM

## 2016-02-24 DIAGNOSIS — N183 Chronic kidney disease, stage 3 unspecified: Secondary | ICD-10-CM

## 2016-02-24 DIAGNOSIS — R0902 Hypoxemia: Secondary | ICD-10-CM

## 2016-02-24 DIAGNOSIS — R05 Cough: Secondary | ICD-10-CM

## 2016-02-24 DIAGNOSIS — I10 Essential (primary) hypertension: Secondary | ICD-10-CM

## 2016-02-24 DIAGNOSIS — R059 Cough, unspecified: Secondary | ICD-10-CM

## 2016-02-24 NOTE — Progress Notes (Signed)
Pre visit review using our clinic review tool, if applicable. No additional management support is needed unless otherwise documented below in the visit note. 

## 2016-02-24 NOTE — Progress Notes (Signed)
Patient ID: Anita Ferrell, female   DOB: 08-Jun-1932, 82 y.o.   MRN: [id number]   Subjective:    Patient ID: Anita Ferrell, female    DOB: [birth year], 82 y.o.   MRN: [id number]  HPI  Patient here for a scheduled follow up.  She reports over the last two weeks, she has been having increased cough and some congestion.  On spiriva now.  Feels this works better.  Has been eating and drinking.  No nausea or vomiting.  Bowels stable.  No abdominal pain or cramping. Bowels stable.  No chest pain.  Blood pressures attached. No elevated blood pressures.     Past Medical History:  Diagnosis Date  . Allergy   . Asthma   . COPD (chronic obstructive pulmonary disease) ([facility])   . GERD (gastroesophageal reflux disease)   . Hypercholesteremia   . Hypertension   . Pulmonary hypertension    Right Ventricular systolic pressure at 60 mm Hg by echo on july of 2011; Right heart cardic catherization revealed pulmonary artery pressusre of 27/10 with a mean of; Ventricular pressure 29/10 with pulmonary capillary wedge at 9  . TB (pulmonary tuberculosis)    treated with INH therapy    Past Surgical History:  Procedure Laterality Date  . CATARACT EXTRACTION Bilateral 2008  . CHOLECYSTECTOMY    . TONSILLECTOMY  1964   Family History  Problem Relation Age of Onset  . Heart disease Mother   . Heart disease Father   . Hypertension Sister   . Heart disease Brother   . Breast cancer Maternal Aunt    Social History   Social History  . Marital status: Widowed    Spouse name: N/A  . Number of children: 3  . Years of education: N/A   Social History Main Topics  . Smoking status: Former Games developer  . Smokeless tobacco: Never Used     Comment: Quit in 1992  . Alcohol use No  . Drug use: No  . Sexual activity: No   Other Topics Concern  . None   Social History Narrative  . None    Outpatient Encounter Prescriptions as of 02/24/2016  Medication Sig  . albuterol (PROVENTIL HFA;VENTOLIN HFA) 108  (90 Base) MCG/ACT inhaler Inhale 2 puffs into the lungs every 4 (four) hours as needed for wheezing or shortness of breath.  Marland Kitchen aspirin EC 81 MG tablet Take 81 mg by mouth daily.   Marland Kitchen azelastine (ASTELIN) 0.1 % nasal spray Place 1 spray into both nostrils 2 (two) times daily.  . Calcium Carbonate-Vitamin D (CALCIUM 600+D) 600-400 MG-UNIT tablet Take 1 tablet by mouth at bedtime.  . carvedilol (COREG) 3.125 MG tablet Take 6.25 mg by mouth 2 (two) times daily with a meal.   . ferrous sulfate 325 (65 FE) MG tablet Take 325 mg by mouth 2 (two) times daily with a meal.   . fexofenadine (ALLEGRA) 180 MG tablet Take 180 mg by mouth daily.   Marland Kitchen guaiFENesin (MUCINEX) 600 MG 12 hr tablet Take 600 mg by mouth 2 (two) times daily. Reported on 10/15/2015  . lansoprazole (PREVACID) 30 MG capsule Take 30 mg by mouth daily.   . meclizine (ANTIVERT) 25 MG tablet Take 25 mg by mouth 3 (three) times daily as needed for dizziness. Reported on 10/15/2015  . Multiple Vitamins-Minerals (CENTRUM SILVER PO) Take by mouth.  . sacubitril-valsartan (ENTRESTO) 24-26 MG Take 1 tablet by mouth 2 (two) times daily.  Marland Kitchen SPIRIVA HANDIHALER 18 MCG inhalation capsule INHALE  CONTENTS OF ONE CAPSULE ONCE A DAY AS DIRECTED  . spironolactone (ALDACTONE) 25 MG tablet Take 12.5 mg by mouth daily.   . [DISCONTINUED] budesonide-formoterol (SYMBICORT) 160-4.5 MCG/ACT inhaler Inhale 2 puffs into the lungs 2 (two) times daily.   No facility-administered encounter medications on file as of 02/24/2016.     Review of Systems  Constitutional: Negative for appetite change and unexpected weight change.  HENT: Positive for congestion. Negative for sinus pressure.   Respiratory: Positive for cough. Negative for chest tightness and wheezing.        Cough has improved.   Cardiovascular: Negative for chest pain, palpitations and leg swelling.  Gastrointestinal: Negative for abdominal pain, diarrhea, nausea and vomiting.  Genitourinary: Negative for  difficulty urinating and dysuria.  Musculoskeletal: Negative for back pain and joint swelling.  Skin: Negative for color change and rash.  Neurological: Negative for dizziness, light-headedness and headaches.  Psychiatric/Behavioral: Negative for agitation and dysphoric mood.       Objective:    Physical Exam  Constitutional: She appears well-developed and well-nourished. No distress.  HENT:  Nose: Nose normal.  Mouth/Throat: Oropharynx is clear and moist.  Neck: Neck supple. No thyromegaly present.  Cardiovascular: Normal rate and regular rhythm.   Pulmonary/Chest: Breath sounds normal. No respiratory distress. She has no wheezes.  Abdominal: Soft. Bowel sounds are normal. There is no tenderness.  Musculoskeletal: She exhibits no edema or tenderness.  Lymphadenopathy:    She has no cervical adenopathy.  Skin: No rash noted. No erythema.  Psychiatric: She has a normal mood and affect. Her behavior is normal.    BP 102/62   Pulse 90   Temp 97.7 F (36.5 C) (Oral)   Ht '5\' 6"'$  (1.676 m)   Wt 154 lb 6.4 oz (70 kg)   SpO2 98%   BMI 24.92 kg/m  Wt Readings from Last 3 Encounters:  02/24/16 154 lb 6.4 oz (70 kg)  01/13/16 154 lb (69.9 kg)  12/26/15 155 lb (70.3 kg)     Lab Results  Component Value Date   WBC 3.5 (L) 02/02/2016   HGB 11.0 (L) 02/02/2016   HCT 33.8 (L) 02/02/2016   PLT 186.0 02/02/2016   GLUCOSE 105 (H) 01/13/2016   CHOL 162 01/13/2016   TRIG 104.0 01/13/2016   HDL 45.00 01/13/2016   LDLDIRECT 127.1 08/25/2012   LDLCALC 96 01/13/2016   ALT 12 01/13/2016   AST 18 01/13/2016   NA 143 01/13/2016   K 4.7 01/13/2016   CL 107 01/13/2016   CREATININE 1.15 01/13/2016   BUN 16 01/13/2016   CO2 32 01/13/2016   TSH 0.79 01/13/2016   HGBA1C 5.9 01/13/2016    Dg Chest 2 View  Result Date: 09/08/2015 CLINICAL DATA:  Shortness of breath and chest pain with cough for 3 days EXAM: CHEST  2 VIEW COMPARISON:  Multiple prior studies including computed tomography  and radiography including 03/11/2015 chest radiograph FINDINGS: 9 mm nodular density over the right lower lobe unchanged compared to prior chest radiograph. Sub cm calcified granulomas stable. The heart size and vascular pattern are normal. Mild hyperventilatory change consistent with COPD. No consolidation or effusion. IMPRESSION: No radiographically acute abnormalities. Right lower lobe pulmonary nodule has been described on prior CT examinations and appears radiographically similar to prior chest radiograph. Electronically Signed   By: Skipper Cliche M.D.   On: 09/08/2015 07:47       Assessment & Plan:   Problem List Items Addressed This Visit    Anemia  Has had extensive w/up.  Saw Dr Ma Hillock.  Follow cbc.  Last hgb 11.        Barrett's esophagus    Symptoms controlled.  Followed by GI - Dr Gustavo Lah.        CKD (chronic kidney disease) stage 3, GFR 30-59 ml/min    Creatinine improved with adjustments in medication.  Follow.       COPD with hypoxia ([place])    Followed by Dr Alva Garnet.        Essential hypertension    Blood pressure under good control.  Continue same medication regimen.  Follow pressures.  Follow metabolic panel.         GERD (gastroesophageal reflux disease)    Controlled on prevacid.        Hypercholesterolemia    Low cholesterol diet and exercise.  Follow lipid panel.        Hyperglycemia    Low carb diet and exercise.  Follow met b and a1c.       Shortness of breath    Has moderate COPD.  Breathing overall is stable.  Sees pulmonary.  Recent cough.  Better now.  On spiriva.  Follow.        Other Visit Diagnoses    Cough    -  Primary   Increased cough and congestion as outlined.  improved.  robitussin.  continue inhalers.  follow.         [name], MD

## 2016-02-29 ENCOUNTER — Encounter: Payer: Self-pay | Admitting: Internal Medicine

## 2016-02-29 NOTE — Assessment & Plan Note (Signed)
Symptoms controlled.  Followed by GI - Dr Gustavo Lah.

## 2016-02-29 NOTE — Assessment & Plan Note (Signed)
Blood pressure under good control.  Continue same medication regimen.  Follow pressures.  Follow metabolic panel.   

## 2016-02-29 NOTE — Assessment & Plan Note (Signed)
Low carb diet and exercise.  Follow met b and a1c.  

## 2016-02-29 NOTE — Assessment & Plan Note (Signed)
Controlled on prevacid.

## 2016-02-29 NOTE — Assessment & Plan Note (Signed)
Has had extensive w/up.  Saw Dr Ma Hillock.  Follow cbc.  Last hgb 11.

## 2016-02-29 NOTE — Assessment & Plan Note (Signed)
Has moderate COPD.  Breathing overall is stable.  Sees pulmonary.  Recent cough.  Better now.  On spiriva.  Follow.

## 2016-02-29 NOTE — Assessment & Plan Note (Signed)
Creatinine improved with adjustments in medication.  Follow.

## 2016-02-29 NOTE — Assessment & Plan Note (Signed)
Followed by Dr Alva Garnet.

## 2016-02-29 NOTE — Assessment & Plan Note (Signed)
Low cholesterol diet and exercise.  Follow lipid panel.   

## 2016-03-02 ENCOUNTER — Other Ambulatory Visit (INDEPENDENT_AMBULATORY_CARE_PROVIDER_SITE_OTHER): Payer: Medicare Other

## 2016-03-02 DIAGNOSIS — D509 Iron deficiency anemia, unspecified: Secondary | ICD-10-CM

## 2016-03-02 LAB — CBC WITH DIFFERENTIAL/PLATELET
BASOS PCT: 0.4 % (ref 0.0–3.0)
Basophils Absolute: 0 10*3/uL (ref 0.0–0.1)
EOS PCT: 7.5 % — AB (ref 0.0–5.0)
Eosinophils Absolute: 0.3 10*3/uL (ref 0.0–0.7)
HCT: 34.4 % — ABNORMAL LOW (ref 36.0–46.0)
HEMOGLOBIN: 11.1 g/dL — AB (ref 12.0–15.0)
LYMPHS ABS: 1.4 10*3/uL (ref 0.7–4.0)
Lymphocytes Relative: 34.9 % (ref 12.0–46.0)
MCHC: 32.5 g/dL (ref 30.0–36.0)
MCV: 83.7 fl (ref 78.0–100.0)
MONO ABS: 0.3 10*3/uL (ref 0.1–1.0)
Monocytes Relative: 8.1 % (ref 3.0–12.0)
NEUTROS ABS: 1.9 10*3/uL (ref 1.4–7.7)
Neutrophils Relative %: 49.1 % (ref 43.0–77.0)
Platelets: 185 10*3/uL (ref 150.0–400.0)
RBC: 4.11 Mil/uL (ref 3.87–5.11)
RDW: 14.3 % (ref 11.5–15.5)
WBC: 3.9 10*3/uL — ABNORMAL LOW (ref 4.0–10.5)

## 2016-03-02 LAB — FERRITIN: Ferritin: 48 ng/mL (ref 10.0–291.0)

## 2016-03-03 ENCOUNTER — Encounter: Payer: Self-pay | Admitting: Internal Medicine

## 2016-03-09 DIAGNOSIS — R531 Weakness: Secondary | ICD-10-CM | POA: Diagnosis not present

## 2016-03-09 DIAGNOSIS — I10 Essential (primary) hypertension: Secondary | ICD-10-CM | POA: Diagnosis not present

## 2016-03-09 DIAGNOSIS — E782 Mixed hyperlipidemia: Secondary | ICD-10-CM | POA: Diagnosis not present

## 2016-03-09 DIAGNOSIS — I509 Heart failure, unspecified: Secondary | ICD-10-CM | POA: Diagnosis not present

## 2016-03-09 DIAGNOSIS — R55 Syncope and collapse: Secondary | ICD-10-CM | POA: Diagnosis not present

## 2016-03-09 DIAGNOSIS — R001 Bradycardia, unspecified: Secondary | ICD-10-CM | POA: Diagnosis not present

## 2016-03-17 DIAGNOSIS — I493 Ventricular premature depolarization: Secondary | ICD-10-CM | POA: Diagnosis not present

## 2016-03-17 DIAGNOSIS — R0602 Shortness of breath: Secondary | ICD-10-CM | POA: Diagnosis not present

## 2016-03-17 DIAGNOSIS — I10 Essential (primary) hypertension: Secondary | ICD-10-CM | POA: Diagnosis not present

## 2016-03-17 DIAGNOSIS — I340 Nonrheumatic mitral (valve) insufficiency: Secondary | ICD-10-CM | POA: Diagnosis not present

## 2016-03-17 DIAGNOSIS — E785 Hyperlipidemia, unspecified: Secondary | ICD-10-CM | POA: Diagnosis not present

## 2016-03-18 DIAGNOSIS — I493 Ventricular premature depolarization: Secondary | ICD-10-CM | POA: Diagnosis not present

## 2016-03-18 DIAGNOSIS — E785 Hyperlipidemia, unspecified: Secondary | ICD-10-CM | POA: Diagnosis not present

## 2016-03-18 DIAGNOSIS — I10 Essential (primary) hypertension: Secondary | ICD-10-CM | POA: Diagnosis not present

## 2016-03-18 DIAGNOSIS — I340 Nonrheumatic mitral (valve) insufficiency: Secondary | ICD-10-CM | POA: Diagnosis not present

## 2016-03-18 DIAGNOSIS — R0602 Shortness of breath: Secondary | ICD-10-CM | POA: Diagnosis not present

## 2016-03-22 ENCOUNTER — Telehealth: Payer: Self-pay | Admitting: Internal Medicine

## 2016-03-22 NOTE — Telephone Encounter (Signed)
Pt declined to get the AWV. Thank you! °

## 2016-03-25 DIAGNOSIS — I509 Heart failure, unspecified: Secondary | ICD-10-CM | POA: Diagnosis not present

## 2016-03-25 DIAGNOSIS — I10 Essential (primary) hypertension: Secondary | ICD-10-CM | POA: Diagnosis not present

## 2016-03-25 DIAGNOSIS — I340 Nonrheumatic mitral (valve) insufficiency: Secondary | ICD-10-CM | POA: Diagnosis not present

## 2016-03-25 DIAGNOSIS — E782 Mixed hyperlipidemia: Secondary | ICD-10-CM | POA: Diagnosis not present

## 2016-03-25 DIAGNOSIS — R609 Edema, unspecified: Secondary | ICD-10-CM | POA: Diagnosis not present

## 2016-03-25 DIAGNOSIS — R0602 Shortness of breath: Secondary | ICD-10-CM | POA: Diagnosis not present

## 2016-03-31 DIAGNOSIS — Z0189 Encounter for other specified special examinations: Secondary | ICD-10-CM | POA: Diagnosis not present

## 2016-04-01 DIAGNOSIS — I340 Nonrheumatic mitral (valve) insufficiency: Secondary | ICD-10-CM | POA: Diagnosis not present

## 2016-04-01 DIAGNOSIS — I10 Essential (primary) hypertension: Secondary | ICD-10-CM | POA: Diagnosis not present

## 2016-04-01 DIAGNOSIS — E782 Mixed hyperlipidemia: Secondary | ICD-10-CM | POA: Diagnosis not present

## 2016-05-13 ENCOUNTER — Ambulatory Visit: Payer: PRIVATE HEALTH INSURANCE | Admitting: Pulmonary Disease

## 2016-05-14 DIAGNOSIS — I10 Essential (primary) hypertension: Secondary | ICD-10-CM | POA: Diagnosis not present

## 2016-05-14 DIAGNOSIS — I340 Nonrheumatic mitral (valve) insufficiency: Secondary | ICD-10-CM | POA: Diagnosis not present

## 2016-05-14 DIAGNOSIS — R0602 Shortness of breath: Secondary | ICD-10-CM | POA: Diagnosis not present

## 2016-05-14 DIAGNOSIS — E782 Mixed hyperlipidemia: Secondary | ICD-10-CM | POA: Diagnosis not present

## 2016-05-14 DIAGNOSIS — I509 Heart failure, unspecified: Secondary | ICD-10-CM | POA: Diagnosis not present

## 2016-05-14 DIAGNOSIS — I420 Dilated cardiomyopathy: Secondary | ICD-10-CM | POA: Diagnosis not present

## 2016-05-31 ENCOUNTER — Ambulatory Visit: Payer: PRIVATE HEALTH INSURANCE | Admitting: Internal Medicine

## 2016-06-04 ENCOUNTER — Encounter: Payer: Self-pay | Admitting: Pulmonary Disease

## 2016-06-04 ENCOUNTER — Ambulatory Visit (INDEPENDENT_AMBULATORY_CARE_PROVIDER_SITE_OTHER): Payer: Medicare Other | Admitting: Pulmonary Disease

## 2016-06-04 VITALS — BP 130/84 | HR 52 | Wt 148.0 lb

## 2016-06-04 DIAGNOSIS — R001 Bradycardia, unspecified: Secondary | ICD-10-CM

## 2016-06-04 DIAGNOSIS — J449 Chronic obstructive pulmonary disease, unspecified: Secondary | ICD-10-CM | POA: Diagnosis not present

## 2016-06-04 DIAGNOSIS — R0609 Other forms of dyspnea: Secondary | ICD-10-CM | POA: Diagnosis not present

## 2016-06-04 NOTE — Patient Instructions (Addendum)
Continue Spiriva Continue albuterol as needed Follow up in 6 months

## 2016-06-05 NOTE — Progress Notes (Signed)
PULMONARY OFFICE FOLLOW UP NOTE  Requesting MD/Service: Self Date of initial consultation: 06/20/15 Reason for consultation: COPD, lung nodule  Initial office HPI (06/20/15):  74 F with history of COPD previously followed by Dr [name] (last seen by him in Aug 2016) and seen by Dr Stevenson Clinch in consultation in hospital in Nov 2016 when she was admitted with AECOPD. At that time, a CTA chest was performed revealing a new RLL nodule. Presently, she is at her baseline class III dyspnea. She has intermittent cough which is predominantly nonproductive. She has a documented history of TB which, by her description, was latent TB treated with INH for 9 months. Denies CP, fever, purulent sputum, hemoptysis, LE edema and calf tenderness. She notes that she was previously on [facility] and was discharged in Nov on Advair. She indicates that she prefers the [facility] Initial IMP/PLAN: COPD/emphysema, Lung nodule - incidental finding on CTA chest, H/O positive PPD - S/P 9 months INH. Stopped theophylline, Changed Advair to [facility] inhaler - 2 puffs twice a day, Continued Spiriva, Continued prednisone @ 5 mg daily with plan to try to reduce this slowly over time, Repeat CT chest, Follow up in 6 weeks with PFTs  CT chest 07/29/15: 1. Significant decrease in size of the previously demonstrated right lower lobe nodule, compatible with a benign process. 2. No significant change in multiple additional sub cm nodules and calcified granulomata in both lungs, compatible with a benign process. 3. Mild changes of COPD and chronic bronchitis. 4. Resolved infection in the lingula and left lower lobe  PFTs 07/29/15: moderate obstruction, normal lung volumes, mild reduction DLCO  ROV 08/04/15: No new complaints. Here to review results of PFTs and CT chest. Continue Dulera and Spiriva, change Pred to 5 mg qod for one month, then stop. F/U 6 wks  ED 09/08/15: increased dyspnea. CXR without acute findings. BNP elevated. TTE performed and  revealed LVEF 35-40%. Referred to cardiology (Dr Humphrey Rolls) who initiated therapy for CHF. Nuclear scan and CT coronary angiogram reportedly without evidence of ischemia.   ROV 09/16/15: Off prednisone without any worsening of symptoms ROV 12/26/15: No new complaints or problems. Overall improved. Never uses rescue MDI. Trial off Spiriva ROV 06/04/16: No new pulmonary problems. Has stopped Symbicort without worsening of her respiratory symptoms. Remains on Symbicort. No changes made  SUBJ: No new pulmonary problems. Has stopped Symbicort without worsening of her respiratory symptoms. Remains on Symbicort. Using albuterol rescue inhaler approx 2x per week. Denes CP, fever, purulent sputum, hemoptysis, LE edema and calf tenderness  ONJ: Vitals:   06/04/16 0927  BP: 130/84  Pulse: (!) 52  SpO2: 95%  Weight: 148 lb (67.1 kg)   EXAM:  Gen: No overt distress HEENT: WNL Neck: Supple without, JVD Lungs: breath sounds mildly diminished, No wheezes Cardiovascular: Bradycardic, reg, no murmur Abdomen: Soft, nontender, normal BS Ext: without clubbing, cyanosis, edema  DATA:   BMP Latest Ref Rng & Units 01/13/2016 12/15/2015 11/14/2015  Glucose 70 - 99 mg/dL 105(H) 111(H) 113(H)  BUN 6 - 23 mg/dL 16 13 15   Creatinine 0.40 - 1.20 mg/dL 1.15 1.13 1.26(H)  Sodium 135 - 145 mEq/L 143 140 140  Potassium 3.5 - 5.1 mEq/L 4.7 4.3 4.7  Chloride 96 - 112 mEq/L 107 107 107  CO2 19 - 32 mEq/L 32 28 27  Calcium 8.4 - 10.5 mg/dL 9.6 9.2 9.8    CBC Latest Ref Rng & Units 03/02/2016 02/02/2016 01/13/2016  WBC 4.0 - 10.5 K/uL 3.9(L) 3.5(L) 4.4  Hemoglobin 12.0 - 15.0 g/dL 11.1(L) 11.0(L) 11.3(L)  Hematocrit 36.0 - 46.0 % 34.4(L) 33.8(L) 34.5(L)  Platelets 150.0 - 400.0 K/uL 185.0 186.0 197.0    CXR: NNF   IMPRESSION:   COPD, moderate - mostly fixed obstruction.  Exertional dyspnea - likely has both pulmonary and cardiac components (LVEF 35-40% by echocardiogram 08/2015) Bradycardia - she is on  metoprolol 50 mg BID. I am concerned that too much suppression of HR response to exertion might be contributing to DOE  PLAN:  Cont Spiriva Cont PRN albuterol Suggest to Dr Nicki Reaper and/or her cardiologist that metoprolol dose be reduced ROV in 6 months or as needed   Merton Border, MD PCCM service Mobile [phone] Pager [phone] 06/05/2016

## 2016-06-14 ENCOUNTER — Other Ambulatory Visit: Payer: Self-pay | Admitting: Pulmonary Disease

## 2016-06-17 ENCOUNTER — Encounter: Payer: Self-pay | Admitting: Internal Medicine

## 2016-06-17 ENCOUNTER — Ambulatory Visit (INDEPENDENT_AMBULATORY_CARE_PROVIDER_SITE_OTHER): Payer: Medicare Other | Admitting: Internal Medicine

## 2016-06-17 VITALS — BP 120/72 | HR 48 | Temp 97.3°F | Resp 20 | Ht 66.0 in | Wt 148.2 lb

## 2016-06-17 DIAGNOSIS — D649 Anemia, unspecified: Secondary | ICD-10-CM | POA: Diagnosis not present

## 2016-06-17 DIAGNOSIS — I10 Essential (primary) hypertension: Secondary | ICD-10-CM

## 2016-06-17 DIAGNOSIS — K2270 Barrett's esophagus without dysplasia: Secondary | ICD-10-CM | POA: Diagnosis not present

## 2016-06-17 DIAGNOSIS — Z0000 Encounter for general adult medical examination without abnormal findings: Secondary | ICD-10-CM | POA: Diagnosis not present

## 2016-06-17 DIAGNOSIS — R0602 Shortness of breath: Secondary | ICD-10-CM | POA: Diagnosis not present

## 2016-06-17 DIAGNOSIS — K2100 Gastro-esophageal reflux disease with esophagitis, without bleeding: Secondary | ICD-10-CM

## 2016-06-17 DIAGNOSIS — R0902 Hypoxemia: Secondary | ICD-10-CM

## 2016-06-17 DIAGNOSIS — I340 Nonrheumatic mitral (valve) insufficiency: Secondary | ICD-10-CM | POA: Diagnosis not present

## 2016-06-17 DIAGNOSIS — J449 Chronic obstructive pulmonary disease, unspecified: Secondary | ICD-10-CM

## 2016-06-17 DIAGNOSIS — R739 Hyperglycemia, unspecified: Secondary | ICD-10-CM

## 2016-06-17 DIAGNOSIS — N183 Chronic kidney disease, stage 3 unspecified: Secondary | ICD-10-CM

## 2016-06-17 DIAGNOSIS — E7800 Pure hypercholesterolemia, unspecified: Secondary | ICD-10-CM | POA: Diagnosis not present

## 2016-06-17 DIAGNOSIS — R609 Edema, unspecified: Secondary | ICD-10-CM | POA: Diagnosis not present

## 2016-06-17 DIAGNOSIS — Z1231 Encounter for screening mammogram for malignant neoplasm of breast: Secondary | ICD-10-CM

## 2016-06-17 DIAGNOSIS — I509 Heart failure, unspecified: Secondary | ICD-10-CM | POA: Diagnosis not present

## 2016-06-17 DIAGNOSIS — Z1239 Encounter for other screening for malignant neoplasm of breast: Secondary | ICD-10-CM

## 2016-06-17 LAB — CBC WITH DIFFERENTIAL/PLATELET
BASOS ABS: 0 10*3/uL (ref 0.0–0.1)
Basophils Relative: 0.7 % (ref 0.0–3.0)
EOS ABS: 0.2 10*3/uL (ref 0.0–0.7)
Eosinophils Relative: 4.6 % (ref 0.0–5.0)
HEMATOCRIT: 35.1 % — AB (ref 36.0–46.0)
HEMOGLOBIN: 11.4 g/dL — AB (ref 12.0–15.0)
LYMPHS PCT: 32.2 % (ref 12.0–46.0)
Lymphs Abs: 1.5 10*3/uL (ref 0.7–4.0)
MCHC: 32.5 g/dL (ref 30.0–36.0)
MCV: 85.1 fl (ref 78.0–100.0)
MONO ABS: 0.4 10*3/uL (ref 0.1–1.0)
Monocytes Relative: 8.4 % (ref 3.0–12.0)
NEUTROS ABS: 2.5 10*3/uL (ref 1.4–7.7)
Neutrophils Relative %: 54.1 % (ref 43.0–77.0)
PLATELETS: 197 10*3/uL (ref 150.0–400.0)
RBC: 4.12 Mil/uL (ref 3.87–5.11)
RDW: 15.1 % (ref 11.5–15.5)
WBC: 4.6 10*3/uL (ref 4.0–10.5)

## 2016-06-17 LAB — LIPID PANEL
CHOLESTEROL: 181 mg/dL (ref 0–200)
HDL: 45 mg/dL (ref >39.00)
LDL CALC: 104 mg/dL — AB (ref 0–99)
NONHDL: 136.24
Total CHOL/HDL Ratio: 4
Triglycerides: 161 mg/dL — ABNORMAL HIGH (ref 0.0–149.0)
VLDL: 32.2 mg/dL (ref 0.0–40.0)

## 2016-06-17 LAB — HEMOGLOBIN A1C: Hgb A1c MFr Bld: 6.1 % (ref 4.6–6.5)

## 2016-06-17 LAB — HEPATIC FUNCTION PANEL
ALT: 9 U/L (ref 0–35)
AST: 14 U/L (ref 0–37)
Albumin: 3.9 g/dL (ref 3.5–5.2)
Alkaline Phosphatase: 62 U/L (ref 39–117)
BILIRUBIN DIRECT: 0.1 mg/dL (ref 0.0–0.3)
BILIRUBIN TOTAL: 0.6 mg/dL (ref 0.2–1.2)
Total Protein: 6.1 g/dL (ref 6.0–8.3)

## 2016-06-17 LAB — BASIC METABOLIC PANEL
BUN: 14 mg/dL (ref 6–23)
CHLORIDE: 103 meq/L (ref 96–112)
CO2: 31 meq/L (ref 19–32)
Calcium: 9.8 mg/dL (ref 8.4–10.5)
Creatinine, Ser: 1.28 mg/dL — ABNORMAL HIGH (ref 0.40–1.20)
GFR: 42.42 mL/min — ABNORMAL LOW (ref >60.00)
GLUCOSE: 111 mg/dL — AB (ref 70–99)
Potassium: 4.7 mEq/L (ref 3.5–5.1)
SODIUM: 140 meq/L (ref 135–145)

## 2016-06-17 LAB — TSH: TSH: 1.09 u[IU]/mL (ref 0.35–4.50)

## 2016-06-17 LAB — FERRITIN: FERRITIN: 94.5 ng/mL (ref 10.0–291.0)

## 2016-06-17 NOTE — Progress Notes (Signed)
Pre-visit discussion using our clinic review tool. No additional management support is needed unless otherwise documented below in the visit note.  

## 2016-06-17 NOTE — Progress Notes (Addendum)
Patient ID: Anita Ferrell, female   DOB: 02-16-1933, 83 y.o.   MRN: [id number]   Subjective:    Patient ID: Anita Ferrell, female    DOB: Aug 03, 1932, 83 y.o.   MRN: [id number]  HPI  Patient with past history of hypercholesterolemia, GERD, COPD and hypertension.  She comes in today to follow up on these issues as well as for a complete physical exam.  She has COPD.  Seeing Dr Alva Garnet now.  Last evaluated 06/04/16.  He recommended at that time to continue spiriva and prn albuterol.  She is on a low dose of prednisone.  Recommended f/u in 6 weeks with PFTs.  She states she is scheduled f/u at the end of this month.  She reports that she has noticed that she gets out of breath with increased activity.  Also will start coughing.  Some occasional light headedness.  No acid reflux.  No abdominal pain.  Bowels moving.  She sees Dr Chancy Milroy for her heart.  Has appt 07/12/16.     Past Medical History:  Diagnosis Date  . Allergy   . Asthma   . COPD (chronic obstructive pulmonary disease) ([place])   . GERD (gastroesophageal reflux disease)   . Hypercholesteremia   . Hypertension   . Pulmonary hypertension    Right Ventricular systolic pressure at 60 mm Hg by echo on july of 2011; Right heart cardic catherization revealed pulmonary artery pressusre of 27/10 with a mean of; Ventricular pressure 29/10 with pulmonary capillary wedge at 9  . TB (pulmonary tuberculosis)    treated with INH therapy    Past Surgical History:  Procedure Laterality Date  . CATARACT EXTRACTION Bilateral 2008  . CHOLECYSTECTOMY    . TONSILLECTOMY  1964   Family History  Problem Relation Age of Onset  . Heart disease Mother   . Heart disease Father   . Hypertension Sister   . Heart disease Brother   . Breast cancer Maternal Aunt    Social History   Social History  . Marital status: Widowed    Spouse name: N/A  . Number of children: 3  . Years of education: N/A   Social History Main Topics  . Smoking status:  Former Research scientist (life sciences)  . Smokeless tobacco: Never Used     Comment: Quit in 1992  . Alcohol use No  . Drug use: No  . Sexual activity: No   Other Topics Concern  . None   Social History Narrative  . None    Outpatient Encounter Prescriptions as of 06/17/2016  Medication Sig  . acetaminophen (TYLENOL) 500 MG tablet Take 500 mg by mouth every 6 (six) hours as needed.  Marland Kitchen albuterol (PROVENTIL HFA;VENTOLIN HFA) 108 (90 Base) MCG/ACT inhaler Inhale 2 puffs into the lungs every 4 (four) hours as needed for wheezing or shortness of breath.  Marland Kitchen aspirin EC 81 MG tablet Take 81 mg by mouth daily.   Marland Kitchen azelastine (ASTELIN) 0.1 % nasal spray Place 1 spray into both nostrils 2 (two) times daily.  . Calcium Carbonate-Vitamin D (CALCIUM 600+D) 600-400 MG-UNIT tablet Take 1 tablet by mouth at bedtime.  . ferrous sulfate 325 (65 FE) MG tablet Take 325 mg by mouth 2 (two) times daily with a meal.   . fexofenadine (ALLEGRA) 180 MG tablet Take 180 mg by mouth daily.   . furosemide (LASIX) 40 MG tablet Take 40 mg by mouth every other day.  . lansoprazole (PREVACID) 30 MG capsule Take 30 mg by mouth daily.   Marland Kitchen  Probiotic Product (ALIGN PO) Take by mouth.  . pseudoephedrine-guaifenesin (MUCINEX D) 60-600 MG 12 hr tablet Take 1 tablet by mouth every 12 (twelve) hours.  . sacubitril-valsartan (ENTRESTO) 24-26 MG Take 1 tablet by mouth 2 (two) times daily.  Marland Kitchen SPIRIVA HANDIHALER 18 MCG inhalation capsule INHALE CONTENTS OF ONE CAPSULE ONCE A DAY AS DIRECTED  . spironolactone (ALDACTONE) 25 MG tablet Take 12.5 mg by mouth daily.   . meclizine (ANTIVERT) 25 MG tablet Take 25 mg by mouth 3 (three) times daily as needed for dizziness. Reported on 10/15/2015  . metoprolol (LOPRESSOR) 50 MG tablet Take 50 mg by mouth 2 (two) times daily.  . Multiple Vitamins-Minerals (CENTRUM SILVER PO) Take by mouth.   No facility-administered encounter medications on file as of 06/17/2016.     Review of Systems  Constitutional: Negative for  appetite change and unexpected weight change.  HENT: Negative for congestion and sinus pressure.   Eyes: Negative for pain and visual disturbance.  Respiratory: Positive for cough and shortness of breath. Negative for chest tightness.   Cardiovascular: Negative for palpitations and leg swelling.  Gastrointestinal: Negative for abdominal pain, diarrhea, nausea and vomiting.  Genitourinary: Negative for difficulty urinating and dysuria.  Musculoskeletal: Negative for back pain and joint swelling.  Skin: Negative for color change and rash.  Neurological: Positive for light-headedness. Negative for headaches.  Hematological: Negative for adenopathy. Does not bruise/bleed easily.  Psychiatric/Behavioral: Negative for agitation and dysphoric mood.       Objective:    Physical Exam  Constitutional: She is oriented to person, place, and time. She appears well-developed and well-nourished. No distress.  HENT:  Nose: Nose normal.  Mouth/Throat: Oropharynx is clear and moist.  Eyes: Right eye exhibits no discharge. Left eye exhibits no discharge. No scleral icterus.  Neck: Neck supple. No thyromegaly present.  Cardiovascular: Normal rate and regular rhythm.   Pulmonary/Chest: Breath sounds normal. No accessory muscle usage. No tachypnea. No respiratory distress. She has no decreased breath sounds. She has no wheezes. She has no rhonchi. Right breast exhibits no inverted nipple, no mass, no nipple discharge and no tenderness (no axillary adenopathy). Left breast exhibits no inverted nipple, no mass, no nipple discharge and no tenderness (no axilarry adenopathy).  Abdominal: Soft. Bowel sounds are normal. There is no tenderness.  Musculoskeletal: She exhibits no edema or tenderness.  Lymphadenopathy:    She has no cervical adenopathy.  Neurological: She is alert and oriented to person, place, and time.  Skin: Skin is warm. No rash noted. No erythema.  Psychiatric: She has a normal mood and affect.  Her behavior is normal.    BP 120/72 (BP Location: Left Arm, Patient Position: Sitting, Cuff Size: Large)   Pulse (!) 48   Temp 97.3 F (36.3 C) (Oral)   Resp 20   Ht _0  (1.676 m)   Wt 148 lb 3.2 oz (67.2 kg)   SpO2 95%   BMI 23.92 kg/m  Wt Readings from Last 3 Encounters:  06/17/16 148 lb 3.2 oz (67.2 kg)  06/04/16 148 lb (67.1 kg)  02/24/16 154 lb 6.4 oz (70 kg)     Lab Results  Component Value Date   WBC 4.6 06/17/2016   HGB 11.4 (L) 06/17/2016   HCT 35.1 (L) 06/17/2016   PLT 197.0 06/17/2016   GLUCOSE 111 (H) 06/17/2016   CHOL 181 06/17/2016   TRIG 161.0 (H) 06/17/2016   HDL 45.00 06/17/2016   LDLDIRECT 127.1 08/25/2012   LDLCALC 104 (H) 06/17/2016  ALT 9 06/17/2016   AST 14 06/17/2016   NA 140 06/17/2016   K 4.7 06/17/2016   CL 103 06/17/2016   CREATININE 1.28 (H) 06/17/2016   BUN 14 06/17/2016   CO2 31 06/17/2016   TSH 1.09 06/17/2016   HGBA1C 6.1 06/17/2016    Dg Chest 2 View  Result Date: 09/08/2015 CLINICAL DATA:  Shortness of breath and chest pain with cough for 3 days EXAM: CHEST  2 VIEW COMPARISON:  Multiple prior studies including computed tomography and radiography including 03/11/2015 chest radiograph FINDINGS: 9 mm nodular density over the right lower lobe unchanged compared to prior chest radiograph. Sub cm calcified granulomas stable. The heart size and vascular pattern are normal. Mild hyperventilatory change consistent with COPD. No consolidation or effusion. IMPRESSION: No radiographically acute abnormalities. Right lower lobe pulmonary nodule has been described on prior CT examinations and appears radiographically similar to prior chest radiograph. Electronically Signed   By: Skipper Cliche M.D.   On: 09/08/2015 07:47       Assessment & Plan:   Problem List Items Addressed This Visit    Anemia    Saw Dr Ma Hillock.  Has had extensive w/up.  Follow cbc.        Relevant Orders   CBC with Differential/Platelet (Completed)   Ferritin  (Completed)   Barrett's esophagus    Followed by GI Gustavo Lah).        CKD (chronic kidney disease) stage 3, GFR 30-59 ml/min    Follow kidney function. Recheck met b today.  Avoid antiinflammatories.        COPD with hypoxia ([place])    Followed by Dr Alva Garnet.  Cardiac evaluation planned as outlined.  Keep scheduled appt with Dr Alva Garnet.  Continue inhalers.  Reflux controlled.        Relevant Medications   pseudoephedrine-guaifenesin (MUCINEX D) 60-600 MG 12 hr tablet   Essential hypertension    Blood pressure under good control.  Continue same medication regimen.  Follow pressures.  Follow metabolic panel.        Relevant Orders   TSH (Completed)   Basic metabolic panel (Completed)   GERD (gastroesophageal reflux disease)    Controlled on prevacid.        Relevant Medications   Probiotic Product (ALIGN PO)   Health care maintenance    Physical today 06/17/16.  Colonoscopy 05/07/14.  Mammogram 06/19/15 - Birads I.        Hypercholesterolemia    Not on cholesterol medication.  Last LDL 90s.  Follow.  Recheck lipid panel today.       Relevant Orders   Lipid panel (Completed)   Hepatic function panel (Completed)   Hyperglycemia    Follow met b and a1c.        Relevant Orders   Hemoglobin A1c (Completed)   Shortness of breath    Has moderate copd.  Sees Dr Alva Garnet.  On spiriva.  Uses albuterol prn.  Has f/u planned at the end of this month.  Given her sob with activity, EKG obtained and revealed SR with frequent PVCs (bigeminy).  Given symptoms and EKG findings, her cardiologist was notified and agreed to see the pt today for further cardiac w/up.         Other Visit Diagnoses    SOB (shortness of breath)    -  Primary   Relevant Orders   EKG 12-Lead (Completed)   Breast cancer screening       Relevant Orders   MM DIGITAL  SCREENING BILATERAL     I spent 40 minutes with the patient and more than 50% of the time was spent in consultation regarding the above.  Time spent  addressing acute issues and doing her physical exam.  Also time spent discussing treatment plan and plans for f/u.     [name], MD

## 2016-06-17 NOTE — Assessment & Plan Note (Signed)
Physical today 06/17/16.  Colonoscopy 05/07/14.  Mammogram 06/19/15 - Birads I.

## 2016-06-18 ENCOUNTER — Telehealth: Payer: Self-pay

## 2016-06-18 ENCOUNTER — Other Ambulatory Visit: Payer: Self-pay | Admitting: Internal Medicine

## 2016-06-18 DIAGNOSIS — R7989 Other specified abnormal findings of blood chemistry: Secondary | ICD-10-CM

## 2016-06-18 NOTE — Telephone Encounter (Signed)
-----   Message from [name], MD sent at 06/18/2016  5:30 AM EST ----- Please call pt and let her know that her kidney function tests - decreased some when compared to previous check.  Needs to stay hydrated.  Will need to follow.  Recheck metabolic panel within the next 10 days.  (non fasting lab).  Cholesterol increased slightly when compared to previous check.  Continue low cholesterol diet.  hgb stable.  Thyroid test and liver function tests are wnl.  Overall sugar control stable.

## 2016-06-18 NOTE — Progress Notes (Signed)
Order placed for f/u met b 

## 2016-06-18 NOTE — Telephone Encounter (Signed)
Pt informed lab app made pt informed. Wanted to make let you know that they did change bp meds to have tab. She also wanted labs sent I have faxed them to his office.

## 2016-06-18 NOTE — Telephone Encounter (Signed)
L/m to call with husband

## 2016-06-18 NOTE — Telephone Encounter (Signed)
Noted.  Will keep [place] posted.

## 2016-06-24 DIAGNOSIS — R0602 Shortness of breath: Secondary | ICD-10-CM | POA: Diagnosis not present

## 2016-06-24 DIAGNOSIS — I340 Nonrheumatic mitral (valve) insufficiency: Secondary | ICD-10-CM | POA: Diagnosis not present

## 2016-06-24 DIAGNOSIS — R9431 Abnormal electrocardiogram [ECG] [EKG]: Secondary | ICD-10-CM | POA: Diagnosis not present

## 2016-06-24 DIAGNOSIS — E782 Mixed hyperlipidemia: Secondary | ICD-10-CM | POA: Diagnosis not present

## 2016-06-24 DIAGNOSIS — I10 Essential (primary) hypertension: Secondary | ICD-10-CM | POA: Diagnosis not present

## 2016-06-24 DIAGNOSIS — I420 Dilated cardiomyopathy: Secondary | ICD-10-CM | POA: Diagnosis not present

## 2016-06-27 ENCOUNTER — Encounter: Payer: Self-pay | Admitting: Internal Medicine

## 2016-06-27 NOTE — Assessment & Plan Note (Signed)
Not on cholesterol medication.  Last LDL 90s.  Follow.  Recheck lipid panel today.

## 2016-06-27 NOTE — Assessment & Plan Note (Signed)
Follow met b and a1c.

## 2016-06-27 NOTE — Assessment & Plan Note (Signed)
Follow kidney function. Recheck met b today.  Avoid antiinflammatories.

## 2016-06-27 NOTE — Assessment & Plan Note (Signed)
Blood pressure under good control.  Continue same medication regimen.  Follow pressures.  Follow metabolic panel.   

## 2016-06-27 NOTE — Addendum Note (Signed)
Addended by: Alisa Graff on: 06/27/2016 07:41 AM   Modules accepted: Level of Service

## 2016-06-27 NOTE — Assessment & Plan Note (Signed)
Followed by Dr Alva Garnet.  Cardiac evaluation planned as outlined.  Keep scheduled appt with Dr Alva Garnet.  Continue inhalers.  Reflux controlled.

## 2016-06-27 NOTE — Assessment & Plan Note (Signed)
Controlled on prevacid.

## 2016-06-27 NOTE — Assessment & Plan Note (Signed)
Has moderate copd.  Sees Dr Alva Garnet.  On spiriva.  Uses albuterol prn.  Has f/u planned at the end of this month.  Given her sob with activity, EKG obtained and revealed SR with frequent PVCs (bigeminy).  Given symptoms and EKG findings, her cardiologist was notified and agreed to see the pt today for further cardiac w/up.

## 2016-06-27 NOTE — Assessment & Plan Note (Signed)
Followed by GI Gustavo Lah).

## 2016-06-27 NOTE — Assessment & Plan Note (Signed)
Saw Dr Ma Hillock.  Has had extensive w/up.  Follow cbc.

## 2016-06-28 ENCOUNTER — Other Ambulatory Visit (INDEPENDENT_AMBULATORY_CARE_PROVIDER_SITE_OTHER): Payer: Medicare Other

## 2016-06-28 DIAGNOSIS — R7989 Other specified abnormal findings of blood chemistry: Secondary | ICD-10-CM | POA: Diagnosis not present

## 2016-06-28 LAB — BASIC METABOLIC PANEL
BUN: 16 mg/dL (ref 7–25)
CHLORIDE: 103 mmol/L (ref 98–110)
CO2: 27 mmol/L (ref 20–31)
Calcium: 9.3 mg/dL (ref 8.6–10.4)
Creat: 1.41 mg/dL — ABNORMAL HIGH (ref 0.60–0.88)
GLUCOSE: 100 mg/dL — AB (ref 65–99)
POTASSIUM: 4.6 mmol/L (ref 3.5–5.3)
Sodium: 143 mmol/L (ref 135–146)

## 2016-06-28 NOTE — Addendum Note (Signed)
Addended by: Leeanne Rio on: 06/28/2016 11:07 AM   Modules accepted: Orders

## 2016-06-29 ENCOUNTER — Ambulatory Visit: Payer: PRIVATE HEALTH INSURANCE | Admitting: Pulmonary Disease

## 2016-07-02 ENCOUNTER — Telehealth: Payer: Self-pay | Admitting: Internal Medicine

## 2016-07-02 ENCOUNTER — Telehealth: Payer: Self-pay | Admitting: Radiology

## 2016-07-02 NOTE — Telephone Encounter (Signed)
Notify her he felt things were stable and her kidney function that we drew was a little better than his last check.  Thanks

## 2016-07-02 NOTE — Telephone Encounter (Signed)
Left message to return call to our office.  

## 2016-07-02 NOTE — Telephone Encounter (Signed)
Pt coming in for labs on Monday, please place future orders. Thank you.

## 2016-07-02 NOTE — Telephone Encounter (Signed)
Not coming in for labs.  Spoke to her cardiologist.  Not needed.

## 2016-07-02 NOTE — Telephone Encounter (Signed)
Pt wanted to know if she still needs to be concerned about kidney function.

## 2016-07-02 NOTE — Telephone Encounter (Signed)
Please call and notify pt that I spoke to Dr Chancy Milroy and he wants her to stay on her lasix as she was doing prior to stopping.  No need to come in for f/u met b next week.  Thanks.  Any problems let us or him know.

## 2016-07-05 ENCOUNTER — Other Ambulatory Visit: Payer: Medicare Other

## 2016-07-06 NOTE — Telephone Encounter (Signed)
Patient informed will call if any questions.  

## 2016-07-12 DIAGNOSIS — I10 Essential (primary) hypertension: Secondary | ICD-10-CM | POA: Diagnosis not present

## 2016-07-12 DIAGNOSIS — I509 Heart failure, unspecified: Secondary | ICD-10-CM | POA: Diagnosis not present

## 2016-07-12 DIAGNOSIS — I420 Dilated cardiomyopathy: Secondary | ICD-10-CM | POA: Diagnosis not present

## 2016-07-12 DIAGNOSIS — I340 Nonrheumatic mitral (valve) insufficiency: Secondary | ICD-10-CM | POA: Diagnosis not present

## 2016-07-22 ENCOUNTER — Ambulatory Visit
Admission: RE | Admit: 2016-07-22 | Discharge: 2016-07-22 | Disposition: A | Discharge status: Home / Self Care | Payer: Medicare Other | Source: Ambulatory Visit | Attending: Internal Medicine | Admitting: Internal Medicine

## 2016-07-22 DIAGNOSIS — Z1239 Encounter for other screening for malignant neoplasm of breast: Secondary | ICD-10-CM

## 2016-07-22 DIAGNOSIS — Z1231 Encounter for screening mammogram for malignant neoplasm of breast: Secondary | ICD-10-CM | POA: Insufficient documentation

## 2016-08-06 DIAGNOSIS — I509 Heart failure, unspecified: Secondary | ICD-10-CM | POA: Diagnosis not present

## 2016-08-06 DIAGNOSIS — R0602 Shortness of breath: Secondary | ICD-10-CM | POA: Diagnosis not present

## 2016-08-06 DIAGNOSIS — I340 Nonrheumatic mitral (valve) insufficiency: Secondary | ICD-10-CM | POA: Diagnosis not present

## 2016-08-25 ENCOUNTER — Other Ambulatory Visit: Payer: Self-pay | Admitting: Pulmonary Disease

## 2016-08-27 ENCOUNTER — Ambulatory Visit (INDEPENDENT_AMBULATORY_CARE_PROVIDER_SITE_OTHER): Payer: Medicare Other | Admitting: Internal Medicine

## 2016-08-27 ENCOUNTER — Encounter: Payer: Self-pay | Admitting: Internal Medicine

## 2016-08-27 DIAGNOSIS — N183 Chronic kidney disease, stage 3 unspecified: Secondary | ICD-10-CM

## 2016-08-27 DIAGNOSIS — K2100 Gastro-esophageal reflux disease with esophagitis, without bleeding: Secondary | ICD-10-CM

## 2016-08-27 DIAGNOSIS — K2270 Barrett's esophagus without dysplasia: Secondary | ICD-10-CM

## 2016-08-27 DIAGNOSIS — J449 Chronic obstructive pulmonary disease, unspecified: Secondary | ICD-10-CM | POA: Diagnosis not present

## 2016-08-27 DIAGNOSIS — I10 Essential (primary) hypertension: Secondary | ICD-10-CM | POA: Diagnosis not present

## 2016-08-27 DIAGNOSIS — J4520 Mild intermittent asthma, uncomplicated: Secondary | ICD-10-CM

## 2016-08-27 DIAGNOSIS — R0902 Hypoxemia: Secondary | ICD-10-CM

## 2016-08-27 DIAGNOSIS — D649 Anemia, unspecified: Secondary | ICD-10-CM | POA: Diagnosis not present

## 2016-08-27 DIAGNOSIS — R739 Hyperglycemia, unspecified: Secondary | ICD-10-CM

## 2016-08-27 DIAGNOSIS — R0602 Shortness of breath: Secondary | ICD-10-CM

## 2016-08-27 NOTE — Progress Notes (Signed)
Pre-visit discussion using our clinic review tool. No additional management support is needed unless otherwise documented below in the visit note.  

## 2016-08-27 NOTE — Progress Notes (Signed)
Patient ID: Anita Ferrell, female   DOB: 29-Jan-1933, 83 y.o.   MRN: [id number]   Subjective:    Patient ID: Anita Ferrell, female    DOB: 1933-04-11, 83 y.o.   MRN: [id number]  HPI  Patient here for a scheduled follow up.  Last visit reported persistent sob with increased activity.  See last note.  Has seen Dr Alva Garnet.  Felt stable from pulmonary standpoint.  Sees her cardiologist (Dr Chancy Milroy) regularly.  Has her on qod lasix now.  States was found to have persistent issues with one of her heart valves and has been referred to [facility] to a "heart valve specialist".  States cardiology feels like her sob is coming from this  She still gets sob with exertion.  Some cough.  States they have been following her renal function and was told last check improved some.  Declines repeat labs today.  Has lost some weight.  She reports she has been weighing herself and her weight has been stable.  Is eating.  No nausea or vomiting.  Bowels moving.     Past Medical History:  Diagnosis Date  . Allergy   . Asthma   . COPD (chronic obstructive pulmonary disease) ([place])   . GERD (gastroesophageal reflux disease)   . Hypercholesteremia   . Hypertension   . Pulmonary hypertension ([facility])    Right Ventricular systolic pressure at 60 mm Hg by echo on july of 2011; Right heart cardic catherization revealed pulmonary artery pressusre of 27/10 with a mean of; Ventricular pressure 29/10 with pulmonary capillary wedge at 9  . TB (pulmonary tuberculosis)    treated with INH therapy    Past Surgical History:  Procedure Laterality Date  . CATARACT EXTRACTION Bilateral 2008  . CHOLECYSTECTOMY    . TONSILLECTOMY  1964   Family History  Problem Relation Age of Onset  . Heart disease Mother   . Heart disease Father   . Hypertension Sister   . Heart disease Brother   . Breast cancer Maternal Aunt    Social History   Social History  . Marital status: Widowed    Spouse name: N/A  . Number of children: 3  . Years  of education: N/A   Social History Main Topics  . Smoking status: Former Research scientist (life sciences)  . Smokeless tobacco: Never Used     Comment: Quit in 1992  . Alcohol use No  . Drug use: No  . Sexual activity: No   Other Topics Concern  . None   Social History Narrative  . None    Outpatient Encounter Prescriptions as of 08/27/2016  Medication Sig  . acetaminophen (TYLENOL) 500 MG tablet Take 500 mg by mouth every 6 (six) hours as needed.  Marland Kitchen aspirin EC 81 MG tablet Take 81 mg by mouth daily.   Marland Kitchen azelastine (ASTELIN) 0.1 % nasal spray Place 1 spray into both nostrils 2 (two) times daily.  . Calcium Carbonate-Vitamin D (CALCIUM 600+D) 600-400 MG-UNIT tablet Take 1 tablet by mouth at bedtime.  . ferrous sulfate 325 (65 FE) MG tablet Take 325 mg by mouth 2 (two) times daily with a meal.   . fexofenadine (ALLEGRA) 180 MG tablet Take 180 mg by mouth daily.   . furosemide (LASIX) 40 MG tablet Take 40 mg by mouth every other day.  . lansoprazole (PREVACID) 30 MG capsule Take 30 mg by mouth daily.   . meclizine (ANTIVERT) 25 MG tablet Take 25 mg by mouth 3 (three) times daily as needed  for dizziness. Reported on 10/15/2015  . metoprolol (LOPRESSOR) 50 MG tablet Take 25 mg by mouth 2 (two) times daily.   . Multiple Vitamins-Minerals (CENTRUM SILVER PO) Take by mouth.  . Probiotic Product (ALIGN PO) Take by mouth.  . sacubitril-valsartan (ENTRESTO) 24-26 MG Take 1 tablet by mouth 2 (two) times daily.  Marland Kitchen SPIRIVA HANDIHALER 18 MCG inhalation capsule INHALE CONTENTS OF ONE CAPSULE ONCE A DAY AS DIRECTED  . spironolactone (ALDACTONE) 25 MG tablet Take 12.5 mg by mouth daily.   . VENTOLIN HFA 108 (90 Base) MCG/ACT inhaler INHALE TWO PUFFS EVERY FOUR HOURS AS NEEDED. FOR WHEEZE/SHORTNESS OF BREATH  . [DISCONTINUED] pseudoephedrine-guaifenesin (MUCINEX D) 60-600 MG 12 hr tablet Take 1 tablet by mouth every 12 (twelve) hours.   No facility-administered encounter medications on file as of 08/27/2016.     Review of  Systems  Constitutional:       Has lost weight.  Eating.    HENT: Negative for congestion and sinus pressure.   Respiratory: Positive for cough and shortness of breath. Negative for chest tightness.   Cardiovascular: Negative for chest pain, palpitations and leg swelling.  Gastrointestinal: Negative for abdominal pain, diarrhea, nausea and vomiting.  Genitourinary: Negative for difficulty urinating and dysuria.  Musculoskeletal: Negative for back pain and joint swelling.  Skin: Negative for color change and rash.  Neurological: Negative for dizziness, light-headedness and headaches.  Psychiatric/Behavioral: Negative for agitation and dysphoric mood.       Objective:    Physical Exam  Constitutional: She appears well-developed and well-nourished. No distress.  HENT:  Nose: Nose normal.  Mouth/Throat: Oropharynx is clear and moist.  Neck: Neck supple. No thyromegaly present.  Cardiovascular: Normal rate and regular rhythm.   Pulmonary/Chest: Breath sounds normal. No respiratory distress. She has no wheezes.  Abdominal: Soft. Bowel sounds are normal. There is no tenderness.  Musculoskeletal: She exhibits no edema or tenderness.  Lymphadenopathy:    She has no cervical adenopathy.  Skin: No rash noted. No erythema.  Psychiatric: She has a normal mood and affect. Her behavior is normal.    BP 118/64 (BP Location: Left Arm, Patient Position: Sitting, Cuff Size: Normal)   Pulse (!) 43   Temp 97.4 F (36.3 C) (Oral)   Resp 14   Ht 5' 6" (1.676 m)   Wt 139 lb (63 kg)   SpO2 (!) 6%   BMI 22.44 kg/m  Wt Readings from Last 3 Encounters:  08/27/16 139 lb (63 kg)  06/17/16 148 lb 3.2 oz (67.2 kg)  06/04/16 148 lb (67.1 kg)     Lab Results  Component Value Date   WBC 4.6 06/17/2016   HGB 11.4 (L) 06/17/2016   HCT 35.1 (L) 06/17/2016   PLT 197.0 06/17/2016   GLUCOSE 100 (H) 06/28/2016   CHOL 181 06/17/2016   TRIG 161.0 (H) 06/17/2016   HDL 45.00 06/17/2016   LDLDIRECT  127.1 08/25/2012   LDLCALC 104 (H) 06/17/2016   ALT 9 06/17/2016   AST 14 06/17/2016   NA 143 06/28/2016   K 4.6 06/28/2016   CL 103 06/28/2016   CREATININE 1.41 (H) 06/28/2016   BUN 16 06/28/2016   CO2 27 06/28/2016   TSH 1.09 06/17/2016   HGBA1C 6.1 06/17/2016    Mm Screening Breast Tomo Bilateral  Result Date: 07/22/2016 CLINICAL DATA:  Screening. EXAM: 2D DIGITAL SCREENING BILATERAL MAMMOGRAM WITH CAD AND ADJUNCT TOMO COMPARISON:  Previous exam(s). ACR Breast Density Category b: There are scattered areas of fibroglandular density.  FINDINGS: There are no findings suspicious for malignancy. Images were processed with CAD. IMPRESSION: No mammographic evidence of malignancy. A result letter of this screening mammogram will be mailed directly to the patient. RECOMMENDATION: Screening mammogram in one year. (Code:SM-B-01Y) BI-RADS CATEGORY  1: Negative. Electronically Signed   By: Pamelia Hoit M.D.   On: 07/22/2016 16:44       Assessment & Plan:   Problem List Items Addressed This Visit    Anemia    Saw Dr Ma Hillock.  Had extensive w/up.  Last hgb stable 11.4.  Follow.       Asthma, chronic    Followed by pulmonary.        Barrett's esophagus    Followed by GI (Dr Renne Musca.        CKD (chronic kidney disease) stage 3, GFR 30-59 ml/min    States was told on recent check, kidney function some better.  Avoid antiinflammatories.  Follow.        COPD with hypoxia ([place])    Followed by Dr Alva Garnet.  Per pt, felt stable from lung standpoint.  Pursuing further cardiac w/up for evaluation of her sob.        Essential hypertension    Blood pressure under good control.  Continue same medication regimen.  Follow pressures.  Follow metabolic panel.        GERD (gastroesophageal reflux disease)    Controlled on current regimen.  Follow.       Hyperglycemia    Low carb diet and exercise.  Follow met b and a1c.        Shortness of breath    Has copd.  Evaluated and followed by Dr  Alva Garnet.  Per pt, felt stable from cardiac standpoint.  Continue spiriva.  Has albuterol if needed.  Being worked up by cardiology.  Has been referred to [facility] for further evaluation of heart valve issues.            [name], MD

## 2016-08-28 ENCOUNTER — Encounter: Payer: Self-pay | Admitting: Internal Medicine

## 2016-08-28 NOTE — Assessment & Plan Note (Signed)
Low carb diet and exercise.  Follow met b and a1c.   

## 2016-08-28 NOTE — Assessment & Plan Note (Signed)
Blood pressure under good control.  Continue same medication regimen.  Follow pressures.  Follow metabolic panel.   

## 2016-08-28 NOTE — Assessment & Plan Note (Signed)
Controlled on current regimen.  Follow.  

## 2016-08-28 NOTE — Assessment & Plan Note (Signed)
Followed by pulmonary 

## 2016-08-28 NOTE — Assessment & Plan Note (Signed)
States was told on recent check, kidney function some better.  Avoid antiinflammatories.  Follow.

## 2016-08-28 NOTE — Assessment & Plan Note (Signed)
Followed by GI (Dr Renne Musca.

## 2016-08-28 NOTE — Assessment & Plan Note (Signed)
Has copd.  Evaluated and followed by Dr Alva Garnet.  Per pt, felt stable from cardiac standpoint.  Continue spiriva.  Has albuterol if needed.  Being worked up by cardiology.  Has been referred to [facility] for further evaluation of heart valve issues.

## 2016-08-28 NOTE — Assessment & Plan Note (Signed)
Saw Dr Ma Hillock.  Had extensive w/up.  Last hgb stable 11.4.  Follow.

## 2016-08-28 NOTE — Assessment & Plan Note (Signed)
Followed by Dr Alva Garnet.  Per pt, felt stable from lung standpoint.  Pursuing further cardiac w/up for evaluation of her sob.

## 2016-09-09 DIAGNOSIS — I340 Nonrheumatic mitral (valve) insufficiency: Secondary | ICD-10-CM | POA: Diagnosis not present

## 2016-09-09 DIAGNOSIS — R0602 Shortness of breath: Secondary | ICD-10-CM | POA: Diagnosis not present

## 2016-09-10 ENCOUNTER — Encounter: Payer: Self-pay | Admitting: Internal Medicine

## 2016-09-23 DIAGNOSIS — R001 Bradycardia, unspecified: Secondary | ICD-10-CM | POA: Diagnosis not present

## 2016-09-23 DIAGNOSIS — J449 Chronic obstructive pulmonary disease, unspecified: Secondary | ICD-10-CM | POA: Diagnosis not present

## 2016-09-23 DIAGNOSIS — I499 Cardiac arrhythmia, unspecified: Secondary | ICD-10-CM | POA: Diagnosis not present

## 2016-09-23 DIAGNOSIS — I498 Other specified cardiac arrhythmias: Secondary | ICD-10-CM | POA: Insufficient documentation

## 2016-09-23 DIAGNOSIS — R0602 Shortness of breath: Secondary | ICD-10-CM | POA: Diagnosis not present

## 2016-09-23 DIAGNOSIS — R9431 Abnormal electrocardiogram [ECG] [EKG]: Secondary | ICD-10-CM | POA: Diagnosis not present

## 2016-09-23 DIAGNOSIS — I081 Rheumatic disorders of both mitral and tricuspid valves: Secondary | ICD-10-CM | POA: Diagnosis not present

## 2016-09-23 DIAGNOSIS — I340 Nonrheumatic mitral (valve) insufficiency: Secondary | ICD-10-CM | POA: Diagnosis not present

## 2016-09-23 DIAGNOSIS — Z87891 Personal history of nicotine dependence: Secondary | ICD-10-CM | POA: Diagnosis not present

## 2016-10-17 HISTORY — PX: CARDIAC CATHETERIZATION: SHX172

## 2016-10-19 DIAGNOSIS — I340 Nonrheumatic mitral (valve) insufficiency: Secondary | ICD-10-CM | POA: Diagnosis not present

## 2016-10-25 DIAGNOSIS — R0602 Shortness of breath: Secondary | ICD-10-CM | POA: Diagnosis not present

## 2016-10-25 DIAGNOSIS — R0609 Other forms of dyspnea: Secondary | ICD-10-CM | POA: Diagnosis not present

## 2016-10-25 DIAGNOSIS — I340 Nonrheumatic mitral (valve) insufficiency: Secondary | ICD-10-CM | POA: Diagnosis not present

## 2016-10-25 DIAGNOSIS — I10 Essential (primary) hypertension: Secondary | ICD-10-CM | POA: Diagnosis not present

## 2016-10-25 DIAGNOSIS — J449 Chronic obstructive pulmonary disease, unspecified: Secondary | ICD-10-CM | POA: Diagnosis not present

## 2016-10-25 DIAGNOSIS — I2510 Atherosclerotic heart disease of native coronary artery without angina pectoris: Secondary | ICD-10-CM | POA: Diagnosis not present

## 2016-10-25 DIAGNOSIS — I272 Pulmonary hypertension, unspecified: Secondary | ICD-10-CM | POA: Diagnosis not present

## 2016-10-27 ENCOUNTER — Encounter: Payer: Self-pay | Admitting: Internal Medicine

## 2016-10-27 DIAGNOSIS — I340 Nonrheumatic mitral (valve) insufficiency: Secondary | ICD-10-CM

## 2016-10-27 DIAGNOSIS — R0602 Shortness of breath: Secondary | ICD-10-CM

## 2016-10-29 NOTE — Telephone Encounter (Signed)
Order placed for cardiology referral.   

## 2016-11-23 ENCOUNTER — Ambulatory Visit (INDEPENDENT_AMBULATORY_CARE_PROVIDER_SITE_OTHER): Payer: Medicare Other | Admitting: Pulmonary Disease

## 2016-11-23 ENCOUNTER — Encounter: Payer: Self-pay | Admitting: Pulmonary Disease

## 2016-11-23 VITALS — BP 102/68 | HR 70 | Resp 16 | Ht 66.0 in | Wt 142.0 lb

## 2016-11-23 DIAGNOSIS — I429 Cardiomyopathy, unspecified: Secondary | ICD-10-CM

## 2016-11-23 DIAGNOSIS — J449 Chronic obstructive pulmonary disease, unspecified: Secondary | ICD-10-CM

## 2016-11-23 DIAGNOSIS — R0609 Other forms of dyspnea: Secondary | ICD-10-CM | POA: Diagnosis not present

## 2016-11-23 MED ORDER — UMECLIDINIUM-VILANTEROL 62.5-25 MCG/INH IN AEPB: 1.0000 | INHALATION_SPRAY | Freq: Every day | RESPIRATORY_TRACT | 5 refills | Status: DC

## 2016-11-23 NOTE — Patient Instructions (Signed)
-   Discontinue Spiriva - Begin Anoro inhaler - one inhalation daily. Prescription entered - Change albuterol inhaler to as needed only - 1-2 puffs every 6 hours for increased shortness of breath, wheezing, chest tightness - Follow-up in 3-4 months

## 2016-11-23 NOTE — Progress Notes (Signed)
PULMONARY OFFICE FOLLOW UP NOTE  Requesting MD/Service: Self Date of initial consultation: 06/20/15 Reason for consultation: COPD, lung nodule  Initial office HPI (06/20/15):  25 F with history of COPD previously followed by Dr [name] (last seen by him in Aug 2016) and seen by Dr Stevenson Clinch in consultation in hospital in Nov 2016 when she was admitted with AECOPD. At that time, a CTA chest was performed revealing a new RLL nodule. Presently, she is at her baseline class III dyspnea. She has intermittent cough which is predominantly nonproductive. She has a documented history of TB which, by her description, was latent TB treated with INH for 9 months. Denies CP, fever, purulent sputum, hemoptysis, LE edema and calf tenderness. She notes that she was previously on [facility] and was discharged in Nov on Advair. She indicates that she prefers the [facility] Initial IMP/PLAN: COPD/emphysema, Lung nodule - incidental finding on CTA chest, H/O positive PPD - S/P 9 months INH. Stopped theophylline, Changed Advair to [facility] inhaler - 2 puffs twice a day, Continued Spiriva, Continued prednisone @ 5 mg daily with plan to try to reduce this slowly over time, Repeat CT chest, Follow up in 6 weeks with PFTs  CT chest 07/29/15: 1. Significant decrease in size of the previously demonstrated right lower lobe nodule, compatible with a benign process. 2. No significant change in multiple additional sub cm nodules and calcified granulomata in both lungs, compatible with a benign process. 3. Mild changes of COPD and chronic bronchitis. 4. Resolved infection in the lingula and left lower lobe  PFTs 07/29/15: moderate obstruction, normal lung volumes, mild reduction DLCO  ROV 08/04/15: No new complaints. Here to review results of PFTs and CT chest. Continue Dulera and Spiriva, change Pred to 5 mg qod for one month, then stop. F/U 6 wks  ED 09/08/15: increased dyspnea. CXR without acute findings. BNP elevated. TTE performed and  revealed LVEF 35-40%. Referred to cardiology (Dr Humphrey Rolls) who initiated therapy for CHF. Nuclear scan and CT coronary angiogram reportedly without evidence of ischemia.   ROV 09/16/15: Off prednisone without any worsening of symptoms ROV 12/26/15: No new complaints or problems. Overall improved. Never uses rescue MDI. Trial off Spiriva ROV 06/04/16: No new pulmonary problems. Has stopped Symbicort without worsening of her respiratory symptoms. Remains on Symbicort. No changes made TEE 10/19/16: LVEF 45%. Moderate mitral regurgitation [facility] 10/25/16: Nonobstructive coronary disease. Moderate pulmonary hypertension (systolic 50 mmHg, mean 30 mmHg)  SUBJ: This is a routine reevaluation. Since last seen, she has undergone cardiac evaluation at [facility] with findings as documented above. She continues to have moderate DOE, not significantly changed from previously. She has recently been using albuterol more frequently and notes an improvement in DOE with this medication. She remains on Spiriva. Denes CP, fever, purulent sputum, hemoptysis, LE edema and calf tenderness  ONJ: Vitals:   11/23/16 1050 11/23/16 1051  BP:  102/68  Pulse:  70  Resp: 16   SpO2:  97%  Weight: 142 lb (64.4 kg)   Height: 5\' 6"  (1.676 m)    EXAM:  Gen: No overt distress HEENT: NCAT, WNL Neck: Supple without, JVD Lungs: breath sounds mildly to moderately diminished, No wheezes Cardiovascular: Frequent extrasystoles, no murmur Abdomen: Soft, nontender, normal BS Ext: without clubbing, cyanosis, edema  DATA:   BMP Latest Ref Rng & Units 06/28/2016 06/17/2016 01/13/2016  Glucose 65 - 99 mg/dL 100(H) 111(H) 105(H)  BUN 7 - 25 mg/dL 16 14 16   Creatinine 0.60 - 0.88 mg/dL 1.41(H) 1.28(H)  1.15  Sodium 135 - 146 mmol/L 143 140 143  Potassium 3.5 - 5.3 mmol/L 4.6 4.7 4.7  Chloride 98 - 110 mmol/L 103 103 107  CO2 20 - 31 mmol/L 27 31 32  Calcium 8.6 - 10.4 mg/dL 9.3 9.8 9.6    CBC Latest Ref Rng & Units 06/17/2016 03/02/2016  02/02/2016  WBC 4.0 - 10.5 K/uL 4.6 3.9(L) 3.5(L)  Hemoglobin 12.0 - 15.0 g/dL 11.4(L) 11.1(L) 11.0(L)  Hematocrit 36.0 - 46.0 % 35.1(L) 34.4(L) 33.8(L)  Platelets 150.0 - 400.0 K/uL 197.0 185.0 186.0    CXR: NNF   IMPRESSION:   COPD, moderate - Reports benefit from albuterol  DOE - multifactorial. Components of both pulmonary and cardiac etiologies.   PLAN:  Discontinue Spiriva Begin Anoro inhaler, one inhalation daily Cont PRN albuterol as needed for increased SOB, wheezing or chest tightness ROV in 3-4 months or as needed   Merton Border, MD PCCM service Mobile [phone] Pager [phone] 11/23/2016

## 2016-11-24 ENCOUNTER — Telehealth: Payer: Self-pay | Admitting: Pulmonary Disease

## 2016-11-24 ENCOUNTER — Other Ambulatory Visit: Payer: Self-pay | Admitting: Pulmonary Disease

## 2016-11-24 NOTE — Telephone Encounter (Signed)
[facility] does have rx refill for Astelin nasal spray. They will have to order it and will call patient when ready.

## 2016-11-24 NOTE — Telephone Encounter (Signed)
Called patient to inform nasal was sent on 11/24/16.

## 2016-11-24 NOTE — Telephone Encounter (Signed)
°*  STAT* If patient is at the pharmacy, call can be transferred to refill team.   1. Which medications need to be refilled? (please list name of each medication and dose if known) azelastine   2. Which pharmacy/location (including street and city if local pharmacy) is medication to be sent to? [facility]   3. Do they need a 30 day or 90 day supply? 90 day

## 2016-12-01 ENCOUNTER — Ambulatory Visit (INDEPENDENT_AMBULATORY_CARE_PROVIDER_SITE_OTHER): Payer: Medicare Other | Admitting: Internal Medicine

## 2016-12-01 ENCOUNTER — Encounter: Payer: Self-pay | Admitting: Internal Medicine

## 2016-12-01 VITALS — BP 110/60 | HR 100 | Temp 98.6°F | Resp 14 | Ht 63.0 in | Wt 143.2 lb

## 2016-12-01 DIAGNOSIS — R0902 Hypoxemia: Secondary | ICD-10-CM

## 2016-12-01 DIAGNOSIS — R739 Hyperglycemia, unspecified: Secondary | ICD-10-CM

## 2016-12-01 DIAGNOSIS — I10 Essential (primary) hypertension: Secondary | ICD-10-CM | POA: Diagnosis not present

## 2016-12-01 DIAGNOSIS — Z0000 Encounter for general adult medical examination without abnormal findings: Secondary | ICD-10-CM

## 2016-12-01 DIAGNOSIS — K2270 Barrett's esophagus without dysplasia: Secondary | ICD-10-CM

## 2016-12-01 DIAGNOSIS — J449 Chronic obstructive pulmonary disease, unspecified: Secondary | ICD-10-CM | POA: Diagnosis not present

## 2016-12-01 DIAGNOSIS — D649 Anemia, unspecified: Secondary | ICD-10-CM | POA: Diagnosis not present

## 2016-12-01 DIAGNOSIS — N183 Chronic kidney disease, stage 3 unspecified: Secondary | ICD-10-CM

## 2016-12-01 DIAGNOSIS — K2100 Gastro-esophageal reflux disease with esophagitis, without bleeding: Secondary | ICD-10-CM

## 2016-12-01 DIAGNOSIS — E7800 Pure hypercholesterolemia, unspecified: Secondary | ICD-10-CM

## 2016-12-01 DIAGNOSIS — I340 Nonrheumatic mitral (valve) insufficiency: Secondary | ICD-10-CM | POA: Diagnosis not present

## 2016-12-01 LAB — BASIC METABOLIC PANEL
BUN: 19 mg/dL (ref 6–23)
CO2: 34 mEq/L — ABNORMAL HIGH (ref 19–32)
Calcium: 10 mg/dL (ref 8.4–10.5)
Chloride: 102 mEq/L (ref 96–112)
Creatinine, Ser: 1.28 mg/dL — ABNORMAL HIGH (ref 0.40–1.20)
GFR: 42.37 mL/min — AB (ref >60.00)
Glucose, Bld: 112 mg/dL — ABNORMAL HIGH (ref 70–99)
Potassium: 5 mEq/L (ref 3.5–5.1)
Sodium: 142 mEq/L (ref 135–145)

## 2016-12-01 LAB — CBC WITH DIFFERENTIAL/PLATELET
Basophils Absolute: 0 10*3/uL (ref 0.0–0.1)
Basophils Relative: 0.5 % (ref 0.0–3.0)
EOS PCT: 4.9 % (ref 0.0–5.0)
Eosinophils Absolute: 0.2 10*3/uL (ref 0.0–0.7)
HEMATOCRIT: 36 % (ref 36.0–46.0)
HEMOGLOBIN: 11.6 g/dL — AB (ref 12.0–15.0)
LYMPHS ABS: 1.1 10*3/uL (ref 0.7–4.0)
LYMPHS PCT: 29.6 % (ref 12.0–46.0)
MCHC: 32.3 g/dL (ref 30.0–36.0)
MCV: 85.5 fl (ref 78.0–100.0)
MONOS PCT: 7.6 % (ref 3.0–12.0)
Monocytes Absolute: 0.3 10*3/uL (ref 0.1–1.0)
NEUTROS ABS: 2.1 10*3/uL (ref 1.4–7.7)
Neutrophils Relative %: 57.4 % (ref 43.0–77.0)
Platelets: 182 10*3/uL (ref 150.0–400.0)
RBC: 4.21 Mil/uL (ref 3.87–5.11)
RDW: 14.1 % (ref 11.5–15.5)
WBC: 3.7 10*3/uL — ABNORMAL LOW (ref 4.0–10.5)

## 2016-12-01 LAB — HEPATIC FUNCTION PANEL
ALBUMIN: 4.1 g/dL (ref 3.5–5.2)
ALT: 13 U/L (ref 0–35)
AST: 20 U/L (ref 0–37)
Alkaline Phosphatase: 63 U/L (ref 39–117)
Bilirubin, Direct: 0.1 mg/dL (ref 0.0–0.3)
TOTAL PROTEIN: 6.1 g/dL (ref 6.0–8.3)
Total Bilirubin: 0.6 mg/dL (ref 0.2–1.2)

## 2016-12-01 LAB — LIPID PANEL
Cholesterol: 180 mg/dL (ref 0–200)
HDL: 42 mg/dL (ref >39.00)
LDL Cholesterol: 106 mg/dL — ABNORMAL HIGH (ref 0–99)
NONHDL: 138.37
TRIGLYCERIDES: 161 mg/dL — AB (ref 0.0–149.0)
Total CHOL/HDL Ratio: 4
VLDL: 32.2 mg/dL (ref 0.0–40.0)

## 2016-12-01 LAB — FERRITIN: FERRITIN: 132.5 ng/mL (ref 10.0–291.0)

## 2016-12-01 LAB — HEMOGLOBIN A1C: Hgb A1c MFr Bld: 6 % (ref 4.6–6.5)

## 2016-12-01 NOTE — Progress Notes (Signed)
Pre-visit discussion using our clinic review tool. No additional management support is needed unless otherwise documented below in the visit note.  

## 2016-12-01 NOTE — Progress Notes (Signed)
Subjective:   [name] is a 83 y.o. female who presents for an Initial Medicare Annual Wellness Visit.  Review of Systems    No ROS.  Medicare Wellness Visit. Additional risk factors are reflected in the social history.  Cardiac Risk Factors include: advanced age (>80men, >63 women);hypertension     Objective:    Today's Vitals   12/01/16 1042  BP: 110/60  Pulse: 100  Resp: 14  Temp: 98.6 F (37 C)  TempSrc: Oral  SpO2: 94%  Weight: 143 lb 3.2 oz (65 kg)  Height: 5\' 6"  (1.676 m)   Body mass index is 23.11 kg/m.   Current Medications (verified) Outpatient Encounter Prescriptions as of 12/01/2016  Medication Sig  . acetaminophen (TYLENOL) 500 MG tablet Take 500 mg by mouth every 6 (six) hours as needed.  Marland Kitchen aspirin EC 81 MG tablet Take 81 mg by mouth daily.   Marland Kitchen azelastine (ASTELIN) 0.1 % nasal spray USE 1 SPRAY EACH NOSTRIL TWO TIMES DAILY  . Calcium Carbonate-Vitamin D (CALCIUM 600+D) 600-400 MG-UNIT tablet Take 1 tablet by mouth at bedtime.  . docusate sodium (COLACE) 100 MG capsule Take by mouth.  . Ferrous Fumarate (HEMOCYTE - 106 MG FE) 324 (106 Fe) MG TABS tablet Take by mouth.  . ferrous sulfate 325 (65 FE) MG tablet Take 325 mg by mouth 2 (two) times daily with a meal.   . fexofenadine (ALLEGRA) 180 MG tablet Take 180 mg by mouth daily.   . furosemide (LASIX) 40 MG tablet Take 40 mg by mouth every other day.  . lansoprazole (PREVACID) 30 MG capsule Take 30 mg by mouth daily.   . meclizine (ANTIVERT) 25 MG tablet Take 25 mg by mouth 3 (three) times daily as needed for dizziness. Reported on 10/15/2015  . metoprolol (LOPRESSOR) 50 MG tablet Take 25 mg by mouth 2 (two) times daily.   . Multiple Vitamins-Minerals (CENTRUM SILVER PO) Take by mouth.  . Probiotic Product (ALIGN PO) Take by mouth.  . sacubitril-valsartan (ENTRESTO) 24-26 MG Take 1 tablet by mouth 2 (two) times daily.  Marland Kitchen spironolactone (ALDACTONE) 25 MG tablet Take 12.5 mg by mouth daily.   Marland Kitchen  umeclidinium-vilanterol (ANORO ELLIPTA) 62.5-25 MCG/INH AEPB Inhale 1 puff into the lungs daily.  . VENTOLIN HFA 108 (90 Base) MCG/ACT inhaler INHALE TWO PUFFS EVERY FOUR HOURS AS NEEDED. FOR WHEEZE/SHORTNESS OF BREATH   No facility-administered encounter medications on file as of 12/01/2016.     Allergies (verified) Penicillins   History: Past Medical History:  Diagnosis Date  . Allergy   . Asthma   . COPD (chronic obstructive pulmonary disease) ([place])   . GERD (gastroesophageal reflux disease)   . Hypercholesteremia   . Hypertension   . Pulmonary hypertension (HCC)    Right Ventricular systolic pressure at 60 mm Hg by echo on july of 2011; Right heart cardic catherization revealed pulmonary artery pressusre of 27/10 with a mean of; Ventricular pressure 29/10 with pulmonary capillary wedge at 9  . TB (pulmonary tuberculosis)    treated with INH therapy    Past Surgical History:  Procedure Laterality Date  . CATARACT EXTRACTION Bilateral 2008  . CHOLECYSTECTOMY    . TONSILLECTOMY  1964   Family History  Problem Relation Age of Onset  . Heart disease Mother   . Heart disease Father   . Hypertension Sister   . Heart disease Brother   . Breast cancer Maternal Aunt    Social History   Occupational History  .  Not on file.   Social History Main Topics  . Smoking status: Former Research scientist (life sciences)  . Smokeless tobacco: Never Used     Comment: Quit in 1992  . Alcohol use No  . Drug use: No  . Sexual activity: No    Tobacco Counseling Counseling given: Not Answered   Activities of Daily Living In your present state of health, do you have any difficulty performing the following activities: 12/01/2016  Hearing? N  Vision? N  Difficulty concentrating or making decisions? N  Walking or climbing stairs? Y  Comment Difficulty climbing the stairs  Dressing or bathing? N  Doing errands, shopping? Y  Comment She does not currently Physiological scientist and eating ? N  Using the  Toilet? N  In the past six months, have you accidently leaked urine? N  Do you have problems with loss of bowel control? N  Managing your Medications? N  Managing your Finances? N  Housekeeping or managing your Housekeeping? N  Some recent data might be hidden    Immunizations and Health Maintenance Immunization History  Administered Date(s) Administered  . Influenza, High Dose Seasonal PF 01/13/2016  . Influenza,inj,Quad PF,36+ Mos 12/26/2012, 01/12/2014, 01/14/2015  . Pneumococcal Conjugate-13 09/02/2014  . Pneumococcal Polysaccharide-23 11/14/2012  . Tdap 11/14/2012   Health Maintenance Due  Topic Date Due  . DEXA SCAN  05/09/1999  . INFLUENZA VACCINE  11/17/2016    Patient Care Team: [name], MD as PCP - General (Internal Medicine)  Indicate any recent Medical Services you may have received from other than [facility] providers in the past year (date may be approximate).     Assessment:   This is a routine wellness examination for [place]. The goal of the wellness visit is to assist the patient how to close the gaps in care and create a preventative care plan for the patient.   The roster of all physicians providing medical care to patient is listed in the Snapshot section of the chart.  Taking calcium VIT D as appropriate/Osteoporosis risk reviewed.    Safety issues reviewed; Smoke and carbon monoxide detectors in the home. No firearms in the home.  Wears seatbelts when  riding with others. Patient does wear sunscreen or protective clothing when in direct sunlight. No violence in the home.  Patient is alert, normal appearance, oriented to person/place/and time. Correctly identified the president of the [place], recall of 3/3 words, and performing simple calculations. Displays appropriate judgement and can read correct time from watch face.   No new identified risk were noted.  No failures at ADL's or IADL's.    BMI- discussed the importance of a healthy diet, water  intake and the benefits of aerobic exercise. Educational material provided.   24 hour diet recall: Breakfast: half of doughnut stick Lunch: 2 oz steak, peas, potato Dinner: hotdog Snack: a few jelly beans Daily fluid intake: 0.5 cups of caffeine, 2 cups of water.  Dental- dentures.  Sleep patterns- Sleeps 8 hours at night.  Wakes feeling rested.  Dexa Scan declined.    Patient Concerns: None at this time. Follow up with PCP as needed.  Hearing/Vision screen Hearing Screening Comments: Patient is able to hear conversational tones without difficulty.  No issues reported.   Vision Screening Comments: Followed by Dr. Gloriann Loan Wears corrective lenses when reading Cataract extraction, bilateral Visual acuity not assessed per patient preference since they have regular follow up with the ophthalmologist  Dietary issues and exercise activities discussed: Current Exercise Habits: Home  exercise routine, Type of exercise: walking, Time (Minutes): 10, Frequency (Times/Week): 3, Weekly Exercise (Minutes/Week): 30, Intensity: Mild  Goals    . Increase lean proteins          Low carb foods Monitor sugar intake    . Increase physical activity          Walk for exercise as tolerated. Chair exercises as demonstrated.    . Increase water intake          Stay hydrated      Depression Screen PHQ 2/9 Scores 12/01/2016 02/24/2016 01/13/2016 10/15/2015 09/02/2014 08/27/2013 08/27/2012  PHQ - 2 Score 0 0 0 0 0 0 0    Fall Risk Fall Risk  12/01/2016 02/24/2016 01/13/2016 10/15/2015 09/02/2014  Falls in the past year? No No No No No    Cognitive Function:     6CIT Screen 12/01/2016  What Year? 0 points  What month? 0 points  What time? 0 points  Count back from 20 0 points  Months in reverse 0 points  Repeat phrase 0 points  Total Score 0    Screening Tests Health Maintenance  Topic Date Due  . DEXA SCAN  05/09/1999  . INFLUENZA VACCINE  11/17/2016  . MAMMOGRAM  07/22/2017  .  COLONOSCOPY  05/08/2019  . TETANUS/TDAP  11/15/2022  . PNA vac Low Risk Adult  Completed      Plan:   End of life planning; Advanced aging; Advanced directives discussed.  No HCPOA/Living Will.  Additional information declined at this time.  I have personally reviewed and noted the following in the patient's chart:   . Medical and social history . Use of alcohol, tobacco or illicit drugs  . Current medications and supplements . Functional ability and status . Nutritional status . Physical activity . Advanced directives . List of other physicians . Hospitalizations, surgeries, and ER visits in previous 12 months . Vitals . Screenings to include cognitive, depression, and falls . Referrals and appointments  In addition, I have reviewed and discussed with patient certain preventive protocols, quality metrics, and best practice recommendations. A written personalized care plan for preventive services as well as general preventive health recommendations were provided to patient.     Varney Biles, LPN   [id number]    Reviewed above information.  Agree with assessment and plan. Dr Nicki Reaper

## 2016-12-01 NOTE — Patient Instructions (Addendum)
  Ms. Shiroma , Thank you for taking time to come for your Medicare Wellness Visit. I appreciate your ongoing commitment to your health goals. Please review the following plan we discussed and let me know if I can assist you in the future.   These are the goals we discussed: Goals    . Increase lean proteins          Low carb foods Monitor sugar intake    . Increase physical activity          Walk for exercise as tolerated. Chair exercises as demonstrated.    . Increase water intake          Stay hydrated       This is a list of the screening recommended for you and due dates:  Health Maintenance  Topic Date Due  . DEXA scan (bone density measurement)  05/09/1999  . Flu Shot  11/17/2016  . Mammogram  07/22/2017  . Colon Cancer Screening  05/08/2019  . Tetanus Vaccine  11/15/2022  . Pneumonia vaccines  Completed

## 2016-12-01 NOTE — Progress Notes (Signed)
Patient ID: [name], female   DOB: 1932/11/11, 83 y.o.   MRN: [id number]   Subjective:    Patient ID: [name], female    DOB: 1932-10-22, 84 y.o.   MRN: [id number]  HPI  Patient here for a scheduled follow up.  She has been evaluated for sob/DOE.  She recently saw Dr Aline Brochure at [facility].  ECHO - mild to moderate left ventricular dysfunction with moderate to severe MR and moderate TR.  Had cath - PCWP 47mHg, moderate pulmonary hypertension in the absence of elevated PCWP, non obstructive CAD.  Felt no further cardiac intervention warranted.  Recommended medical management.  She plans to f/u with Dr GRockey Situlocally.  She recently saw Dr SAlva Garnetfor her COPD.  Was started on Anoro.  She feels she is doing better.  Breathing stable.  No acid reflux.  No abdominal pain.  Bowels stable.     Past Medical History:  Diagnosis Date  . Allergy   . Asthma   . COPD [place]) [place]   . GERD (gastroesophageal reflux disease)   . Hypercholesteremia   . Hypertension   . Pulmonary hypertension (HCC)    Right Ventricular systolic pressure at 60 mm Hg by echo on july of 2011; Right heart cardic catherization revealed pulmonary artery pressusre of 27/10 with a mean of; Ventricular pressure 29/10 with pulmonary capillary wedge at 9  . TB (pulmonary tuberculosis)    treated with INH therapy    Past Surgical History:  Procedure Laterality Date  . CATARACT EXTRACTION Bilateral 2008  . CHOLECYSTECTOMY    . TONSILLECTOMY  1964   Family History  Problem Relation Age of Onset  . Heart disease Mother   . Heart disease Father   . Hypertension Sister   . Heart disease Brother   . Breast cancer Maternal Aunt    Social History   Social History  . Marital status: Widowed    Spouse name: N/A  . Number of children: 3  . Years of education: N/A   Social History Main Topics  . Smoking status: Former SResearch scientist (life sciences) . Smokeless tobacco: Never Used     Comment: Quit in  1992  . Alcohol use No  . Drug use: No  . Sexual activity: No   Other Topics Concern  . None   Social History Narrative  . None    Outpatient Encounter Prescriptions as of 12/01/2016  Medication Sig  . acetaminophen (TYLENOL) 500 MG tablet Take 500 mg by mouth every 6 (six) hours as needed.  .Marland Kitchenaspirin EC 81 MG tablet Take 81 mg by mouth daily.   .Marland Kitchenazelastine (ASTELIN) 0.1 % nasal spray USE 1 SPRAY EACH NOSTRIL TWO TIMES DAILY  . Calcium Carbonate-Vitamin D (CALCIUM 600+D) 600-400 MG-UNIT tablet Take 1 tablet by mouth at bedtime.  . docusate sodium (COLACE) 100 MG capsule Take by mouth.  . Ferrous Fumarate (HEMOCYTE - 106 MG FE) 324 (106 Fe) MG TABS tablet Take by mouth.  . ferrous sulfate 325 (65 FE) MG tablet Take 325 mg by mouth 2 (two) times daily with a meal.   . fexofenadine (ALLEGRA) 180 MG tablet Take 180 mg by mouth daily.   . furosemide (LASIX) 40 MG tablet Take 40 mg by mouth every other day.  . lansoprazole (PREVACID) 30 MG capsule Take 30 mg by mouth daily.   . meclizine (ANTIVERT) 25 MG tablet Take 25 mg by mouth 3 (three) times daily as needed for dizziness. Reported  on 10/15/2015  . metoprolol (LOPRESSOR) 50 MG tablet Take 25 mg by mouth 2 (two) times daily.   . Multiple Vitamins-Minerals (CENTRUM SILVER PO) Take by mouth.  . Probiotic Product (ALIGN PO) Take by mouth.  . sacubitril-valsartan (ENTRESTO) 24-26 MG Take 1 tablet by mouth 2 (two) times daily.  Marland Kitchen spironolactone (ALDACTONE) 25 MG tablet Take 12.5 mg by mouth daily.   Marland Kitchen umeclidinium-vilanterol (ANORO ELLIPTA) 62.5-25 MCG/INH AEPB Inhale 1 puff into the lungs daily.  . VENTOLIN HFA 108 (90 Base) MCG/ACT inhaler INHALE TWO PUFFS EVERY FOUR HOURS AS NEEDED. FOR WHEEZE/SHORTNESS OF BREATH   No facility-administered encounter medications on file as of 12/01/2016.     Review of Systems  Constitutional: Negative for appetite change and unexpected weight change.  HENT: Negative for congestion and sinus pressure.     Respiratory: Negative for cough and chest tightness.        Breathing stable.    Cardiovascular: Negative for chest pain, palpitations and leg swelling.  Gastrointestinal: Negative for abdominal pain, diarrhea, nausea and vomiting.  Genitourinary: Negative for difficulty urinating and dysuria.  Musculoskeletal: Negative for back pain and joint swelling.  Skin: Negative for color change and rash.  Neurological: Negative for dizziness, light-headedness and headaches.  Psychiatric/Behavioral: Negative for agitation and dysphoric mood.       Objective:    Physical Exam  Constitutional: She appears well-developed and well-nourished. No distress.  HENT:  Nose: Nose normal.  Mouth/Throat: Oropharynx is clear and moist.  Neck: Neck supple. No thyromegaly present.  Cardiovascular: Normal rate and regular rhythm.   Pulmonary/Chest: No respiratory distress. She has no wheezes.  Abdominal: Soft. Bowel sounds are normal. There is no tenderness.  Musculoskeletal: She exhibits no edema or tenderness.  Lymphadenopathy:    She has no cervical adenopathy.  Skin: No rash noted. No erythema.  Psychiatric: She has a normal mood and affect. Her behavior is normal.    BP 110/60 (BP Location: Left Arm, Patient Position: Sitting, Cuff Size: Normal)   Pulse 100   Temp 98.6 F (37 C) (Oral)   Resp 14   Ht _0  (1.6 m)   Wt 143 lb 3.2 oz (65 kg)   SpO2 94%   BMI 25.37 kg/m  Wt Readings from Last 3 Encounters:  12/01/16 143 lb 3.2 oz (65 kg)  11/23/16 142 lb (64.4 kg)  08/27/16 139 lb (63 kg)     Lab Results  Component Value Date   WBC 3.7 (L) 12/01/2016   HGB 11.6 (L) 12/01/2016   HCT 36.0 12/01/2016   PLT 182.0 12/01/2016   GLUCOSE 112 (H) 12/01/2016   CHOL 180 12/01/2016   TRIG 161.0 (H) 12/01/2016   HDL 42.00 12/01/2016   LDLDIRECT 127.1 08/25/2012   LDLCALC 106 (H) 12/01/2016   ALT 13 12/01/2016   AST 20 12/01/2016   NA 142 12/01/2016   K 5.0 12/01/2016   CL 102 12/01/2016    CREATININE 1.28 (H) 12/01/2016   BUN 19 12/01/2016   CO2 34 (H) 12/01/2016   TSH 1.09 06/17/2016   HGBA1C 6.0 12/01/2016    Mm Screening Breast Tomo Bilateral  Result Date: 07/22/2016 CLINICAL DATA:  Screening. EXAM: 2D DIGITAL SCREENING BILATERAL MAMMOGRAM WITH CAD AND ADJUNCT TOMO COMPARISON:  Previous exam(s). ACR Breast Density Category b: There are scattered areas of fibroglandular density. FINDINGS: There are no findings suspicious for malignancy. Images were processed with CAD. IMPRESSION: No mammographic evidence of malignancy. A result letter of this screening mammogram will  be mailed directly to the patient. RECOMMENDATION: Screening mammogram in one year. (Code:SM-B-01Y) BI-RADS CATEGORY  1: Negative. Electronically Signed   By: Pamelia Hoit M.D.   On: 07/22/2016 16:44       Assessment & Plan:   Problem List Items Addressed This Visit    Anemia - Primary    Saw Dr Ma Hillock.  Had extensive w/up.  Follow cbc.       Relevant Medications   Ferrous Fumarate (HEMOCYTE - 106 MG FE) 324 (106 Fe) MG TABS tablet   Other Relevant Orders   CBC with Differential/Platelet (Completed)   Ferritin (Completed)   Barrett's esophagus    Followed by Dr Gustavo Lah.        CKD (chronic kidney disease) stage 3, GFR 30-59 ml/min    Recheck kidney function today.  Avoid antiinflammatories.        COPD with hypoxia ([place])    Followed by Dr Alva Garnet.  Breathing relatively stable.  Continue current regimen.  Feels better.       Essential hypertension    Blood pressure under good control.  Continue same medication regimen.  Follow pressures.  Follow metabolic panel.        Relevant Orders   Basic metabolic panel (Completed)   GERD (gastroesophageal reflux disease)    Controlled on prevacid.        Relevant Medications   docusate sodium (COLACE) 100 MG capsule   Hypercholesterolemia    On no cholesterol medication.  Follow lipid panel.  Low cholesterol diet.       Relevant Orders    Hepatic function panel (Completed)   Lipid panel (Completed)   Hyperglycemia    Low carb diet and exercise.  Follow met b and a1c.       Relevant Orders   Hemoglobin A1c (Completed)   Mitral regurgitation    TEE and cath 10/19/16 - normal PCWP and non obstructive CAD.  Mitral regurg - moderate.  Evaluated by Dr Jenne Pane ([place]).  Medical management.  Stable.         Other Visit Diagnoses    Encounter for Medicare annual wellness exam           [name], MD

## 2016-12-02 ENCOUNTER — Encounter: Payer: Self-pay | Admitting: Internal Medicine

## 2016-12-04 ENCOUNTER — Encounter: Payer: Self-pay | Admitting: Internal Medicine

## 2016-12-04 NOTE — Assessment & Plan Note (Signed)
On no cholesterol medication.  Follow lipid panel.  Low cholesterol diet.

## 2016-12-04 NOTE — Assessment & Plan Note (Signed)
Followed by Dr. Skulskie 

## 2016-12-04 NOTE — Assessment & Plan Note (Signed)
Low carb diet and exercise.  Follow met b and a1c.  

## 2016-12-04 NOTE — Assessment & Plan Note (Signed)
Recheck kidney function today.  Avoid antiinflammatories.

## 2016-12-04 NOTE — Assessment & Plan Note (Signed)
Blood pressure under good control.  Continue same medication regimen.  Follow pressures.  Follow metabolic panel.   

## 2016-12-04 NOTE — Assessment & Plan Note (Signed)
Followed by Dr Alva Garnet.  Breathing relatively stable.  Continue current regimen.  Feels better.

## 2016-12-04 NOTE — Assessment & Plan Note (Signed)
TEE and cath 10/19/16 - normal PCWP and non obstructive CAD.  Mitral regurg - moderate.  Evaluated by Dr Jenne Pane ([place]).  Medical management.  Stable.

## 2016-12-04 NOTE — Assessment & Plan Note (Signed)
Controlled on prevacid.

## 2016-12-04 NOTE — Assessment & Plan Note (Signed)
Saw Dr Ma Hillock.  Had extensive w/up.  Follow cbc.

## 2016-12-26 DIAGNOSIS — I272 Pulmonary hypertension, unspecified: Secondary | ICD-10-CM | POA: Insufficient documentation

## 2016-12-26 NOTE — Progress Notes (Signed)
Cardiology Office Note  Date:  12/28/2016   ID:  Anita Ferrell, DOB 1933/03/01, MRN [id number]  PCP:  [name], MD   Chief Complaint  Patient presents with  . other    Mitral valve insufficiency no complaints today. Meds reviewed verbally with pt.    HPI:  Ms. Anita Ferrell is a 83 y.o. female with a PMHX of  HTN,  COPD, smoker 83 yo quit 56 Moderate Mitral Regurg, In 2017 Chronic shortness of breath with exertion.  2015  PFT reports of an FEV1 of 1.1 L.  Nonobstructive coronary disease by catheterization July 2018 Moderately elevated right heart pressures with normal PCWP of 8, on Lasix daily Who presents by referral from Dr. Nicki Reaper for evaluation of her mitral valve regurgitation, shortness of breath  Records from [facility] reviewed with her in detail on today's visit Cath 10/2016 1.Cardiac index 2.2 L/m/m2 at rest with PCWP 8 mmHg. 2.Moderate pulmonary hypertension in the absence of elevated PCWP, PVR 6.0 Wu. 3.Non obstructive CAD Plan medical therapy of moderate MR and severe COPD   Previously followed by Dr Aline Brochure at [facility]   ECHO - mild to moderate left ventricular dysfunction with moderate to severe MR and moderate TR.    She sees Dr Alva Garnet for her COPD.    TEE: 1. Moderate Functional MR (EROA by PISA = 0.1) 2. Moderate TR 3. Mild PR 4. No interatrial septal communication 5. Mildly reduced LV function, LVEF approximately 45%  Lab work reviewed with creatinine 1.4  Denies any cough, sputum, PND orthopnea, no lower extremity edema  EKG personally reviewed by myself on todays visit Shows normal sinus rhythm rate 95 bpm, rare fusion beat, nonspecific ST and T wave abnormality   Pulmonary hypertension (HCC)     Right Ventricular systolic pressure at 60 mm Hg by echo on july of 2011; Right heart cardic catherization revealed pulmonary artery pressusre of 27/10 with a mean of; Ventricular pressure 29/10 with pulmonary capillary wedge at 9      PMH:   has a past medical history of Allergy; Asthma; COPD (chronic obstructive pulmonary disease) ([place]); GERD (gastroesophageal reflux disease); Hypercholesteremia; Hypertension; Mitral valve insufficiency; Pulmonary hypertension ([place]); and TB (pulmonary tuberculosis).  PSH:    Past Surgical History:  Procedure Laterality Date  . CARDIAC CATHETERIZATION  10/2016   [facility]  . CATARACT EXTRACTION Bilateral 2008  . CHOLECYSTECTOMY    . TONSILLECTOMY  1964    Current Outpatient Prescriptions  Medication Sig Dispense Refill  . acetaminophen (TYLENOL) 500 MG tablet Take 500 mg by mouth every 6 (six) hours as needed.    Marland Kitchen aspirin EC 81 MG tablet Take 81 mg by mouth daily.     Marland Kitchen azelastine (ASTELIN) 0.1 % nasal spray USE 1 SPRAY EACH NOSTRIL TWO TIMES DAILY 30 mL 3  . Calcium Carbonate-Vitamin D (CALCIUM 600+D) 600-400 MG-UNIT tablet Take 1 tablet by mouth at bedtime.    . docusate sodium (COLACE) 100 MG capsule Take by mouth.    . ferrous sulfate 325 (65 FE) MG tablet Take 325 mg by mouth 2 (two) times daily with a meal.     . fexofenadine (ALLEGRA) 180 MG tablet Take 180 mg by mouth daily.     . furosemide (LASIX) 40 MG tablet Take 40 mg by mouth every other day.    . lansoprazole (PREVACID) 30 MG capsule Take 30 mg by mouth daily.     . meclizine (ANTIVERT) 25 MG tablet Take 25 mg by mouth 3 (three)  times daily as needed for dizziness. Reported on 10/15/2015    . metoprolol (LOPRESSOR) 50 MG tablet Take 25 mg by mouth 2 (two) times daily.     . Multiple Vitamins-Minerals (CENTRUM SILVER PO) Take by mouth.    . Probiotic Product (ALIGN PO) Take by mouth.    . sacubitril-valsartan (ENTRESTO) 24-26 MG Take 1 tablet by mouth 2 (two) times daily.    Marland Kitchen spironolactone (ALDACTONE) 25 MG tablet Take 12.5 mg by mouth daily.     Marland Kitchen umeclidinium-vilanterol (ANORO ELLIPTA) 62.5-25 MCG/INH AEPB Inhale 1 puff into the lungs daily. 60 each 5  . VENTOLIN HFA 108 (90 Base) MCG/ACT inhaler INHALE TWO PUFFS  EVERY FOUR HOURS AS NEEDED. FOR WHEEZE/SHORTNESS OF BREATH 18 g 2   No current facility-administered medications for this visit.      Allergies:   Penicillins   Social History:  The patient  reports that she has quit smoking. She has a 20.00 pack-year smoking history. She has never used smokeless tobacco. She reports that she does not drink alcohol or use drugs.   Family History:   family history includes Breast cancer in her maternal aunt; Heart disease in her brother, father, and mother; Hypertension in her sister.    Review of Systems: Review of Systems  Constitutional: Positive for malaise/fatigue.  Respiratory: Positive for shortness of breath.   Cardiovascular: Negative.   Gastrointestinal: Negative.   Musculoskeletal: Negative.   Neurological: Negative.   Psychiatric/Behavioral: Negative.   All other systems reviewed and are negative.    PHYSICAL EXAM: VS:  BP (!) 142/80 (BP Location: Right Arm, Patient Position: Sitting, Cuff Size: Normal)   Pulse 95   Ht 5' 3.5" (1.613 m)   Wt 145 lb (65.8 kg)   BMI 25.28 kg/m  , BMI Body mass index is 25.28 kg/m. GEN: Well nourished, well developed, in no acute distress  HEENT: normal  Neck: no JVD, carotid bruits, or masses Cardiac: RRR; no murmurs, rubs, or gallops,no edema  Respiratory: Mildly decreased breath sounds throughout, normal work of breathing GI: soft, nontender, nondistended, + BS MS: no deformity or atrophy  Skin: warm and dry, no rash Neuro:  Strength and sensation are intact Psych: euthymic mood, full affect    Recent Labs: 06/17/2016: TSH 1.09 12/01/2016: ALT 13; BUN 19; Creatinine, Ser 1.28; Hemoglobin 11.6; Platelets 182.0; Potassium 5.0; Sodium 142    Lipid Panel Lab Results  Component Value Date   CHOL 180 12/01/2016   HDL 42.00 12/01/2016   LDLCALC 106 (H) 12/01/2016   TRIG 161.0 (H) 12/01/2016      Wt Readings from Last 3 Encounters:  12/28/16 145 lb (65.8 kg)  12/01/16 143 lb 3.2 oz (65  kg)  11/23/16 142 lb (64.4 kg)       ASSESSMENT AND PLAN:  Pulmonary HTN ([place]) Moderately elevated dating back 7 years Limited in her exertional capacity. Gentle diuresis with close monitoring of renal function  COPD with hypoxia (HCC) Followed by Dr. Alva Garnet, pulmonary Stop smoking many years ago, total of 40 years On several inhalers  CKD (chronic kidney disease) stage 3, GFR 30-59 ml/min Creatinine 1.4 Stressed the importance that we try to minimize overdiuresis  Shortness of breath - Plan: EKG 12-Lead Likely multifactorial including underlying COPD, pulmonary hypertension/moderate MR, as well as obesity and deconditioning. Recommended she try to restart her treadmill walking program  Moderate mitral regurgitation - Plan: EKG 12-Lead Given her relatively stable appearance, does not seem to require aggressive surgical intervention at  this time. We did touch on mitral valve clip and other technologies currently in clinical trials  Disposition:   F/U  6 months   Total encounter time more than 45 minutes  Greater than 50% was spent in counseling and coordination of care with the patient    Orders Placed This Encounter  Procedures  . EKG 12-Lead     Signed, Esmond Plants, M.D., Ph.D. 12/28/2016  [place], [place]

## 2016-12-28 ENCOUNTER — Ambulatory Visit (INDEPENDENT_AMBULATORY_CARE_PROVIDER_SITE_OTHER): Payer: Medicare Other | Admitting: Cardiovascular Disease

## 2016-12-28 ENCOUNTER — Encounter: Payer: Self-pay | Admitting: Cardiovascular Disease

## 2016-12-28 VITALS — BP 142/80 | HR 95 | Ht 63.5 in | Wt 145.0 lb

## 2016-12-28 DIAGNOSIS — J449 Chronic obstructive pulmonary disease, unspecified: Secondary | ICD-10-CM

## 2016-12-28 DIAGNOSIS — N183 Chronic kidney disease, stage 3 unspecified: Secondary | ICD-10-CM

## 2016-12-28 DIAGNOSIS — I340 Nonrheumatic mitral (valve) insufficiency: Secondary | ICD-10-CM

## 2016-12-28 DIAGNOSIS — R0602 Shortness of breath: Secondary | ICD-10-CM

## 2016-12-28 DIAGNOSIS — R0902 Hypoxemia: Secondary | ICD-10-CM | POA: Diagnosis not present

## 2016-12-28 DIAGNOSIS — I272 Pulmonary hypertension, unspecified: Secondary | ICD-10-CM | POA: Diagnosis not present

## 2016-12-28 MED ORDER — METOPROLOL TARTRATE 50 MG PO TABS: 25.0000 mg | ORAL_TABLET | Freq: Two times a day (BID) | ORAL | 11 refills | Status: DC

## 2016-12-28 MED ORDER — SPIRONOLACTONE 25 MG PO TABS: 12.5000 mg | ORAL_TABLET | Freq: Every day | ORAL | 11 refills | Status: DC

## 2016-12-28 MED ORDER — FUROSEMIDE 40 MG PO TABS: 40.0000 mg | ORAL_TABLET | ORAL | 11 refills | Status: DC

## 2016-12-28 MED ORDER — SACUBITRIL-VALSARTAN 24-26 MG PO TABS: 1.0000 | ORAL_TABLET | Freq: Two times a day (BID) | ORAL | 11 refills | Status: DC

## 2016-12-28 NOTE — Patient Instructions (Signed)

## 2017-01-25 ENCOUNTER — Ambulatory Visit (INDEPENDENT_AMBULATORY_CARE_PROVIDER_SITE_OTHER): Payer: Medicare Other

## 2017-01-25 DIAGNOSIS — Z23 Encounter for immunization: Secondary | ICD-10-CM

## 2017-03-28 ENCOUNTER — Ambulatory Visit: Payer: PRIVATE HEALTH INSURANCE | Admitting: Internal Medicine

## 2017-03-29 ENCOUNTER — Ambulatory Visit: Payer: PRIVATE HEALTH INSURANCE | Admitting: Pulmonary Disease

## 2017-05-10 ENCOUNTER — Ambulatory Visit (INDEPENDENT_AMBULATORY_CARE_PROVIDER_SITE_OTHER): Payer: Medicare Other | Admitting: Pulmonary Disease

## 2017-05-10 ENCOUNTER — Encounter: Payer: Self-pay | Admitting: Pulmonary Disease

## 2017-05-10 VITALS — BP 124/80 | HR 99 | Ht 63.5 in | Wt 144.0 lb

## 2017-05-10 DIAGNOSIS — J449 Chronic obstructive pulmonary disease, unspecified: Secondary | ICD-10-CM

## 2017-05-10 NOTE — Progress Notes (Signed)
PULMONARY OFFICE FOLLOW UP NOTE  Requesting MD/Service: Self Date of initial consultation: 06/20/15 Reason for consultation: COPD, lung nodule  PROBLEMS:  Moderate COPD/emphysema Lung nodule -decreased in size, no further follow-up H/O positive PPD - S/P 9 months INH.   Data: CT chest 07/29/15:Significant decrease in size of the previously demonstrated right lower lobe nodule, compatible with a benign process. No significant change in multiple additional sub cm nodules and calcified granulomata in both lungs, compatible with a benign process. Mild changes of COPD and chronic bronchitis. Resolved infection in the lingula and left lower lobe PFTs 07/29/15: moderate obstruction, normal lung volumes, mild reduction DLCO.  TEE 10/19/16: LVEF 45%. Moderate mitral regurgitation [facility] 10/25/16: Nonobstructive coronary disease. Moderate pulmonary hypertension (systolic 50 mmHg, mean 30 mmHg)  SUBJ: This is a routine reevaluation.  Last seen August 2018.  No major events since then.  Last visit, we switched her from Spiriva to Anoro.  She believes that her exercise tolerance has improved.  She uses her albuterol rescue inhaler 0-1 time per day. Denes CP, fever, purulent sputum, hemoptysis, LE edema and calf tenderness  ONJ: Vitals:   05/10/17 1023 05/10/17 1025  BP:  124/80  Pulse:  99  SpO2:  96%  Weight: 65.3 kg (144 lb)   Height: 5' 3.5" (1.613 m)    EXAM:  Gen: No overt distress HEENT: NCAT, WNL Neck: Supple without, JVD Lungs: breath sounds mildly diminished, No wheezes Cardiovascular: Frequent extrasystoles, no murmur Abdomen: Soft, nontender, normal BS Ext: without clubbing, cyanosis, edema  DATA:   BMP Latest Ref Rng & Units 12/01/2016 06/28/2016 06/17/2016  Glucose 70 - 99 mg/dL 112(H) 100(H) 111(H)  BUN 6 - 23 mg/dL 19 16 14   Creatinine 0.40 - 1.20 mg/dL 1.28(H) 1.41(H) 1.28(H)  Sodium 135 - 145 mEq/L 142 143 140  Potassium 3.5 - 5.1 mEq/L 5.0 4.6 4.7  Chloride 96 - 112 mEq/L  102 103 103  CO2 19 - 32 mEq/L 34(H) 27 31  Calcium 8.4 - 10.5 mg/dL 10.0 9.3 9.8    CBC Latest Ref Rng & Units 12/01/2016 06/17/2016 03/02/2016  WBC 4.0 - 10.5 K/uL 3.7(L) 4.6 3.9(L)  Hemoglobin 12.0 - 15.0 g/dL 11.6(L) 11.4(L) 11.1(L)  Hematocrit 36.0 - 46.0 % 36.0 35.1(L) 34.4(L)  Platelets 150.0 - 400.0 K/uL 182.0 197.0 185.0    CXR: NNF   IMPRESSION:   COPD, moderate - Reports benefit from albuterol  DOE - multifactorial. Components of both pulmonary and cardiac etiologies.  Improved with change from Spiriva to Anoro   PLAN:  Continue Anoro inhaler, one inhalation daily Cont PRN albuterol as needed for increased SOB, wheezing or chest tightness ROV in 6 months or as needed   Merton Border, MD PCCM service Mobile [phone] Pager [phone] 05/10/2017 10:32 AM

## 2017-05-10 NOTE — Patient Instructions (Signed)
Continue Anoro inhaler daily Continue albuterol inhaler as needed Follow-up in 6 months or sooner as needed

## 2017-05-31 ENCOUNTER — Encounter: Payer: Self-pay | Admitting: Internal Medicine

## 2017-05-31 ENCOUNTER — Ambulatory Visit (INDEPENDENT_AMBULATORY_CARE_PROVIDER_SITE_OTHER): Payer: Medicare Other | Admitting: Internal Medicine

## 2017-05-31 VITALS — BP 112/60 | HR 97 | Temp 97.5°F | Resp 18 | Wt 146.4 lb

## 2017-05-31 DIAGNOSIS — R739 Hyperglycemia, unspecified: Secondary | ICD-10-CM | POA: Diagnosis not present

## 2017-05-31 DIAGNOSIS — Z1231 Encounter for screening mammogram for malignant neoplasm of breast: Secondary | ICD-10-CM

## 2017-05-31 DIAGNOSIS — R0902 Hypoxemia: Secondary | ICD-10-CM

## 2017-05-31 DIAGNOSIS — J449 Chronic obstructive pulmonary disease, unspecified: Secondary | ICD-10-CM | POA: Diagnosis not present

## 2017-05-31 DIAGNOSIS — K2100 Gastro-esophageal reflux disease with esophagitis, without bleeding: Secondary | ICD-10-CM

## 2017-05-31 DIAGNOSIS — N183 Chronic kidney disease, stage 3 unspecified: Secondary | ICD-10-CM

## 2017-05-31 DIAGNOSIS — I10 Essential (primary) hypertension: Secondary | ICD-10-CM

## 2017-05-31 DIAGNOSIS — D649 Anemia, unspecified: Secondary | ICD-10-CM

## 2017-05-31 DIAGNOSIS — J9601 Acute respiratory failure with hypoxia: Secondary | ICD-10-CM

## 2017-05-31 DIAGNOSIS — Z1239 Encounter for other screening for malignant neoplasm of breast: Secondary | ICD-10-CM

## 2017-05-31 DIAGNOSIS — E7800 Pure hypercholesterolemia, unspecified: Secondary | ICD-10-CM | POA: Diagnosis not present

## 2017-05-31 DIAGNOSIS — I272 Pulmonary hypertension, unspecified: Secondary | ICD-10-CM

## 2017-05-31 DIAGNOSIS — K2270 Barrett's esophagus without dysplasia: Secondary | ICD-10-CM

## 2017-05-31 DIAGNOSIS — I340 Nonrheumatic mitral (valve) insufficiency: Secondary | ICD-10-CM

## 2017-05-31 DIAGNOSIS — K625 Hemorrhage of anus and rectum: Secondary | ICD-10-CM | POA: Diagnosis not present

## 2017-05-31 LAB — HEPATIC FUNCTION PANEL
ALK PHOS: 65 U/L (ref 39–117)
ALT: 11 U/L (ref 0–35)
AST: 16 U/L (ref 0–37)
Albumin: 3.8 g/dL (ref 3.5–5.2)
Bilirubin, Direct: 0.1 mg/dL (ref 0.0–0.3)
Total Bilirubin: 0.5 mg/dL (ref 0.2–1.2)
Total Protein: 5.9 g/dL — ABNORMAL LOW (ref 6.0–8.3)

## 2017-05-31 LAB — CBC WITH DIFFERENTIAL/PLATELET
Basophils Absolute: 0 10*3/uL (ref 0.0–0.1)
Basophils Relative: 0.4 % (ref 0.0–3.0)
EOS PCT: 4.6 % (ref 0.0–5.0)
Eosinophils Absolute: 0.2 10*3/uL (ref 0.0–0.7)
HCT: 34.6 % — ABNORMAL LOW (ref 36.0–46.0)
HEMOGLOBIN: 11.3 g/dL — AB (ref 12.0–15.0)
LYMPHS ABS: 1 10*3/uL (ref 0.7–4.0)
Lymphocytes Relative: 30.1 % (ref 12.0–46.0)
MCHC: 32.6 g/dL (ref 30.0–36.0)
MCV: 86 fl (ref 78.0–100.0)
MONO ABS: 0.2 10*3/uL (ref 0.1–1.0)
Monocytes Relative: 7.5 % (ref 3.0–12.0)
Neutro Abs: 1.9 10*3/uL (ref 1.4–7.7)
Neutrophils Relative %: 57.4 % (ref 43.0–77.0)
Platelets: 175 10*3/uL (ref 150.0–400.0)
RBC: 4.03 Mil/uL (ref 3.87–5.11)
RDW: 13.7 % (ref 11.5–15.5)
WBC: 3.3 10*3/uL — AB (ref 4.0–10.5)

## 2017-05-31 LAB — BASIC METABOLIC PANEL
BUN: 16 mg/dL (ref 6–23)
CALCIUM: 9.6 mg/dL (ref 8.4–10.5)
CO2: 34 meq/L — AB (ref 19–32)
CREATININE: 1.4 mg/dL — AB (ref 0.40–1.20)
Chloride: 103 mEq/L (ref 96–112)
GFR: 38.16 mL/min — AB (ref >60.00)
GLUCOSE: 111 mg/dL — AB (ref 70–99)
Potassium: 4.6 mEq/L (ref 3.5–5.1)
Sodium: 143 mEq/L (ref 135–145)

## 2017-05-31 LAB — LIPID PANEL
CHOL/HDL RATIO: 5
CHOLESTEROL: 182 mg/dL (ref 0–200)
HDL: 39.4 mg/dL (ref >39.00)
LDL Cholesterol: 113 mg/dL — ABNORMAL HIGH (ref 0–99)
NonHDL: 142.54
TRIGLYCERIDES: 150 mg/dL — AB (ref 0.0–149.0)
VLDL: 30 mg/dL (ref 0.0–40.0)

## 2017-05-31 LAB — FERRITIN: FERRITIN: 149.7 ng/mL (ref 10.0–291.0)

## 2017-05-31 LAB — HEMOGLOBIN A1C: Hgb A1c MFr Bld: 6 % (ref 4.6–6.5)

## 2017-05-31 LAB — TSH: TSH: 1.19 u[IU]/mL (ref 0.35–4.50)

## 2017-05-31 NOTE — Progress Notes (Signed)
Patient ID: Anita Ferrell, female   DOB: November 23, 1932, 84 y.o.   MRN: [id number]   Subjective:    Patient ID: Anita Ferrell, female    DOB: 1932-08-26, 84 y.o.   MRN: [id number]  HPI  Patient here for a scheduled follow up.  Had recent cardiac w/up at [facility] - Dr Aline Brochure.  Had TEE - moderate MR, moderate TR and mild PR with mildly reduced EF - 45%.  PCWP - 8.  Had f/u with Dr Rockey Situ 12/2016.  Note reviewed.  Felt stable and recommended continuing her current medication regimen.  Recommended f/u in 6 months.  She also saw Dr Alva Garnet 05/10/17.  Recommended continuing Anoro and prn albuterol.  She states her breathing has improved some.  She does feel better.  Still with some limitations, but has improved.  No chest pain.  No acid reflux. No abdominal pain.  Bowels moving.     Past Medical History:  Diagnosis Date  . Allergy   . Asthma   . COPD (chronic obstructive pulmonary disease) ([place])   . GERD (gastroesophageal reflux disease)   . Hypercholesteremia   . Hypertension   . Mitral valve insufficiency   . Pulmonary hypertension (HCC)    Right Ventricular systolic pressure at 60 mm Hg by echo on july of 2011; Right heart cardic catherization revealed pulmonary artery pressusre of 27/10 with a mean of; Ventricular pressure 29/10 with pulmonary capillary wedge at 9  . TB (pulmonary tuberculosis)    treated with INH therapy    Past Surgical History:  Procedure Laterality Date  . CARDIAC CATHETERIZATION  10/2016   [facility]  . CATARACT EXTRACTION Bilateral 2008  . CHOLECYSTECTOMY    . TONSILLECTOMY  1964   Family History  Problem Relation Age of Onset  . Heart disease Mother   . Heart disease Father   . Hypertension Sister   . Heart disease Brother   . Breast cancer Maternal Aunt    Social History   Socioeconomic History  . Marital status: Widowed    Spouse name: None  . Number of children: 3  . Years of education: None  . Highest education level: None  Social Needs  .  Financial resource strain: None  . Food insecurity - worry: None  . Food insecurity - inability: None  . Transportation needs - medical: None  . Transportation needs - non-medical: None  Occupational History  . None  Tobacco Use  . Smoking status: Former Smoker    Packs/day: 0.50    Years: 40.00    Pack years: 20.00  . Smokeless tobacco: Never Used  . Tobacco comment: Quit in 1992  Substance and Sexual Activity  . Alcohol use: No    Alcohol/week: 0.0 oz  . Drug use: No  . Sexual activity: No  Other Topics Concern  . None  Social History Narrative  . None    Outpatient Encounter Medications as of 05/31/2017  Medication Sig  . acetaminophen (TYLENOL) 500 MG tablet Take 500 mg by mouth every 6 (six) hours as needed.  Marland Kitchen aspirin EC 81 MG tablet Take 81 mg by mouth daily.   Marland Kitchen azelastine (ASTELIN) 0.1 % nasal spray USE 1 SPRAY EACH NOSTRIL TWO TIMES DAILY  . Calcium Carbonate-Vitamin D (CALCIUM 600+D) 600-400 MG-UNIT tablet Take 1 tablet by mouth at bedtime.  . docusate sodium (COLACE) 100 MG capsule Take by mouth.  . ferrous sulfate 325 (65 FE) MG tablet Take 325 mg by mouth 2 (two) times daily  with a meal.   . fexofenadine (ALLEGRA) 180 MG tablet Take 180 mg by mouth daily.   . furosemide (LASIX) 40 MG tablet Take 1 tablet (40 mg total) by mouth every other day.  . lansoprazole (PREVACID) 30 MG capsule Take 30 mg by mouth daily.   . meclizine (ANTIVERT) 25 MG tablet Take 25 mg by mouth 3 (three) times daily as needed for dizziness. Reported on 10/15/2015  . metoprolol tartrate (LOPRESSOR) 50 MG tablet Take 0.5 tablets (25 mg total) by mouth 2 (two) times daily.  . Multiple Vitamins-Minerals (CENTRUM SILVER PO) Take by mouth.  . Probiotic Product (ALIGN PO) Take by mouth.  . sacubitril-valsartan (ENTRESTO) 24-26 MG Take 1 tablet by mouth 2 (two) times daily.  Marland Kitchen spironolactone (ALDACTONE) 25 MG tablet Take 0.5 tablets (12.5 mg total) by mouth daily.  Marland Kitchen umeclidinium-vilanterol (ANORO  ELLIPTA) 62.5-25 MCG/INH AEPB Inhale 1 puff into the lungs daily.  . VENTOLIN HFA 108 (90 Base) MCG/ACT inhaler INHALE TWO PUFFS EVERY FOUR HOURS AS NEEDED. FOR WHEEZE/SHORTNESS OF BREATH   No facility-administered encounter medications on file as of 05/31/2017.     Review of Systems  Constitutional: Negative for appetite change and unexpected weight change.  HENT: Negative for congestion and sinus pressure.   Respiratory: Negative for cough and chest tightness.        Breathing overall stable to improved.    Cardiovascular: Negative for chest pain, palpitations and leg swelling.  Gastrointestinal: Negative for abdominal pain, diarrhea, nausea and vomiting.  Genitourinary: Negative for difficulty urinating and dysuria.  Musculoskeletal: Negative for joint swelling and myalgias.  Skin: Negative for color change and rash.  Neurological: Negative for dizziness, light-headedness and headaches.  Psychiatric/Behavioral: Negative for agitation and dysphoric mood.       Objective:    Physical Exam  Constitutional: She appears well-developed and well-nourished. No distress.  HENT:  Nose: Nose normal.  Mouth/Throat: Oropharynx is clear and moist.  Neck: Neck supple. No thyromegaly present.  Cardiovascular: Normal rate and regular rhythm.  Pulmonary/Chest: Breath sounds normal. No respiratory distress. She has no wheezes.  Abdominal: Soft. Bowel sounds are normal. There is no tenderness.  Musculoskeletal: She exhibits no edema or tenderness.  Lymphadenopathy:    She has no cervical adenopathy.  Skin: No rash noted. No erythema.  Psychiatric: She has a normal mood and affect. Her behavior is normal.    BP 112/60 (BP Location: Left Arm, Patient Position: Sitting, Cuff Size: Normal)   Pulse 97   Temp (!) 97.5 F (36.4 C) (Oral)   Resp 18   Wt 146 lb 6.4 oz (66.4 kg)   SpO2 94%   BMI 25.53 kg/m  Wt Readings from Last 3 Encounters:  05/31/17 146 lb 6.4 oz (66.4 kg)  05/10/17 144 lb  (65.3 kg)  12/28/16 145 lb (65.8 kg)     Lab Results  Component Value Date   WBC 3.3 (L) 05/31/2017   HGB 11.3 (L) 05/31/2017   HCT 34.6 (L) 05/31/2017   PLT 175.0 05/31/2017   GLUCOSE 111 (H) 05/31/2017   CHOL 182 05/31/2017   TRIG 150.0 (H) 05/31/2017   HDL 39.40 05/31/2017   LDLDIRECT 127.1 08/25/2012   LDLCALC 113 (H) 05/31/2017   ALT 11 05/31/2017   AST 16 05/31/2017   NA 143 05/31/2017   K 4.6 05/31/2017   CL 103 05/31/2017   CREATININE 1.40 (H) 05/31/2017   BUN 16 05/31/2017   CO2 34 (H) 05/31/2017   TSH 1.19 05/31/2017  HGBA1C 6.0 05/31/2017    Mm Screening Breast Tomo Bilateral  Result Date: 07/22/2016 CLINICAL DATA:  Screening. EXAM: 2D DIGITAL SCREENING BILATERAL MAMMOGRAM WITH CAD AND ADJUNCT TOMO COMPARISON:  Previous exam(s). ACR Breast Density Category b: There are scattered areas of fibroglandular density. FINDINGS: There are no findings suspicious for malignancy. Images were processed with CAD. IMPRESSION: No mammographic evidence of malignancy. A result letter of this screening mammogram will be mailed directly to the patient. RECOMMENDATION: Screening mammogram in one year. (Code:SM-B-01Y) BI-RADS CATEGORY  1: Negative. Electronically Signed   By: Pamelia Hoit M.D.   On: 07/22/2016 16:44       Assessment & Plan:   Problem List Items Addressed This Visit    Acute respiratory failure with hypoxemia ([place])    No acute respiratory failure.  Had cardiac and pulmonary w/up.  Doing well on current regimen.  Follow.        Anemia - Primary    Saw Dr Ma Hillock.  Had extensive w/up.  Follow cbc.       Relevant Orders   CBC with Differential/Platelet (Completed)   Ferritin (Completed)   Barrett's esophagus    Followed by Dr Gustavo Lah.       CKD (chronic kidney disease) stage 3, GFR 30-59 ml/min (HCC)    Avoid antiinflammatories.  Recheck metabolic panel.       COPD with hypoxia ([place])    Followed by Dr Alva Garnet.  Breathing stable/improved.  Continue current  regimen.        Essential hypertension    Blood pressure under good control.  Continue same medication regimen.  Follow pressures.  Follow metabolic panel.        Relevant Orders   TSH (Completed)   Basic metabolic panel (Completed)   GERD (gastroesophageal reflux disease)    Controlled on current regimen.       Hypercholesterolemia    Follow lipid panel.  Low cholesterol diet and exercise.        Relevant Orders   Hepatic function panel (Completed)   Lipid panel (Completed)   Hyperglycemia    Low carb diet and exercise.  Follow met b and a1c.       Relevant Orders   Hemoglobin A1c (Completed)   Moderate mitral regurgitation    TEE and cath as outlined.  Currently doing well on current regimen.  Saw Dr Rockey Situ 12/2016.  No further intervention felt warranted at this time.  Follow.       Pulmonary HTN ([place])    Recent PCWP - 8.  Followed by cardiology and pulmonary.  Doing well on current regimen.        Rectal bleeding    Still with bleeding hemorrhoids per her report.  Have referred to surgery.  She wants to hold on any further intervention at this time.  Follow.        Other Visit Diagnoses    Breast cancer screening       Relevant Orders   MM Digital Screening       [name], MD

## 2017-06-01 ENCOUNTER — Other Ambulatory Visit: Payer: Self-pay | Admitting: Internal Medicine

## 2017-06-01 DIAGNOSIS — N183 Chronic kidney disease, stage 3 unspecified: Secondary | ICD-10-CM

## 2017-06-01 DIAGNOSIS — D72819 Decreased white blood cell count, unspecified: Secondary | ICD-10-CM

## 2017-06-01 NOTE — Progress Notes (Signed)
Order placed for f/u labs.  

## 2017-06-02 ENCOUNTER — Encounter: Payer: Self-pay | Admitting: Internal Medicine

## 2017-06-02 DIAGNOSIS — J9601 Acute respiratory failure with hypoxia: Secondary | ICD-10-CM | POA: Insufficient documentation

## 2017-06-02 NOTE — Assessment & Plan Note (Signed)
Followed by Dr. Skulskie 

## 2017-06-02 NOTE — Assessment & Plan Note (Signed)
Avoid antiinflammatories.  Recheck metabolic panel.  

## 2017-06-02 NOTE — Assessment & Plan Note (Signed)
Low carb diet and exercise.  Follow met b and a1c.

## 2017-06-02 NOTE — Assessment & Plan Note (Signed)
Recent PCWP - 8.  Followed by cardiology and pulmonary.  Doing well on current regimen.

## 2017-06-02 NOTE — Assessment & Plan Note (Signed)
Controlled on current regimen.   

## 2017-06-02 NOTE — Assessment & Plan Note (Signed)
No acute respiratory failure.  Had cardiac and pulmonary w/up.  Doing well on current regimen.  Follow.

## 2017-06-02 NOTE — Assessment & Plan Note (Signed)
Saw Dr Ma Hillock.  Had extensive w/up.  Follow cbc.

## 2017-06-02 NOTE — Assessment & Plan Note (Signed)
Blood pressure under good control.  Continue same medication regimen.  Follow pressures.  Follow metabolic panel.   

## 2017-06-02 NOTE — Assessment & Plan Note (Signed)
Still with bleeding hemorrhoids per her report.  Have referred to surgery.  She wants to hold on any further intervention at this time.  Follow.

## 2017-06-02 NOTE — Assessment & Plan Note (Signed)
Followed by Dr Alva Garnet.  Breathing stable/improved.  Continue current regimen.

## 2017-06-02 NOTE — Assessment & Plan Note (Signed)
Follow lipid panel.  Low cholesterol diet and exercise.   

## 2017-06-02 NOTE — Assessment & Plan Note (Signed)
TEE and cath as outlined.  Currently doing well on current regimen.  Saw Dr Rockey Situ 12/2016.  No further intervention felt warranted at this time.  Follow.

## 2017-06-03 ENCOUNTER — Telehealth: Payer: Self-pay | Admitting: *Deleted

## 2017-06-03 ENCOUNTER — Telehealth: Payer: Self-pay | Admitting: Internal Medicine

## 2017-06-03 MED ORDER — ROSUVASTATIN CALCIUM 10 MG PO TABS: 10.0000 mg | ORAL_TABLET | Freq: Every day | ORAL | 1 refills | Status: DC

## 2017-06-03 NOTE — Telephone Encounter (Signed)
Lab scheduled °

## 2017-06-03 NOTE — Telephone Encounter (Signed)
Attempted to call patient back to schedule her lab appointment but no answer and not able to leave a message.

## 2017-06-03 NOTE — Telephone Encounter (Signed)
-----   Message from Curlene Labrum, RN sent at 06/03/2017 11:26 AM EST ----- Pt returned call and lab results went over with her. She voiced understanding of each one. She is agreeable to taking the Crestor.

## 2017-06-15 ENCOUNTER — Other Ambulatory Visit: Payer: Self-pay | Admitting: Pulmonary Disease

## 2017-06-26 NOTE — Progress Notes (Signed)
Cardiology Office Note  Date:  06/28/2017   ID:  Anita Ferrell, DOB 10/26/1932, MRN [id number]  PCP:  [name], MD   Chief Complaint  Patient presents with  . OTHER    6 month f/u coughing with phelgm and sob. Med reviewed verbally with pt.    HPI:  Anita Ferrell is a 84 y.o. female with a PMHX of  Pulmonary hypertension HTN,  COPD, smoker 84 yo quit 56 Moderate Mitral Regurg, moderate tricuspid valve regurgitation  chronic shortness of breath with exertion.  2015  PFT reports of an FEV1 of 1.1 L.  Nonobstructive coronary disease by catheterization July 2018 Moderately elevated right heart pressures with normal PCWP of 8, by right heart catheterization , on Lasix every other day Who presents for mitral valve regurgitation, shortness of breath, pulm HTN  Lasix every other day Feels that everything is stable, denies any change in her shortness of breath Shortness of breath is chronic mild, chronic low-grade cough Managed by Dr. Alva Garnet, pulmonary On inhalers Denies any lower extremity edema, abdominal bloating, PND orthopnea No significant change in her weight Cough is chronic, sometimes productive, no change through the winter  [facility] personally reviewed by myself on todays visit Normal sinus rhythm rate 82 bpm PVCs  Other past medical history reviewed Records from [place] reviewed with her in detail on today's visit Cath 10/2016 1.Cardiac index 2.2 L/m/m2 at rest with PCWP 8 mmHg. 2.Moderate pulmonary hypertension in the absence of elevated PCWP, PVR 6.0 Wu. 3.Non obstructive CAD Plan medical therapy of moderate MR and severe COPD   Previously followed by Dr Aline Brochure at [facility].   ECHO - mild to moderate left ventricular dysfunction with moderate to severe MR and moderate TR.    TEE: 1. Moderate Functional MR (EROA by PISA = 0.1) 2. Moderate TR 3. Mild PR 4. No interatrial septal communication 5. Mildly reduced LV function, LVEF approximately  45%  Lab work reviewed with creatinine 1.4   Pulmonary hypertension (HCC)     Right Ventricular systolic pressure at 60 mm Hg by echo on july of 2011; Right heart cardic catherization revealed pulmonary artery pressusre of 27/10 with a mean of; Ventricular pressure 29/10 with pulmonary capillary wedge at 9     PMH:   has a past medical history of Allergy, Asthma, COPD (chronic obstructive pulmonary disease) ([place]), GERD (gastroesophageal reflux disease), Hypercholesteremia, Hypertension, Mitral valve insufficiency, Pulmonary hypertension ([place]), and TB (pulmonary tuberculosis).  PSH:    Past Surgical History:  Procedure Laterality Date  . CARDIAC CATHETERIZATION  10/2016   [facility]  . CATARACT EXTRACTION Bilateral 2008  . CHOLECYSTECTOMY    . TONSILLECTOMY  1964    Current Outpatient Medications  Medication Sig Dispense Refill  . acetaminophen (TYLENOL) 500 MG tablet Take 500 mg by mouth every 6 (six) hours as needed.    Jearl Klinefelter ELLIPTA 62.5-25 MCG/INH AEPB TAKE 1 PUFF BY MOUTH INTO THE LUNGS DAILY 60 each 4  . aspirin EC 81 MG tablet Take 81 mg by mouth daily.     Marland Kitchen azelastine (ASTELIN) 0.1 % nasal spray USE 1 SPRAY EACH NOSTRIL TWO TIMES DAILY 30 mL 3  . Calcium Carbonate-Vitamin D (CALCIUM 600+D) 600-400 MG-UNIT tablet Take 1 tablet by mouth at bedtime.    . docusate sodium (COLACE) 100 MG capsule Take by mouth.    . ferrous sulfate 325 (65 FE) MG tablet Take 325 mg by mouth 2 (two) times daily with a meal.     .  fexofenadine (ALLEGRA) 180 MG tablet Take 180 mg by mouth daily.     . furosemide (LASIX) 40 MG tablet Take 1 tablet (40 mg total) by mouth every other day. 30 tablet 11  . lansoprazole (PREVACID) 30 MG capsule Take 30 mg by mouth daily.     . meclizine (ANTIVERT) 25 MG tablet Take 25 mg by mouth 3 (three) times daily as needed for dizziness. Reported on 10/15/2015    . metoprolol tartrate (LOPRESSOR) 50 MG tablet Take 0.5 tablets (25 mg total) by mouth 2 (two) times daily.  60 tablet 11  . Multiple Vitamins-Minerals (CENTRUM SILVER PO) Take by mouth.    . Probiotic Product (ALIGN PO) Take by mouth.    . rosuvastatin (CRESTOR) 10 MG tablet Take 1 tablet (10 mg total) by mouth daily. 90 tablet 1  . sacubitril-valsartan (ENTRESTO) 24-26 MG Take 1 tablet by mouth 2 (two) times daily. 60 tablet 11  . spironolactone (ALDACTONE) 25 MG tablet Take 0.5 tablets (12.5 mg total) by mouth daily. 30 tablet 11  . VENTOLIN HFA 108 (90 Base) MCG/ACT inhaler INHALE TWO PUFFS EVERY FOUR HOURS AS NEEDED. FOR WHEEZE/SHORTNESS OF BREATH 18 g 2   No current facility-administered medications for this visit.      Allergies:   Penicillin g and Penicillins   Social History:  The patient  reports that she has quit smoking. She has a 20.00 pack-year smoking history. she has never used smokeless tobacco. She reports that she does not drink alcohol or use drugs.   Family History:   family history includes Breast cancer in her maternal aunt; Heart disease in her brother, father, and mother; Hypertension in her sister.    Review of Systems: Review of Systems  Constitutional: Negative.   Respiratory: Positive for shortness of breath.   Cardiovascular: Negative.   Gastrointestinal: Negative.   Musculoskeletal: Negative.   Neurological: Negative.   Psychiatric/Behavioral: Negative.   All other systems reviewed and are negative.    PHYSICAL EXAM: VS:  BP 120/70 (BP Location: Left Arm, Patient Position: Sitting, Cuff Size: Normal)   Pulse 82   Ht 5\' 4"  (1.626 m)   Wt 145 lb 4 oz (65.9 kg)   BMI 24.93 kg/m  , BMI Body mass index is 24.93 kg/m. Constitutional:  oriented to person, place, and time. No distress.  HENT:  Head: Normocephalic and atraumatic.  Eyes:  no discharge. No scleral icterus.  Neck: Normal range of motion. Neck supple. No JVD present.  Cardiovascular: Normal rate, regular rhythm, normal heart sounds and intact distal pulses. Exam reveals no gallop and no  friction rub. No edema 2/6 SEM LSB Pulmonary/Chest: Mildly decreased breath sounds throughout, no stridor. No respiratory distress.  no wheezes.  no rales.  no tenderness.  Abdominal: Soft.  no distension.  no tenderness.  Musculoskeletal: Normal range of motion.  no  tenderness or deformity.  Neurological:  normal muscle tone. Coordination normal. No atrophy Skin: Skin is warm and dry. No rash noted. not diaphoretic.  Psychiatric:  normal mood and affect. behavior is normal. Thought content normal.   Recent Labs: 05/31/2017: ALT 11; BUN 16; Creatinine, Ser 1.40; Hemoglobin 11.3; Platelets 175.0; Potassium 4.6; Sodium 143; TSH 1.19    Lipid Panel Lab Results  Component Value Date   CHOL 182 05/31/2017   HDL 39.40 05/31/2017   LDLCALC 113 (H) 05/31/2017   TRIG 150.0 (H) 05/31/2017      Wt Readings from Last 3 Encounters:  06/28/17 145 lb 4  oz (65.9 kg)  05/31/17 146 lb 6.4 oz (66.4 kg)  05/10/17 144 lb (65.3 kg)       ASSESSMENT AND PLAN:  Pulmonary HTN ([place]) Moderately elevated dating back 7 years Gentle diuresis with close monitoring of renal function Would recommend we continue Lasix every other day On Lasix daily she had worsening renal dysfunction She feels comfortable with her current regiment Options for pulmonary hypertension are limited given underlying COPD Review of previous transesophageal echo shows normal RV size and function No urgency in pursuing aggressive treatments  COPD with hypoxia ([place]) Followed by Dr. Alva Garnet, pulmonary Stopped smoking many years ago, total of 40 years On several inhalers Stable, chronic bronchitic cough  CKD (chronic kidney disease) stage 3, GFR 30-59 ml/min Stable renal function, worse with over diuresis Stressed the importance that we try to minimize overdiuresis Lasix every other day, she feels comfortable with this dosing She will call us for worsening leg edema abdominal bloating weight gain  Shortness of breath -  Plan: EKG 12-Lead Likely multifactorial including underlying COPD, pulmonary hypertension/moderate MR, as well as obesity and deconditioning.  Recommended walking program otherwise symptoms are stable  Moderate mitral regurgitation - Plan: EKG 12-Lead Given her relatively stable appearance, does not seem to require aggressive surgical intervention at this time. We did touch on mitral valve clip and other technologies currently in clinical trials On last clinic visit and again today She is not inclined to perform any aggressive management as she feels well with no complaints  Disposition:   F/U  12 months   Total encounter time more than 25 minutes  Greater than 50% was spent in counseling and coordination of care with the patient    Orders Placed This Encounter  Procedures  . EKG 12-Lead     Signed, Esmond Plants, M.D., Ph.D. 06/28/2017  [place], [place]

## 2017-06-28 ENCOUNTER — Encounter: Payer: Self-pay | Admitting: Cardiovascular Disease

## 2017-06-28 ENCOUNTER — Ambulatory Visit (INDEPENDENT_AMBULATORY_CARE_PROVIDER_SITE_OTHER): Payer: Medicare Other | Admitting: Cardiovascular Disease

## 2017-06-28 VITALS — BP 120/70 | HR 82 | Ht 64.0 in | Wt 145.2 lb

## 2017-06-28 DIAGNOSIS — J449 Chronic obstructive pulmonary disease, unspecified: Secondary | ICD-10-CM | POA: Diagnosis not present

## 2017-06-28 DIAGNOSIS — I272 Pulmonary hypertension, unspecified: Secondary | ICD-10-CM | POA: Diagnosis not present

## 2017-06-28 DIAGNOSIS — I340 Nonrheumatic mitral (valve) insufficiency: Secondary | ICD-10-CM | POA: Diagnosis not present

## 2017-06-28 DIAGNOSIS — R0902 Hypoxemia: Secondary | ICD-10-CM | POA: Diagnosis not present

## 2017-06-28 DIAGNOSIS — N183 Chronic kidney disease, stage 3 unspecified: Secondary | ICD-10-CM

## 2017-06-28 DIAGNOSIS — R0602 Shortness of breath: Secondary | ICD-10-CM | POA: Diagnosis not present

## 2017-06-28 NOTE — Patient Instructions (Signed)

## 2017-07-07 ENCOUNTER — Other Ambulatory Visit (INDEPENDENT_AMBULATORY_CARE_PROVIDER_SITE_OTHER): Payer: Medicare Other

## 2017-07-07 ENCOUNTER — Other Ambulatory Visit: Payer: Self-pay | Admitting: Internal Medicine

## 2017-07-07 DIAGNOSIS — N183 Chronic kidney disease, stage 3 unspecified: Secondary | ICD-10-CM

## 2017-07-07 DIAGNOSIS — D72819 Decreased white blood cell count, unspecified: Secondary | ICD-10-CM

## 2017-07-07 DIAGNOSIS — E7800 Pure hypercholesterolemia, unspecified: Secondary | ICD-10-CM

## 2017-07-07 LAB — CBC WITH DIFFERENTIAL/PLATELET
BASOS ABS: 0 10*3/uL (ref 0.0–0.1)
Basophils Relative: 0.6 % (ref 0.0–3.0)
EOS ABS: 0.2 10*3/uL (ref 0.0–0.7)
Eosinophils Relative: 6.1 % — ABNORMAL HIGH (ref 0.0–5.0)
HEMATOCRIT: 33.2 % — AB (ref 36.0–46.0)
HEMOGLOBIN: 10.9 g/dL — AB (ref 12.0–15.0)
LYMPHS PCT: 32 % (ref 12.0–46.0)
Lymphs Abs: 1.2 10*3/uL (ref 0.7–4.0)
MCHC: 33 g/dL (ref 30.0–36.0)
MCV: 84.8 fl (ref 78.0–100.0)
Monocytes Absolute: 0.3 10*3/uL (ref 0.1–1.0)
Monocytes Relative: 7.8 % (ref 3.0–12.0)
Neutro Abs: 2 10*3/uL (ref 1.4–7.7)
Neutrophils Relative %: 53.5 % (ref 43.0–77.0)
Platelets: 176 10*3/uL (ref 150.0–400.0)
RBC: 3.91 Mil/uL (ref 3.87–5.11)
RDW: 13.6 % (ref 11.5–15.5)
WBC: 3.7 10*3/uL — AB (ref 4.0–10.5)

## 2017-07-07 LAB — BASIC METABOLIC PANEL
BUN: 13 mg/dL (ref 6–23)
CALCIUM: 10.2 mg/dL (ref 8.4–10.5)
CHLORIDE: 104 meq/L (ref 96–112)
CO2: 31 mEq/L (ref 19–32)
Creatinine, Ser: 1.38 mg/dL — ABNORMAL HIGH (ref 0.40–1.20)
GFR: 38.79 mL/min — AB (ref >60.00)
Glucose, Bld: 111 mg/dL — ABNORMAL HIGH (ref 70–99)
Potassium: 4.6 mEq/L (ref 3.5–5.1)
Sodium: 143 mEq/L (ref 135–145)

## 2017-07-07 NOTE — Progress Notes (Signed)
Order placed for add on liver panel.

## 2017-07-08 LAB — HEPATIC FUNCTION PANEL
ALBUMIN: 3.9 g/dL (ref 3.5–5.2)
ALT: 14 U/L (ref 0–35)
AST: 21 U/L (ref 0–37)
Alkaline Phosphatase: 57 U/L (ref 39–117)
Bilirubin, Direct: 0.1 mg/dL (ref 0.0–0.3)
TOTAL PROTEIN: 6.4 g/dL (ref 6.0–8.3)
Total Bilirubin: 0.5 mg/dL (ref 0.2–1.2)

## 2017-07-10 ENCOUNTER — Other Ambulatory Visit: Payer: Self-pay | Admitting: Internal Medicine

## 2017-07-10 DIAGNOSIS — D649 Anemia, unspecified: Secondary | ICD-10-CM

## 2017-07-10 NOTE — Progress Notes (Signed)
Orders placed for cbc and ferritin.

## 2017-08-08 ENCOUNTER — Other Ambulatory Visit (INDEPENDENT_AMBULATORY_CARE_PROVIDER_SITE_OTHER): Payer: Medicare Other

## 2017-08-08 DIAGNOSIS — D649 Anemia, unspecified: Secondary | ICD-10-CM

## 2017-08-08 LAB — CBC WITH DIFFERENTIAL/PLATELET
BASOS ABS: 0 10*3/uL (ref 0.0–0.1)
Basophils Relative: 0.6 % (ref 0.0–3.0)
EOS PCT: 4.9 % (ref 0.0–5.0)
Eosinophils Absolute: 0.2 10*3/uL (ref 0.0–0.7)
HEMATOCRIT: 33 % — AB (ref 36.0–46.0)
Hemoglobin: 11 g/dL — ABNORMAL LOW (ref 12.0–15.0)
LYMPHS PCT: 31.7 % (ref 12.0–46.0)
Lymphs Abs: 1.2 10*3/uL (ref 0.7–4.0)
MCHC: 33.2 g/dL (ref 30.0–36.0)
MCV: 85 fl (ref 78.0–100.0)
MONOS PCT: 8.7 % (ref 3.0–12.0)
Monocytes Absolute: 0.3 10*3/uL (ref 0.1–1.0)
Neutro Abs: 2.1 10*3/uL (ref 1.4–7.7)
Neutrophils Relative %: 54.1 % (ref 43.0–77.0)
Platelets: 166 10*3/uL (ref 150.0–400.0)
RBC: 3.88 Mil/uL (ref 3.87–5.11)
RDW: 13.6 % (ref 11.5–15.5)
WBC: 3.9 10*3/uL — ABNORMAL LOW (ref 4.0–10.5)

## 2017-08-08 LAB — FERRITIN: FERRITIN: 155.8 ng/mL (ref 10.0–291.0)

## 2017-08-09 ENCOUNTER — Other Ambulatory Visit: Payer: Self-pay | Admitting: Internal Medicine

## 2017-08-09 ENCOUNTER — Telehealth: Payer: Self-pay

## 2017-08-09 ENCOUNTER — Encounter: Payer: Self-pay | Admitting: Internal Medicine

## 2017-08-09 DIAGNOSIS — D649 Anemia, unspecified: Secondary | ICD-10-CM

## 2017-08-09 NOTE — Progress Notes (Signed)
Opened in error

## 2017-08-09 NOTE — Telephone Encounter (Signed)
Copied from [place] [id number]. Topic: Quick Communication - Office Called Patient >> Aug 09, 2017  9:54 AM Anita Ferrell, [place] wrote: Reason for CRM: Patient had a missed call from the office and would like a call back. She can be reached at [phone]

## 2017-08-09 NOTE — Telephone Encounter (Signed)
This encounter was created in error - please disregard.

## 2017-08-10 ENCOUNTER — Other Ambulatory Visit: Payer: Self-pay | Admitting: Internal Medicine

## 2017-08-10 DIAGNOSIS — D649 Anemia, unspecified: Secondary | ICD-10-CM

## 2017-08-10 NOTE — Progress Notes (Signed)
Order placed for hematology referral.  

## 2017-08-10 NOTE — Telephone Encounter (Signed)
See result note.  

## 2017-08-14 NOTE — Progress Notes (Signed)
[place]  [phone] [phone]  ID: [name] OB: 1932/11/22  MR#: [id number]  QQV#:[id number]  Patient Care Team: [name], MD as PCP - General (Internal Medicine)  CHIEF COMPLAINT: Anemia, unspecified.  INTERVAL HISTORY: Patient is a 84 year old female who was noted to have a mildly decreased hemoglobin on routine blood work.  Patient currently feels well and is asymptomatic.  She has no neurologic complaints.  She denies any recent fevers or illnesses.  She has a good appetite and denies weight loss.  She has no chest pain or shortness of breath.  She denies any nausea, vomiting, constipation, or diarrhea.  She has no melena or hematochezia.  She has no urinary complaints.  Patient feels at her baseline offers no specific complaints today.  REVIEW OF SYSTEMS:   Review of Systems  Constitutional: Negative.  Negative for fever, malaise/fatigue and weight loss.  Respiratory: Negative.  Negative for cough, hemoptysis and shortness of breath.   Cardiovascular: Negative.  Negative for chest pain and leg swelling.  Gastrointestinal: Negative.  Negative for abdominal pain, blood in stool and melena.  Genitourinary: Negative.  Negative for hematuria.  Musculoskeletal: Negative.   Skin: Negative.  Negative for rash.  Neurological: Negative.  Negative for sensory change, focal weakness and weakness.  Psychiatric/Behavioral: Negative.  The patient is not nervous/anxious.     As per HPI. Otherwise, a complete review of systems is negative.  PAST MEDICAL HISTORY: Past Medical History:  Diagnosis Date  . Allergy   . Asthma   . COPD (chronic obstructive pulmonary disease) ([place])   . GERD (gastroesophageal reflux disease)   . Hypercholesteremia   . Hypertension   . Mitral valve insufficiency   . Pulmonary hypertension (HCC)    Right Ventricular systolic pressure at 60 mm Hg by echo on july of 2011; Right heart cardic catherization  revealed pulmonary artery pressusre of 27/10 with a mean of; Ventricular pressure 29/10 with pulmonary capillary wedge at 9  . TB (pulmonary tuberculosis)    treated with INH therapy     PAST SURGICAL HISTORY: Past Surgical History:  Procedure Laterality Date  . CARDIAC CATHETERIZATION  10/2016   [facility]  . CATARACT EXTRACTION Bilateral 2008  . CHOLECYSTECTOMY    . TONSILLECTOMY  1964    FAMILY HISTORY: Family History  Problem Relation Age of Onset  . Heart disease Mother   . Heart disease Father   . Hypertension Sister   . Heart disease Brother   . Breast cancer Maternal Aunt     ADVANCED DIRECTIVES (Y/N):  N  HEALTH MAINTENANCE: Social History   Tobacco Use  . Smoking status: Former Smoker    Packs/day: 1.00    Years: 40.00    Pack years: 40.00    Types: Cigarettes    Last attempt to quit: 08/17/1990    Years since quitting: 27.0  . Smokeless tobacco: Never Used  . Tobacco comment: Quit in 1992  Substance Use Topics  . Alcohol use: No    Alcohol/week: 0.0 oz  . Drug use: No     Colonoscopy:  PAP:  Bone density:  Lipid panel:  Allergies  Allergen Reactions  . Penicillin G Swelling  . Penicillins Swelling and Other (See Comments)    Has patient had a PCN reaction causing immediate rash, facial/tongue/throat swelling, SOB or lightheadedness with hypotension: Yes Has patient had a PCN reaction causing severe rash involving mucus membranes or skin necrosis: No Has patient had a PCN reaction that  required hospitalization No Has patient had a PCN reaction occurring within the last 10 years: No If all of the above answers are "NO", then may proceed with Cephalosporin use.    Current Outpatient Medications  Medication Sig Dispense Refill  . ANORO ELLIPTA 62.5-25 MCG/INH AEPB TAKE 1 PUFF BY MOUTH INTO THE LUNGS DAILY 60 each 4  . aspirin EC 81 MG tablet Take 81 mg by mouth daily.     Marland Kitchen azelastine (ASTELIN) 0.1 % nasal spray USE 1 SPRAY EACH NOSTRIL TWO TIMES  DAILY 30 mL 3  . Calcium Carbonate-Vitamin D (CALCIUM 600+D) 600-400 MG-UNIT tablet Take 1 tablet by mouth at bedtime.    . ferrous sulfate 325 (65 FE) MG tablet Take 325 mg by mouth 2 (two) times daily with a meal.     . fexofenadine (ALLEGRA) 180 MG tablet Take 180 mg by mouth daily.     . furosemide (LASIX) 40 MG tablet Take 1 tablet (40 mg total) by mouth every other day. 30 tablet 11  . lansoprazole (PREVACID) 30 MG capsule Take 30 mg by mouth daily.     . metoprolol tartrate (LOPRESSOR) 50 MG tablet Take 0.5 tablets (25 mg total) by mouth 2 (two) times daily. 60 tablet 11  . Multiple Vitamins-Minerals (CENTRUM SILVER PO) Take by mouth.    . Probiotic Product (ALIGN PO) Take by mouth.    . rosuvastatin (CRESTOR) 10 MG tablet Take 1 tablet (10 mg total) by mouth daily. 90 tablet 1  . sacubitril-valsartan (ENTRESTO) 24-26 MG Take 1 tablet by mouth 2 (two) times daily. 60 tablet 11  . spironolactone (ALDACTONE) 25 MG tablet Take 0.5 tablets (12.5 mg total) by mouth daily. 30 tablet 11  . VENTOLIN HFA 108 (90 Base) MCG/ACT inhaler INHALE TWO PUFFS EVERY FOUR HOURS AS NEEDED. FOR WHEEZE/SHORTNESS OF BREATH 18 g 2  . acetaminophen (TYLENOL) 500 MG tablet Take 500 mg by mouth every 6 (six) hours as needed.    . docusate sodium (COLACE) 100 MG capsule Take by mouth.    . meclizine (ANTIVERT) 25 MG tablet Take 25 mg by mouth 3 (three) times daily as needed for dizziness. Reported on 10/15/2015     No current facility-administered medications for this visit.     OBJECTIVE: Vitals:   08/16/17 1136  BP: 133/81  Pulse: 93  Resp: 18  Temp: (!) 96.3 F (35.7 C)     Body mass index is 24.16 kg/m.    ECOG FS:0 - Asymptomatic  General: Well-developed, well-nourished, no acute distress. Eyes: Pink conjunctiva, anicteric sclera. HEENT: Normocephalic, moist mucous membranes, clear oropharnyx. Lungs: Clear to auscultation bilaterally. Heart: Regular rate and rhythm. No rubs, murmurs, or  gallops. Abdomen: Soft, nontender, nondistended. No organomegaly noted, normoactive bowel sounds. Musculoskeletal: No edema, cyanosis, or clubbing. Neuro: Alert, answering all questions appropriately. Cranial nerves grossly intact. Skin: No rashes or petechiae noted. Psych: Normal affect. Lymphatics: No cervical, calvicular, axillary or inguinal LAD.   LAB RESULTS:  Lab Results  Component Value Date   NA 143 07/07/2017   K 4.6 07/07/2017   CL 104 07/07/2017   CO2 31 07/07/2017   GLUCOSE 111 (H) 07/07/2017   BUN 13 07/07/2017   CREATININE 1.38 (H) 07/07/2017   CALCIUM 10.2 07/07/2017   PROT 6.4 07/07/2017   ALBUMIN 3.9 07/07/2017   AST 21 07/07/2017   ALT 14 07/07/2017   ALKPHOS 57 07/07/2017   BILITOT 0.5 07/07/2017   GFRNONAA 50 (L) 09/08/2015   GFRAA 58 (L)  09/08/2015    Lab Results  Component Value Date   WBC 3.0 (L) 08/16/2017   NEUTROABS 2.1 08/08/2017   HGB 10.8 (L) 08/16/2017   HCT 32.8 (L) 08/16/2017   MCV 85.1 08/16/2017   PLT 163 08/16/2017   Lab Results  Component Value Date   IRON 51 08/16/2017   TIBC 250 08/16/2017   IRONPCTSAT 20 08/16/2017   Lab Results  Component Value Date   FERRITIN 149 08/16/2017     STUDIES: No results found.  ASSESSMENT: Anemia, unspecified.  PLAN:    1. Anemia, unspecified: Patient's hemoglobin is mildly decreased at 10.8, but essentially stable.  Her iron stores, B12, folate, SPEP, erythropoietin, and hemolysis labs are all either negative or within normal limits.  She last had a colonoscopy and EGD in January 2016.  She has a mild renal insufficiency that may be contributing.  Also, given her mild leukopenia she may have a component of MDS which would require bone marrow biopsy to diagnose.  This is not necessary at this point.  No intervention is needed.  Return to clinic in 3 months with repeat laboratory work and further evaluation. 2.  Leukopenia: Mild, monitor. 3.  Renal insufficiency: Patient's baseline  creatinine appears to be about 1.4.  Monitor.    Approximately 45 minutes was spent in discussion of which greater than 50% consultation.  Patient expressed understanding and was in agreement with this plan. She also understands that She can call clinic at any time with any questions, concerns, or complaints.    Lloyd Huger, MD   08/17/2017 10:39 PM

## 2017-08-16 ENCOUNTER — Other Ambulatory Visit: Payer: Self-pay

## 2017-08-16 ENCOUNTER — Inpatient Hospital Stay: Payer: Medicare Other | Attending: Oncology | Admitting: Oncology

## 2017-08-16 ENCOUNTER — Encounter: Payer: Self-pay | Admitting: Oncology

## 2017-08-16 ENCOUNTER — Inpatient Hospital Stay: Payer: Medicare Other

## 2017-08-16 VITALS — BP 133/81 | HR 93 | Temp 96.3°F | Resp 18 | Ht 64.96 in | Wt 145.0 lb

## 2017-08-16 DIAGNOSIS — E7800 Pure hypercholesterolemia, unspecified: Secondary | ICD-10-CM | POA: Diagnosis not present

## 2017-08-16 DIAGNOSIS — Z87891 Personal history of nicotine dependence: Secondary | ICD-10-CM | POA: Insufficient documentation

## 2017-08-16 DIAGNOSIS — I272 Pulmonary hypertension, unspecified: Secondary | ICD-10-CM | POA: Diagnosis not present

## 2017-08-16 DIAGNOSIS — Z7982 Long term (current) use of aspirin: Secondary | ICD-10-CM | POA: Diagnosis not present

## 2017-08-16 DIAGNOSIS — D72819 Decreased white blood cell count, unspecified: Secondary | ICD-10-CM | POA: Insufficient documentation

## 2017-08-16 DIAGNOSIS — K219 Gastro-esophageal reflux disease without esophagitis: Secondary | ICD-10-CM | POA: Insufficient documentation

## 2017-08-16 DIAGNOSIS — N289 Disorder of kidney and ureter, unspecified: Secondary | ICD-10-CM | POA: Diagnosis not present

## 2017-08-16 DIAGNOSIS — D649 Anemia, unspecified: Secondary | ICD-10-CM | POA: Diagnosis not present

## 2017-08-16 DIAGNOSIS — J449 Chronic obstructive pulmonary disease, unspecified: Secondary | ICD-10-CM | POA: Insufficient documentation

## 2017-08-16 DIAGNOSIS — Z79899 Other long term (current) drug therapy: Secondary | ICD-10-CM | POA: Insufficient documentation

## 2017-08-16 DIAGNOSIS — I10 Essential (primary) hypertension: Secondary | ICD-10-CM | POA: Insufficient documentation

## 2017-08-16 LAB — IRON AND TIBC
Iron: 51 ug/dL (ref 28–170)
Saturation Ratios: 20 % (ref 10.4–31.8)
TIBC: 250 ug/dL (ref 250–450)
UIBC: 200 ug/dL

## 2017-08-16 LAB — CBC
HCT: 32.8 % — ABNORMAL LOW (ref 35.0–47.0)
Hemoglobin: 10.8 g/dL — ABNORMAL LOW (ref 12.0–16.0)
MCH: 28.1 pg (ref 26.0–34.0)
MCHC: 33 g/dL (ref 32.0–36.0)
MCV: 85.1 fL (ref 80.0–100.0)
PLATELETS: 163 10*3/uL (ref 150–440)
RBC: 3.85 MIL/uL (ref 3.80–5.20)
RDW: 13.3 % (ref 11.5–14.5)
WBC: 3 10*3/uL — ABNORMAL LOW (ref 3.6–11.0)

## 2017-08-16 LAB — VITAMIN B12: Vitamin B-12: 587 pg/mL (ref 180–914)

## 2017-08-16 LAB — LACTATE DEHYDROGENASE: LDH: 125 U/L (ref 98–192)

## 2017-08-16 LAB — FOLATE: FOLATE: 65 ng/mL (ref >5.9)

## 2017-08-16 LAB — FERRITIN: FERRITIN: 149 ng/mL (ref 11–307)

## 2017-08-16 NOTE — Progress Notes (Signed)
Here for new pt evaluation.  

## 2017-08-17 LAB — PROTEIN ELECTROPHORESIS, SERUM
A/G Ratio: 1.5 (ref 0.7–1.7)
Albumin ELP: 3.5 g/dL (ref 2.9–4.4)
Alpha-1-Globulin: 0.3 g/dL (ref 0.0–0.4)
Alpha-2-Globulin: 0.8 g/dL (ref 0.4–1.0)
Beta Globulin: 0.8 g/dL (ref 0.7–1.3)
GLOBULIN, TOTAL: 2.4 g/dL (ref 2.2–3.9)
Gamma Globulin: 0.5 g/dL (ref 0.4–1.8)
Total Protein ELP: 5.9 g/dL — ABNORMAL LOW (ref 6.0–8.5)

## 2017-08-17 LAB — HAPTOGLOBIN: HAPTOGLOBIN: 242 mg/dL — AB (ref 34–200)

## 2017-08-17 LAB — ERYTHROPOIETIN: ERYTHROPOIETIN: 11.1 m[IU]/mL (ref 2.6–18.5)

## 2017-09-07 ENCOUNTER — Ambulatory Visit
Admission: RE | Admit: 2017-09-07 | Discharge: 2017-09-07 | Disposition: A | Discharge status: Home / Self Care | Payer: Medicare Other | Source: Ambulatory Visit | Attending: Internal Medicine | Admitting: Internal Medicine

## 2017-09-07 ENCOUNTER — Other Ambulatory Visit: Payer: Self-pay | Admitting: Pulmonary Disease

## 2017-09-07 DIAGNOSIS — Z1231 Encounter for screening mammogram for malignant neoplasm of breast: Secondary | ICD-10-CM | POA: Diagnosis not present

## 2017-09-07 DIAGNOSIS — Z1239 Encounter for other screening for malignant neoplasm of breast: Secondary | ICD-10-CM

## 2017-09-07 MED ORDER — AZELASTINE HCL 0.1 % NA SOLN: NASAL | 3 refills | Status: DC

## 2017-09-07 NOTE — Telephone Encounter (Signed)
Paper rx request submitted

## 2017-10-13 ENCOUNTER — Ambulatory Visit (INDEPENDENT_AMBULATORY_CARE_PROVIDER_SITE_OTHER): Payer: Medicare Other | Admitting: Internal Medicine

## 2017-10-13 ENCOUNTER — Encounter: Payer: Self-pay | Admitting: Internal Medicine

## 2017-10-13 DIAGNOSIS — K649 Unspecified hemorrhoids: Secondary | ICD-10-CM

## 2017-10-13 DIAGNOSIS — K2100 Gastro-esophageal reflux disease with esophagitis, without bleeding: Secondary | ICD-10-CM

## 2017-10-13 DIAGNOSIS — E7800 Pure hypercholesterolemia, unspecified: Secondary | ICD-10-CM

## 2017-10-13 DIAGNOSIS — I10 Essential (primary) hypertension: Secondary | ICD-10-CM

## 2017-10-13 DIAGNOSIS — J449 Chronic obstructive pulmonary disease, unspecified: Secondary | ICD-10-CM | POA: Diagnosis not present

## 2017-10-13 DIAGNOSIS — R0902 Hypoxemia: Secondary | ICD-10-CM | POA: Diagnosis not present

## 2017-10-13 DIAGNOSIS — N183 Chronic kidney disease, stage 3 unspecified: Secondary | ICD-10-CM

## 2017-10-13 DIAGNOSIS — R739 Hyperglycemia, unspecified: Secondary | ICD-10-CM

## 2017-10-13 DIAGNOSIS — I272 Pulmonary hypertension, unspecified: Secondary | ICD-10-CM

## 2017-10-13 DIAGNOSIS — J4520 Mild intermittent asthma, uncomplicated: Secondary | ICD-10-CM | POA: Diagnosis not present

## 2017-10-13 DIAGNOSIS — D649 Anemia, unspecified: Secondary | ICD-10-CM

## 2017-10-13 LAB — HEPATIC FUNCTION PANEL
ALBUMIN: 4.1 g/dL (ref 3.5–5.2)
ALK PHOS: 59 U/L (ref 39–117)
ALT: 16 U/L (ref 0–35)
AST: 23 U/L (ref 0–37)
Bilirubin, Direct: 0.1 mg/dL (ref 0.0–0.3)
Total Bilirubin: 0.5 mg/dL (ref 0.2–1.2)
Total Protein: 6.5 g/dL (ref 6.0–8.3)

## 2017-10-13 LAB — LIPID PANEL
CHOLESTEROL: 119 mg/dL (ref 0–200)
HDL: 46.6 mg/dL (ref >39.00)
LDL Cholesterol: 52 mg/dL (ref 0–99)
NONHDL: 71.96
Total CHOL/HDL Ratio: 3
Triglycerides: 99 mg/dL (ref 0.0–149.0)
VLDL: 19.8 mg/dL (ref 0.0–40.0)

## 2017-10-13 LAB — BASIC METABOLIC PANEL
BUN: 14 mg/dL (ref 6–23)
CO2: 32 meq/L (ref 19–32)
CREATININE: 1.35 mg/dL — AB (ref 0.40–1.20)
Calcium: 9.9 mg/dL (ref 8.4–10.5)
Chloride: 102 mEq/L (ref 96–112)
GFR: 39.76 mL/min — ABNORMAL LOW (ref >60.00)
Glucose, Bld: 116 mg/dL — ABNORMAL HIGH (ref 70–99)
Potassium: 4.3 mEq/L (ref 3.5–5.1)
Sodium: 142 mEq/L (ref 135–145)

## 2017-10-13 LAB — HEMOGLOBIN A1C: HEMOGLOBIN A1C: 6 % (ref 4.6–6.5)

## 2017-10-13 NOTE — Progress Notes (Signed)
Patient ID: Anita Ferrell, female   DOB: [birth year], 84 y.o.   MRN: [id number]   Subjective:    Patient ID: Anita Ferrell, female    DOB: 10-Jul-1932, 84 y.o.   MRN: [id number]  HPI  Patient here for a scheduled follow up.  Saw Dr Grayland Ormond 08/16/17 for f/u anemia.  Felt things were stable.  Recommended f/u in 3 months.  Also had f/u in 06/2017 for pulmonary hypertension.  Felt stable on current regimen.  Recommended f/u in one year.   Overall she feels she is doing relatively well.  Does noticing getting a little winded with stairs, but reports this is stable.  Overall she feels her breathing is stable.  On anora now.  Feels helps more.  No chest pain.  No increased cough or congestion.  No acid reflux. No abdominal pain.  Still having issues with her hemorrhoids.  Previously saw Dr Tamala Julian.  He has retired.  Would like to stay in that practice.     Past Medical History:  Diagnosis Date  . Allergy   . Asthma   . COPD (chronic obstructive pulmonary disease) ([place])   . GERD (gastroesophageal reflux disease)   . Hypercholesteremia   . Hypertension   . Mitral valve insufficiency   . Pulmonary hypertension (HCC)    Right Ventricular systolic pressure at 60 mm Hg by echo on july of 2011; Right heart cardic catherization revealed pulmonary artery pressusre of 27/10 with a mean of; Ventricular pressure 29/10 with pulmonary capillary wedge at 9  . TB (pulmonary tuberculosis)    treated with INH therapy    Past Surgical History:  Procedure Laterality Date  . CARDIAC CATHETERIZATION  10/2016   [facility]  . CATARACT EXTRACTION Bilateral 2008  . CHOLECYSTECTOMY    . TONSILLECTOMY  1964   Family History  Problem Relation Age of Onset  . Heart disease Mother   . Heart disease Father   . Hypertension Sister   . Heart disease Brother   . Breast cancer Maternal Aunt    Social History   Socioeconomic History  . Marital status: Widowed    Spouse name: Not on file  . Number of children: 3  .  Years of education: Not on file  . Highest education level: Not on file  Occupational History  . Not on file  Social Needs  . Financial resource strain: Not on file  . Food insecurity:    Worry: Not on file    Inability: Not on file  . Transportation needs:    Medical: Not on file    Non-medical: Not on file  Tobacco Use  . Smoking status: Former Smoker    Packs/day: 1.00    Years: 40.00    Pack years: 40.00    Types: Cigarettes    Last attempt to quit: 08/17/1990    Years since quitting: 27.1  . Smokeless tobacco: Never Used  . Tobacco comment: Quit in 1992  Substance and Sexual Activity  . Alcohol use: No    Alcohol/week: 0.0 oz  . Drug use: No  . Sexual activity: Never  Lifestyle  . Physical activity:    Days per week: Not on file    Minutes per session: Not on file  . Stress: Not on file  Relationships  . Social connections:    Talks on phone: Not on file    Gets together: Not on file    Attends religious service: Not on file    Active member  of club or organization: Not on file    Attends meetings of clubs or organizations: Not on file    Relationship status: Not on file  Other Topics Concern  . Not on file  Social History Narrative  . Not on file    Outpatient Encounter Medications as of 10/13/2017  Medication Sig  . acetaminophen (TYLENOL) 500 MG tablet Take 500 mg by mouth every 6 (six) hours as needed.  Jearl Klinefelter ELLIPTA 62.5-25 MCG/INH AEPB TAKE 1 PUFF BY MOUTH INTO THE LUNGS DAILY  . aspirin EC 81 MG tablet Take 81 mg by mouth daily.   Marland Kitchen azelastine (ASTELIN) 0.1 % nasal spray USE 1 SPRAY EACH NOSTRIL TWO TIMES DAILY  . Calcium Carbonate-Vitamin D (CALCIUM 600+D) 600-400 MG-UNIT tablet Take 1 tablet by mouth at bedtime.  . docusate sodium (COLACE) 100 MG capsule Take by mouth.  . ferrous sulfate 325 (65 FE) MG tablet Take 325 mg by mouth 2 (two) times daily with a meal.   . fexofenadine (ALLEGRA) 180 MG tablet Take 180 mg by mouth daily.   . furosemide  (LASIX) 40 MG tablet Take 1 tablet (40 mg total) by mouth every other day.  . lansoprazole (PREVACID) 30 MG capsule Take 30 mg by mouth daily.   . meclizine (ANTIVERT) 25 MG tablet Take 25 mg by mouth 3 (three) times daily as needed for dizziness. Reported on 10/15/2015  . metoprolol tartrate (LOPRESSOR) 50 MG tablet Take 0.5 tablets (25 mg total) by mouth 2 (two) times daily.  . Multiple Vitamins-Minerals (CENTRUM SILVER PO) Take by mouth.  . Probiotic Product (ALIGN PO) Take by mouth.  . rosuvastatin (CRESTOR) 10 MG tablet Take 1 tablet (10 mg total) by mouth daily.  . sacubitril-valsartan (ENTRESTO) 24-26 MG Take 1 tablet by mouth 2 (two) times daily.  Marland Kitchen spironolactone (ALDACTONE) 25 MG tablet Take 0.5 tablets (12.5 mg total) by mouth daily.  . VENTOLIN HFA 108 (90 Base) MCG/ACT inhaler INHALE TWO PUFFS EVERY FOUR HOURS AS NEEDED. FOR WHEEZE/SHORTNESS OF BREATH   No facility-administered encounter medications on file as of 10/13/2017.     Review of Systems  Constitutional: Negative for appetite change and unexpected weight change.  HENT: Negative for congestion and sinus pressure.   Respiratory: Negative for cough and chest tightness.        Some windedness with stairs.  Stable.    Cardiovascular: Negative for chest pain, palpitations and leg swelling.  Gastrointestinal: Negative for abdominal pain, diarrhea, nausea and vomiting.       Some persistent trouble with bleeding hemorrhoids.   Genitourinary: Negative for difficulty urinating and dysuria.  Musculoskeletal: Negative for joint swelling and myalgias.  Skin: Negative for color change and rash.  Neurological: Negative for dizziness, light-headedness and headaches.  Psychiatric/Behavioral: Negative for agitation and dysphoric mood.       Objective:     Blood pressure rechecked by me:  132/76  Physical Exam  Constitutional: She appears well-developed and well-nourished. No distress.  HENT:  Nose: Nose normal.  Mouth/Throat:  Oropharynx is clear and moist.  Neck: Neck supple. No thyromegaly present.  Cardiovascular: Normal rate and regular rhythm.  Pulmonary/Chest: Breath sounds normal. No respiratory distress. She has no wheezes.  Abdominal: Soft. Bowel sounds are normal. There is no tenderness.  Musculoskeletal: She exhibits no edema or tenderness.  Lymphadenopathy:    She has no cervical adenopathy.  Skin: No rash noted. No erythema.  Psychiatric: She has a normal mood and affect. Her behavior is normal.  BP 126/70 (BP Location: Left Arm, Patient Position: Sitting, Cuff Size: Normal)   Pulse 75   Temp (!) 97.3 F (36.3 C) (Oral)   Resp 18   Wt 144 lb 3.2 oz (65.4 kg)   SpO2 97%   BMI 24.02 kg/m  Wt Readings from Last 3 Encounters:  10/13/17 144 lb 3.2 oz (65.4 kg)  08/16/17 145 lb (65.8 kg)  06/28/17 145 lb 4 oz (65.9 kg)     Lab Results  Component Value Date   WBC 3.0 (L) 08/16/2017   HGB 10.8 (L) 08/16/2017   HCT 32.8 (L) 08/16/2017   PLT 163 08/16/2017   GLUCOSE 116 (H) 10/13/2017   CHOL 119 10/13/2017   TRIG 99.0 10/13/2017   HDL 46.60 10/13/2017   LDLDIRECT 127.1 08/25/2012   LDLCALC 52 10/13/2017   ALT 16 10/13/2017   AST 23 10/13/2017   NA 142 10/13/2017   K 4.3 10/13/2017   CL 102 10/13/2017   CREATININE 1.35 (H) 10/13/2017   BUN 14 10/13/2017   CO2 32 10/13/2017   TSH 1.19 05/31/2017   HGBA1C 6.0 10/13/2017    Mm 3d Screen Breast Bilateral  Result Date: 09/07/2017 CLINICAL DATA:  Screening. EXAM: DIGITAL SCREENING BILATERAL MAMMOGRAM WITH TOMO AND CAD COMPARISON:  Previous exam(s). ACR Breast Density Category a: The breast tissue is almost entirely fatty. FINDINGS: There are no findings suspicious for malignancy. Images were processed with CAD. IMPRESSION: No mammographic evidence of malignancy. A result letter of this screening mammogram will be mailed directly to the patient. RECOMMENDATION: Screening mammogram in one year. (Code:SM-B-01Y) BI-RADS CATEGORY  1:  Negative. Electronically Signed   By: Ammie Ferrier M.D.   On: 09/07/2017 13:10       Assessment & Plan:   Problem List Items Addressed This Visit    Anemia    Has seen hematology.  Had extensive w/up.  Follow cbc.       Asthma, chronic    Followed by pulmonary.        CKD (chronic kidney disease) stage 3, GFR 30-59 ml/min (HCC)    Avoid antiinflammatories.  Follow metabolic panel.        Relevant Orders   Basic metabolic panel (Completed)   COPD with hypoxia ([place])    Followed by Dr Alva Garnet.  Overall she feels her breathing is stable.        Essential hypertension    Blood pressure under good control.  Continue same medication regimen.  Follow pressures.  Follow metabolic panel.        GERD (gastroesophageal reflux disease)    Controlled on current regimen.        Hemorrhoids    Is s/p rubber band ligation.  Still having issues.  Previously saw Dr Tamala Julian.  Would like to remain in that office.        Relevant Orders   Ambulatory referral to General Surgery   Hypercholesterolemia    Low cholesterol diet and exercise.  On crestor.  Follow lipid panel and liver function tests.        Relevant Orders   Hepatic function panel (Completed)   Lipid panel (Completed)   Hyperglycemia    Low carb diet and exercise.  Follow met b and a1c.        Relevant Orders   Hemoglobin A1c (Completed)   Pulmonary HTN (HCC)    Last PCWP documented - 8.  Followed by cardiology and pulmonary.  Stable.  [name], MD

## 2017-10-16 ENCOUNTER — Encounter: Payer: Self-pay | Admitting: Internal Medicine

## 2017-10-16 NOTE — Assessment & Plan Note (Signed)
Controlled on current regimen.   

## 2017-10-16 NOTE — Assessment & Plan Note (Signed)
Followed by pulmonary 

## 2017-10-16 NOTE — Assessment & Plan Note (Signed)
Avoid antiinflammatories.  Follow metabolic panel.  

## 2017-10-16 NOTE — Assessment & Plan Note (Signed)
Blood pressure under good control.  Continue same medication regimen.  Follow pressures.  Follow metabolic panel.   

## 2017-10-16 NOTE — Assessment & Plan Note (Signed)
Is s/p rubber band ligation.  Still having issues.  Previously saw Dr Tamala Julian.  Would like to remain in that office.

## 2017-10-16 NOTE — Assessment & Plan Note (Signed)
Low cholesterol diet and exercise.  On crestor.  Follow lipid panel and liver function tests.  

## 2017-10-16 NOTE — Assessment & Plan Note (Signed)
Low carb diet and exercise.  Follow met b and a1c.   

## 2017-10-16 NOTE — Assessment & Plan Note (Signed)
Last PCWP documented - 8.  Followed by cardiology and pulmonary.  Stable.

## 2017-10-16 NOTE — Assessment & Plan Note (Signed)
Has seen hematology.  Had extensive w/up.  Follow cbc.

## 2017-10-16 NOTE — Assessment & Plan Note (Signed)
Followed by Dr Alva Garnet.  Overall she feels her breathing is stable.

## 2017-10-18 ENCOUNTER — Encounter (INDEPENDENT_AMBULATORY_CARE_PROVIDER_SITE_OTHER): Payer: Self-pay

## 2017-10-21 ENCOUNTER — Other Ambulatory Visit: Payer: Self-pay | Admitting: Internal Medicine

## 2017-10-21 DIAGNOSIS — N183 Chronic kidney disease, stage 3 unspecified: Secondary | ICD-10-CM

## 2017-10-21 NOTE — Progress Notes (Signed)
Order placed for nephrology referral.   °

## 2017-10-24 DIAGNOSIS — K644 Residual hemorrhoidal skin tags: Secondary | ICD-10-CM | POA: Diagnosis not present

## 2017-10-24 DIAGNOSIS — K640 First degree hemorrhoids: Secondary | ICD-10-CM | POA: Diagnosis not present

## 2017-10-24 DIAGNOSIS — K625 Hemorrhage of anus and rectum: Secondary | ICD-10-CM | POA: Diagnosis not present

## 2017-10-31 DIAGNOSIS — K640 First degree hemorrhoids: Secondary | ICD-10-CM | POA: Diagnosis not present

## 2017-11-15 ENCOUNTER — Encounter: Payer: Self-pay | Admitting: Pulmonary Disease

## 2017-11-15 ENCOUNTER — Ambulatory Visit (INDEPENDENT_AMBULATORY_CARE_PROVIDER_SITE_OTHER): Payer: Medicare Other | Admitting: Pulmonary Disease

## 2017-11-15 VITALS — BP 102/60 | HR 102 | Ht 64.9 in | Wt 145.0 lb

## 2017-11-15 DIAGNOSIS — J449 Chronic obstructive pulmonary disease, unspecified: Secondary | ICD-10-CM | POA: Diagnosis not present

## 2017-11-15 DIAGNOSIS — R05 Cough: Secondary | ICD-10-CM

## 2017-11-15 DIAGNOSIS — R053 Chronic cough: Secondary | ICD-10-CM

## 2017-11-15 MED ORDER — UMECLIDINIUM-VILANTEROL 62.5-25 MCG/INH IN AEPB: INHALATION_SPRAY | RESPIRATORY_TRACT | 6 refills | Status: DC

## 2017-11-15 MED ORDER — ALBUTEROL SULFATE HFA 108 (90 BASE) MCG/ACT IN AERS: INHALATION_SPRAY | RESPIRATORY_TRACT | 2 refills | Status: DC

## 2017-11-15 NOTE — Patient Instructions (Signed)
Continue Anoro inhaler daily  Continue albuterol inhaler as needed for increased shortness of breath, wheezing, cough, chest tightness  Follow-up as needed for any breathing, chest or respiratory symptoms

## 2017-11-21 NOTE — Progress Notes (Signed)
[facility]  [phone] [phone]) [phone]  ID: Anita Ferrell OB: 1932-05-31  MR#: [id number]  ZTI#:[id number]  Patient Care Team: [name], MD as PCP - General (Internal Medicine)  CHIEF COMPLAINT: Anemia, unspecified.  INTERVAL HISTORY: Patient returns to clinic today for repeat laboratory work and further evaluation.  She continues to feel well and remains asymptomatic. She has no neurologic complaints.  She denies any recent fevers or illnesses.  She has a good appetite and denies weight loss.  She has no chest pain or shortness of breath.  She denies any nausea, vomiting, constipation, or diarrhea.  She has no melena or hematochezia.  She has no urinary complaints.  Patient offers no specific complaints today.  REVIEW OF SYSTEMS:   Review of Systems  Constitutional: Negative.  Negative for fever, malaise/fatigue and weight loss.  Respiratory: Negative.  Negative for cough, hemoptysis and shortness of breath.   Cardiovascular: Negative.  Negative for chest pain and leg swelling.  Gastrointestinal: Negative.  Negative for abdominal pain, blood in stool and melena.  Genitourinary: Negative.  Negative for hematuria.  Musculoskeletal: Negative.   Skin: Negative.  Negative for rash.  Neurological: Negative.  Negative for sensory change, focal weakness and weakness.  Psychiatric/Behavioral: Negative.  The patient is not nervous/anxious.     As per HPI. Otherwise, a complete review of systems is negative.  PAST MEDICAL HISTORY: Past Medical History:  Diagnosis Date  . Allergy   . Asthma   . COPD (chronic obstructive pulmonary disease) ([place])   . GERD (gastroesophageal reflux disease)   . Hypercholesteremia   . Hypertension   . Mitral valve insufficiency   . Pulmonary hypertension (HCC)    Right Ventricular systolic pressure at 60 mm Hg by echo on july of 2011; Right heart cardic catherization revealed pulmonary artery pressusre of 27/10  with a mean of; Ventricular pressure 29/10 with pulmonary capillary wedge at 9  . TB (pulmonary tuberculosis)    treated with INH therapy     PAST SURGICAL HISTORY: Past Surgical History:  Procedure Laterality Date  . CARDIAC CATHETERIZATION  10/2016   [facility]  . CATARACT EXTRACTION Bilateral 2008  . CHOLECYSTECTOMY    . TONSILLECTOMY  1964    FAMILY HISTORY: Family History  Problem Relation Age of Onset  . Heart disease Mother   . Heart disease Father   . Hypertension Sister   . Heart disease Brother   . Breast cancer Maternal Aunt     ADVANCED DIRECTIVES (Y/N):  N  HEALTH MAINTENANCE: Social History   Tobacco Use  . Smoking status: Former Smoker    Packs/day: 1.00    Years: 40.00    Pack years: 40.00    Types: Cigarettes    Last attempt to quit: 08/17/1990    Years since quitting: 27.2  . Smokeless tobacco: Never Used  . Tobacco comment: Quit in 1992  Substance Use Topics  . Alcohol use: No    Alcohol/week: 0.0 standard drinks  . Drug use: No     Colonoscopy:  PAP:  Bone density:  Lipid panel:  Allergies  Allergen Reactions  . Penicillin G Swelling  . Penicillins Swelling and Other (See Comments)    Has patient had a PCN reaction causing immediate rash, facial/tongue/throat swelling, SOB or lightheadedness with hypotension: Yes Has patient had a PCN reaction causing severe rash involving mucus membranes or skin necrosis: No Has patient had a PCN reaction that required hospitalization No Has patient had a PCN reaction  occurring within the last 10 years: No If all of the above answers are "NO", then may proceed with Cephalosporin use.    Current Outpatient Medications  Medication Sig Dispense Refill  . acetaminophen (TYLENOL) 500 MG tablet Take 500 mg by mouth every 6 (six) hours as needed.    Marland Kitchen albuterol (VENTOLIN HFA) 108 (90 Base) MCG/ACT inhaler INHALE TWO PUFFS EVERY FOUR HOURS AS NEEDED. FOR WHEEZE/SHORTNESS OF BREATH 18 g 2  . aspirin EC 81 MG  tablet Take 81 mg by mouth daily.     Marland Kitchen azelastine (ASTELIN) 0.1 % nasal spray USE 1 SPRAY EACH NOSTRIL TWO TIMES DAILY 30 mL 3  . Calcium Carbonate-Vitamin D (CALCIUM 600+D) 600-400 MG-UNIT tablet Take 1 tablet by mouth at bedtime.    . docusate sodium (COLACE) 100 MG capsule Take by mouth.    . ferrous sulfate 325 (65 FE) MG tablet Take 325 mg by mouth 2 (two) times daily with a meal.     . fexofenadine (ALLEGRA) 180 MG tablet Take 180 mg by mouth daily.     . furosemide (LASIX) 40 MG tablet Take 1 tablet (40 mg total) by mouth every other day. 30 tablet 11  . lansoprazole (PREVACID) 30 MG capsule Take 30 mg by mouth daily.     . meclizine (ANTIVERT) 25 MG tablet Take 25 mg by mouth 3 (three) times daily as needed for dizziness. Reported on 10/15/2015    . metoprolol tartrate (LOPRESSOR) 50 MG tablet Take 0.5 tablets (25 mg total) by mouth 2 (two) times daily. 60 tablet 11  . Multiple Vitamins-Minerals (CENTRUM SILVER PO) Take by mouth.    . Probiotic Product (ALIGN PO) Take by mouth.    . rosuvastatin (CRESTOR) 10 MG tablet TAKE 1 TABLET BY MOUTH EVERY DAY 90 tablet 1  . sacubitril-valsartan (ENTRESTO) 24-26 MG Take 1 tablet by mouth 2 (two) times daily. 60 tablet 11  . spironolactone (ALDACTONE) 25 MG tablet Take 0.5 tablets (12.5 mg total) by mouth daily. 30 tablet 11  . umeclidinium-vilanterol (ANORO ELLIPTA) 62.5-25 MCG/INH AEPB TAKE 1 PUFF BY MOUTH INTO THE LUNGS DAILY 60 each 6   No current facility-administered medications for this visit.     OBJECTIVE: Vitals:   11/24/17 1023  BP: 101/64  Pulse: 86  Resp: 18  Temp: (!) 97.3 F (36.3 C)  SpO2: 94%     Body mass index is 24.22 kg/m.    ECOG FS:0 - Asymptomatic  General: Well-developed, well-nourished, no acute distress. Eyes: Pink conjunctiva, anicteric sclera. HEENT: Normocephalic, moist mucous membranes. Lungs: Clear to auscultation bilaterally. Heart: Regular rate and rhythm. No rubs, murmurs, or gallops. Abdomen:  Soft, nontender, nondistended. No organomegaly noted, normoactive bowel sounds. Musculoskeletal: No edema, cyanosis, or clubbing. Neuro: Alert, answering all questions appropriately. Cranial nerves grossly intact. Skin: No rashes or petechiae noted. Psych: Normal affect.  LAB RESULTS:  Lab Results  Component Value Date   NA 142 10/13/2017   K 4.3 10/13/2017   CL 102 10/13/2017   CO2 32 10/13/2017   GLUCOSE 116 (H) 10/13/2017   BUN 14 10/13/2017   CREATININE 1.35 (H) 10/13/2017   CALCIUM 9.9 10/13/2017   PROT 6.5 10/13/2017   ALBUMIN 4.1 10/13/2017   AST 23 10/13/2017   ALT 16 10/13/2017   ALKPHOS 59 10/13/2017   BILITOT 0.5 10/13/2017   GFRNONAA 50 (L) 09/08/2015   GFRAA 58 (L) 09/08/2015    Lab Results  Component Value Date   WBC 3.4 (L) 11/24/2017  NEUTROABS 2.2 11/24/2017   HGB 10.9 (L) 11/24/2017   HCT 33.4 (L) 11/24/2017   MCV 85.0 11/24/2017   PLT 161 11/24/2017   Lab Results  Component Value Date   IRON 51 08/16/2017   TIBC 250 08/16/2017   IRONPCTSAT 20 08/16/2017   Lab Results  Component Value Date   FERRITIN 149 08/16/2017     STUDIES: No results found.  ASSESSMENT: Anemia, unspecified.  PLAN:    1. Anemia, unspecified: Patient's hemoglobin remains decreased, but essentially unchanged at 10.9.  All of her other laboratory work including iron stores, B12, folate, SPEP, erythropoietin, and hemolysis labs are all either negative or within normal limits.  She last had a colonoscopy and EGD in January 2016.  She has a mild renal insufficiency that may be contributing.  Also, given her mild leukopenia she may have a component of MDS which would require bone marrow biopsy to diagnose.  This is not necessary at this point.  No intervention is needed.  Return to clinic in 6 months with repeat laboratory work and further evaluation. 2.  Leukopenia: Mild at 3.4.  Monitor.  3.  Renal insufficiency: Patient's baseline creatinine appears to be about 1.4.   Monitor.    I spent a total of 20 minutes face-to-face with the patient of which greater than 50% of the visit was spent in counseling and coordination of care as detailed above.   Patient expressed understanding and was in agreement with this plan. She also understands that She can call clinic at any time with any questions, concerns, or complaints.    Lloyd Huger, MD   11/27/2017 8:27 AM

## 2017-11-22 NOTE — Progress Notes (Signed)
PULMONARY OFFICE FOLLOW UP NOTE  Requesting MD/Service: Self Date of initial consultation: 06/20/15 Reason for consultation: COPD, lung nodule  PROBLEMS:  Remote smoker Moderate COPD/emphysema Lung nodule -decreased in size, no further follow-up H/O positive PPD - S/P 9 months INH.   Data: CT chest 07/29/15:Significant decrease in size of the previously demonstrated right lower lobe nodule, compatible with a benign process. No significant change in multiple additional sub cm nodules and calcified granulomata in both lungs, compatible with a benign process. Mild changes of COPD and chronic bronchitis. Resolved infection in the lingula and left lower lobe PFTs 07/29/15: moderate obstruction, normal lung volumes, mild reduction DLCO.  TEE 10/19/16: LVEF 45%. Moderate mitral regurgitation [facility] 10/25/16: Nonobstructive coronary disease. Moderate pulmonary hypertension (systolic 50 mmHg, mean 30 mmHg)  SUBJ: This is a scheduled reevaluation. Last seen Jan 2019.  No major events since then. Remains on Anoro and albuterol HFA as needed which she is using 1x/d on average. Albuterol helps her cough. She denies CP, fever, purulent sputum, hemoptysis, LE edema and calf tenderness. ONJ: Vitals:   11/15/17 1020 11/15/17 1023  BP:  102/60  Pulse:  (!) 102  SpO2:  95%  Weight: 145 lb (65.8 kg)   Height: 5' 4.9" (1.648 m)    EXAM:  Gen: NAD HEENT: NCAT, sclera white Neck: No JVD Lungs: breath sounds mi;d;y diminished, no wheezes or other adventitious sounds Cardiovascular: RRR, no murmurs Abdomen: Soft, nontender, normal BS Ext: without clubbing, cyanosis, edema Neuro: grossly intact Skin: Limited exam, no lesions noted   DATA:   BMP Latest Ref Rng & Units 10/13/2017 07/07/2017 05/31/2017  Glucose 70 - 99 mg/dL 116(H) 111(H) 111(H)  BUN 6 - 23 mg/dL 14 13 16   Creatinine 0.40 - 1.20 mg/dL 1.35(H) 1.38(H) 1.40(H)  Sodium 135 - 145 mEq/L 142 143 143  Potassium 3.5 - 5.1 mEq/L 4.3 4.6 4.6   Chloride 96 - 112 mEq/L 102 104 103  CO2 19 - 32 mEq/L 32 31 34(H)  Calcium 8.4 - 10.5 mg/dL 9.9 10.2 9.6    CBC Latest Ref Rng & Units 08/16/2017 08/08/2017 07/07/2017  WBC 3.6 - 11.0 K/uL 3.0(L) 3.9(L) 3.7(L)  Hemoglobin 12.0 - 16.0 g/dL 10.8(L) 11.0(L) 10.9(L)  Hematocrit 35.0 - 47.0 % 32.8(L) 33.0(L) 33.2(L)  Platelets 150 - 440 K/uL 163 166.0 176.0    CXR: NNF   IMPRESSION:   COPD, moderate - Reports benefit from albuterol  Mild chronic cough due to chronic bronchitis   PLAN:  Continue Anoro inhaler daily (refilled this encounter)  Continue albuterol inhaler (refilled this encounter) as needed for increased shortness of breath, wheezing, cough, chest tightness  Follow-up as needed for any breathing, chest or respiratory symptoms   Merton Border, MD PCCM service Mobile [phone] Pager [phone] 11/22/2017 11:55 PM

## 2017-11-23 ENCOUNTER — Other Ambulatory Visit: Payer: Self-pay | Admitting: Internal Medicine

## 2017-11-24 ENCOUNTER — Inpatient Hospital Stay: Payer: Medicare Other | Attending: Oncology

## 2017-11-24 ENCOUNTER — Other Ambulatory Visit: Payer: Self-pay

## 2017-11-24 ENCOUNTER — Encounter: Payer: Self-pay | Admitting: Oncology

## 2017-11-24 ENCOUNTER — Inpatient Hospital Stay: Payer: Medicare Other | Attending: Oncology | Admitting: Oncology

## 2017-11-24 VITALS — BP 101/64 | HR 86 | Temp 97.3°F | Resp 18 | Wt 145.1 lb

## 2017-11-24 DIAGNOSIS — D649 Anemia, unspecified: Secondary | ICD-10-CM

## 2017-11-24 DIAGNOSIS — D72819 Decreased white blood cell count, unspecified: Secondary | ICD-10-CM

## 2017-11-24 DIAGNOSIS — N289 Disorder of kidney and ureter, unspecified: Secondary | ICD-10-CM | POA: Diagnosis not present

## 2017-11-24 LAB — CBC WITH DIFFERENTIAL/PLATELET
BASOS PCT: 1 %
Basophils Absolute: 0 10*3/uL (ref 0–0.1)
Eosinophils Absolute: 0.1 10*3/uL (ref 0–0.7)
Eosinophils Relative: 4 %
HEMATOCRIT: 33.4 % — AB (ref 35.0–47.0)
HEMOGLOBIN: 10.9 g/dL — AB (ref 12.0–16.0)
LYMPHS ABS: 0.8 10*3/uL — AB (ref 1.0–3.6)
LYMPHS PCT: 24 %
MCH: 27.7 pg (ref 26.0–34.0)
MCHC: 32.6 g/dL (ref 32.0–36.0)
MCV: 85 fL (ref 80.0–100.0)
MONO ABS: 0.3 10*3/uL (ref 0.2–0.9)
MONOS PCT: 8 %
NEUTROS ABS: 2.2 10*3/uL (ref 1.4–6.5)
NEUTROS PCT: 63 %
Platelets: 161 10*3/uL (ref 150–440)
RBC: 3.93 MIL/uL (ref 3.80–5.20)
RDW: 13.3 % (ref 11.5–14.5)
WBC: 3.4 10*3/uL — ABNORMAL LOW (ref 3.6–11.0)

## 2017-11-24 NOTE — Progress Notes (Signed)
Patient here for follow up

## 2017-12-02 ENCOUNTER — Ambulatory Visit: Payer: PRIVATE HEALTH INSURANCE

## 2017-12-07 ENCOUNTER — Other Ambulatory Visit: Payer: Self-pay | Admitting: Cardiovascular Disease

## 2017-12-15 DIAGNOSIS — R319 Hematuria, unspecified: Secondary | ICD-10-CM | POA: Diagnosis not present

## 2017-12-15 DIAGNOSIS — R809 Proteinuria, unspecified: Secondary | ICD-10-CM | POA: Diagnosis not present

## 2017-12-15 DIAGNOSIS — N183 Chronic kidney disease, stage 3 (moderate): Secondary | ICD-10-CM | POA: Diagnosis not present

## 2017-12-15 DIAGNOSIS — N390 Urinary tract infection, site not specified: Secondary | ICD-10-CM | POA: Diagnosis not present

## 2017-12-20 ENCOUNTER — Other Ambulatory Visit: Payer: Self-pay | Admitting: Nephrology

## 2017-12-20 DIAGNOSIS — R809 Proteinuria, unspecified: Secondary | ICD-10-CM

## 2017-12-20 DIAGNOSIS — R8281 Pyuria: Secondary | ICD-10-CM

## 2017-12-20 DIAGNOSIS — R319 Hematuria, unspecified: Secondary | ICD-10-CM

## 2017-12-20 DIAGNOSIS — N183 Chronic kidney disease, stage 3 unspecified: Secondary | ICD-10-CM

## 2017-12-27 ENCOUNTER — Ambulatory Visit
Admission: RE | Admit: 2017-12-27 | Discharge: 2017-12-27 | Disposition: A | Discharge status: Home / Self Care | Payer: Medicare Other | Source: Ambulatory Visit | Attending: Nephrology | Admitting: Nephrology

## 2017-12-27 DIAGNOSIS — R809 Proteinuria, unspecified: Secondary | ICD-10-CM | POA: Diagnosis not present

## 2017-12-27 DIAGNOSIS — R8281 Pyuria: Secondary | ICD-10-CM

## 2017-12-27 DIAGNOSIS — R319 Hematuria, unspecified: Secondary | ICD-10-CM

## 2017-12-27 DIAGNOSIS — N281 Cyst of kidney, acquired: Secondary | ICD-10-CM | POA: Diagnosis not present

## 2017-12-27 DIAGNOSIS — N183 Chronic kidney disease, stage 3 unspecified: Secondary | ICD-10-CM

## 2018-01-04 ENCOUNTER — Other Ambulatory Visit: Payer: Self-pay | Admitting: Cardiovascular Disease

## 2018-01-10 DIAGNOSIS — Z23 Encounter for immunization: Secondary | ICD-10-CM | POA: Diagnosis not present

## 2018-01-31 ENCOUNTER — Other Ambulatory Visit: Payer: Self-pay | Admitting: Cardiovascular Disease

## 2018-02-09 DIAGNOSIS — N183 Chronic kidney disease, stage 3 (moderate): Secondary | ICD-10-CM | POA: Diagnosis not present

## 2018-02-09 DIAGNOSIS — N390 Urinary tract infection, site not specified: Secondary | ICD-10-CM | POA: Diagnosis not present

## 2018-02-09 DIAGNOSIS — R319 Hematuria, unspecified: Secondary | ICD-10-CM | POA: Diagnosis not present

## 2018-02-14 ENCOUNTER — Ambulatory Visit (INDEPENDENT_AMBULATORY_CARE_PROVIDER_SITE_OTHER): Payer: Medicare Other

## 2018-02-14 ENCOUNTER — Ambulatory Visit (INDEPENDENT_AMBULATORY_CARE_PROVIDER_SITE_OTHER): Payer: Medicare Other | Admitting: Internal Medicine

## 2018-02-14 ENCOUNTER — Encounter: Payer: Self-pay | Admitting: Internal Medicine

## 2018-02-14 VITALS — BP 110/64 | HR 85 | Temp 98.1°F | Resp 15 | Wt 145.0 lb

## 2018-02-14 VITALS — BP 110/64 | HR 85 | Temp 98.1°F | Resp 15 | Ht 64.0 in | Wt 145.8 lb

## 2018-02-14 DIAGNOSIS — R197 Diarrhea, unspecified: Secondary | ICD-10-CM

## 2018-02-14 DIAGNOSIS — Z0000 Encounter for general adult medical examination without abnormal findings: Secondary | ICD-10-CM | POA: Diagnosis not present

## 2018-02-14 DIAGNOSIS — D649 Anemia, unspecified: Secondary | ICD-10-CM | POA: Diagnosis not present

## 2018-02-14 DIAGNOSIS — I10 Essential (primary) hypertension: Secondary | ICD-10-CM | POA: Diagnosis not present

## 2018-02-14 DIAGNOSIS — K2100 Gastro-esophageal reflux disease with esophagitis, without bleeding: Secondary | ICD-10-CM

## 2018-02-14 DIAGNOSIS — N183 Chronic kidney disease, stage 3 unspecified: Secondary | ICD-10-CM

## 2018-02-14 DIAGNOSIS — R0902 Hypoxemia: Secondary | ICD-10-CM | POA: Diagnosis not present

## 2018-02-14 DIAGNOSIS — E7800 Pure hypercholesterolemia, unspecified: Secondary | ICD-10-CM

## 2018-02-14 DIAGNOSIS — K2270 Barrett's esophagus without dysplasia: Secondary | ICD-10-CM | POA: Diagnosis not present

## 2018-02-14 DIAGNOSIS — R739 Hyperglycemia, unspecified: Secondary | ICD-10-CM

## 2018-02-14 DIAGNOSIS — J449 Chronic obstructive pulmonary disease, unspecified: Secondary | ICD-10-CM

## 2018-02-14 LAB — HEPATIC FUNCTION PANEL
ALBUMIN: 4.2 g/dL (ref 3.5–5.2)
ALT: 15 U/L (ref 0–35)
AST: 21 U/L (ref 0–37)
Alkaline Phosphatase: 56 U/L (ref 39–117)
BILIRUBIN TOTAL: 0.5 mg/dL (ref 0.2–1.2)
Bilirubin, Direct: 0.1 mg/dL (ref 0.0–0.3)
Total Protein: 6.6 g/dL (ref 6.0–8.3)

## 2018-02-14 LAB — BASIC METABOLIC PANEL
BUN: 18 mg/dL (ref 6–23)
CHLORIDE: 103 meq/L (ref 96–112)
CO2: 31 meq/L (ref 19–32)
Calcium: 10.1 mg/dL (ref 8.4–10.5)
Creatinine, Ser: 1.41 mg/dL — ABNORMAL HIGH (ref 0.40–1.20)
GFR: 37.79 mL/min — ABNORMAL LOW (ref >60.00)
Glucose, Bld: 120 mg/dL — ABNORMAL HIGH (ref 70–99)
POTASSIUM: 4.9 meq/L (ref 3.5–5.1)
SODIUM: 140 meq/L (ref 135–145)

## 2018-02-14 LAB — LIPID PANEL
CHOLESTEROL: 115 mg/dL (ref 0–200)
HDL: 47.1 mg/dL (ref >39.00)
LDL Cholesterol: 45 mg/dL (ref 0–99)
NonHDL: 67.72
TRIGLYCERIDES: 115 mg/dL (ref 0.0–149.0)
Total CHOL/HDL Ratio: 2
VLDL: 23 mg/dL (ref 0.0–40.0)

## 2018-02-14 LAB — TSH: TSH: 1.02 u[IU]/mL (ref 0.35–4.50)

## 2018-02-14 LAB — HEMOGLOBIN A1C: Hgb A1c MFr Bld: 5.9 % (ref 4.6–6.5)

## 2018-02-14 NOTE — Patient Instructions (Addendum)
  Ms. Kozma , Thank you for taking time to come for your Medicare Wellness Visit. I appreciate your ongoing commitment to your health goals. Please review the following plan we discussed and let me know if I can assist you in the future.   These are the goals we discussed: Goals    . DIET - INCREASE WATER INTAKE     Stay hydrated       This is a list of the screening recommended for you and due dates:  Health Maintenance  Topic Date Due  . DEXA scan (bone density measurement)  05/09/1999  . Mammogram  09/08/2018  . Colon Cancer Screening  05/08/2019  . Tetanus Vaccine  11/15/2022  . Flu Shot  Completed  . Pneumonia vaccines  Completed

## 2018-02-14 NOTE — Progress Notes (Signed)
Patient ID: Anita Ferrell, female   DOB: 04-Apr-1933, 84 y.o.   MRN: [id number]   Subjective:    Patient ID: Anita Ferrell, female    DOB: [birth year], 84 y.o.   MRN: [id number]  HPI  Patient here for a scheduled follow up.  She is s/p hemorrhoid - banding.  States did not work.  Discussed referral back to surgery. She wants to follow.  States Dr Alva Garnet released her.  Breathing stable.  No chest pain.  No acid reflux.  No abdominal pain. Bowels are moving.  Has some loose stool.  Reports persistent diarrhea.  State has been present for a few weeks now.  May have 2-3 episodes per day.  Only taking furosemide prn.  Had renal ultrasound 02/09/18.  Unremarkable.  Being referred to urology  - for hematuria w/up.  Saw Dr Grayland Ormond 11/24/17.  Stable. Recommended f/u in 6 months.      Past Medical History:  Diagnosis Date  . Allergy   . Asthma   . COPD (chronic obstructive pulmonary disease) ([place])   . GERD (gastroesophageal reflux disease)   . Hypercholesteremia   . Hypertension   . Mitral valve insufficiency   . Pulmonary hypertension (HCC)    Right Ventricular systolic pressure at 60 mm Hg by echo on july of 2011; Right heart cardic catherization revealed pulmonary artery pressusre of 27/10 with a mean of; Ventricular pressure 29/10 with pulmonary capillary wedge at 9  . TB (pulmonary tuberculosis)    treated with INH therapy    Past Surgical History:  Procedure Laterality Date  . CARDIAC CATHETERIZATION  10/2016   [facility]  . CATARACT EXTRACTION Bilateral 2008  . CHOLECYSTECTOMY    . TONSILLECTOMY  1964   Family History  Problem Relation Age of Onset  . Heart disease Mother   . Heart disease Father   . Hypertension Sister   . Heart disease Brother   . Breast cancer Maternal Aunt    Social History   Socioeconomic History  . Marital status: Widowed    Spouse name: Not on file  . Number of children: 3  . Years of education: Not on file  . Highest education level: Not on file    Occupational History  . Not on file  Social Needs  . Financial resource strain: Not hard at all  . Food insecurity:    Worry: Never true    Inability: Never true  . Transportation needs:    Medical: No    Non-medical: No  Tobacco Use  . Smoking status: Former Smoker    Packs/day: 1.00    Years: 40.00    Pack years: 40.00    Types: Cigarettes    Last attempt to quit: 08/17/1990    Years since quitting: 27.5  . Smokeless tobacco: Never Used  . Tobacco comment: Quit in 1992  Substance and Sexual Activity  . Alcohol use: No    Alcohol/week: 0.0 standard drinks  . Drug use: No  . Sexual activity: Never  Lifestyle  . Physical activity:    Days per week: 0 days    Minutes per session: Not on file  . Stress: Not at all  Relationships  . Social connections:    Talks on phone: Not on file    Gets together: Not on file    Attends religious service: Not on file    Active member of club or organization: Not on file    Attends meetings of clubs or organizations: Not on  file    Relationship status: Not on file  Other Topics Concern  . Not on file  Social History Narrative  . Not on file    Outpatient Encounter Medications as of 02/14/2018  Medication Sig  . acetaminophen (TYLENOL) 500 MG tablet Take 500 mg by mouth every 6 (six) hours as needed.  Marland Kitchen albuterol (VENTOLIN HFA) 108 (90 Base) MCG/ACT inhaler INHALE TWO PUFFS EVERY FOUR HOURS AS NEEDED. FOR WHEEZE/SHORTNESS OF BREATH  . aspirin EC 81 MG tablet Take 81 mg by mouth daily.   Marland Kitchen azelastine (ASTELIN) 0.1 % nasal spray USE 1 SPRAY EACH NOSTRIL TWO TIMES DAILY  . Calcium Carbonate-Vitamin D (CALCIUM 600+D) 600-400 MG-UNIT tablet Take 1 tablet by mouth at bedtime.  Marland Kitchen ENTRESTO 24-26 MG TAKE 1 TABLET BY MOUTH TWICE A DAY  . ferrous sulfate 325 (65 FE) MG tablet Take 325 mg by mouth 2 (two) times daily with a meal.   . fexofenadine (ALLEGRA) 180 MG tablet Take 180 mg by mouth daily.   . furosemide (LASIX) 40 MG tablet TAKE 1  TABLET BY MOUTH EVERY OTHER DAY  . lansoprazole (PREVACID) 30 MG capsule Take 30 mg by mouth daily.   . meclizine (ANTIVERT) 25 MG tablet Take 25 mg by mouth 3 (three) times daily as needed for dizziness. Reported on 10/15/2015  . metoprolol tartrate (LOPRESSOR) 50 MG tablet TAKE 1/2 TABLET TWICE A DAY  . Multiple Vitamins-Minerals (CENTRUM SILVER PO) Take by mouth.  . Probiotic Product (ALIGN PO) Take by mouth.  . rosuvastatin (CRESTOR) 10 MG tablet TAKE 1 TABLET BY MOUTH EVERY DAY  . spironolactone (ALDACTONE) 25 MG tablet TAKE 1/2 TABLET BY MOUTH EVERY DAY  . umeclidinium-vilanterol (ANORO ELLIPTA) 62.5-25 MCG/INH AEPB TAKE 1 PUFF BY MOUTH INTO THE LUNGS DAILY  . [DISCONTINUED] docusate sodium (COLACE) 100 MG capsule Take by mouth.   No facility-administered encounter medications on file as of 02/14/2018.     Review of Systems  Constitutional: Negative for appetite change and unexpected weight change.  HENT: Negative for congestion and sinus pressure.   Respiratory: Negative for cough and chest tightness.        Breathing stable.    Cardiovascular: Negative for chest pain, palpitations and leg swelling.  Gastrointestinal: Positive for diarrhea. Negative for abdominal pain, nausea and vomiting.  Genitourinary: Negative for difficulty urinating and dysuria.  Musculoskeletal: Negative for joint swelling and myalgias.  Skin: Negative for color change and rash.  Neurological: Negative for dizziness, light-headedness and headaches.  Psychiatric/Behavioral: Negative for agitation and dysphoric mood.       Objective:    Physical Exam  Constitutional: She appears well-developed and well-nourished. No distress.  HENT:  Nose: Nose normal.  Mouth/Throat: Oropharynx is clear and moist.  Neck: Neck supple. No thyromegaly present.  Cardiovascular: Normal rate and regular rhythm.  Pulmonary/Chest: Breath sounds normal. No respiratory distress. She has no wheezes.  Abdominal: Soft. Bowel  sounds are normal. There is no tenderness.  Musculoskeletal: She exhibits no edema or tenderness.  Lymphadenopathy:    She has no cervical adenopathy.  Skin: No rash noted. No erythema.  Psychiatric: She has a normal mood and affect. Her behavior is normal.    BP 110/64 (BP Location: Left Arm, Patient Position: Sitting, Cuff Size: Normal)   Pulse 85   Temp 98.1 F (36.7 C) (Oral)   Resp 15   Wt 145 lb (65.8 kg)   SpO2 95%   BMI 24.89 kg/m  Wt Readings from Last 3 Encounters:  02/14/18 145 lb (65.8 kg)  02/14/18 145 lb 12.8 oz (66.1 kg)  11/24/17 145 lb 1.6 oz (65.8 kg)     Lab Results  Component Value Date   WBC 3.4 (L) 11/24/2017   HGB 10.9 (L) 11/24/2017   HCT 33.4 (L) 11/24/2017   PLT 161 11/24/2017   GLUCOSE 120 (H) 02/14/2018   CHOL 115 02/14/2018   TRIG 115.0 02/14/2018   HDL 47.10 02/14/2018   LDLDIRECT 127.1 08/25/2012   LDLCALC 45 02/14/2018   ALT 15 02/14/2018   AST 21 02/14/2018   NA 140 02/14/2018   K 4.9 02/14/2018   CL 103 02/14/2018   CREATININE 1.41 (H) 02/14/2018   BUN 18 02/14/2018   CO2 31 02/14/2018   TSH 1.02 02/14/2018   HGBA1C 5.9 02/14/2018    US Renal  Result Date: 12/27/2017 CLINICAL DATA:  Initial evaluation for chronic kidney disease stage 3. EXAM: RENAL / URINARY TRACT ULTRASOUND COMPLETE COMPARISON:  Prior ultrasound from 03/13/2014. FINDINGS: Right Kidney: Length: 8.3 cm. Increased echogenicity within the renal parenchyma with cortical thinning. No mass or hydronephrosis visualized. Left Kidney: Length: 8.4 cm. Increased echogenicity within the renal parenchyma with cortical thinning. No hydronephrosis. 1.2 x 0.7 x 1.1 cm cyst present at the lower pole. Additional 1.2 x 1.1 x 1.2 cm anechoic cystic lesion present within the adjacent lower pole. No significant internal complexity. Bladder: Appears normal for degree of bladder distention. IMPRESSION: 1. Increased echogenicity within the renal parenchyma with associated cortical thinning,  compatible with chronic medical renal disease. 2. No hydronephrosis. 3. Small left renal cysts as above. Electronically Signed   By: Jeannine Boga M.D.   On: 12/27/2017 23:34       Assessment & Plan:   Problem List Items Addressed This Visit    Anemia    Being followed by hematology.  Last evaluated 11/24/17.  Recommended f/u in 6 months.        Barrett's esophagus    Followed by Dr Gustavo Lah.  Upper symptoms controlled.        CKD (chronic kidney disease) stage 3, GFR 30-59 ml/min ([place])    Saw nephrology.  Renal ultrasound unremarkable.  Planning to see urology.  Follow metabolic panel.  Avoid antiinflammatories.        COPD with hypoxia [facility])    Dr Alva Garnet released.  Breathing stable.  On no oxygen.        Diarrhea - Primary    Persistent.  Check stool studies.        Relevant Orders   Gastrointestinal Pathogen Panel PCR   Essential hypertension    Blood pressure under good control.  Continue same medication regimen.  Follow pressures.  Follow metabolic panel.        Relevant Orders   TSH (Completed)   Basic metabolic panel (Completed)   GERD (gastroesophageal reflux disease)    Controlled on current regimen.  Follow.        Hypercholesterolemia    On crestor.  Low cholesterol diet and exercise.  Follow lipid panel and liver function tests.        Relevant Orders   Hepatic function panel (Completed)   Lipid panel (Completed)   Hyperglycemia    Low carb diet and exercise.  Follow met b and a1c.        Relevant Orders   Hemoglobin A1c (Completed)       [name], MD

## 2018-02-14 NOTE — Progress Notes (Signed)
Subjective:   Anita Ferrell is a 84 y.o. female who presents for an Initial Medicare Annual Wellness Visit.  Review of Systems    No ROS.  Medicare Wellness Visit. Additional risk factors are reflected in the social history.  Cardiac Risk Factors include: advanced age (>81men, >54 women);hypertension     Objective:    Today's Vitals   02/14/18 1025  BP: 110/64  Pulse: 85  Resp: 15  Temp: 98.1 F (36.7 C)  TempSrc: Oral  SpO2: 95%  Weight: 145 lb 12.8 oz (66.1 kg)  Height: 5\' 4"  (1.626 m)   Body mass index is 25.03 kg/m.  Advanced Directives 02/14/2018 11/24/2017 08/16/2017 12/01/2016 03/11/2015 03/11/2015  Does Patient Have a Medical Advance Directive? No No No No No No  Would patient like information on creating a medical advance directive? No - Patient declined - No - Patient declined No - Patient declined No - patient declined information No - patient declined information    Current Medications (verified) Outpatient Encounter Medications as of 02/14/2018  Medication Sig  . acetaminophen (TYLENOL) 500 MG tablet Take 500 mg by mouth every 6 (six) hours as needed.  Marland Kitchen albuterol (VENTOLIN HFA) 108 (90 Base) MCG/ACT inhaler INHALE TWO PUFFS EVERY FOUR HOURS AS NEEDED. FOR WHEEZE/SHORTNESS OF BREATH  . aspirin EC 81 MG tablet Take 81 mg by mouth daily.   Marland Kitchen azelastine (ASTELIN) 0.1 % nasal spray USE 1 SPRAY EACH NOSTRIL TWO TIMES DAILY  . Calcium Carbonate-Vitamin D (CALCIUM 600+D) 600-400 MG-UNIT tablet Take 1 tablet by mouth at bedtime.  Marland Kitchen ENTRESTO 24-26 MG TAKE 1 TABLET BY MOUTH TWICE A DAY  . ferrous sulfate 325 (65 FE) MG tablet Take 325 mg by mouth 2 (two) times daily with a meal.   . fexofenadine (ALLEGRA) 180 MG tablet Take 180 mg by mouth daily.   . furosemide (LASIX) 40 MG tablet TAKE 1 TABLET BY MOUTH EVERY OTHER DAY  . lansoprazole (PREVACID) 30 MG capsule Take 30 mg by mouth daily.   . meclizine (ANTIVERT) 25 MG tablet Take 25 mg by mouth 3 (three) times  daily as needed for dizziness. Reported on 10/15/2015  . metoprolol tartrate (LOPRESSOR) 50 MG tablet TAKE 1/2 TABLET TWICE A DAY  . Multiple Vitamins-Minerals (CENTRUM SILVER PO) Take by mouth.  . Probiotic Product (ALIGN PO) Take by mouth.  . rosuvastatin (CRESTOR) 10 MG tablet TAKE 1 TABLET BY MOUTH EVERY DAY  . spironolactone (ALDACTONE) 25 MG tablet TAKE 1/2 TABLET BY MOUTH EVERY DAY  . umeclidinium-vilanterol (ANORO ELLIPTA) 62.5-25 MCG/INH AEPB TAKE 1 PUFF BY MOUTH INTO THE LUNGS DAILY  . [DISCONTINUED] docusate sodium (COLACE) 100 MG capsule Take by mouth.   No facility-administered encounter medications on file as of 02/14/2018.     Allergies (verified) Penicillin g and Penicillins   History: Past Medical History:  Diagnosis Date  . Allergy   . Asthma   . COPD (chronic obstructive pulmonary disease) ([place])   . GERD (gastroesophageal reflux disease)   . Hypercholesteremia   . Hypertension   . Mitral valve insufficiency   . Pulmonary hypertension (HCC)    Right Ventricular systolic pressure at 60 mm Hg by echo on july of 2011; Right heart cardic catherization revealed pulmonary artery pressusre of 27/10 with a mean of; Ventricular pressure 29/10 with pulmonary capillary wedge at 9  . TB (pulmonary tuberculosis)    treated with INH therapy    Past Surgical History:  Procedure Laterality Date  . CARDIAC CATHETERIZATION  10/2016   [facility]  . CATARACT EXTRACTION Bilateral 2008  . CHOLECYSTECTOMY    . TONSILLECTOMY  1964   Family History  Problem Relation Age of Onset  . Heart disease Mother   . Heart disease Father   . Hypertension Sister   . Heart disease Brother   . Breast cancer Maternal Aunt    Social History   Socioeconomic History  . Marital status: Widowed    Spouse name: Not on file  . Number of children: 3  . Years of education: Not on file  . Highest education level: Not on file  Occupational History  . Not on file  Social Needs  . Financial resource  strain: Not hard at all  . Food insecurity:    Worry: Never true    Inability: Never true  . Transportation needs:    Medical: No    Non-medical: No  Tobacco Use  . Smoking status: Former Smoker    Packs/day: 1.00    Years: 40.00    Pack years: 40.00    Types: Cigarettes    Last attempt to quit: 08/17/1990    Years since quitting: 27.5  . Smokeless tobacco: Never Used  . Tobacco comment: Quit in 1992  Substance and Sexual Activity  . Alcohol use: No    Alcohol/week: 0.0 standard drinks  . Drug use: No  . Sexual activity: Never  Lifestyle  . Physical activity:    Days per week: 0 days    Minutes per session: Not on file  . Stress: Not at all  Relationships  . Social connections:    Talks on phone: Not on file    Gets together: Not on file    Attends religious service: Not on file    Active member of club or organization: Not on file    Attends meetings of clubs or organizations: Not on file    Relationship status: Not on file  Other Topics Concern  . Not on file  Social History Narrative  . Not on file    Tobacco Counseling Counseling given: Not Answered Comment: Quit in 1992   Clinical Intake:  Pre-visit preparation completed: Yes  Pain : No/denies pain     Nutritional Status: BMI of 19-24  Normal Diabetes: No  How often do you need to have someone help you when you read instructions, pamphlets, or other written materials from your doctor or pharmacy?: 1 - Never  Interpreter Needed?: No      Activities of Daily Living In your present state of health, do you have any difficulty performing the following activities: 02/14/2018  Hearing? N  Vision? N  Difficulty concentrating or making decisions? N  Walking or climbing stairs? Y  Comment Unsteady gait  Dressing or bathing? N  Doing errands, shopping? Y  Comment She does not Physiological scientist and eating ? Y  Using the Toilet? N  In the past six months, have you accidently leaked urine? Y    Comment Managed with a daily brief  Do you have problems with loss of bowel control? N  Managing your Medications? Y  Comment Son assists as needed  Managing your Finances? Y  Comment Son assists  Housekeeping or managing your Housekeeping? Y  Comment Son assists  Some recent data might be hidden     Immunizations and Health Maintenance Immunization History  Administered Date(s) Administered  . Influenza, High Dose Seasonal PF 01/13/2016, 01/25/2017, 01/10/2018  . Influenza,inj,Quad PF,6+ Mos 12/26/2012, 01/12/2014, 01/14/2015  .  Pneumococcal Conjugate-13 09/02/2014  . Pneumococcal Polysaccharide-23 11/14/2012  . Tdap 11/14/2012   Health Maintenance Due  Topic Date Due  . DEXA SCAN  05/09/1999    Patient Care Team: [name], MD as PCP - General (Internal Medicine)  Indicate any recent Medical Services you may have received from other than [facility] providers in the past year (date may be approximate).     Assessment:   This is a routine wellness examination for [place].  The goal of the wellness visit is to assist the patient how to close the gaps in care and create a preventative care plan for the patient.   The roster of all physicians providing medical care to patient is listed in the Snapshot section of the chart.  Taking calcium VIT D as appropriate/Osteoporosis risk reviewed.  Dexa scan discussed.   Safety issues reviewed; Lives with her son. Smoke and carbon monoxide detectors in the home. No firearms in the home. Wears seatbelts when driving or riding with others. No violence in the home.  They do not have excessive sun exposure.  Discussed the need for sun protection: hats, long sleeves and the use of sunscreen if there is significant sun exposure.  BMI- discussed the importance of a healthy diet, water intake and the benefits of aerobic exercise. Her son prepares the meals and they try to have a healthy diet.  She is active around the home and plans to  increase her water intake.   Dental- UTD.  Eye- Visual acuity not assessed per patient preference.   Sleep patterns- Sleeps fair at night.   Hearing/Vision screen Hearing Screening Comments: Patient is able to hear conversational tones without difficulty.  No issues reported.   Vision Screening Comments: Followed by Dr. Gloriann Loan  Wears corrective lenses when reading  Cataract extraction, bilateral   Dietary issues and exercise activities discussed: Current Exercise Habits: The patient does not participate in regular exercise at present  Goals    . DIET - INCREASE WATER INTAKE     Stay hydrated      Depression Screen PHQ 2/9 Scores 02/14/2018 12/01/2016 02/24/2016 01/13/2016 10/15/2015 09/02/2014 08/27/2013  PHQ - 2 Score 0 0 0 0 0 0 0    Fall Risk Fall Risk  02/14/2018 12/01/2016 02/24/2016 01/13/2016 10/15/2015  Falls in the past year? No No No No No   Cognitive Function:     6CIT Screen 02/14/2018 12/01/2016  What Year? 0 points 0 points  What month? 0 points 0 points  What time? 0 points 0 points  Count back from 20 0 points 0 points  Months in reverse 0 points 0 points  Repeat phrase 0 points 0 points  Total Score 0 0    Screening Tests Health Maintenance  Topic Date Due  . DEXA SCAN  05/09/1999  . MAMMOGRAM  09/08/2018  . COLONOSCOPY  05/08/2019  . TETANUS/TDAP  11/15/2022  . INFLUENZA VACCINE  Completed  . PNA vac Low Risk Adult  Completed     Plan:   End of life planning; Advanced aging; Advanced directives discussed.  No HCPOA/Living Will.  Additional information declined at this time.  I have personally reviewed and noted the following in the patient's chart:   . Medical and social history . Use of alcohol, tobacco or illicit drugs  . Current medications and supplements . Functional ability and status . Nutritional status . Physical activity . Advanced directives . List of other physicians . Hospitalizations, surgeries, and ER visits in previous 18  months . Vitals . Screenings to include cognitive, depression, and falls . Referrals and appointments  In addition, I have reviewed and discussed with patient certain preventive protocols, quality metrics, and best practice recommendations. A written personalized care plan for preventive services as well as general preventive health recommendations were provided to patient.     Varney Biles, LPN   [id number]    Reviewed above information.  Agree with assessment and plan.     Dr Nicki Reaper

## 2018-02-15 ENCOUNTER — Encounter: Payer: Self-pay | Admitting: Internal Medicine

## 2018-02-19 ENCOUNTER — Encounter: Payer: Self-pay | Admitting: Internal Medicine

## 2018-02-19 NOTE — Assessment & Plan Note (Signed)
Followed by Dr Gustavo Lah.  Upper symptoms controlled.

## 2018-02-19 NOTE — Assessment & Plan Note (Signed)
Saw nephrology.  Renal ultrasound unremarkable.  Planning to see urology.  Follow metabolic panel.  Avoid antiinflammatories.

## 2018-02-19 NOTE — Assessment & Plan Note (Signed)
Controlled on current regimen.  Follow.  

## 2018-02-19 NOTE — Assessment & Plan Note (Signed)
Low carb diet and exercise.  Follow met b and a1c.   

## 2018-02-19 NOTE — Assessment & Plan Note (Signed)
Blood pressure under good control.  Continue same medication regimen.  Follow pressures.  Follow metabolic panel.   

## 2018-02-19 NOTE — Assessment & Plan Note (Signed)
Persistent.  Check stool studies.

## 2018-02-19 NOTE — Assessment & Plan Note (Signed)
Dr Alva Garnet released.  Breathing stable.  On no oxygen.

## 2018-02-19 NOTE — Assessment & Plan Note (Signed)
Being followed by hematology.  Last evaluated 11/24/17.  Recommended f/u in 6 months.

## 2018-02-19 NOTE — Assessment & Plan Note (Signed)
On crestor.  Low cholesterol diet and exercise.  Follow lipid panel and liver function tests.   

## 2018-02-21 LAB — GASTROINTESTINAL PATHOGEN PANEL PCR
C. DIFFICILE TOX A/B, PCR: NOT DETECTED
CAMPYLOBACTER, PCR: NOT DETECTED
CRYPTOSPORIDIUM, PCR: NOT DETECTED
E COLI (ETEC) LT/ST, PCR: NOT DETECTED
E coli (STEC) stx1/stx2, PCR: NOT DETECTED
E coli 0157, PCR: NOT DETECTED
GIARDIA LAMBLIA, PCR: NOT DETECTED
Norovirus, PCR: NOT DETECTED
Rotavirus A, PCR: NOT DETECTED
SHIGELLA, PCR: NOT DETECTED
Salmonella, PCR: NOT DETECTED

## 2018-03-06 ENCOUNTER — Other Ambulatory Visit: Payer: Self-pay

## 2018-03-15 ENCOUNTER — Ambulatory Visit (INDEPENDENT_AMBULATORY_CARE_PROVIDER_SITE_OTHER): Payer: Medicare Other | Admitting: Internal Medicine

## 2018-03-15 ENCOUNTER — Encounter: Payer: Self-pay | Admitting: Internal Medicine

## 2018-03-15 DIAGNOSIS — K2100 Gastro-esophageal reflux disease with esophagitis, without bleeding: Secondary | ICD-10-CM

## 2018-03-15 DIAGNOSIS — D649 Anemia, unspecified: Secondary | ICD-10-CM

## 2018-03-15 DIAGNOSIS — K649 Unspecified hemorrhoids: Secondary | ICD-10-CM | POA: Diagnosis not present

## 2018-03-15 DIAGNOSIS — J449 Chronic obstructive pulmonary disease, unspecified: Secondary | ICD-10-CM

## 2018-03-15 DIAGNOSIS — I10 Essential (primary) hypertension: Secondary | ICD-10-CM

## 2018-03-15 DIAGNOSIS — K2270 Barrett's esophagus without dysplasia: Secondary | ICD-10-CM | POA: Diagnosis not present

## 2018-03-15 DIAGNOSIS — N183 Chronic kidney disease, stage 3 unspecified: Secondary | ICD-10-CM

## 2018-03-15 DIAGNOSIS — R0902 Hypoxemia: Secondary | ICD-10-CM

## 2018-03-15 DIAGNOSIS — R739 Hyperglycemia, unspecified: Secondary | ICD-10-CM | POA: Diagnosis not present

## 2018-03-15 DIAGNOSIS — E7800 Pure hypercholesterolemia, unspecified: Secondary | ICD-10-CM | POA: Diagnosis not present

## 2018-03-15 NOTE — Progress Notes (Signed)
Patient ID: Anita Ferrell, female   DOB: 09/22/1932, 84 y.o.   MRN: [id number]   Subjective:    Patient ID: Anita Ferrell, female    DOB: [birth year], 84 y.o.   MRN: [id number]  HPI  Patient here for a scheduled follow up.  States she is doing well.  Feels good. Diarrhea - not an issue.  No chest pain.  Breathing stable. No acid reflux.  No abdominal pain.  Bowels moving.  Sees Dr Grayland Ormond for anemia.  Last EGD and colonoscopy 2016.  Mild renal insufficiency.  Felt stable.  Recommended f/u in 6 months.  Also sees Dr Alva Garnet for COPD.  Recommended continuing albuterol inhaler prn.  Overall breathing doing well.  She feels good.     Past Medical History:  Diagnosis Date  . Allergy   . Asthma   . COPD (chronic obstructive pulmonary disease) ([place])   . GERD (gastroesophageal reflux disease)   . Hypercholesteremia   . Hypertension   . Mitral valve insufficiency   . Pulmonary hypertension (HCC)    Right Ventricular systolic pressure at 60 mm Hg by echo on july of 2011; Right heart cardic catherization revealed pulmonary artery pressusre of 27/10 with a mean of; Ventricular pressure 29/10 with pulmonary capillary wedge at 9  . TB (pulmonary tuberculosis)    treated with INH therapy    Past Surgical History:  Procedure Laterality Date  . CARDIAC CATHETERIZATION  10/2016   [facility]  . CATARACT EXTRACTION Bilateral 2008  . CHOLECYSTECTOMY    . TONSILLECTOMY  1964   Family History  Problem Relation Age of Onset  . Heart disease Mother   . Heart disease Father   . Hypertension Sister   . Heart disease Brother   . Breast cancer Maternal Aunt    Social History   Socioeconomic History  . Marital status: Widowed    Spouse name: Not on file  . Number of children: 3  . Years of education: Not on file  . Highest education level: Not on file  Occupational History  . Not on file  Social Needs  . Financial resource strain: Not hard at all  . Food insecurity:    Worry: Never true   Inability: Never true  . Transportation needs:    Medical: No    Non-medical: No  Tobacco Use  . Smoking status: Former Smoker    Packs/day: 1.00    Years: 40.00    Pack years: 40.00    Types: Cigarettes    Last attempt to quit: 08/17/1990    Years since quitting: 27.6  . Smokeless tobacco: Never Used  . Tobacco comment: Quit in 1992  Substance and Sexual Activity  . Alcohol use: No    Alcohol/week: 0.0 standard drinks  . Drug use: No  . Sexual activity: Never  Lifestyle  . Physical activity:    Days per week: 0 days    Minutes per session: Not on file  . Stress: Not at all  Relationships  . Social connections:    Talks on phone: Not on file    Gets together: Not on file    Attends religious service: Not on file    Active member of club or organization: Not on file    Attends meetings of clubs or organizations: Not on file    Relationship status: Not on file  Other Topics Concern  . Not on file  Social History Narrative  . Not on file    Outpatient Encounter  Medications as of 03/15/2018  Medication Sig  . acetaminophen (TYLENOL) 500 MG tablet Take 500 mg by mouth every 6 (six) hours as needed.  Marland Kitchen albuterol (VENTOLIN HFA) 108 (90 Base) MCG/ACT inhaler INHALE TWO PUFFS EVERY FOUR HOURS AS NEEDED. FOR WHEEZE/SHORTNESS OF BREATH  . aspirin EC 81 MG tablet Take 81 mg by mouth daily.   Marland Kitchen azelastine (ASTELIN) 0.1 % nasal spray USE 1 SPRAY EACH NOSTRIL TWO TIMES DAILY  . Calcium Carbonate-Vitamin D (CALCIUM 600+D) 600-400 MG-UNIT tablet Take 1 tablet by mouth at bedtime.  Marland Kitchen ENTRESTO 24-26 MG TAKE 1 TABLET BY MOUTH TWICE A DAY  . ferrous sulfate 325 (65 FE) MG tablet Take 325 mg by mouth 2 (two) times daily with a meal.   . fexofenadine (ALLEGRA) 180 MG tablet Take 180 mg by mouth daily.   . furosemide (LASIX) 40 MG tablet TAKE 1 TABLET BY MOUTH EVERY OTHER DAY  . lansoprazole (PREVACID) 30 MG capsule Take 30 mg by mouth daily.   . meclizine (ANTIVERT) 25 MG tablet Take 25 mg  by mouth 3 (three) times daily as needed for dizziness. Reported on 10/15/2015  . metoprolol tartrate (LOPRESSOR) 50 MG tablet TAKE 1/2 TABLET TWICE A DAY  . Multiple Vitamins-Minerals (CENTRUM SILVER PO) Take by mouth.  . Probiotic Product (ALIGN PO) Take by mouth.  . rosuvastatin (CRESTOR) 10 MG tablet TAKE 1 TABLET BY MOUTH EVERY DAY  . spironolactone (ALDACTONE) 25 MG tablet TAKE 1/2 TABLET BY MOUTH EVERY DAY  . umeclidinium-vilanterol (ANORO ELLIPTA) 62.5-25 MCG/INH AEPB TAKE 1 PUFF BY MOUTH INTO THE LUNGS DAILY   No facility-administered encounter medications on file as of 03/15/2018.     Review of Systems  Constitutional: Negative for appetite change and unexpected weight change.  HENT: Negative for congestion and sinus pressure.   Respiratory: Negative for cough, chest tightness and shortness of breath.   Cardiovascular: Negative for chest pain, palpitations and leg swelling.  Gastrointestinal: Negative for abdominal pain, diarrhea, nausea and vomiting.  Genitourinary: Negative for difficulty urinating and dysuria.  Musculoskeletal: Negative for joint swelling and myalgias.  Skin: Negative for color change and rash.  Neurological: Negative for dizziness, light-headedness and headaches.  Psychiatric/Behavioral: Negative for agitation and dysphoric mood.       Objective:    Physical Exam  Constitutional: She appears well-developed and well-nourished. No distress.  HENT:  Nose: Nose normal.  Mouth/Throat: Oropharynx is clear and moist.  Neck: Neck supple. No thyromegaly present.  Cardiovascular: Normal rate and regular rhythm.  Pulmonary/Chest: Breath sounds normal. No respiratory distress. She has no wheezes.  Abdominal: Soft. Bowel sounds are normal. There is no tenderness.  Musculoskeletal: She exhibits no edema or tenderness.  Lymphadenopathy:    She has no cervical adenopathy.  Skin: No rash noted. No erythema.  Psychiatric: She has a normal mood and affect. Her  behavior is normal.    BP 118/66 (BP Location: Left Arm, Patient Position: Sitting, Cuff Size: Normal)   Pulse 88   Temp (!) 97.5 F (36.4 C) (Oral)   Resp 18   Wt 147 lb 3.2 oz (66.8 kg)   SpO2 96%   BMI 25.27 kg/m  Wt Readings from Last 3 Encounters:  03/15/18 147 lb 3.2 oz (66.8 kg)  02/14/18 145 lb (65.8 kg)  02/14/18 145 lb 12.8 oz (66.1 kg)     Lab Results  Component Value Date   WBC 3.4 (L) 11/24/2017   HGB 10.9 (L) 11/24/2017   HCT 33.4 (L) 11/24/2017  PLT 161 11/24/2017   GLUCOSE 120 (H) 02/14/2018   CHOL 115 02/14/2018   TRIG 115.0 02/14/2018   HDL 47.10 02/14/2018   LDLDIRECT 127.1 08/25/2012   LDLCALC 45 02/14/2018   ALT 15 02/14/2018   AST 21 02/14/2018   NA 140 02/14/2018   K 4.9 02/14/2018   CL 103 02/14/2018   CREATININE 1.41 (H) 02/14/2018   BUN 18 02/14/2018   CO2 31 02/14/2018   TSH 1.02 02/14/2018   HGBA1C 5.9 02/14/2018    US Renal  Result Date: 12/27/2017 CLINICAL DATA:  Initial evaluation for chronic kidney disease stage 3. EXAM: RENAL / URINARY TRACT ULTRASOUND COMPLETE COMPARISON:  Prior ultrasound from 03/13/2014. FINDINGS: Right Kidney: Length: 8.3 cm. Increased echogenicity within the renal parenchyma with cortical thinning. No mass or hydronephrosis visualized. Left Kidney: Length: 8.4 cm. Increased echogenicity within the renal parenchyma with cortical thinning. No hydronephrosis. 1.2 x 0.7 x 1.1 cm cyst present at the lower pole. Additional 1.2 x 1.1 x 1.2 cm anechoic cystic lesion present within the adjacent lower pole. No significant internal complexity. Bladder: Appears normal for degree of bladder distention. IMPRESSION: 1. Increased echogenicity within the renal parenchyma with associated cortical thinning, compatible with chronic medical renal disease. 2. No hydronephrosis. 3. Small left renal cysts as above. Electronically Signed   By: Jeannine Boga M.D.   On: 12/27/2017 23:34       Assessment & Plan:   Problem List  Items Addressed This Visit    Anemia    Followed by hematology.  Last evaluated 11/2017.  Stable.  Recommended f/u in 6 months.          Barrett's esophagus    Followed by GI.  Upper symptoms controlled.        CKD (chronic kidney disease) stage 3, GFR 30-59 ml/min (HCC)    Has seen nephrology.  Renal ultrasound unremarkable.  Planning f/u with urology.  Avoid antiinflammatories.  Follow metabolic panel.        COPD with hypoxia ([place])    Was seen pulmonary.  Has been released.  Not requiring oxygen.        Essential hypertension    Blood pressure under good control.  Continue same medication regimen.  Follow pressures.  Follow metabolic panel.        Relevant Orders   Basic metabolic panel   GERD (gastroesophageal reflux disease)    Controlled on current regimen.  Follow.        Hemorrhoids    S/p rubber band ligation.  Still having some issues.  Will notify me when ready for referral back to surgery.  Wants to hold for now.        Hypercholesterolemia    On crestor.  Low cholesterol diet and exercise.  Follow lipid panel and liver function tests.        Relevant Orders   Lipid panel   Hepatic function panel   Hyperglycemia    Low carb diet and exercise.  Follow met b and a1c.        Relevant Orders   Hemoglobin A1c       [name], MD

## 2018-03-18 ENCOUNTER — Encounter: Payer: Self-pay | Admitting: Internal Medicine

## 2018-03-18 NOTE — Assessment & Plan Note (Signed)
On crestor.  Low cholesterol diet and exercise.  Follow lipid panel and liver function tests.   

## 2018-03-18 NOTE — Assessment & Plan Note (Signed)
Low carb diet and exercise.  Follow met b and a1c.   

## 2018-03-18 NOTE — Assessment & Plan Note (Signed)
S/p rubber band ligation.  Still having some issues.  Will notify me when ready for referral back to surgery.  Wants to hold for now.

## 2018-03-18 NOTE — Assessment & Plan Note (Signed)
Blood pressure under good control.  Continue same medication regimen.  Follow pressures.  Follow metabolic panel.   

## 2018-03-18 NOTE — Assessment & Plan Note (Signed)
Was seen pulmonary.  Has been released.  Not requiring oxygen.

## 2018-03-18 NOTE — Assessment & Plan Note (Signed)
Has seen nephrology.  Renal ultrasound unremarkable.  Planning f/u with urology.  Avoid antiinflammatories.  Follow metabolic panel.

## 2018-03-18 NOTE — Assessment & Plan Note (Signed)
Followed by GI.  Upper symptoms controlled.

## 2018-03-18 NOTE — Assessment & Plan Note (Signed)
Controlled on current regimen.  Follow.  

## 2018-03-18 NOTE — Assessment & Plan Note (Signed)
Followed by hematology.  Last evaluated 11/2017.  Stable.  Recommended f/u in 6 months.

## 2018-03-28 ENCOUNTER — Encounter: Payer: Self-pay | Admitting: Urology

## 2018-03-28 ENCOUNTER — Ambulatory Visit (INDEPENDENT_AMBULATORY_CARE_PROVIDER_SITE_OTHER): Payer: Medicare Other | Admitting: Urology

## 2018-03-28 VITALS — BP 126/71 | HR 91 | Ht 64.0 in | Wt 146.5 lb

## 2018-03-28 DIAGNOSIS — R3129 Other microscopic hematuria: Secondary | ICD-10-CM

## 2018-03-28 LAB — URINALYSIS, COMPLETE
Bilirubin, UA: NEGATIVE
Glucose, UA: NEGATIVE
Ketones, UA: NEGATIVE
Nitrite, UA: NEGATIVE
Specific Gravity, UA: 1.025 (ref 1.005–1.030)
Urobilinogen, Ur: 0.2 mg/dL (ref 0.2–1.0)
pH, UA: 5.5 (ref 5.0–7.5)

## 2018-03-28 LAB — MICROSCOPIC EXAMINATION

## 2018-03-28 NOTE — Progress Notes (Signed)
03/28/2018 11:21 AM   [place] Mar 08, 1933 [id number]  Referring provider: [name], MD [address] [place], [place] [address]  CC: Microscopic hematuria  HPI: I saw Ms. Jones in urology clinic today in consultation for microscopic hematuria from Dr. Nicki Reaper.  She is an 84 year old female with multiple co-morbidities including CKD with eGFR of 38 who was found to have 5-10 RBCs per high-power field on urinalysis.  She also went a renal ultrasound in September 2019 for work-up of her CKD which showed medical renal disease, but no hydronephrosis or solid renal masses.  She denies any gross hematuria or urinary symptoms.  Denies recurrent urinary tract infections.  She has a 40-pack-year smoking history and quit 30 years ago.  She denies any other carcinogenic exposures.  She denies any flank or abdominal pain.  She did undergo a microscopic hematuria work-up with Dr. Jacqlyn Larsen around 15 years ago which was negative.  There are no aggravating or alleviating factors.  Severity is mild.  Duration is since September 2019.   PMH: Past Medical History:  Diagnosis Date  . Allergy   . Asthma   . COPD (chronic obstructive pulmonary disease) ([place])   . GERD (gastroesophageal reflux disease)   . Hypercholesteremia   . Hypertension   . Mitral valve insufficiency   . Pulmonary hypertension (HCC)    Right Ventricular systolic pressure at 60 mm Hg by echo on july of 2011; Right heart cardic catherization revealed pulmonary artery pressusre of 27/10 with a mean of; Ventricular pressure 29/10 with pulmonary capillary wedge at 9  . TB (pulmonary tuberculosis)    treated with INH therapy     Surgical History: Past Surgical History:  Procedure Laterality Date  . CARDIAC CATHETERIZATION  10/2016   [facility]  . CATARACT EXTRACTION Bilateral 2008  . CHOLECYSTECTOMY    . TONSILLECTOMY  1964    Allergies:  Allergies  Allergen Reactions  . Penicillin G Swelling  .  Penicillins Swelling and Other (See Comments)    Has patient had a PCN reaction causing immediate rash, facial/tongue/throat swelling, SOB or lightheadedness with hypotension: Yes Has patient had a PCN reaction causing severe rash involving mucus membranes or skin necrosis: No Has patient had a PCN reaction that required hospitalization No Has patient had a PCN reaction occurring within the last 10 years: No If all of the above answers are "NO", then may proceed with Cephalosporin use.    Family History: Family History  Problem Relation Age of Onset  . Heart disease Mother   . Heart disease Father   . Hypertension Sister   . Heart disease Brother   . Breast cancer Maternal Aunt     Social History:  reports that she quit smoking about 27 years ago. Her smoking use included cigarettes. She has a 40.00 pack-year smoking history. She has never used smokeless tobacco. She reports that she does not drink alcohol or use drugs.  ROS: Please see flowsheet from today's date for complete review of systems.  Physical Exam: BP 126/71 (BP Location: Left Arm, Patient Position: Sitting, Cuff Size: Normal)   Pulse 91   Ht '5\' 4"'$  (1.626 m)   Wt 146 lb 8 oz (66.5 kg)   BMI 25.15 kg/m    Constitutional:  Alert and oriented, No acute distress.  Elderly Cardiovascular: No clubbing, cyanosis, or edema. Respiratory: Normal respiratory effort, no increased work of breathing. GI: Abdomen is soft, nontender, nondistended, no abdominal masses GU: No CVA tenderness Lymph: No  cervical or inguinal lymphadenopathy. Skin: No rashes, bruises or suspicious lesions. Neurologic: Grossly intact, no focal deficits, moving all 4 extremities. Psychiatric: Normal mood and affect.  Laboratory Data: Urinalysis today 11-30 WBCs, 11-30 RBCs, many bacteria, nitrite negative  Pertinent Imaging: I have personally reviewed the renal ultrasound dated 12/27/2017: Increased echogenicity within the renal parenchyma with  cortical thinning associated with chronic medical renal disease, no hydronephrosis, no solid renal masses  Assessment & Plan:   In summary, Ms. Hovater is an 84 year old comorbid female with stage III CKD and newly diagnosed asymptomatic microscopic hematuria.   We discussed common possible etiologies of hematuria including malignancy, urolithiasis, medical renal disease, and idiopathic. Standard workup recommended by the AUA includes imaging with CT urogram to assess the upper tracts, and cystoscopy.  With her relatively low risk, advanced age and comorbidities, and CKD, I recommended not performing a CT urogram in the setting of a relatively normal renal ultrasound.  We discussed the very low, but not zero, risk of missed upper tract pathology, and the patient and her son are in agreement.  We will schedule cystoscopy to complete microscopic hematuria work-up  [place], [place] [address] [place], [place] [address] [phone]

## 2018-04-01 ENCOUNTER — Other Ambulatory Visit: Payer: Self-pay | Admitting: Cardiovascular Disease

## 2018-05-02 ENCOUNTER — Ambulatory Visit (INDEPENDENT_AMBULATORY_CARE_PROVIDER_SITE_OTHER): Payer: Medicare Other | Admitting: Urology

## 2018-05-02 ENCOUNTER — Encounter: Payer: Self-pay | Admitting: Urology

## 2018-05-02 ENCOUNTER — Other Ambulatory Visit: Payer: Self-pay | Admitting: Pulmonary Disease

## 2018-05-02 VITALS — BP 123/79 | HR 111 | Ht 64.0 in | Wt 143.0 lb

## 2018-05-02 DIAGNOSIS — R3129 Other microscopic hematuria: Secondary | ICD-10-CM

## 2018-05-02 LAB — URINALYSIS, COMPLETE
BILIRUBIN UA: NEGATIVE
Glucose, UA: NEGATIVE
Ketones, UA: NEGATIVE
Nitrite, UA: NEGATIVE
PH UA: 5.5 (ref 5.0–7.5)
Specific Gravity, UA: 1.02 (ref 1.005–1.030)
Urobilinogen, Ur: 0.2 mg/dL (ref 0.2–1.0)

## 2018-05-02 LAB — MICROSCOPIC EXAMINATION

## 2018-05-02 NOTE — Progress Notes (Signed)
Cystoscopy Procedure Note:  Indication: Microscopic hematuria  After informed consent and discussion of the procedure and its risks, [place] F Errickson was positioned and prepped in the standard fashion. Cystoscopy was performed with a flexible cystoscope. The urethra, bladder neck and entire bladder was visualized in a standard fashion. The ureteral orifices were visualized in their normal location and orientation. Bladder mucosa was grossly normal throughout.  Imaging: No hydro or renal masses on renal ultrasound 12/2017  Findings: Normal cystoscopy  Assessment and Plan: Follow up PRN  Nickolas Madrid, MD 05/02/2018

## 2018-05-02 NOTE — Progress Notes (Signed)
In and Out Catheterization  Patient is present today for a I & O catheterization. Patient was cleaned and prepped in a sterile fashion with betadine and Lidocaine 2% jelly was instilled into the urethra.  A 14FR cath was inserted no complications were noted , 26ml of urine return was noted, urine was yellow in color. A clean urine sample was collected for UA. Bladder was drained  And catheter was removed with out difficulty.    Preformed by: Fonnie Jarvis, CMA

## 2018-05-06 ENCOUNTER — Other Ambulatory Visit: Payer: Self-pay | Admitting: Internal Medicine

## 2018-05-11 DIAGNOSIS — N183 Chronic kidney disease, stage 3 (moderate): Secondary | ICD-10-CM | POA: Diagnosis not present

## 2018-05-11 DIAGNOSIS — R319 Hematuria, unspecified: Secondary | ICD-10-CM | POA: Diagnosis not present

## 2018-05-11 LAB — CBC AND DIFFERENTIAL
HCT: 32 — AB (ref 36–46)
HEMOGLOBIN: 10.8 — AB (ref 12.0–16.0)
Platelets: 199 (ref 150–399)
WBC: 3.2

## 2018-05-11 LAB — BASIC METABOLIC PANEL
BUN: 21 (ref 4–21)
Creatinine: 1.5 — AB (ref 0.5–1.1)
Glucose: 114
Potassium: 4.5 (ref 3.4–5.3)
Sodium: 142 (ref 137–147)

## 2018-05-24 ENCOUNTER — Encounter: Payer: Self-pay | Admitting: Internal Medicine

## 2018-05-24 NOTE — Telephone Encounter (Signed)
OK to send in a new bottle of crestor?

## 2018-05-24 NOTE — Telephone Encounter (Signed)
Ok to refill crestor.

## 2018-05-25 ENCOUNTER — Other Ambulatory Visit: Payer: Self-pay

## 2018-05-25 MED ORDER — ROSUVASTATIN CALCIUM 10 MG PO TABS: 10.0000 mg | ORAL_TABLET | Freq: Every day | ORAL | 1 refills | Status: DC

## 2018-05-26 NOTE — Progress Notes (Deleted)
[facility]  [phone] [phone]  ID: Anita Ferrell OB: Nov 25, 1932  MR#: [id number]  WNI#:[id number]  Patient Care Team: [name], MD as PCP - General (Internal Medicine)  CHIEF COMPLAINT: Anemia, unspecified.  INTERVAL HISTORY: Patient returns to clinic today for repeat laboratory work and further evaluation.  She continues to feel well and remains asymptomatic. She has no neurologic complaints.  She denies any recent fevers or illnesses.  She has a good appetite and denies weight loss.  She has no chest pain or shortness of breath.  She denies any nausea, vomiting, constipation, or diarrhea.  She has no melena or hematochezia.  She has no urinary complaints.  Patient offers no specific complaints today.  REVIEW OF SYSTEMS:   Review of Systems  Constitutional: Negative.  Negative for fever, malaise/fatigue and weight loss.  Respiratory: Negative.  Negative for cough, hemoptysis and shortness of breath.   Cardiovascular: Negative.  Negative for chest pain and leg swelling.  Gastrointestinal: Negative.  Negative for abdominal pain, blood in stool and melena.  Genitourinary: Negative.  Negative for hematuria.  Musculoskeletal: Negative.   Skin: Negative.  Negative for rash.  Neurological: Negative.  Negative for sensory change, focal weakness and weakness.  Psychiatric/Behavioral: Negative.  The patient is not nervous/anxious.     As per HPI. Otherwise, a complete review of systems is negative.  PAST MEDICAL HISTORY: Past Medical History:  Diagnosis Date  . Allergy   . Asthma   . COPD (chronic obstructive pulmonary disease) ([place])   . GERD (gastroesophageal reflux disease)   . Hypercholesteremia   . Hypertension   . Mitral valve insufficiency   . Pulmonary hypertension (HCC)    Right Ventricular systolic pressure at 60 mm Hg by echo on july of 2011; Right heart cardic catherization revealed pulmonary artery pressusre of 27/10  with a mean of; Ventricular pressure 29/10 with pulmonary capillary wedge at 9  . TB (pulmonary tuberculosis)    treated with INH therapy     PAST SURGICAL HISTORY: Past Surgical History:  Procedure Laterality Date  . CARDIAC CATHETERIZATION  10/2016   [facility]  . CATARACT EXTRACTION Bilateral 2008  . CHOLECYSTECTOMY    . TONSILLECTOMY  1964    FAMILY HISTORY: Family History  Problem Relation Age of Onset  . Heart disease Mother   . Heart disease Father   . Hypertension Sister   . Heart disease Brother   . Breast cancer Maternal Aunt     ADVANCED DIRECTIVES (Y/N):  N  HEALTH MAINTENANCE: Social History   Tobacco Use  . Smoking status: Former Smoker    Packs/day: 1.00    Years: 40.00    Pack years: 40.00    Types: Cigarettes    Last attempt to quit: 08/17/1990    Years since quitting: 27.7  . Smokeless tobacco: Never Used  . Tobacco comment: Quit in 1992  Substance Use Topics  . Alcohol use: No    Alcohol/week: 0.0 standard drinks  . Drug use: No     Colonoscopy:  PAP:  Bone density:  Lipid panel:  Allergies  Allergen Reactions  . Penicillin G Swelling  . Penicillins Swelling and Other (See Comments)    Has patient had a PCN reaction causing immediate rash, facial/tongue/throat swelling, SOB or lightheadedness with hypotension: Yes Has patient had a PCN reaction causing severe rash involving mucus membranes or skin necrosis: No Has patient had a PCN reaction that required hospitalization No Has patient had a PCN reaction  occurring within the last 10 years: No If all of the above answers are "NO", then may proceed with Cephalosporin use.    Current Outpatient Medications  Medication Sig Dispense Refill  . acetaminophen (TYLENOL) 500 MG tablet Take 500 mg by mouth as needed.     Marland Kitchen albuterol (VENTOLIN HFA) 108 (90 Base) MCG/ACT inhaler INHALE TWO PUFFS EVERY FOUR HOURS AS NEEDED. FOR WHEEZE/SHORTNESS OF BREATH 18 Inhaler 2  . aspirin EC 81 MG tablet Take 81 mg  by mouth daily.     Marland Kitchen azelastine (ASTELIN) 0.1 % nasal spray USE 1 SPRAY EACH NOSTRIL TWO TIMES DAILY 30 mL 3  . Calcium Carbonate-Vitamin D (CALCIUM 600+D) 600-400 MG-UNIT tablet Take 1 tablet by mouth at bedtime.    Marland Kitchen ENTRESTO 24-26 MG TAKE 1 TABLET BY MOUTH TWICE A DAY 60 tablet 3  . ferrous sulfate 325 (65 FE) MG tablet Take 325 mg by mouth 2 (two) times daily with a meal.     . fexofenadine (ALLEGRA) 180 MG tablet Take 180 mg by mouth daily.     . furosemide (LASIX) 40 MG tablet TAKE 1 TABLET BY MOUTH EVERY OTHER DAY 30 tablet 11  . lansoprazole (PREVACID) 30 MG capsule Take 30 mg by mouth daily.     . meclizine (ANTIVERT) 25 MG tablet Take 25 mg by mouth as needed for dizziness.    . metoprolol tartrate (LOPRESSOR) 50 MG tablet TAKE 1/2 TABLET TWICE A DAY 90 tablet 2  . Multiple Vitamins-Minerals (CENTRUM SILVER PO) Take by mouth.    . Probiotic Product (ALIGN PO) Take by mouth.    . rosuvastatin (CRESTOR) 10 MG tablet Take 1 tablet (10 mg total) by mouth daily. 90 tablet 1  . spironolactone (ALDACTONE) 25 MG tablet TAKE 1/2 TABLET BY MOUTH EVERY DAY 30 tablet 11  . umeclidinium-vilanterol (ANORO ELLIPTA) 62.5-25 MCG/INH AEPB TAKE 1 PUFF BY MOUTH INTO THE LUNGS DAILY 60 each 6   No current facility-administered medications for this visit.     OBJECTIVE: There were no vitals filed for this visit.   There is no height or weight on file to calculate BMI.    ECOG FS:0 - Asymptomatic  General: Well-developed, well-nourished, no acute distress. Eyes: Pink conjunctiva, anicteric sclera. HEENT: Normocephalic, moist mucous membranes. Lungs: Clear to auscultation bilaterally. Heart: Regular rate and rhythm. No rubs, murmurs, or gallops. Abdomen: Soft, nontender, nondistended. No organomegaly noted, normoactive bowel sounds. Musculoskeletal: No edema, cyanosis, or clubbing. Neuro: Alert, answering all questions appropriately. Cranial nerves grossly intact. Skin: No rashes or petechiae  noted. Psych: Normal affect.  LAB RESULTS:  Lab Results  Component Value Date   NA 140 02/14/2018   K 4.9 02/14/2018   CL 103 02/14/2018   CO2 31 02/14/2018   GLUCOSE 120 (H) 02/14/2018   BUN 18 02/14/2018   CREATININE 1.41 (H) 02/14/2018   CALCIUM 10.1 02/14/2018   PROT 6.6 02/14/2018   ALBUMIN 4.2 02/14/2018   AST 21 02/14/2018   ALT 15 02/14/2018   ALKPHOS 56 02/14/2018   BILITOT 0.5 02/14/2018   GFRNONAA 50 (L) 09/08/2015   GFRAA 58 (L) 09/08/2015    Lab Results  Component Value Date   WBC 3.4 (L) 11/24/2017   NEUTROABS 2.2 11/24/2017   HGB 10.9 (L) 11/24/2017   HCT 33.4 (L) 11/24/2017   MCV 85.0 11/24/2017   PLT 161 11/24/2017   Lab Results  Component Value Date   IRON 51 08/16/2017   TIBC 250 08/16/2017   IRONPCTSAT  20 08/16/2017   Lab Results  Component Value Date   FERRITIN 149 08/16/2017     STUDIES: No results found.  ASSESSMENT: Anemia, unspecified.  PLAN:    1. Anemia, unspecified: Patient's hemoglobin remains decreased, but essentially unchanged at 10.9.  All of her other laboratory work including iron stores, B12, folate, SPEP, erythropoietin, and hemolysis labs are all either negative or within normal limits.  She last had a colonoscopy and EGD in January 2016.  She has a mild renal insufficiency that may be contributing.  Also, given her mild leukopenia she may have a component of MDS which would require bone marrow biopsy to diagnose.  This is not necessary at this point.  No intervention is needed.  Return to clinic in 6 months with repeat laboratory work and further evaluation. 2.  Leukopenia: Mild at 3.4.  Monitor.  3.  Renal insufficiency: Patient's baseline creatinine appears to be about 1.4.  Monitor.    I spent a total of 20 minutes face-to-face with the patient of which greater than 50% of the visit was spent in counseling and coordination of care as detailed above.   Patient expressed understanding and was in agreement with this  plan. She also understands that She can call clinic at any time with any questions, concerns, or complaints.    Lloyd Huger, MD   05/26/2018 12:50 PM

## 2018-05-27 ENCOUNTER — Emergency Department: Payer: Medicare Other

## 2018-05-27 ENCOUNTER — Other Ambulatory Visit: Payer: Self-pay

## 2018-05-27 ENCOUNTER — Encounter: Payer: Self-pay | Admitting: Emergency Medicine

## 2018-05-27 ENCOUNTER — Emergency Department
Admission: EM | Admit: 2018-05-27 | Discharge: 2018-05-27 | Disposition: A | Discharge status: Home / Self Care | Payer: Medicare Other | Attending: Emergency Medicine | Admitting: Emergency Medicine

## 2018-05-27 DIAGNOSIS — N183 Chronic kidney disease, stage 3 (moderate): Secondary | ICD-10-CM | POA: Diagnosis not present

## 2018-05-27 DIAGNOSIS — R0600 Dyspnea, unspecified: Secondary | ICD-10-CM | POA: Diagnosis present

## 2018-05-27 DIAGNOSIS — I959 Hypotension, unspecified: Secondary | ICD-10-CM | POA: Diagnosis not present

## 2018-05-27 DIAGNOSIS — R0902 Hypoxemia: Secondary | ICD-10-CM | POA: Diagnosis not present

## 2018-05-27 DIAGNOSIS — I509 Heart failure, unspecified: Secondary | ICD-10-CM | POA: Insufficient documentation

## 2018-05-27 DIAGNOSIS — R0689 Other abnormalities of breathing: Secondary | ICD-10-CM | POA: Diagnosis not present

## 2018-05-27 DIAGNOSIS — I110 Hypertensive heart disease with heart failure: Secondary | ICD-10-CM | POA: Diagnosis not present

## 2018-05-27 DIAGNOSIS — R05 Cough: Secondary | ICD-10-CM | POA: Diagnosis not present

## 2018-05-27 DIAGNOSIS — J441 Chronic obstructive pulmonary disease with (acute) exacerbation: Secondary | ICD-10-CM | POA: Diagnosis not present

## 2018-05-27 DIAGNOSIS — I272 Pulmonary hypertension, unspecified: Secondary | ICD-10-CM | POA: Diagnosis not present

## 2018-05-27 DIAGNOSIS — R001 Bradycardia, unspecified: Secondary | ICD-10-CM | POA: Diagnosis not present

## 2018-05-27 DIAGNOSIS — R0602 Shortness of breath: Secondary | ICD-10-CM | POA: Diagnosis not present

## 2018-05-27 DIAGNOSIS — R069 Unspecified abnormalities of breathing: Secondary | ICD-10-CM | POA: Diagnosis not present

## 2018-05-27 DIAGNOSIS — R000 Tachycardia, unspecified: Secondary | ICD-10-CM | POA: Diagnosis not present

## 2018-05-27 HISTORY — DX: Heart failure, unspecified: I50.9

## 2018-05-27 HISTORY — DX: Unspecified atrial fibrillation: I48.91

## 2018-05-27 LAB — BASIC METABOLIC PANEL
Anion gap: 9 (ref 5–15)
BUN: 22 mg/dL (ref 8–23)
CO2: 29 mmol/L (ref 22–32)
Calcium: 9.5 mg/dL (ref 8.9–10.3)
Chloride: 103 mmol/L (ref 98–111)
Creatinine, Ser: 1.5 mg/dL — ABNORMAL HIGH (ref 0.44–1.00)
GFR calc Af Amer: 37 mL/min — ABNORMAL LOW (ref >60)
GFR calc non Af Amer: 32 mL/min — ABNORMAL LOW (ref >60)
Glucose, Bld: 116 mg/dL — ABNORMAL HIGH (ref 70–99)
POTASSIUM: 3.5 mmol/L (ref 3.5–5.1)
Sodium: 141 mmol/L (ref 135–145)

## 2018-05-27 LAB — CBC WITH DIFFERENTIAL/PLATELET
Abs Immature Granulocytes: 0.01 10*3/uL (ref 0.00–0.07)
Basophils Absolute: 0 10*3/uL (ref 0.0–0.1)
Basophils Relative: 0 %
Eosinophils Absolute: 0.1 10*3/uL (ref 0.0–0.5)
Eosinophils Relative: 4 %
HCT: 33.6 % — ABNORMAL LOW (ref 36.0–46.0)
Hemoglobin: 10.6 g/dL — ABNORMAL LOW (ref 12.0–15.0)
Immature Granulocytes: 0 %
Lymphocytes Relative: 38 %
Lymphs Abs: 1.1 10*3/uL (ref 0.7–4.0)
MCH: 27.1 pg (ref 26.0–34.0)
MCHC: 31.5 g/dL (ref 30.0–36.0)
MCV: 85.9 fL (ref 80.0–100.0)
Monocytes Absolute: 0.3 10*3/uL (ref 0.1–1.0)
Monocytes Relative: 10 %
Neutro Abs: 1.3 10*3/uL — ABNORMAL LOW (ref 1.7–7.7)
Neutrophils Relative %: 48 %
Platelets: 152 10*3/uL (ref 150–400)
RBC: 3.91 MIL/uL (ref 3.87–5.11)
RDW: 12.8 % (ref 11.5–15.5)
WBC: 2.8 10*3/uL — ABNORMAL LOW (ref 4.0–10.5)
nRBC: 0 % (ref 0.0–0.2)

## 2018-05-27 LAB — BRAIN NATRIURETIC PEPTIDE: B Natriuretic Peptide: 63 pg/mL (ref 0.0–100.0)

## 2018-05-27 LAB — INFLUENZA PANEL BY PCR (TYPE A & B)
INFLBPCR: NEGATIVE
Influenza A By PCR: NEGATIVE

## 2018-05-27 LAB — TROPONIN I: Troponin I: <0.03 ng/mL (ref <0.03)

## 2018-05-27 MED ORDER — IPRATROPIUM-ALBUTEROL 0.5-2.5 (3) MG/3ML IN SOLN
3.0000 mL | Freq: Once | RESPIRATORY_TRACT | Status: AC
  Administered 2018-05-27: 3 mL via RESPIRATORY_TRACT
  Filled 2018-05-27: qty 3

## 2018-05-27 MED ORDER — COMPRESSOR/NEBULIZER MISC: 1.0000 | Freq: Once | 0 refills | Status: AC

## 2018-05-27 MED ORDER — AZITHROMYCIN 500 MG PO TABS: 500.0000 mg | ORAL_TABLET | Freq: Every day | ORAL | 0 refills | Status: AC

## 2018-05-27 MED ORDER — AZITHROMYCIN 500 MG PO TABS
500.0000 mg | ORAL_TABLET | Freq: Once | ORAL | Status: AC
  Administered 2018-05-27: 500 mg via ORAL
  Filled 2018-05-27: qty 1

## 2018-05-27 MED ORDER — PREDNISONE 20 MG PO TABS: 60.0000 mg | ORAL_TABLET | Freq: Every day | ORAL | 0 refills | Status: AC

## 2018-05-27 MED ORDER — METHYLPREDNISOLONE SODIUM SUCC 125 MG IJ SOLR
125.0000 mg | Freq: Once | INTRAMUSCULAR | Status: AC
  Administered 2018-05-27: 125 mg via INTRAVENOUS
  Filled 2018-05-27: qty 2

## 2018-05-27 MED ORDER — BENZONATATE 100 MG PO CAPS
100.0000 mg | ORAL_CAPSULE | Freq: Once | ORAL | Status: AC
  Administered 2018-05-27: 100 mg via ORAL
  Filled 2018-05-27: qty 1

## 2018-05-27 MED ORDER — ALBUTEROL SULFATE (2.5 MG/3ML) 0.083% IN NEBU: 2.5000 mg | INHALATION_SOLUTION | Freq: Four times a day (QID) | RESPIRATORY_TRACT | 12 refills | Status: DC | PRN

## 2018-05-27 NOTE — ED Provider Notes (Signed)
[facility] Emergency Department Provider Note    First MD Initiated Contact with Patient 05/27/18 [phone]     (approximate)  I have reviewed the triage vital signs and the nursing notes.   HISTORY  Chief Complaint Shortness of Breath   HPI [name] is a 85 y.o. female with below list of chronic medical conditions including CHF COPD pulmonary hypertension presents to the emergency department via EMS with 2-day history of dyspnea and worsening of her chronic cough with clear white sputum.  Patient denies any fever afebrile on presentation.  Patient denies any chest pain.  Patient denies any lower extremity pain or swelling.  Of note EMS administered 1 albuterol treatment in route with the patient stating that symptoms  improved at this time.   Past Medical History:  Diagnosis Date  . A-fib ([place])   . Allergy   . Asthma   . CHF (congestive heart failure) ([place])   . COPD (chronic obstructive pulmonary disease) ([place])   . GERD (gastroesophageal reflux disease)   . Hypercholesteremia   . Hypertension   . Mitral valve insufficiency   . Pulmonary hypertension (HCC)    Right Ventricular systolic pressure at 60 mm Hg by echo on july of 2011; Right heart cardic catherization revealed pulmonary artery pressusre of 27/10 with a mean of; Ventricular pressure 29/10 with pulmonary capillary wedge at 9  . TB (pulmonary tuberculosis)    treated with INH therapy     Patient Active Problem List   Diagnosis Date Noted  . Acute respiratory failure with hypoxemia ([place]) 06/02/2017  . Pulmonary HTN ([place]) 12/26/2016  . Moderate mitral regurgitation 10/27/2016  . Bradycardia 09/23/2016  . Ventricular bigeminy 09/23/2016  . Diarrhea 01/13/2016  . CKD (chronic kidney disease) stage 3, GFR 30-59 ml/min (HCC) 10/16/2015  . Shortness of breath 03/11/2015  . COPD with hypoxia ([place]) 03/11/2015  . Environmental allergies 01/07/2015  . Health care maintenance 05/29/2014  .  Nephrolithiasis 03/13/2014  . Obesity 03/10/2014  . Splenomegaly 03/05/2014  . Rectal bleeding 03/05/2014  . Hyperglycemia 12/30/2012  . Hemorrhoids 12/30/2012  . Grade I internal hemorrhoids 12/30/2012  . Anemia 08/27/2012  . Hypercholesterolemia 08/25/2012  . Essential hypertension 05/28/2012  . Asthma, chronic 05/28/2012  . GERD (gastroesophageal reflux disease) 05/28/2012  . Barrett's esophagus 05/28/2012    Past Surgical History:  Procedure Laterality Date  . CARDIAC CATHETERIZATION  10/2016   [facility]  . CATARACT EXTRACTION Bilateral 2008  . CHOLECYSTECTOMY    . TONSILLECTOMY  1964    Prior to Admission medications   Medication Sig Start Date End Date Taking? Authorizing Provider  acetaminophen (TYLENOL) 500 MG tablet Take 500 mg by mouth as needed.     [provider]  albuterol (VENTOLIN HFA) 108 (90 Base) MCG/ACT inhaler INHALE TWO PUFFS EVERY FOUR HOURS AS NEEDED. FOR WHEEZE/SHORTNESS OF BREATH 05/02/18   Wilhelmina Mcardle, MD  aspirin EC 81 MG tablet Take 81 mg by mouth daily.     [provider]  azelastine (ASTELIN) 0.1 % nasal spray USE 1 SPRAY EACH NOSTRIL TWO TIMES DAILY 09/07/17   Wilhelmina Mcardle, MD  Calcium Carbonate-Vitamin D (CALCIUM 600+D) 600-400 MG-UNIT tablet Take 1 tablet by mouth at bedtime.    [provider]  ENTRESTO 24-26 MG TAKE 1 TABLET BY MOUTH TWICE A DAY 04/03/18   Minna Merritts, MD  ferrous sulfate 325 (65 FE) MG tablet Take 325 mg by mouth 2 (two) times daily with a  meal.     [provider]  fexofenadine (ALLEGRA) 180 MG tablet Take 180 mg by mouth daily.     [provider]  furosemide (LASIX) 40 MG tablet TAKE 1 TABLET BY MOUTH EVERY OTHER DAY 01/04/18   Minna Merritts, MD  lansoprazole (PREVACID) 30 MG capsule Take 30 mg by mouth daily.     [provider]  meclizine (ANTIVERT) 25 MG tablet Take 25 mg by mouth as needed for dizziness.    [provider]  metoprolol tartrate  (LOPRESSOR) 50 MG tablet TAKE 1/2 TABLET TWICE A DAY 01/31/18   Minna Merritts, MD  Multiple Vitamins-Minerals (CENTRUM SILVER PO) Take by mouth.    [provider]  Probiotic Product (ALIGN PO) Take by mouth.    [provider]  rosuvastatin (CRESTOR) 10 MG tablet Take 1 tablet (10 mg total) by mouth daily. 05/25/18   [name], MD  spironolactone (ALDACTONE) 25 MG tablet TAKE 1/2 TABLET BY MOUTH EVERY DAY 01/04/18   Minna Merritts, MD  umeclidinium-vilanterol [facility] ELLIPTA) 62.5-25 MCG/INH AEPB TAKE 1 PUFF BY MOUTH INTO THE LUNGS DAILY 11/15/17   Wilhelmina Mcardle, MD    Allergies Penicillin g and Penicillins  Family History  Problem Relation Age of Onset  . Heart disease Mother   . Heart disease Father   . Hypertension Sister   . Heart disease Brother   . Breast cancer Maternal Aunt     Social History Social History   Tobacco Use  . Smoking status: Former Smoker    Packs/day: 1.00    Years: 40.00    Pack years: 40.00    Types: Cigarettes    Last attempt to quit: 08/17/1990    Years since quitting: 27.7  . Smokeless tobacco: Never Used  . Tobacco comment: Quit in 1992  Substance Use Topics  . Alcohol use: No    Alcohol/week: 0.0 standard drinks  . Drug use: No    Review of Systems Constitutional: No fever/chills Eyes: No visual changes. ENT: No sore throat. Cardiovascular: Denies chest pain. Respiratory: Denies shortness of breath. Gastrointestinal: No abdominal pain.  No nausea, no vomiting.  No diarrhea.  No constipation. Genitourinary: Negative for dysuria. Musculoskeletal: Negative for neck pain.  Negative for back pain. Integumentary: Negative for rash. Neurological: Negative for headaches, focal weakness or numbness.   ____________________________________________   PHYSICAL EXAM:  VITAL SIGNS: ED Triage Vitals  Enc Vitals Group     BP 05/27/18 0359 124/67     Pulse Rate 05/27/18 0359 94     Resp 05/27/18 0359 (!) 23      Temp 05/27/18 0359 (!) 97.4 F (36.3 C)     Temp Source 05/27/18 0359 Oral     SpO2 05/27/18 0352 92 %     Weight 05/27/18 0402 64.9 kg (143 lb)     Height 05/27/18 0402 1.626 m (5\' 4" )     Head Circumference --      Peak Flow --      Pain Score 05/27/18 0402 0     Pain Loc --      Pain Edu? --      Excl. in [place]? --     Constitutional: Alert and oriented. Well appearing and in no acute distress. Eyes: Conjunctivae are normal.  Mouth/Throat: Mucous membranes are moist.  Oropharynx non-erythematous. Neck: No stridor.   Cardiovascular: Normal rate, regular rhythm. Good peripheral circulation. Grossly normal heart sounds. Respiratory: Normal respiratory effort.  No retractions.  Mild expiratory wheezing  gastrointestinal: Soft and nontender. No distention.  Musculoskeletal: No lower extremity tenderness nor edema. No gross deformities of extremities. Neurologic:  Normal speech and language. No gross focal neurologic deficits are appreciated.  Skin:  Skin is warm, dry and intact. No rash noted.   ____________________________________________   LABS (all labs ordered are listed, but only abnormal results are displayed)  Labs Reviewed  CBC WITH DIFFERENTIAL/PLATELET - Abnormal; Notable for the following components:      Result Value   WBC 2.8 (*)    Hemoglobin 10.6 (*)    HCT 33.6 (*)    Neutro Abs 1.3 (*)    All other components within normal limits  BASIC METABOLIC PANEL - Abnormal; Notable for the following components:   Glucose, Bld 116 (*)    Creatinine, Ser 1.50 (*)    GFR calc non Af Amer 32 (*)    GFR calc Af Amer 37 (*)    All other components within normal limits  BRAIN NATRIURETIC PEPTIDE  TROPONIN I  INFLUENZA PANEL BY PCR (TYPE A & B)   ____________________________________________  EKG  ED ECG REPORT I, [place] N BROWN, the attending physician, personally viewed and interpreted this ECG.   Date: 05/27/2018  EKG Time: 3:57 AM  Rate:102  Rhythm: Sinus  tachycardia  Axis: Normal  Intervals: Normal  ST&T Change: None  ____________________________________________  RADIOLOGY I, [place] N BROWN, personally viewed and evaluated these images (plain radiographs) as part of my medical decision making, as well as reviewing the written report by the radiologist.  ED MD interpretation: Facet but is changes in pulmonary scarring but no acute overlying pulmonary process per radiologist on chest x-ray potation.  Official radiology report(s): Dg Chest 2 View  Result Date: 05/27/2018 CLINICAL DATA:  Shortness of breath and productive cough for 2 days. EXAM: CHEST - 2 VIEW COMPARISON:  09/08/2015 FINDINGS: The cardiac silhouette, mediastinal and hilar contours are within normal limits and stable. There is tortuosity and calcification of the thoracic aorta. Stable chronic emphysematous changes and areas of pulmonary scarring. Stable small calcified granulomas. No definite infiltrates or effusions. The bony thorax is intact. IMPRESSION: Emphysematous changes and pulmonary scarring but no definite acute overlying pulmonary process. Electronically Signed   By: Marijo Sanes M.D.   On: 05/27/2018 05:37      Procedures   ____________________________________________   INITIAL IMPRESSION / ASSESSMENT AND PLAN / ED COURSE  As part of my medical decision making, I reviewed the following data within the electronic MEDICAL RECORD NUMBER 85 year old female presented with above-stated history and physical exam.  Patient given additional DuoNeb's in the emergency department as well as Solu-Medrol 125 mg on reevaluation patient states that she feels much better".  Considered possibility of pneumonia COPD exacerbation CHF and as such appropriate laboratory data and x-ray was performed.  X-ray revealed no evidence of pneumonia or pulmonary edema.  In addition BNP 63.  No lower extremity pain or swelling, no chest pain suspect PE to be unlikely.  Spoke with the patient and  her son at length regarding warning signs that would warrant immediate return to the emergency department.  Patient be prescribed home with albuterol azithromycin and prednisone. ____________________________________________  FINAL CLINICAL IMPRESSION(S) / ED DIAGNOSES  Final diagnoses:  COPD exacerbation ([place])     MEDICATIONS GIVEN DURING THIS VISIT:  Medications  ipratropium-albuterol (DUONEB) 0.5-2.5 (3) MG/3ML nebulizer solution 3 mL (3 mLs Nebulization Given 05/27/18 0437)  ipratropium-albuterol (DUONEB) 0.5-2.5 (3) MG/3ML nebulizer solution 3  mL (3 mLs Nebulization Given 05/27/18 0437)  benzonatate (TESSALON) capsule 100 mg (100 mg Oral Given 05/27/18 0436)  azithromycin (ZITHROMAX) tablet 500 mg (500 mg Oral Given 05/27/18 0436)  methylPREDNISolone sodium succinate (SOLU-MEDROL) 125 mg/2 mL injection 125 mg (125 mg Intravenous Given 05/27/18 0436)     ED Discharge Orders    None       Note:  This document was prepared using Dragon voice recognition software and may include unintentional dictation errors.   Gregor Hams, MD 05/27/18 [phone]

## 2018-05-27 NOTE — ED Triage Notes (Signed)
Patient presents to Emergency Department via [facility] from HOME with complaints of SOB for the last 2 day with productive cough    History of CHF, COPD, AFIB  EMS gave pt 1 x albuterol

## 2018-05-29 ENCOUNTER — Encounter: Payer: Self-pay | Admitting: Internal Medicine

## 2018-05-29 NOTE — Telephone Encounter (Signed)
I can see her 06/06/18 at 12:00.  Tell her to let us know if she needs to be seen earlier.

## 2018-05-29 NOTE — Telephone Encounter (Signed)
Are you ok with waiting to see patient since she is doing ok?

## 2018-05-30 ENCOUNTER — Inpatient Hospital Stay: Payer: Medicare Other

## 2018-05-30 ENCOUNTER — Inpatient Hospital Stay: Payer: Medicare Other | Admitting: Oncology

## 2018-05-30 NOTE — Telephone Encounter (Signed)
Spoke with patient and patients son. She is still having trouble breathing but has improved over the last 3 days. She was seen in the ED on 2/8 for COPD exacerbation. Oxygen levels staying in the low 90s. She is on a zpak, neb treatments q 6 hours prn, and prednisone for 5 days. Advised son to monitor oxygen levels with pulse ox at home. Checked while he was on the phone with me, at rest was 93% and ambulating was 92%. They are using the neb treatments every 6 hours as directed. Advised that if they felt she was improving with no new onset symptoms then we can schedule her for 2/18. If any new symptoms or worsening of SOB, she should be taken back to ED. If symptoms do not continue to improve, We can move appt up.

## 2018-05-30 NOTE — Telephone Encounter (Signed)
Reviewed. D/w nurse.  Pt and pts son feel improving.  Will call or be evaluated if any change or experiences any decline in symptoms or if just feels needs to be seen earlier.

## 2018-06-06 ENCOUNTER — Encounter: Payer: Self-pay | Admitting: Internal Medicine

## 2018-06-06 ENCOUNTER — Ambulatory Visit (INDEPENDENT_AMBULATORY_CARE_PROVIDER_SITE_OTHER): Payer: Medicare Other | Admitting: Internal Medicine

## 2018-06-06 DIAGNOSIS — N183 Chronic kidney disease, stage 3 unspecified: Secondary | ICD-10-CM

## 2018-06-06 DIAGNOSIS — R0902 Hypoxemia: Secondary | ICD-10-CM

## 2018-06-06 DIAGNOSIS — D649 Anemia, unspecified: Secondary | ICD-10-CM

## 2018-06-06 DIAGNOSIS — J449 Chronic obstructive pulmonary disease, unspecified: Secondary | ICD-10-CM | POA: Diagnosis not present

## 2018-06-06 DIAGNOSIS — I272 Pulmonary hypertension, unspecified: Secondary | ICD-10-CM

## 2018-06-06 DIAGNOSIS — E7800 Pure hypercholesterolemia, unspecified: Secondary | ICD-10-CM

## 2018-06-06 DIAGNOSIS — I10 Essential (primary) hypertension: Secondary | ICD-10-CM | POA: Diagnosis not present

## 2018-06-06 DIAGNOSIS — K2270 Barrett's esophagus without dysplasia: Secondary | ICD-10-CM

## 2018-06-06 MED ORDER — PREDNISONE 10 MG PO TABS: ORAL_TABLET | ORAL | 0 refills | Status: DC

## 2018-06-06 MED ORDER — UMECLIDINIUM-VILANTEROL 62.5-25 MCG/INH IN AEPB: INHALATION_SPRAY | RESPIRATORY_TRACT | 6 refills | Status: DC

## 2018-06-06 MED ORDER — AZELASTINE HCL 0.1 % NA SOLN: NASAL | 3 refills | Status: DC

## 2018-06-06 NOTE — Progress Notes (Signed)
Patient ID: Anita Ferrell, female   DOB: 12/20/1932, 85 y.o.   MRN: [id number]   Subjective:    Patient ID: Anita Ferrell, female    DOB: 07/14/1932, 85 y.o.   MRN: [id number]  HPI  Patient here for ER follow up.  Was evaluated 05/27/18 with SOB.  Xray revealed no pneumonia.  Was given nebs and I V solumedrol in ER.  Discharged with albuterol, azithromycin and prednisone.  States she feels better, but still with sob with exertion.  She is eating.  Still with persistent cough.  No chest pain.  No acid reflux.  No abdominal pain.  Bowels moving.     Past Medical History:  Diagnosis Date  . A-fib ([place])   . Allergy   . Asthma   . CHF (congestive heart failure) ([place])   . COPD (chronic obstructive pulmonary disease) ([place])   . GERD (gastroesophageal reflux disease)   . Hypercholesteremia   . Hypertension   . Mitral valve insufficiency   . Pulmonary hypertension (HCC)    Right Ventricular systolic pressure at 60 mm Hg by echo on july of 2011; Right heart cardic catherization revealed pulmonary artery pressusre of 27/10 with a mean of; Ventricular pressure 29/10 with pulmonary capillary wedge at 9  . TB (pulmonary tuberculosis)    treated with INH therapy    Past Surgical History:  Procedure Laterality Date  . CARDIAC CATHETERIZATION  10/2016   [facility]  . CATARACT EXTRACTION Bilateral 2008  . CHOLECYSTECTOMY    . TONSILLECTOMY  1964   Family History  Problem Relation Age of Onset  . Heart disease Mother   . Heart disease Father   . Hypertension Sister   . Heart disease Brother   . Breast cancer Maternal Aunt    Social History   Socioeconomic History  . Marital status: Widowed    Spouse name: Not on file  . Number of children: 3  . Years of education: Not on file  . Highest education level: Not on file  Occupational History  . Not on file  Social Needs  . Financial resource strain: Not hard at all  . Food insecurity:    Worry: Never true    Inability: Never true  .  Transportation needs:    Medical: No    Non-medical: No  Tobacco Use  . Smoking status: Former Smoker    Packs/day: 1.00    Years: 40.00    Pack years: 40.00    Types: Cigarettes    Last attempt to quit: 08/17/1990    Years since quitting: 27.8  . Smokeless tobacco: Never Used  . Tobacco comment: Quit in 1992  Substance and Sexual Activity  . Alcohol use: No    Alcohol/week: 0.0 standard drinks  . Drug use: No  . Sexual activity: Not Currently  Lifestyle  . Physical activity:    Days per week: 0 days    Minutes per session: Not on file  . Stress: Not at all  Relationships  . Social connections:    Talks on phone: Not on file    Gets together: Not on file    Attends religious service: Not on file    Active member of club or organization: Not on file    Attends meetings of clubs or organizations: Not on file    Relationship status: Not on file  Other Topics Concern  . Not on file  Social History Narrative  . Not on file    Outpatient Encounter  Medications as of 06/06/2018  Medication Sig  . acetaminophen (TYLENOL) 500 MG tablet Take 500 mg by mouth as needed.   Marland Kitchen albuterol (PROVENTIL) (2.5 MG/3ML) 0.083% nebulizer solution Take 3 mLs (2.5 mg total) by nebulization every 6 (six) hours as needed for wheezing or shortness of breath.  Marland Kitchen albuterol (VENTOLIN HFA) 108 (90 Base) MCG/ACT inhaler INHALE TWO PUFFS EVERY FOUR HOURS AS NEEDED. FOR WHEEZE/SHORTNESS OF BREATH  . aspirin EC 81 MG tablet Take 81 mg by mouth daily.   Marland Kitchen azelastine (ASTELIN) 0.1 % nasal spray USE 1 SPRAY EACH NOSTRIL TWO TIMES DAILY  . Calcium Carbonate-Vitamin D (CALCIUM 600+D) 600-400 MG-UNIT tablet Take 1 tablet by mouth at bedtime.  Marland Kitchen ENTRESTO 24-26 MG TAKE 1 TABLET BY MOUTH TWICE A DAY  . ferrous sulfate 325 (65 FE) MG tablet Take 325 mg by mouth 2 (two) times daily with a meal.   . fexofenadine (ALLEGRA) 180 MG tablet Take 180 mg by mouth daily.   . furosemide (LASIX) 40 MG tablet TAKE 1 TABLET BY MOUTH  EVERY OTHER DAY  . lansoprazole (PREVACID) 30 MG capsule Take 30 mg by mouth daily.   . meclizine (ANTIVERT) 25 MG tablet Take 25 mg by mouth as needed for dizziness.  . metoprolol tartrate (LOPRESSOR) 50 MG tablet TAKE 1/2 TABLET TWICE A DAY  . Multiple Vitamins-Minerals (CENTRUM SILVER PO) Take by mouth.  . Probiotic Product (ALIGN PO) Take by mouth.  . rosuvastatin (CRESTOR) 10 MG tablet Take 1 tablet (10 mg total) by mouth daily.  Marland Kitchen spironolactone (ALDACTONE) 25 MG tablet TAKE 1/2 TABLET BY MOUTH EVERY DAY  . umeclidinium-vilanterol (ANORO ELLIPTA) 62.5-25 MCG/INH AEPB TAKE 1 PUFF BY MOUTH INTO THE LUNGS DAILY  . [DISCONTINUED] azelastine (ASTELIN) 0.1 % nasal spray USE 1 SPRAY EACH NOSTRIL TWO TIMES DAILY  . [DISCONTINUED] umeclidinium-vilanterol (ANORO ELLIPTA) 62.5-25 MCG/INH AEPB TAKE 1 PUFF BY MOUTH INTO THE LUNGS DAILY  . predniSONE (DELTASONE) 10 MG tablet Take 6 tablets x 1 day and then decrease by 1/2 tablet per day until down to zero mg.   No facility-administered encounter medications on file as of 06/06/2018.     Review of Systems  Constitutional: Negative for appetite change and unexpected weight change.  HENT: Negative for congestion and sinus pressure.   Respiratory: Positive for cough and shortness of breath. Negative for chest tightness.   Cardiovascular: Negative for chest pain, palpitations and leg swelling.  Gastrointestinal: Negative for abdominal pain, diarrhea, nausea and vomiting.  Genitourinary: Negative for difficulty urinating and dysuria.  Musculoskeletal: Negative for joint swelling and myalgias.  Skin: Negative for color change and rash.  Neurological: Negative for dizziness, light-headedness and headaches.  Psychiatric/Behavioral: Negative for agitation and dysphoric mood.       Objective:    Physical Exam Constitutional:      General: She is not in acute distress.    Appearance: Normal appearance.  HENT:     Nose: Nose normal. No congestion.      Mouth/Throat:     Pharynx: No oropharyngeal exudate or posterior oropharyngeal erythema.  Neck:     Musculoskeletal: Neck supple. No muscular tenderness.     Thyroid: No thyromegaly.  Cardiovascular:     Rate and Rhythm: Normal rate and regular rhythm.  Pulmonary:     Comments: Increased cough with forced expiration.  No increased wheezing.   Abdominal:     General: Bowel sounds are normal.     Palpations: Abdomen is soft.  Tenderness: There is no abdominal tenderness.  Musculoskeletal:        General: No swelling or tenderness.  Lymphadenopathy:     Cervical: No cervical adenopathy.  Skin:    Findings: No erythema or rash.  Neurological:     Mental Status: She is alert.  Psychiatric:        Mood and Affect: Mood normal.        Behavior: Behavior normal.     BP 120/70   Pulse (!) 104   Temp 97.8 F (36.6 C) (Oral)   Wt 145 lb (65.8 kg)   SpO2 95%   BMI 24.89 kg/m  Wt Readings from Last 3 Encounters:  06/06/18 145 lb (65.8 kg)  05/27/18 143 lb (64.9 kg)  05/02/18 143 lb (64.9 kg)     Lab Results  Component Value Date   WBC 2.8 (L) 05/27/2018   HGB 10.6 (L) 05/27/2018   HCT 33.6 (L) 05/27/2018   PLT 152 05/27/2018   GLUCOSE 116 (H) 05/27/2018   CHOL 115 02/14/2018   TRIG 115.0 02/14/2018   HDL 47.10 02/14/2018   LDLDIRECT 127.1 08/25/2012   LDLCALC 45 02/14/2018   ALT 15 02/14/2018   AST 21 02/14/2018   NA 141 05/27/2018   K 3.5 05/27/2018   CL 103 05/27/2018   CREATININE 1.50 (H) 05/27/2018   BUN 22 05/27/2018   CO2 29 05/27/2018   TSH 1.02 02/14/2018   HGBA1C 5.9 02/14/2018    Dg Chest 2 View  Result Date: 05/27/2018 CLINICAL DATA:  Shortness of breath and productive cough for 2 days. EXAM: CHEST - 2 VIEW COMPARISON:  09/08/2015 FINDINGS: The cardiac silhouette, mediastinal and hilar contours are within normal limits and stable. There is tortuosity and calcification of the thoracic aorta. Stable chronic emphysematous changes and areas of  pulmonary scarring. Stable small calcified granulomas. No definite infiltrates or effusions. The bony thorax is intact. IMPRESSION: Emphysematous changes and pulmonary scarring but no definite acute overlying pulmonary process. Electronically Signed   By: Marijo Sanes M.D.   On: 05/27/2018 05:37       Assessment & Plan:   Problem List Items Addressed This Visit    Anemia    Has been followed by hematology.        Barrett's esophagus    Upper symptoms controlled.        CKD (chronic kidney disease) stage 3, GFR 30-59 ml/min (HCC)    Has seen nephrology.  Renal ultrasound unremarkable.  Continue avoid antiinflammatories.  Follow metabolic panel.       COPD with hypoxia [facility])    Recent ER evaluation for COPD exacerbation.  Treated with steroids, abx and nebs.  Is breathing better, but still with increased cough and sob.  Completed azithromycin.  Continue nebs.  Treat with prednisone taper as directed.        Relevant Medications   predniSONE (DELTASONE) 10 MG tablet   azelastine (ASTELIN) 0.1 % nasal spray   umeclidinium-vilanterol (ANORO ELLIPTA) 62.5-25 MCG/INH AEPB   Essential hypertension    Blood pressure under good control.  Continue same medication regimen.  Follow pressures.  Follow metabolic panel.        Hypercholesterolemia    On crestor.  Follow lipid panel and liver function tests.       Pulmonary HTN ([place])    Last PCWP documented - 8.  Followed by cardiology and pulmonary.            [name], MD'

## 2018-06-09 ENCOUNTER — Encounter: Payer: Self-pay | Admitting: Internal Medicine

## 2018-06-09 NOTE — Assessment & Plan Note (Signed)
Last PCWP documented - 8.  Followed by cardiology and pulmonary.

## 2018-06-09 NOTE — Assessment & Plan Note (Signed)
Has seen nephrology.  Renal ultrasound unremarkable.  Continue avoid antiinflammatories.  Follow metabolic panel.

## 2018-06-09 NOTE — Assessment & Plan Note (Signed)
Recent ER evaluation for COPD exacerbation.  Treated with steroids, abx and nebs.  Is breathing better, but still with increased cough and sob.  Completed azithromycin.  Continue nebs.  Treat with prednisone taper as directed.

## 2018-06-09 NOTE — Assessment & Plan Note (Signed)
On crestor.  Follow lipid panel and liver function tests.   

## 2018-06-09 NOTE — Assessment & Plan Note (Signed)
Has been followed by hematology.  °

## 2018-06-09 NOTE — Assessment & Plan Note (Signed)
Blood pressure under good control.  Continue same medication regimen.  Follow pressures.  Follow metabolic panel.   

## 2018-06-09 NOTE — Assessment & Plan Note (Signed)
Upper symptoms controlled.  

## 2018-06-21 ENCOUNTER — Encounter: Payer: Self-pay | Admitting: Internal Medicine

## 2018-07-04 ENCOUNTER — Ambulatory Visit: Payer: Medicare Other | Admitting: Cardiovascular Disease

## 2018-07-18 ENCOUNTER — Telehealth: Payer: Self-pay

## 2018-07-18 ENCOUNTER — Encounter: Payer: Self-pay | Admitting: Internal Medicine

## 2018-07-18 NOTE — Telephone Encounter (Signed)
Moved appt per pt request. Scheduled appt for June as well as a fasting lab appt. Pt confirmed doing okay and will call if needed.

## 2018-07-18 NOTE — Telephone Encounter (Signed)
Copied from [place] [id number]. Topic: Appointment Scheduling - Scheduling Inquiry for Clinic >> Jul 18, 2018  8:47 AM Rayann Heman wrote: Reason for CRM: pt called and left a voicemail that she would like to cancel both appointment for April. Pt would like a call back regarding. Please advise

## 2018-07-18 NOTE — Telephone Encounter (Signed)
See phone note

## 2018-07-21 ENCOUNTER — Other Ambulatory Visit: Payer: Medicare Other

## 2018-07-25 ENCOUNTER — Ambulatory Visit: Payer: Medicare Other | Admitting: Internal Medicine

## 2018-08-01 ENCOUNTER — Other Ambulatory Visit: Payer: Self-pay

## 2018-08-01 MED ORDER — SACUBITRIL-VALSARTAN 24-26 MG PO TABS: 1.0000 | ORAL_TABLET | Freq: Two times a day (BID) | ORAL | 3 refills | Status: DC

## 2018-08-03 ENCOUNTER — Telehealth: Payer: Self-pay

## 2018-08-03 NOTE — Telephone Encounter (Signed)
Called patient.  No answer. No VM.  Need to change patients appointment to an Evisit.

## 2018-08-08 ENCOUNTER — Telehealth: Payer: Self-pay

## 2018-08-08 NOTE — Telephone Encounter (Signed)

## 2018-08-20 NOTE — Progress Notes (Signed)
Virtual Visit via Telephone Note   This visit type was conducted due to national recommendations for restrictions regarding the COVID-19 Pandemic (e.g. social distancing) in an effort to limit this patient's exposure and mitigate transmission in our community.  Due to her co-morbid illnesses, this patient is at least at moderate risk for complications without adequate follow up.  This format is felt to be most appropriate for this patient at this time.  The patient did not have access to video technology/had technical difficulties with video requiring transitioning to audio format only (telephone).  All issues noted in this document were discussed and addressed.  No physical exam could be performed with this format.  Please refer to the patient's chart for her  consent to telehealth for [facility].   I connected with  Anita Ferrell on 08/22/18 by a video enabled telemedicine application and verified that I am speaking with the correct person using two identifiers. I discussed the limitations of evaluation and management by telemedicine. The patient expressed understanding and agreed to proceed.   Evaluation Performed:  Follow-up visit  Date:  08/22/2018   ID:  Anita Ferrell, DOB Aug 01, 1932, MRN [id number]  Patient Location:  [place] [address]   Provider location:   [facility], [place] office  PCP:  [name], MD  Cardiologist:  Patsy Baltimore   Chief Complaint:  SOB    History of Present Illness:    Anita Ferrell is a 85 y.o. female who presents via audio/video conferencing for a telehealth visit today.   The patient does not symptoms concerning for COVID-19 infection (fever, chills, cough, or new SHORTNESS OF BREATH).   Patient has a past medical history of Pulmonary hypertension HTN,  COPD, smoker 85 yo quit 56 Moderate Mitral Regurg, moderate tricuspid valve regurgitation  chronic shortness of breath with  exertion.  2015  PFT reports of an FEV1 of 1.1 L.  Nonobstructive coronary disease by catheterization July 2018 Moderately elevated right heart pressures with normal PCWP of 8, by right heart catheterization , on Lasix every other day Echo in 08/2015: EF 35% Mildly reduced LV function, LVEF approximately 45% by TEE at [place] presents for mitral valve regurgitation, shortness of breath, pulm HTN  Lasix every other day Chronic SOB  05/2018 in the ER  2-day history of dyspnea and worsening of her chronic cough with clear white sputum Started on neb treatment BID Hospital records reviewed with the patient in detail  On inhalers and nebs now, feels better  Denies any lower extremity edema, abdominal bloating, PND orthopnea No significant change in her weight  Takes extra lasix for weight gain Goal weight <145  Other past medical history reviewed   Prior CV studies:   The following studies were reviewed today:  Right Ventricular systolic pressure at 60 mm Hg by echo on july of 2011; Right heart cardic catherization revealed pulmonary artery pressusre of 27/10 with a mean of; Ventricular pressure 29/10 with pulmonary capillary wedge at 9  Records from [place] reviewed with her in detail on today's visit Cath 10/2016 1.Cardiac index 2.2 L/m/m2 at rest with PCWP 8 mmHg. 2.Moderate pulmonary hypertension in the absence of elevated PCWP, PVR 6.0 Wu. 3.Non obstructive CAD Plan medical therapy of moderate MR and severe COPD   Previously followed by Dr Aline Brochure [facility].  ECHO - mild to moderate left ventricular dysfunction with moderate to severe MR and moderate TR.   TEE:  1. Moderate Functional MR (EROA by PISA = 0.1) 2. Moderate TR 3. Mild PR 4. No interatrial septal communication 5. Mildly reduced LV function, LVEF approximately 45%  Lab work reviewed with creatinine 1.4   Past Medical History:  Diagnosis Date  . A-fib ([place])   . Allergy   . Asthma   .  CHF (congestive heart failure) ([place])   . COPD (chronic obstructive pulmonary disease) ([place])   . GERD (gastroesophageal reflux disease)   . Hypercholesteremia   . Hypertension   . Mitral valve insufficiency   . Pulmonary hypertension (HCC)    Right Ventricular systolic pressure at 60 mm Hg by echo on july of 2011; Right heart cardic catherization revealed pulmonary artery pressusre of 27/10 with a mean of; Ventricular pressure 29/10 with pulmonary capillary wedge at 9  . TB (pulmonary tuberculosis)    treated with INH therapy    Past Surgical History:  Procedure Laterality Date  . CARDIAC CATHETERIZATION  10/2016   [facility]  . CATARACT EXTRACTION Bilateral 2008  . CHOLECYSTECTOMY    . TONSILLECTOMY  1964     Current Meds  Medication Sig  . acetaminophen (TYLENOL) 500 MG tablet Take 500 mg by mouth as needed.   Marland Kitchen albuterol (PROVENTIL) (2.5 MG/3ML) 0.083% nebulizer solution Take 3 mLs (2.5 mg total) by nebulization every 6 (six) hours as needed for wheezing or shortness of breath.  Marland Kitchen albuterol (VENTOLIN HFA) 108 (90 Base) MCG/ACT inhaler INHALE TWO PUFFS EVERY FOUR HOURS AS NEEDED. FOR WHEEZE/SHORTNESS OF BREATH  . aspirin EC 81 MG tablet Take 81 mg by mouth daily.   Marland Kitchen azelastine (ASTELIN) 0.1 % nasal spray USE 1 SPRAY EACH NOSTRIL TWO TIMES DAILY  . Calcium Carbonate-Vitamin D (CALCIUM 600+D) 600-400 MG-UNIT tablet Take 1 tablet by mouth at bedtime.  . ferrous sulfate 325 (65 FE) MG tablet Take 325 mg by mouth 2 (two) times daily with a meal.   . fexofenadine (ALLEGRA) 180 MG tablet Take 180 mg by mouth daily.   . furosemide (LASIX) 40 MG tablet TAKE 1 TABLET BY MOUTH EVERY OTHER DAY  . lansoprazole (PREVACID) 30 MG capsule Take 30 mg by mouth daily.   . meclizine (ANTIVERT) 25 MG tablet Take 25 mg by mouth as needed for dizziness.  . metoprolol tartrate (LOPRESSOR) 50 MG tablet TAKE 1/2 TABLET TWICE A DAY  . Multiple Vitamins-Minerals (CENTRUM SILVER PO) Take by mouth.  . predniSONE  (DELTASONE) 10 MG tablet Take 6 tablets x 1 day and then decrease by 1/2 tablet per day until down to zero mg.  . Probiotic Product (ALIGN PO) Take by mouth.  . rosuvastatin (CRESTOR) 10 MG tablet Take 1 tablet (10 mg total) by mouth daily.  . sacubitril-valsartan (ENTRESTO) 24-26 MG Take 1 tablet by mouth 2 (two) times daily.  Marland Kitchen spironolactone (ALDACTONE) 25 MG tablet TAKE 1/2 TABLET BY MOUTH EVERY DAY  . umeclidinium-vilanterol (ANORO ELLIPTA) 62.5-25 MCG/INH AEPB TAKE 1 PUFF BY MOUTH INTO THE LUNGS DAILY     Allergies:   Penicillin g and Penicillins   Social History   Tobacco Use  . Smoking status: Former Smoker    Packs/day: 1.00    Years: 40.00    Pack years: 40.00    Types: Cigarettes    Last attempt to quit: 08/17/1990    Years since quitting: 28.0  . Smokeless tobacco: Never Used  . Tobacco comment: Quit in 1992  Substance Use Topics  . Alcohol use: No    Alcohol/week: 0.0  standard drinks  . Drug use: No     Current Outpatient Medications on File Prior to Visit  Medication Sig Dispense Refill  . acetaminophen (TYLENOL) 500 MG tablet Take 500 mg by mouth as needed.     Marland Kitchen albuterol (PROVENTIL) (2.5 MG/3ML) 0.083% nebulizer solution Take 3 mLs (2.5 mg total) by nebulization every 6 (six) hours as needed for wheezing or shortness of breath. 75 mL 12  . albuterol (VENTOLIN HFA) 108 (90 Base) MCG/ACT inhaler INHALE TWO PUFFS EVERY FOUR HOURS AS NEEDED. FOR WHEEZE/SHORTNESS OF BREATH 18 Inhaler 2  . aspirin EC 81 MG tablet Take 81 mg by mouth daily.     Marland Kitchen azelastine (ASTELIN) 0.1 % nasal spray USE 1 SPRAY EACH NOSTRIL TWO TIMES DAILY 30 mL 3  . Calcium Carbonate-Vitamin D (CALCIUM 600+D) 600-400 MG-UNIT tablet Take 1 tablet by mouth at bedtime.    . ferrous sulfate 325 (65 FE) MG tablet Take 325 mg by mouth 2 (two) times daily with a meal.     . fexofenadine (ALLEGRA) 180 MG tablet Take 180 mg by mouth daily.     . furosemide (LASIX) 40 MG tablet TAKE 1 TABLET BY MOUTH EVERY  OTHER DAY 30 tablet 11  . lansoprazole (PREVACID) 30 MG capsule Take 30 mg by mouth daily.     . meclizine (ANTIVERT) 25 MG tablet Take 25 mg by mouth as needed for dizziness.    . metoprolol tartrate (LOPRESSOR) 50 MG tablet TAKE 1/2 TABLET TWICE A DAY 90 tablet 2  . Multiple Vitamins-Minerals (CENTRUM SILVER PO) Take by mouth.    . predniSONE (DELTASONE) 10 MG tablet Take 6 tablets x 1 day and then decrease by 1/2 tablet per day until down to zero mg. 39 tablet 0  . Probiotic Product (ALIGN PO) Take by mouth.    . rosuvastatin (CRESTOR) 10 MG tablet Take 1 tablet (10 mg total) by mouth daily. 90 tablet 1  . sacubitril-valsartan (ENTRESTO) 24-26 MG Take 1 tablet by mouth 2 (two) times daily. 60 tablet 3  . spironolactone (ALDACTONE) 25 MG tablet TAKE 1/2 TABLET BY MOUTH EVERY DAY 30 tablet 11  . umeclidinium-vilanterol (ANORO ELLIPTA) 62.5-25 MCG/INH AEPB TAKE 1 PUFF BY MOUTH INTO THE LUNGS DAILY 60 each 6   No current facility-administered medications on file prior to visit.      Family Hx: The patient's family history includes Breast cancer in her maternal aunt; Heart disease in her brother, father, and mother; Hypertension in her sister.  ROS:   Please see the history of present illness.    Review of Systems  Constitutional: Negative.   Respiratory: Negative.   Cardiovascular: Negative.   Gastrointestinal: Negative.   Musculoskeletal: Negative.   Neurological: Negative.   Psychiatric/Behavioral: Negative.   All other systems reviewed and are negative.    Labs/Other Tests and Data Reviewed:    Recent Labs: 02/14/2018: ALT 15; TSH 1.02 05/27/2018: B Natriuretic Peptide 63.0; BUN 22; Creatinine, Ser 1.50; Hemoglobin 10.6; Platelets 152; Potassium 3.5; Sodium 141   Recent Lipid Panel Lab Results  Component Value Date/Time   CHOL 115 02/14/2018 11:18 AM   TRIG 115.0 02/14/2018 11:18 AM   HDL 47.10 02/14/2018 11:18 AM   CHOLHDL 2 02/14/2018 11:18 AM   LDLCALC 45 02/14/2018  11:18 AM   LDLDIRECT 127.1 08/25/2012 11:03 AM    Wt Readings from Last 3 Encounters:  06/06/18 145 lb (65.8 kg)  05/27/18 143 lb (64.9 kg)  05/02/18 143 lb (64.9 kg)  Exam:    Vital Signs: Vital signs may also be detailed in the HPI There were no vitals taken for this visit.  Wt Readings from Last 3 Encounters:  06/06/18 145 lb (65.8 kg)  05/27/18 143 lb (64.9 kg)  05/02/18 143 lb (64.9 kg)   Temp Readings from Last 3 Encounters:  06/06/18 97.8 F (36.6 C) (Oral)  05/27/18 (!) 97.4 F (36.3 C) (Oral)  03/15/18 (!) 97.5 F (36.4 C) (Oral)   BP Readings from Last 3 Encounters:  06/06/18 120/70  05/27/18 124/67  05/02/18 123/79   Pulse Readings from Last 3 Encounters:  06/06/18 (!) 104  05/27/18 100  05/02/18 (!) 111    Weight, 102/65, pulse 101  Well nourished, well developed female in no acute distress. Constitutional:  oriented to person, place, and time. No distress.    ASSESSMENT & PLAN:    Pulmonary HTN ([place]) Reports breathing is stable, Tolerating medications Takes extra Lasix as needed for weight gain, weight is otherwise stable  COPD with hypoxia (HCC) Uses nebulizer, inhalers, stable  no recent COPD exacerbation  Moderate mitral regurgitation Moderate MR previously seen on echocardiogram Will discuss with her in follow-up with a repeat study as needed Reports that she feels euvolemic, stable, able to do what she wants  CKD (chronic kidney disease) stage 3, GFR 30-59 ml/min (HCC) Creatinine stable 1.5, high end of her range No changes made to her medications  Essential hypertension Low blood pressure on today's visit, recommended she monitor pressure closely and if it continues to run very low that she call our office Typically runs 120s  Hypercholesterolemia Cholesterol is at goal on the current lipid regimen. No changes to the medications were made.   COVID-19 Education: The signs and symptoms of COVID-19 were discussed with the  patient and how to seek care for testing (follow up with PCP or arrange E-visit).  The importance of social distancing was discussed today.  Patient Risk:   After full review of this patients clinical status, I feel that they are at least moderate risk at this time.  Time:   Today, I have spent 25 minutes with the patient with telehealth technology discussing the cardiac and medical problems/diagnoses detailed above   10 min spent reviewing the chart prior to patient visit today   Medication Adjustments/Labs and Tests Ordered: Current medicines are reviewed at length with the patient today.  Concerns regarding medicines are outlined above.   Tests Ordered: No tests ordered   Medication Changes: No changes made   Disposition: Follow-up in 6 months   Signed, Ida Rogue, MD  08/22/2018 10:36 AM    [facility] [address], [place], [place] [address]

## 2018-08-22 ENCOUNTER — Telehealth (INDEPENDENT_AMBULATORY_CARE_PROVIDER_SITE_OTHER): Payer: Medicare Other | Admitting: Cardiovascular Disease

## 2018-08-22 ENCOUNTER — Other Ambulatory Visit: Payer: Self-pay

## 2018-08-22 DIAGNOSIS — R0902 Hypoxemia: Secondary | ICD-10-CM | POA: Diagnosis not present

## 2018-08-22 DIAGNOSIS — E7800 Pure hypercholesterolemia, unspecified: Secondary | ICD-10-CM | POA: Diagnosis not present

## 2018-08-22 DIAGNOSIS — I340 Nonrheumatic mitral (valve) insufficiency: Secondary | ICD-10-CM | POA: Diagnosis not present

## 2018-08-22 DIAGNOSIS — I272 Pulmonary hypertension, unspecified: Secondary | ICD-10-CM | POA: Diagnosis not present

## 2018-08-22 DIAGNOSIS — N183 Chronic kidney disease, stage 3 unspecified: Secondary | ICD-10-CM

## 2018-08-22 DIAGNOSIS — I10 Essential (primary) hypertension: Secondary | ICD-10-CM | POA: Diagnosis not present

## 2018-08-22 DIAGNOSIS — R0602 Shortness of breath: Secondary | ICD-10-CM

## 2018-08-22 DIAGNOSIS — J449 Chronic obstructive pulmonary disease, unspecified: Secondary | ICD-10-CM | POA: Diagnosis not present

## 2018-08-22 NOTE — Patient Instructions (Signed)

## 2018-10-06 ENCOUNTER — Other Ambulatory Visit (INDEPENDENT_AMBULATORY_CARE_PROVIDER_SITE_OTHER): Payer: Medicare Other

## 2018-10-06 ENCOUNTER — Other Ambulatory Visit: Payer: Self-pay

## 2018-10-06 DIAGNOSIS — E7800 Pure hypercholesterolemia, unspecified: Secondary | ICD-10-CM

## 2018-10-06 DIAGNOSIS — I10 Essential (primary) hypertension: Secondary | ICD-10-CM | POA: Diagnosis not present

## 2018-10-06 DIAGNOSIS — R739 Hyperglycemia, unspecified: Secondary | ICD-10-CM

## 2018-10-06 LAB — HEPATIC FUNCTION PANEL
ALT: 13 U/L (ref 0–35)
AST: 19 U/L (ref 0–37)
Albumin: 3.9 g/dL (ref 3.5–5.2)
Alkaline Phosphatase: 58 U/L (ref 39–117)
Bilirubin, Direct: 0.1 mg/dL (ref 0.0–0.3)
Total Bilirubin: 0.5 mg/dL (ref 0.2–1.2)
Total Protein: 5.5 g/dL — ABNORMAL LOW (ref 6.0–8.3)

## 2018-10-06 LAB — BASIC METABOLIC PANEL
BUN: 15 mg/dL (ref 6–23)
CO2: 30 mEq/L (ref 19–32)
Calcium: 9.4 mg/dL (ref 8.4–10.5)
Chloride: 103 mEq/L (ref 96–112)
Creatinine, Ser: 1.43 mg/dL — ABNORMAL HIGH (ref 0.40–1.20)
GFR: 34.92 mL/min — ABNORMAL LOW (ref >60.00)
Glucose, Bld: 104 mg/dL — ABNORMAL HIGH (ref 70–99)
Potassium: 4.3 mEq/L (ref 3.5–5.1)
Sodium: 141 mEq/L (ref 135–145)

## 2018-10-06 LAB — LIPID PANEL
Cholesterol: 117 mg/dL (ref 0–200)
HDL: 41.6 mg/dL (ref >39.00)
LDL Cholesterol: 53 mg/dL (ref 0–99)
NonHDL: 75.85
Total CHOL/HDL Ratio: 3
Triglycerides: 114 mg/dL (ref 0.0–149.0)
VLDL: 22.8 mg/dL (ref 0.0–40.0)

## 2018-10-06 LAB — HEMOGLOBIN A1C: Hgb A1c MFr Bld: 6 % (ref 4.6–6.5)

## 2018-10-10 ENCOUNTER — Encounter: Payer: Self-pay | Admitting: Internal Medicine

## 2018-10-10 ENCOUNTER — Ambulatory Visit (INDEPENDENT_AMBULATORY_CARE_PROVIDER_SITE_OTHER): Payer: Medicare Other | Admitting: Internal Medicine

## 2018-10-10 ENCOUNTER — Other Ambulatory Visit: Payer: Self-pay

## 2018-10-10 DIAGNOSIS — N183 Chronic kidney disease, stage 3 unspecified: Secondary | ICD-10-CM

## 2018-10-10 DIAGNOSIS — K2100 Gastro-esophageal reflux disease with esophagitis, without bleeding: Secondary | ICD-10-CM

## 2018-10-10 DIAGNOSIS — R739 Hyperglycemia, unspecified: Secondary | ICD-10-CM

## 2018-10-10 DIAGNOSIS — J449 Chronic obstructive pulmonary disease, unspecified: Secondary | ICD-10-CM

## 2018-10-10 DIAGNOSIS — Z9109 Other allergy status, other than to drugs and biological substances: Secondary | ICD-10-CM | POA: Diagnosis not present

## 2018-10-10 DIAGNOSIS — I272 Pulmonary hypertension, unspecified: Secondary | ICD-10-CM | POA: Diagnosis not present

## 2018-10-10 DIAGNOSIS — I10 Essential (primary) hypertension: Secondary | ICD-10-CM | POA: Diagnosis not present

## 2018-10-10 DIAGNOSIS — K2270 Barrett's esophagus without dysplasia: Secondary | ICD-10-CM

## 2018-10-10 DIAGNOSIS — D649 Anemia, unspecified: Secondary | ICD-10-CM

## 2018-10-10 DIAGNOSIS — E7800 Pure hypercholesterolemia, unspecified: Secondary | ICD-10-CM

## 2018-10-10 DIAGNOSIS — R0902 Hypoxemia: Secondary | ICD-10-CM | POA: Diagnosis not present

## 2018-10-10 DIAGNOSIS — I340 Nonrheumatic mitral (valve) insufficiency: Secondary | ICD-10-CM
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
## 2018-10-15 ENCOUNTER — Telehealth: Payer: Self-pay | Admitting: Internal Medicine

## 2018-10-15 ENCOUNTER — Encounter: Payer: Self-pay | Admitting: Internal Medicine

## 2018-10-15 NOTE — Assessment & Plan Note (Signed)
Upper symptoms controlled.  

## 2018-10-15 NOTE — Telephone Encounter (Signed)
-----   Message from Minna Merritts, MD sent at 10/15/2018  2:41 PM EDT ----- Regarding: RE: update I think I would hold the entresto  She will need with lasix/aldactone given severe pulm HTN on echos EF stable since 2017 Triage can we call her to hold the entresto as BP is low Thx TG  ----- Message ----- From: [name], MD Sent: 10/15/2018   9:01 AM EDT To: Minna Merritts, MD Subject: update                                         Ms Vandekamp had a f/u appt with me this past week.  She had a visit with you in May.  Her blood pressure at that visit was low.  She has continued to have low pressures (80-90s/60s).  I discussed with her regarding medication adjustment.  She wanted to make you aware of her continued low pressures and get your advice on adjusting her medication.  She is currently on entresto, spironolactone, metoprolol and lasix.  I discussed with her regarding adjusting metoprolol or lasix.  If you do not mind reviewing, I would like to get your recommendations regarding possible medication adjustment.  Thank you for your help.    [name]

## 2018-10-15 NOTE — Assessment & Plan Note (Signed)
Follow met b and a1c.

## 2018-10-15 NOTE — Assessment & Plan Note (Signed)
Controlled on current regimen.  Follow.  

## 2018-10-15 NOTE — Assessment & Plan Note (Signed)
Currently doing well on current regimen.  Breathing stable.  Continue nebs, inhalers.  Follow.

## 2018-10-15 NOTE — Assessment & Plan Note (Signed)
Followed by cardiology 

## 2018-10-15 NOTE — Assessment & Plan Note (Signed)
Symptoms controlled

## 2018-10-15 NOTE — Assessment & Plan Note (Signed)
On crestor.  Follow lipid panel and liver function tests.   

## 2018-10-15 NOTE — Assessment & Plan Note (Signed)
Followed by nephrology.  Renal ultrasound unremarkable.  Continue to avoid antiinflammatories.  Follow metabolic panel.

## 2018-10-15 NOTE — Assessment & Plan Note (Signed)
Has been followed by hematology.  °

## 2018-10-15 NOTE — Assessment & Plan Note (Signed)
Saw Dr Rockey Situ.  Note reviewed.  No changes made. Instructed to follow blood pressures.  Breathing stable.

## 2018-10-15 NOTE — Assessment & Plan Note (Signed)
Blood pressure as outlined.  Low pressures.  She had discussed with cardiology.  Will contact cardiology to discuss medication adjustment.

## 2018-10-16 ENCOUNTER — Telehealth: Payer: Self-pay

## 2018-10-16 NOTE — Telephone Encounter (Signed)
Called to give pt Dr. Donivan Scull recommendation.[email or web address].

## 2018-10-16 NOTE — Telephone Encounter (Signed)
-----   Message from Minna Merritts, MD sent at 10/15/2018  2:41 PM EDT ----- Regarding: RE: update I think I would hold the entresto  She will need with lasix/aldactone given severe pulm HTN on echos EF stable since 2017 Triage can we call her to hold the entresto as BP is low Thx TG  ----- Message ----- From: [name], MD Sent: 10/15/2018   9:01 AM EDT To: Minna Merritts, MD Subject: update                                         Ms Novello had a f/u appt with me this past week.  She had a visit with you in May.  Her blood pressure at that visit was low.  She has continued to have low pressures (80-90s/60s).  I discussed with her regarding medication adjustment.  She wanted to make you aware of her continued low pressures and get your advice on adjusting her medication.  She is currently on entresto, spironolactone, metoprolol and lasix.  I discussed with her regarding adjusting metoprolol or lasix.  If you do not mind reviewing, I would like to get your recommendations regarding possible medication adjustment.  Thank you for your help.    [name]

## 2018-10-16 NOTE — Telephone Encounter (Signed)
Spoke with the pt and made her aware of Dr/ Gollan's recommendation to [employer]. Adv the pt to continue to monitor her BP daily for the next 2 weeks and provide the readings for Dr. Rockey Situ to review. Pt verbalized understanding and will send the BP readings through mychart.

## 2018-10-17 ENCOUNTER — Other Ambulatory Visit: Payer: Self-pay | Admitting: *Deleted

## 2018-10-17 MED ORDER — METOPROLOL TARTRATE 50 MG PO TABS: 25.0000 mg | ORAL_TABLET | Freq: Two times a day (BID) | ORAL | 2 refills | Status: DC

## 2018-10-26 ENCOUNTER — Encounter: Payer: Self-pay | Admitting: Internal Medicine

## 2018-10-27 ENCOUNTER — Other Ambulatory Visit: Payer: Self-pay

## 2018-10-27 MED ORDER — ROSUVASTATIN CALCIUM 10 MG PO TABS: 10.0000 mg | ORAL_TABLET | Freq: Every day | ORAL | 1 refills | Status: DC

## 2018-10-27 NOTE — Telephone Encounter (Signed)
I do not mind refilling the medication, but need to confirm.  Is she having difficulty breathing causing her to use the neb more?  Ok to refill, but also needs to continue f/u with pulmonary.  Let me know if any problems.

## 2018-10-27 NOTE — Telephone Encounter (Signed)
I have refilled her crestor but are you ok with filling her neb treatments? She sees Dr. Alva Garnet at pulmonary

## 2018-10-31 ENCOUNTER — Other Ambulatory Visit: Payer: Self-pay

## 2018-10-31 MED ORDER — ALBUTEROL SULFATE (2.5 MG/3ML) 0.083% IN NEBU: 2.5000 mg | INHALATION_SOLUTION | Freq: Four times a day (QID) | RESPIRATORY_TRACT | 3 refills | Status: DC | PRN

## 2018-10-31 NOTE — Telephone Encounter (Signed)
Confirmed with pt that she is not having increased SOB. She is not using neb more. She is is actually using it less than what it is written for. Only using twice a day most of the time. Pt is going to call and schedule f/u with Dr Anita Ferrell. Rx for ned solution sent in

## 2018-11-06 ENCOUNTER — Encounter: Payer: Self-pay | Admitting: Internal Medicine

## 2018-11-08 ENCOUNTER — Other Ambulatory Visit: Payer: Self-pay

## 2018-11-08 DIAGNOSIS — R0602 Shortness of breath: Secondary | ICD-10-CM

## 2018-11-08 MED ORDER — ALBUTEROL SULFATE (2.5 MG/3ML) 0.083% IN NEBU: 2.5000 mg | INHALATION_SOLUTION | Freq: Four times a day (QID) | RESPIRATORY_TRACT | 3 refills | Status: DC | PRN

## 2018-11-14 ENCOUNTER — Ambulatory Visit (INDEPENDENT_AMBULATORY_CARE_PROVIDER_SITE_OTHER): Payer: Medicare Other | Admitting: Pulmonary Disease

## 2018-11-14 ENCOUNTER — Encounter: Payer: Self-pay | Admitting: Pulmonary Disease

## 2018-11-14 DIAGNOSIS — R05 Cough: Secondary | ICD-10-CM | POA: Diagnosis not present

## 2018-11-14 DIAGNOSIS — J449 Chronic obstructive pulmonary disease, unspecified: Secondary | ICD-10-CM

## 2018-11-14 DIAGNOSIS — R053 Chronic cough: Secondary | ICD-10-CM

## 2018-11-14 MED ORDER — ANORO ELLIPTA 62.5-25 MCG/INH IN AEPB: INHALATION_SPRAY | RESPIRATORY_TRACT | 6 refills | Status: DC

## 2018-11-14 MED ORDER — ALBUTEROL SULFATE HFA 108 (90 BASE) MCG/ACT IN AERS: 1.0000 | INHALATION_SPRAY | Freq: Four times a day (QID) | RESPIRATORY_TRACT | 5 refills | Status: DC | PRN

## 2018-11-14 MED ORDER — ALBUTEROL SULFATE (2.5 MG/3ML) 0.083% IN NEBU: 2.5000 mg | INHALATION_SOLUTION | Freq: Four times a day (QID) | RESPIRATORY_TRACT | 5 refills | Status: DC | PRN

## 2018-11-14 NOTE — Patient Instructions (Addendum)
Continue Anoro inhaler, 1 inhalation daily. Continue albuterol (may be used either as inhaler or nebulizer) up to every 6 hours as needed for increased shortness of breath, wheezing, chest tightness, cough Remember to get your flu shot this year Refills have been entered on all of the above medications Follow-up in 4-6 months with Dr. Patsey Berthold

## 2018-11-14 NOTE — Progress Notes (Signed)
PULMONARY OFFICE FOLLOW UP NOTE  Requesting MD/Service: Self Date of initial consultation: 06/20/15 Reason for consultation: COPD, lung nodule  PROBLEMS:  Remote smoker Moderate COPD/emphysema Lung nodule -decreased in size, no further follow-up H/O positive PPD - S/P 9 months INH.   Data: CT chest 07/29/15:Significant decrease in size of the previously demonstrated right lower lobe nodule, compatible with a benign process. No significant change in multiple additional sub cm nodules and calcified granulomata in both lungs, compatible with a benign process. Mild changes of COPD and chronic bronchitis. Resolved infection in the lingula and left lower lobe PFTs 07/29/15: moderate obstruction, normal lung volumes, mild reduction DLCO.  TEE 10/19/16: LVEF 45%. Moderate mitral regurgitation [facility] 10/25/16: Nonobstructive coronary disease. Moderate pulmonary hypertension (systolic 50 mmHg, mean 30 mmHg)  Virtual Visit via Telephone Note I connected with [place] F Brendlinger on 11/14/18 at 10:30 AM EDT by telephone and verified that I am speaking with the correct person using two identifiers. I discussed the limitations, risks, security and privacy concerns of performing an evaluation and management service by telephone and the availability of in person appointments. I also discussed with the patient that there may be a patient responsible charge related to this service. The patient expressed understanding and agreed to proceed.  INTERVAL: Last seen 11/15/2017. Seen in [facility] ED 05/27/18 with AECOPD. Prescribed nebulized albuterol    SUBJ: This is a follow-up that was scheduled at the recommendation of Dr. [name].  It was performed remotely (via telephone) due to the rhinovirus pandemic.  Since last encounter a year ago, she is done fairly well but did have one exacerbation for which she was seen in the [facility] ED and diagnosed with COPD exacerbation.  At that time, she was prescribed nebulized  albuterol.  She remains on Anoro inhaler daily and is using it compliantly.  She uses her nebulized albuterol on average about 2 times per day.  Most consistent reason to use it would be increased cough.  She has only mild exertional dyspnea.  Her cough is productive of scant mucus.  She has had no hemoptysis.  She denies purulent sputum, fever, chest pain, lower extremity edema, calf tenderness.   OBJ: There were no vitals filed for this visit. EXAM:  Due to the remote nature of this encounter, no physical examination could be performed   DATA:   BMP Latest Ref Rng & Units 10/06/2018 05/27/2018 05/11/2018  Glucose 70 - 99 mg/dL 104(H) 116(H) -  BUN 6 - 23 mg/dL 15 22 21   Creatinine 0.40 - 1.20 mg/dL 1.43(H) 1.50(H) 1.5(A)  Sodium 135 - 145 mEq/L 141 141 142  Potassium 3.5 - 5.1 mEq/L 4.3 3.5 4.5  Chloride 96 - 112 mEq/L 103 103 -  CO2 19 - 32 mEq/L 30 29 -  Calcium 8.4 - 10.5 mg/dL 9.4 9.5 -    CBC Latest Ref Rng & Units 05/27/2018 05/11/2018 11/24/2017  WBC 4.0 - 10.5 K/uL 2.8(L) 3.2 3.4(L)  Hemoglobin 12.0 - 15.0 g/dL 10.6(L) 10.8(A) 10.9(L)  Hematocrit 36.0 - 46.0 % 33.6(L) 32(A) 33.4(L)  Platelets 150 - 400 K/uL 152 199 161    CXR 05/27/18: Hyperlucency and mild hyperinflation with scattered calcified granulomata and linear scarring in lingula.  No acute findings   IMPRESSION:   COPD, moderate -overall, well controlled on Anoro and albuterol as needed.   Mild chronic cough due to chronic bronchitis   PLAN:  1) continue Anoro inhaler, 1 inhalation daily. 2) continue albuterol (either as inhaler or nebulizer)  up to every 6 hours as needed for increased shortness of breath, wheezing, chest tightness, cough 3) I encouraged that she get her flu shot this year 4) refills have been entered on all of the above medications 5) follow-up in 4-6 months with Dr. Joellyn Rued, MD PCCM service Mobile [phone] Pager [phone] 11/14/2018 10:44 AM

## 2018-11-16 ENCOUNTER — Encounter: Payer: Self-pay | Admitting: Internal Medicine

## 2018-12-15 ENCOUNTER — Ambulatory Visit (INDEPENDENT_AMBULATORY_CARE_PROVIDER_SITE_OTHER): Payer: Medicare Other

## 2018-12-15 ENCOUNTER — Other Ambulatory Visit: Payer: Self-pay

## 2018-12-15 DIAGNOSIS — Z23 Encounter for immunization: Secondary | ICD-10-CM | POA: Diagnosis not present

## 2019-01-14 ENCOUNTER — Other Ambulatory Visit: Payer: Self-pay | Admitting: Cardiovascular Disease

## 2019-02-18 ENCOUNTER — Other Ambulatory Visit: Payer: Self-pay | Admitting: Cardiovascular Disease

## 2019-02-19 NOTE — Telephone Encounter (Signed)
Please schedule F/U appointment with Dr. Gollan. Thank you! 

## 2019-02-20 NOTE — Telephone Encounter (Signed)
No ans no vm   °

## 2019-02-21 ENCOUNTER — Ambulatory Visit (INDEPENDENT_AMBULATORY_CARE_PROVIDER_SITE_OTHER): Payer: Medicare Other

## 2019-02-21 ENCOUNTER — Other Ambulatory Visit: Payer: Self-pay

## 2019-02-21 DIAGNOSIS — Z0000 Encounter for general adult medical examination without abnormal findings: Secondary | ICD-10-CM

## 2019-02-21 NOTE — Progress Notes (Addendum)
Subjective:   [name] is a 85 y.o. female who presents for Medicare Annual (Subsequent) preventive examination.  Review of Systems:  No ROS.  Medicare Wellness Virtual Visit.  Visual/audio telehealth visit, UTA vital signs.   See social history for additional risk factors.   Cardiac Risk Factors include: advanced age (>42men, >70 women);hypertension     Objective:     Vitals: There were no vitals taken for this visit.  There is no height or weight on file to calculate BMI.  Advanced Directives 02/21/2019 05/27/2018 02/14/2018 11/24/2017 08/16/2017 12/01/2016 03/11/2015  Does Patient Have a Medical Advance Directive? No No No No No No No  Would patient like information on creating a medical advance directive? No - Patient declined No - Patient declined No - Patient declined - No - Patient declined No - Patient declined No - patient declined information    Tobacco Social History   Tobacco Use  Smoking Status Former Smoker  . Packs/day: 1.00  . Years: 40.00  . Pack years: 40.00  . Types: Cigarettes  . Quit date: 08/17/1990  . Years since quitting: 28.5  Smokeless Tobacco Never Used  Tobacco Comment   Quit in 1992     Counseling given: Not Answered Comment: Quit in 1992   Clinical Intake:  Pre-visit preparation completed: Yes           How often do you need to have someone help you when you read instructions, pamphlets, or other written materials from your doctor or pharmacy?: 2 - Rarely  Interpreter Needed?: No     Past Medical History:  Diagnosis Date  . A-fib ([place])   . Allergy   . Asthma   . CHF (congestive heart failure) ([place])   . COPD (chronic obstructive pulmonary disease) ([place])   . GERD (gastroesophageal reflux disease)   . Hypercholesteremia   . Hypertension   . Mitral valve insufficiency   . Pulmonary hypertension ([facility])    Right Ventricular systolic pressure at 60 mm Hg by echo on july of 2011; Right heart cardic catherization revealed  pulmonary artery pressusre of 27/10 with a mean of; Ventricular pressure 29/10 with pulmonary capillary wedge at 9  . TB (pulmonary tuberculosis)    treated with INH therapy    Past Surgical History:  Procedure Laterality Date  . CARDIAC CATHETERIZATION  10/2016   [facility]  . CATARACT EXTRACTION Bilateral 2008  . CHOLECYSTECTOMY    . TONSILLECTOMY  1964   Family History  Problem Relation Age of Onset  . Heart disease Mother   . Heart disease Father   . Hypertension Sister   . Heart disease Brother   . Breast cancer Maternal Aunt    Social History   Socioeconomic History  . Marital status: Widowed    Spouse name: Not on file  . Number of children: 3  . Years of education: Not on file  . Highest education level: Not on file  Occupational History  . Not on file  Social Needs  . Financial resource strain: Not hard at all  . Food insecurity    Worry: Never true    Inability: Never true  . Transportation needs    Medical: No    Non-medical: No  Tobacco Use  . Smoking status: Former Smoker    Packs/day: 1.00    Years: 40.00    Pack years: 40.00    Types: Cigarettes    Quit date: 08/17/1990    Years since quitting: 28.5  .  Smokeless tobacco: Never Used  . Tobacco comment: Quit in 1992  Substance and Sexual Activity  . Alcohol use: No    Alcohol/week: 0.0 standard drinks  . Drug use: No  . Sexual activity: Not Currently  Lifestyle  . Physical activity    Days per week: 0 days    Minutes per session: Not on file  . Stress: Not at all  Relationships  . Social Herbalist on phone: Not on file    Gets together: Not on file    Attends religious service: Not on file    Active member of club or organization: Not on file    Attends meetings of clubs or organizations: Not on file    Relationship status: Not on file  Other Topics Concern  . Not on file  Social History Narrative  . Not on file    Outpatient Encounter Medications as of 02/21/2019  Medication  Sig  . acetaminophen (TYLENOL) 500 MG tablet Take 500 mg by mouth as needed.   Marland Kitchen albuterol (PROVENTIL) (2.5 MG/3ML) 0.083% nebulizer solution Take 3 mLs (2.5 mg total) by nebulization every 6 (six) hours as needed for wheezing or shortness of breath. DX: R06.02, R06.2  . albuterol (VENTOLIN HFA) 108 (90 Base) MCG/ACT inhaler Inhale 1-2 puffs into the lungs every 6 (six) hours as needed for wheezing or shortness of breath.  Marland Kitchen aspirin EC 81 MG tablet Take 81 mg by mouth daily.   Marland Kitchen azelastine (ASTELIN) 0.1 % nasal spray USE 1 SPRAY EACH NOSTRIL TWO TIMES DAILY  . Calcium Carbonate-Vitamin D (CALCIUM 600+D) 600-400 MG-UNIT tablet Take 1 tablet by mouth at bedtime.  . ferrous sulfate 325 (65 FE) MG tablet Take 325 mg by mouth 2 (two) times daily with a meal.   . fexofenadine (ALLEGRA) 180 MG tablet Take 180 mg by mouth daily.   . furosemide (LASIX) 40 MG tablet TAKE 1 TABLET BY MOUTH EVERY OTHER DAY  . lansoprazole (PREVACID) 30 MG capsule Take 30 mg by mouth daily.   . meclizine (ANTIVERT) 25 MG tablet Take 25 mg by mouth as needed for dizziness.  . metoprolol tartrate (LOPRESSOR) 50 MG tablet Take 0.5 tablets (25 mg total) by mouth 2 (two) times daily.  . Multiple Vitamins-Minerals (CENTRUM SILVER PO) Take by mouth.  . Probiotic Product (ALIGN PO) Take by mouth.  . rosuvastatin (CRESTOR) 10 MG tablet Take 1 tablet (10 mg total) by mouth daily.  Marland Kitchen spironolactone (ALDACTONE) 25 MG tablet TAKE 1/2 TABLET BY MOUTH EVERY DAY  . umeclidinium-vilanterol (ANORO ELLIPTA) 62.5-25 MCG/INH AEPB TAKE 1 PUFF BY MOUTH INTO THE LUNGS DAILY  . [DISCONTINUED] sacubitril-valsartan (ENTRESTO) 24-26 MG Take 1 tablet by mouth 2 (two) times daily.   No facility-administered encounter medications on file as of 02/21/2019.     Activities of Daily Living In your present state of health, do you have any difficulty performing the following activities: 02/21/2019  Hearing? N  Vision? N  Difficulty concentrating or making  decisions? N  Walking or climbing stairs? Y  Comment States she avoids the stairs  Dressing or bathing? N  Doing errands, shopping? Y  Comment She does not Physiological scientist and eating ? Y  Using the Toilet? N  In the past six months, have you accidently leaked urine? Y  Comment Managed with daily liner  Do you have problems with loss of bowel control? N  Managing your Medications? N  Managing your Finances? N  Housekeeping or  managing your Housekeeping? Y  Some recent data might be hidden    Patient Care Team: [name], MD as PCP - General (Internal Medicine)    Assessment:   This is a routine wellness examination for [place].  Nurse connected with patient 02/21/19 at 11:30 AM EST by a telephone enabled telemedicine application and verified that I am speaking with the correct person using two identifiers. Patient stated full name and DOB. Patient gave permission to continue with virtual visit. Patient's location was at home and Nurse's location was at [place] office.   Health Maintenance Due: -Dexa Scan- she plans to schedule with her doctor later in season. -Mammogram- she plans to schedule with her doctor later in season. Update all pending maintenance due as appropriate.   See completed HM at the end of note.   Eye: Visual acuity not assessed. Virtual visit. Wears glasses when reading.   Dental: Dentures- yes  Hearing: Demonstrates normal hearing during visit.  Safety:  Patient feels safe at home- yes.  She lives with her son. Patient does have smoke detectors at home- yes Patient does wear sunscreen or protective clothing when in direct sunlight - yes Patient does wear seat belt when in a moving vehicle - yes Patient drives- no Adequate lighting in walkways free from debris- yes Grab bars and handrails used as appropriate- yes Ambulates with no assistive device  Social: Alcohol intake - no     Smoking history- former    Smokers in home? none  Illicit drug use? none  Depression: PHQ 2 &9 complete. See screening below. Denies irritability, anhedonia, sadness/tearfullness.    Falls: See screening below.    Medication: Taking as directed and without issues.   Covid-19: Precautions and sickness symptoms discussed. Wears mask, social distancing, hand hygiene as appropriate.   Activities of Daily Living Patient denies needing assistance with: household chores, feeding themselves, getting from bed to chair, getting to the toilet, bathing/showering, dressing, managing money, or preparing meals.   Memory: Patient is alert. Patient denies difficulty focusing or concentrating. Correctly identified the president of the [place], season and recall. Patient likes to read, play on the computer, and complete puzzles for brain stimulation.   BMI- discussed the importance of a healthy diet, water intake and the benefits of aerobic exercise.  Educational material provided.  Physical activity- active around the home, no routine  Diet:  Regular Water: good intake Caffeine: 1 cup daily  Other Providers Patient Care Team: [name], MD as PCP - General (Internal Medicine)  Exercise Activities and Dietary recommendations Current Exercise Habits: Home exercise routine, Intensity: Mild  Goals    . DIET - INCREASE WATER INTAKE     Stay hydrated       Fall Risk Fall Risk  03/06/2018 02/14/2018 12/01/2016 02/24/2016 01/13/2016  Falls in the past year? 0 No No No No  Comment Emmi Telephone Survey: data to providers prior to load - - - -   Timed Get Up and Go performed: no, virtual visit  Depression Screen PHQ 2/9 Scores 02/21/2019 02/14/2018 12/01/2016 02/24/2016  PHQ - 2 Score 0 0 0 0     Cognitive Function     6CIT Screen 02/21/2019 02/14/2018 12/01/2016  What Year? 0 points 0 points 0 points  What month? 0 points 0 points 0 points  What time? 0 points 0 points 0 points  Count back from 20 0 points 0 points 0 points  Months  in reverse 0 points 0 points 0 points  Repeat phrase 0 points 0 points 0 points  Total Score 0 0 0    Immunization History  Administered Date(s) Administered  . Fluad Quad(high Dose 65+) 12/15/2018  . Influenza, High Dose Seasonal PF 01/13/2016, 01/25/2017, 01/10/2018  . Influenza,inj,Quad PF,6+ Mos 12/26/2012, 01/12/2014, 01/14/2015  . Pneumococcal Conjugate-13 09/02/2014  . Pneumococcal Polysaccharide-23 11/14/2012  . Tdap 11/14/2012   Screening Tests Health Maintenance  Topic Date Due  . DEXA SCAN  05/09/1999  . MAMMOGRAM  09/08/2018  . COLONOSCOPY  05/08/2019  . TETANUS/TDAP  11/15/2022  . INFLUENZA VACCINE  Completed  . PNA vac Low Risk Adult  Completed      Plan:   Keep all routine maintenance appointments.   Medicare Attestation I have personally reviewed: The patient's medical and social history Their use of alcohol, tobacco or illicit drugs Their current medications and supplements The patient's functional ability including ADLs,fall risks, home safety risks, cognitive, and hearing and visual impairment Diet and physical activities Evidence for depression   In addition, I have reviewed and discussed with patient certain preventive protocols, quality metrics, and best practice recommendations. A written personalized care plan for preventive services as well as general preventive health recommendations were provided to patient via mail.     Varney Biles, LPN  29/05/4460   Reviewed above information.  Agree with assessment and plan.    Dr Nicki Reaper

## 2019-02-21 NOTE — Patient Instructions (Addendum)
  Anita Ferrell , Thank you for taking time to come for your Medicare Wellness Visit. I appreciate your ongoing commitment to your health goals. Please review the following plan we discussed and let me know if I can assist you in the future.   These are the goals we discussed: Goals    . DIET - INCREASE WATER INTAKE     Stay hydrated       This is a list of the screening recommended for you and due dates:  Health Maintenance  Topic Date Due  . DEXA scan (bone density measurement)  05/09/1999  . Mammogram  09/08/2018  . Colon Cancer Screening  05/08/2019  . Tetanus Vaccine  11/15/2022  . Flu Shot  Completed  . Pneumonia vaccines  Completed   

## 2019-03-09 ENCOUNTER — Other Ambulatory Visit: Payer: Self-pay

## 2019-03-20 NOTE — Telephone Encounter (Signed)
Scheduled

## 2019-04-03 ENCOUNTER — Other Ambulatory Visit: Payer: Self-pay | Admitting: Internal Medicine

## 2019-04-03 NOTE — Progress Notes (Signed)
Virtual Visit via Telephone Note   This visit type was conducted due to national recommendations for restrictions regarding the COVID-19 Pandemic (e.g. social distancing) in an effort to limit this patient's exposure and mitigate transmission in our community.  Due to her co-morbid illnesses, this patient is at least at moderate risk for complications without adequate follow up.  This format is felt to be most appropriate for this patient at this time.  The patient did not have access to video technology/had technical difficulties with video requiring transitioning to audio format only (telephone).  All issues noted in this document were discussed and addressed.  No physical exam could be performed with this format.  Please refer to the patient's chart for her  consent to telehealth for [facility].   I connected with  [place] F Parthasarathy on 04/03/19 by a video enabled telemedicine application and verified that I am speaking with the correct person using two identifiers. I discussed the limitations of evaluation and management by telemedicine. The patient expressed understanding and agreed to proceed.   Evaluation Performed:  Follow-up visit  Date:  04/03/2019   ID:  ELESHIA WOOLEY, DOB 1932-07-20, MRN [id number]  Patient Location:  [place] [address]   Provider location:   [facility], [place] office  PCP:  [name], MD  Cardiologist:  Arvid Right [facility]   Chief Complaint  Patient presents with  . other    6 month follow up. Meds reviewed by the pt. verbally. Pt. c/o shortness of breath and cough.      History of Present Illness:    [name] is a 85 y.o. female who presents via audio/video conferencing for a telehealth visit today.   The patient does not symptoms concerning for COVID-19 infection (fever, chills, cough, or new SHORTNESS OF BREATH).   Patient has a past medical history of Pulmonary hypertension HTN,   COPD, smoker 85 yo quit 56 Moderate Mitral Regurg, moderate tricuspid valve regurgitation  chronic shortness of breath with exertion.  2015  PFT reports of an FEV1 of 1.1 L.  Nonobstructive coronary disease by catheterization July 2018 Moderately elevated right heart pressures with normal PCWP of 8, by right heart catheterization , on Lasix every other day Echo in 08/2015: EF 35% Mildly reduced LV function, LVEF approximately 45% by TEE at [place] presents for mitral valve regurgitation, shortness of breath, pulm HTN  In the ER 05/2018 SOB, records reviewed  No new complaints, feels well Continues to take Lasix every other day  Chronic SOB secondary to COPD, pulmonary hypertension No leg edema abdominal bloating  Lab work reviewed Total chol 117 LDL 53 HBA1C 6.0  RHC, at [facility] in 2018, prior records reviewed with her 1. Cardiac index 2.2 L/m/m2 at rest with PCWP 8 mmHg. 2. Moderate pulmonary hypertension in the absence of elevated PCWP, PVR 6.0 Wu. 3. Non obstructive CAD medical therapy of moderate MR and severe COPD  On inhalers avg weight 142  To 143 pounds today Takes extra lasix for weight gain Goal weight <145   Prior CV studies:   The following studies were reviewed today:  Right Ventricular systolic pressure at 60 mm Hg by echo on july of 2011; Right heart cardic catherization revealed pulmonary artery pressusre of 27/10 with a mean of; Ventricular pressure 29/10 with pulmonary capillary wedge at 9  Records from [place] reviewed with her in detail on today's visit Cath 10/2016 1.Cardiac index 2.2 L/m/m2  at rest with PCWP 8 mmHg. 2.Moderate pulmonary hypertension in the absence of elevated PCWP, PVR 6.0 Wu. 3.Non obstructive CAD Plan medical therapy of moderate MR and severe COPD   Previously followed by Dr Aline Brochure at [facility].  ECHO - mild to moderate left ventricular dysfunction with moderate to severe MR and moderate TR.   TEE: 1. Moderate  Functional MR (EROA by PISA = 0.1) 2. Moderate TR 3. Mild PR 4. No interatrial septal communication 5. Mildly reduced LV function, LVEF approximately 45%  Lab work reviewed with creatinine 1.4   Past Medical History:  Diagnosis Date  . A-fib ([place])   . Allergy   . Asthma   . CHF (congestive heart failure) ([place])   . COPD (chronic obstructive pulmonary disease) ([place])   . GERD (gastroesophageal reflux disease)   . Hypercholesteremia   . Hypertension   . Mitral valve insufficiency   . Pulmonary hypertension (HCC)    Right Ventricular systolic pressure at 60 mm Hg by echo on july of 2011; Right heart cardic catherization revealed pulmonary artery pressusre of 27/10 with a mean of; Ventricular pressure 29/10 with pulmonary capillary wedge at 9  . TB (pulmonary tuberculosis)    treated with INH therapy    Past Surgical History:  Procedure Laterality Date  . CARDIAC CATHETERIZATION  10/2016   [facility]  . CATARACT EXTRACTION Bilateral 2008  . CHOLECYSTECTOMY    . TONSILLECTOMY  1964     No outpatient medications have been marked as taking for the 04/04/19 encounter (Appointment) with Minna Merritts, MD.     Allergies:   Penicillin g and Penicillins   Social History   Tobacco Use  . Smoking status: Former Smoker    Packs/day: 1.00    Years: 40.00    Pack years: 40.00    Types: Cigarettes    Quit date: 08/17/1990    Years since quitting: 28.6  . Smokeless tobacco: Never Used  . Tobacco comment: Quit in 1992  Substance Use Topics  . Alcohol use: No    Alcohol/week: 0.0 standard drinks  . Drug use: No     Current Outpatient Medications on File Prior to Visit  Medication Sig Dispense Refill  . acetaminophen (TYLENOL) 500 MG tablet Take 500 mg by mouth as needed.     Marland Kitchen albuterol (PROVENTIL) (2.5 MG/3ML) 0.083% nebulizer solution Take 3 mLs (2.5 mg total) by nebulization every 6 (six) hours as needed for wheezing or shortness of breath. DX: R06.02, R06.2 360 mL 5   . albuterol (VENTOLIN HFA) 108 (90 Base) MCG/ACT inhaler Inhale 1-2 puffs into the lungs every 6 (six) hours as needed for wheezing or shortness of breath. 18 g 5  . aspirin EC 81 MG tablet Take 81 mg by mouth daily.     Marland Kitchen azelastine (ASTELIN) 0.1 % nasal spray USE 1 SPRAY EACH NOSTRIL TWO TIMES DAILY 30 mL 3  . Calcium Carbonate-Vitamin D (CALCIUM 600+D) 600-400 MG-UNIT tablet Take 1 tablet by mouth at bedtime.    . ferrous sulfate 325 (65 FE) MG tablet Take 325 mg by mouth 2 (two) times daily with a meal.     . fexofenadine (ALLEGRA) 180 MG tablet Take 180 mg by mouth daily.     . furosemide (LASIX) 40 MG tablet TAKE 1 TABLET BY MOUTH EVERY OTHER DAY 30 tablet 2  . lansoprazole (PREVACID) 30 MG capsule Take 30 mg by mouth daily.     . meclizine (ANTIVERT) 25 MG tablet Take 25 mg  by mouth as needed for dizziness.    . metoprolol tartrate (LOPRESSOR) 50 MG tablet Take 0.5 tablets (25 mg total) by mouth 2 (two) times daily. 90 tablet 2  . Multiple Vitamins-Minerals (CENTRUM SILVER PO) Take by mouth.    . Probiotic Product (ALIGN PO) Take by mouth.    . rosuvastatin (CRESTOR) 10 MG tablet Take 1 tablet (10 mg total) by mouth daily. 90 tablet 1  . spironolactone (ALDACTONE) 25 MG tablet TAKE 1/2 TABLET BY MOUTH EVERY DAY 30 tablet 0  . umeclidinium-vilanterol (ANORO ELLIPTA) 62.5-25 MCG/INH AEPB TAKE 1 PUFF BY MOUTH INTO THE LUNGS DAILY 60 each 6   No current facility-administered medications on file prior to visit.     Family Hx: The patient's family history includes Breast cancer in her maternal aunt; Heart disease in her brother, father, and mother; Hypertension in her sister.  ROS:   Please see the history of present illness.    Review of Systems  Constitutional: Negative.   HENT: Negative.   Respiratory: Positive for shortness of breath.   Cardiovascular: Negative.   Gastrointestinal: Negative.   Musculoskeletal: Negative.   Neurological: Negative.   Psychiatric/Behavioral:  Negative.   All other systems reviewed and are negative.    Labs/Other Tests and Data Reviewed:    Recent Labs: 05/27/2018: B Natriuretic Peptide 63.0; Hemoglobin 10.6; Platelets 152 10/06/2018: ALT 13; BUN 15; Creatinine, Ser 1.43; Potassium 4.3; Sodium 141   Recent Lipid Panel Lab Results  Component Value Date/Time   CHOL 117 10/06/2018 08:11 AM   TRIG 114.0 10/06/2018 08:11 AM   HDL 41.60 10/06/2018 08:11 AM   CHOLHDL 3 10/06/2018 08:11 AM   LDLCALC 53 10/06/2018 08:11 AM   LDLDIRECT 127.1 08/25/2012 11:03 AM    Wt Readings from Last 3 Encounters:  10/10/18 144 lb (65.3 kg)  06/06/18 145 lb (65.8 kg)  05/27/18 143 lb (64.9 kg)     Exam:    Vital Signs: Vital signs may also be detailed in the HPI There were no vitals taken for this visit.  Wt Readings from Last 3 Encounters:  10/10/18 144 lb (65.3 kg)  06/06/18 145 lb (65.8 kg)  05/27/18 143 lb (64.9 kg)   Temp Readings from Last 3 Encounters:  10/10/18 97.6 F (36.4 C) (Oral)  06/06/18 97.8 F (36.6 C) (Oral)  05/27/18 (!) 97.4 F (36.3 C) (Oral)   BP Readings from Last 3 Encounters:  10/10/18 (!) 82/61  06/06/18 120/70  05/27/18 124/67   Pulse Readings from Last 3 Encounters:  10/10/18 86  06/06/18 (!) 104  05/27/18 100    Weight, 142-143 pounds  Well nourished, well developed female in no acute distress. Constitutional:  oriented to person, place, and time. No distress.    ASSESSMENT & PLAN:    Pulmonary HTN ([place]) Previous records reviewed, Stable breathing, does not want advanced therapies at this time Takes extra Lasix as needed for weight gain, weight is otherwise stable, 142 to 143 pounds  COPD with hypoxia (HCC) Uses nebulizer, inhalers, stable  no recent COPD exacerbation Last er visit 05/2018  Moderate mitral regurgitation Moderate MR previously seen on echocardiogram Weight stable, breathing stable, no repeat study at this time  CKD (chronic kidney disease) stage 3, GFR 30-59 ml/min  (HCC) No changes made to her medications Periodic BMP in f/u  Essential hypertension Blood pressure is well controlled on today's visit. No changes made to the medications. stable  Hypercholesterolemia Cholesterol is at goal on the current lipid regimen.  No changes to the medications were made.     COVID-19 Education: The signs and symptoms of COVID-19 were discussed with the patient and how to seek care for testing (follow up with PCP or arrange E-visit).  The importance of social distancing was discussed today.  Patient Risk:   After full review of this patients clinical status, I feel that they are at least moderate risk at this time.  Time:   Today, I have spent 25 minutes with the patient with telehealth technology discussing the cardiac and medical problems/diagnoses detailed above   10 min spent reviewing the chart prior to patient visit today   Medication Adjustments/Labs and Tests Ordered: Current medicines are reviewed at length with the patient today.  Concerns regarding medicines are outlined above.   Tests Ordered: No tests ordered   Medication Changes: No changes made   Disposition: Follow-up in 6 months   Signed, Ida Rogue, MD  04/03/2019 1:56 PM    [place] Office [address] [place] [address], [place], [place] [address]

## 2019-04-04 ENCOUNTER — Encounter: Payer: Self-pay | Admitting: Cardiovascular Disease

## 2019-04-04 ENCOUNTER — Telehealth (INDEPENDENT_AMBULATORY_CARE_PROVIDER_SITE_OTHER): Payer: Medicare Other | Admitting: Cardiovascular Disease

## 2019-04-04 ENCOUNTER — Other Ambulatory Visit: Payer: Self-pay

## 2019-04-04 VITALS — BP 109/70 | HR 87 | Temp 97.6°F | Ht 64.5 in | Wt 145.0 lb

## 2019-04-04 DIAGNOSIS — J449 Chronic obstructive pulmonary disease, unspecified: Secondary | ICD-10-CM | POA: Diagnosis not present

## 2019-04-04 DIAGNOSIS — I10 Essential (primary) hypertension: Secondary | ICD-10-CM

## 2019-04-04 DIAGNOSIS — I272 Pulmonary hypertension, unspecified: Secondary | ICD-10-CM

## 2019-04-04 DIAGNOSIS — R0902 Hypoxemia: Secondary | ICD-10-CM

## 2019-04-04 DIAGNOSIS — N183 Chronic kidney disease, stage 3 unspecified: Secondary | ICD-10-CM

## 2019-04-04 DIAGNOSIS — R0602 Shortness of breath: Secondary | ICD-10-CM

## 2019-04-04 DIAGNOSIS — I340 Nonrheumatic mitral (valve) insufficiency: Secondary | ICD-10-CM

## 2019-04-04 NOTE — Patient Instructions (Signed)

## 2019-04-17 ENCOUNTER — Other Ambulatory Visit: Payer: Self-pay | Admitting: Cardiovascular Disease

## 2019-05-04 ENCOUNTER — Encounter: Payer: Self-pay | Admitting: Internal Medicine

## 2019-05-10 ENCOUNTER — Encounter: Payer: Self-pay | Admitting: Internal Medicine

## 2019-05-14 NOTE — Telephone Encounter (Signed)
Pt needs PA for anoro.

## 2019-05-17 NOTE — Telephone Encounter (Signed)
I would like for her to continue to see pulmonary, but I do not want her to be without her medication.  Since she has been followed by pulmonary, can they refill.

## 2019-05-17 NOTE — Telephone Encounter (Signed)
Do you want to start following her anoro or do you want me to have a f/u set up with Dr Patsey Berthold at pulmonary and have her continue to follow?

## 2019-05-18 ENCOUNTER — Telehealth: Payer: Self-pay | Admitting: Lab

## 2019-05-18 NOTE — Telephone Encounter (Signed)
Called Pt back  Pt stated Yes, she will need a PA before seeing Pulmonary.

## 2019-05-18 NOTE — Telephone Encounter (Signed)
Anita Ferrell, I keep getting this message.  This pt needs a prior authorization for Anoro.  Can you send this to one of the CMA's to do the PA.  I am not sure who to send this to.

## 2019-05-18 NOTE — Telephone Encounter (Signed)
It appears she is going to need PA prior to seeing pulmonary.

## 2019-05-21 NOTE — Telephone Encounter (Signed)
This needs to be a PA for Anoro. Not pulmonology ok?

## 2019-05-23 ENCOUNTER — Telehealth: Payer: Self-pay | Admitting: Pulmonary Disease

## 2019-05-23 NOTE — Telephone Encounter (Signed)
PA for Anoro Ellipted has been initiated.

## 2019-05-23 NOTE — Telephone Encounter (Signed)
PA for Anoro Ellipta was approved and good until 04/18/2020. Pt made aware.

## 2019-05-31 IMAGING — US US RENAL
1 series · 14 of 25 positions shown · non-contrast
Comparison: Prior ultrasound from 03/13/2014.

CLINICAL DATA: Initial evaluation for chronic kidney disease stage
3.

EXAM:
RENAL / URINARY TRACT ULTRASOUND COMPLETE

[Series 1: us renal · 0.23mm/px · 14 of 59 slices shown]
[im 1/59]
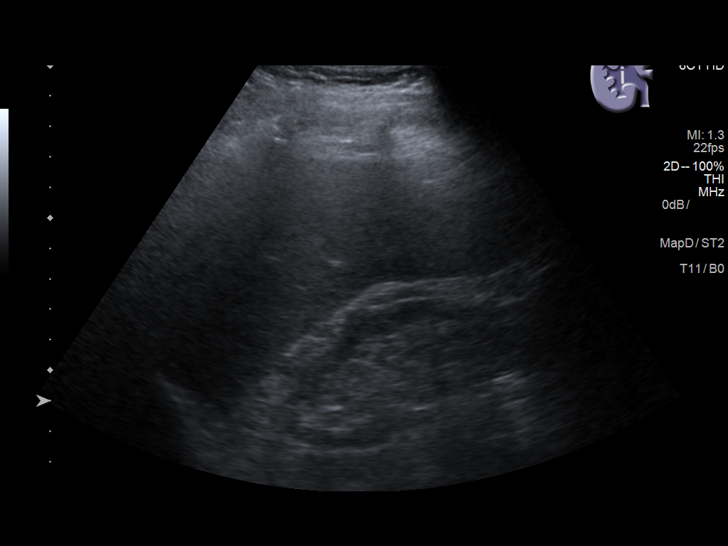
[im 5/59]
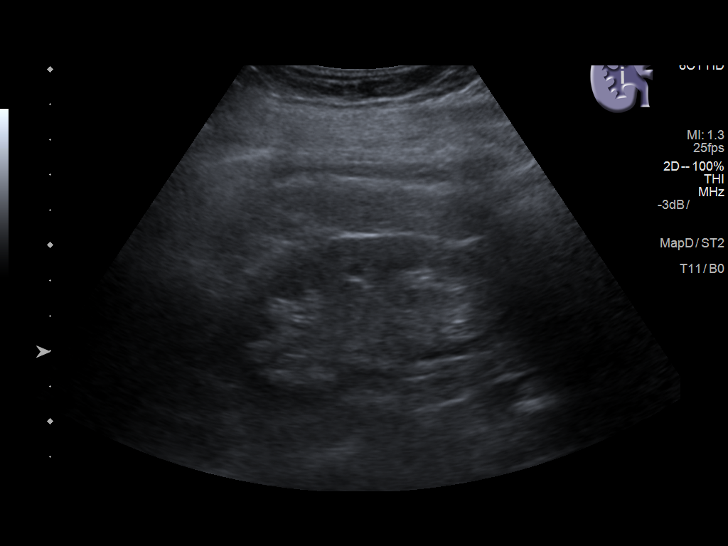
[im 10/59]
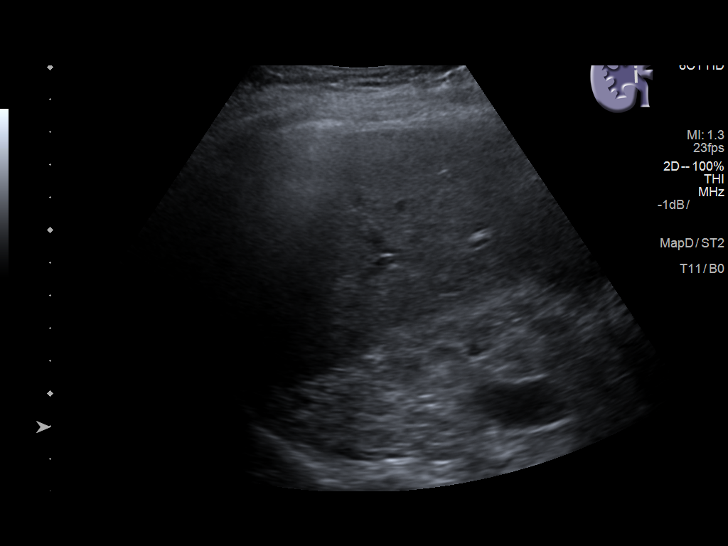
[im 15/59]
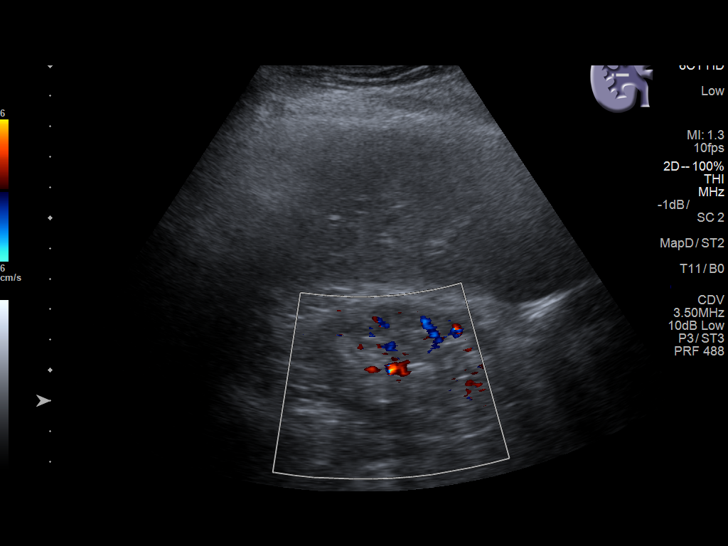
[im 20/59]
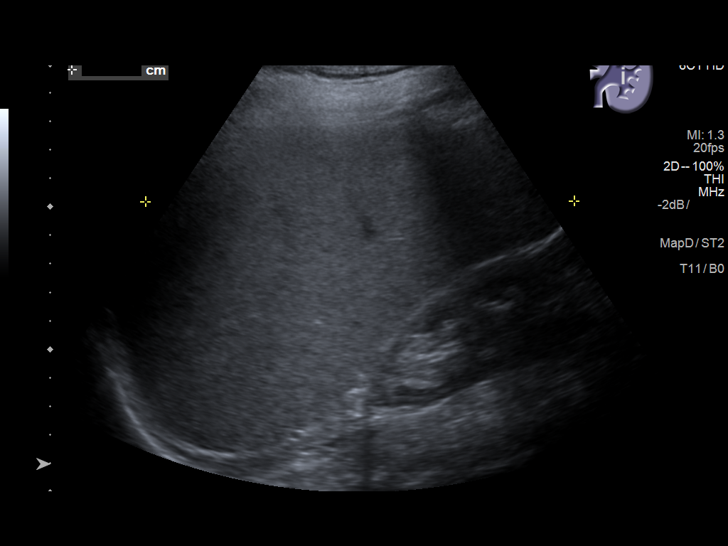
[im 22/59]
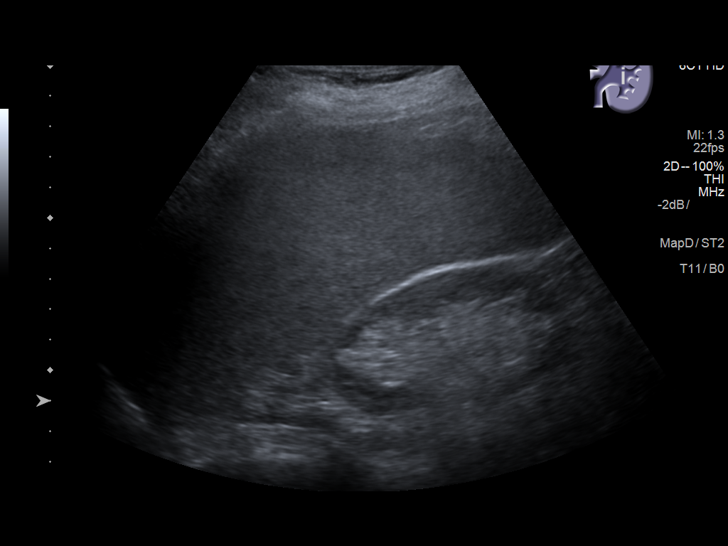
[im 27/59]
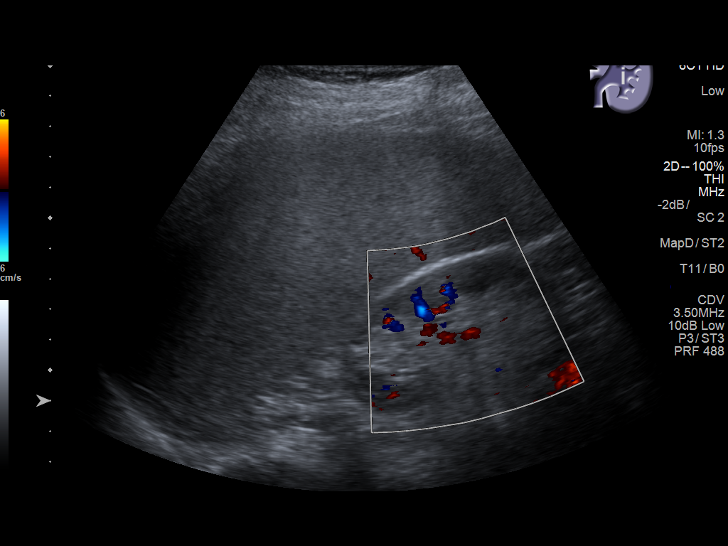
[im 32/59]
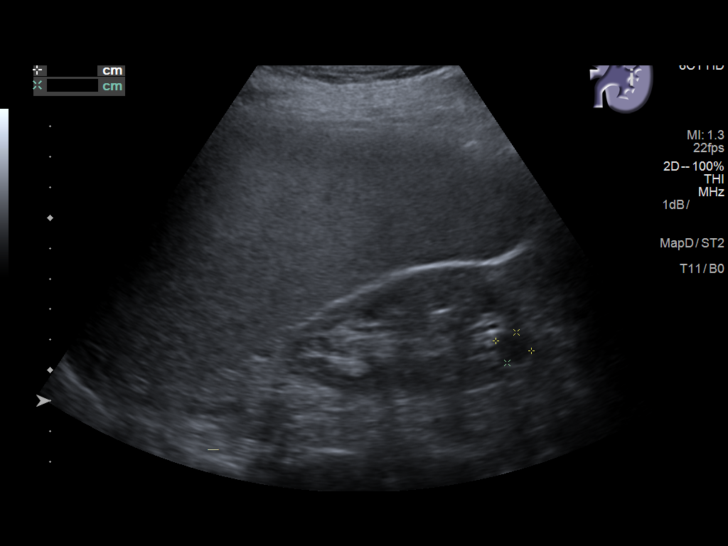
[im 37/59]
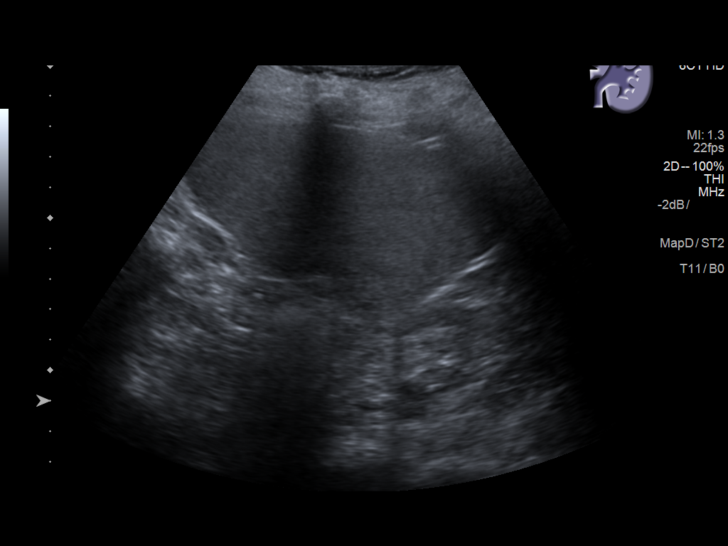
[im 39/59]
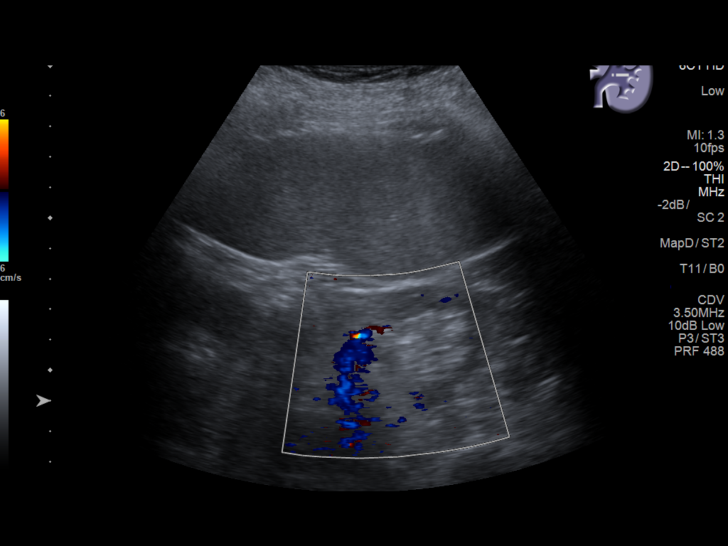
[im 44/59]
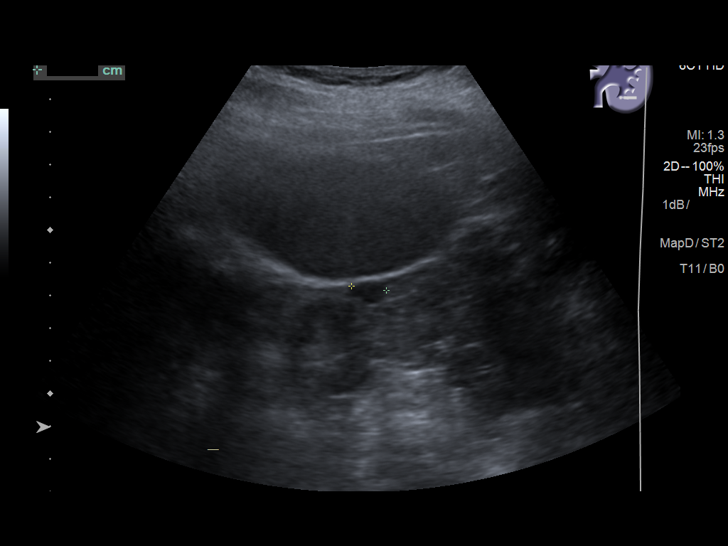
[im 49/59]
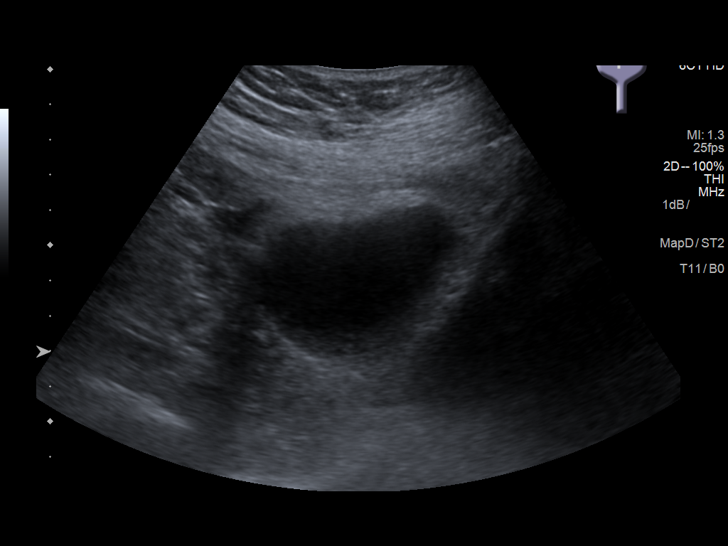
[im 54/59]
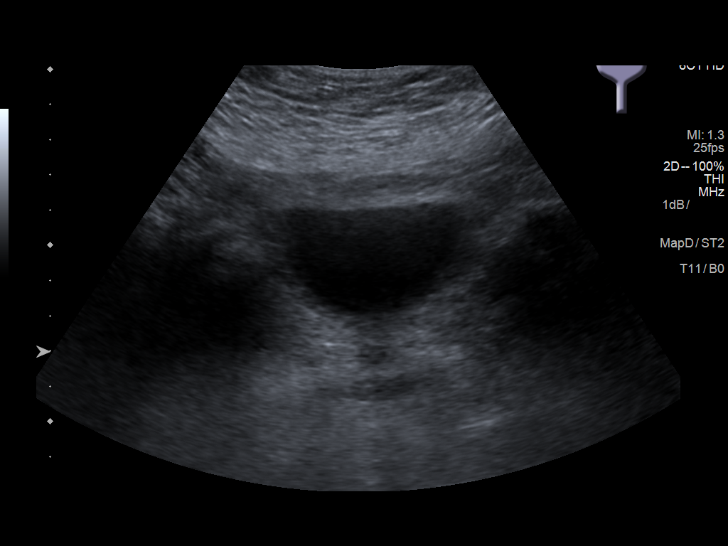
[im 59/59]
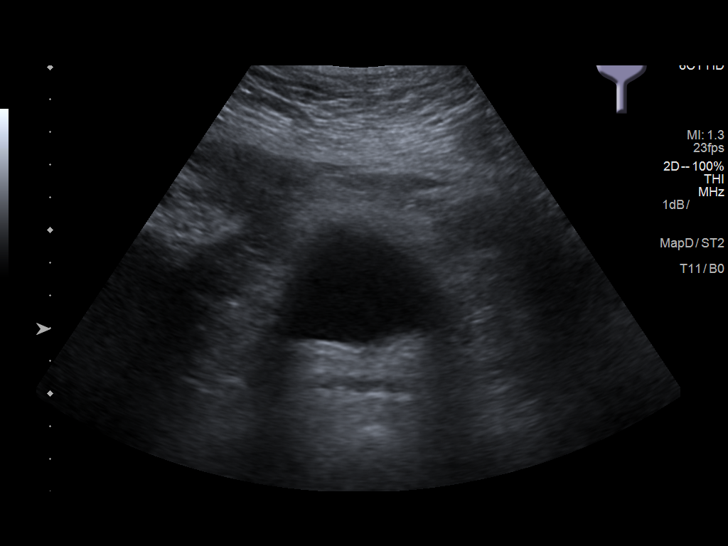

[14 of 25 positions shown; findings below may reference images not displayed]

FINDINGS: Right Kidney:

Length: 8.3 cm. Increased echogenicity within the renal parenchyma
with cortical thinning. No mass or hydronephrosis visualized.

Left Kidney:

Length: 8.4 cm. Increased echogenicity within the renal parenchyma
with cortical thinning. No hydronephrosis. 1.2 x 0.7 x 1.1 cm cyst
present at the lower pole. Additional 1.2 x 1.1 x 1.2 cm anechoic
cystic lesion present within the adjacent lower pole. No significant
internal complexity.

Bladder:

Appears normal for degree of bladder distention.
IMPRESSION: 1. Increased echogenicity within the renal parenchyma with
associated cortical thinning, compatible with chronic medical renal
disease.
2. No hydronephrosis.
3. Small left renal cysts as above.

## 2019-06-11 ENCOUNTER — Other Ambulatory Visit: Payer: Self-pay | Admitting: Cardiovascular Disease

## 2019-06-13 ENCOUNTER — Other Ambulatory Visit: Payer: Self-pay | Admitting: Cardiovascular Disease

## 2019-06-26 ENCOUNTER — Encounter: Payer: Self-pay | Admitting: Pulmonary Disease

## 2019-06-26 ENCOUNTER — Ambulatory Visit (INDEPENDENT_AMBULATORY_CARE_PROVIDER_SITE_OTHER): Payer: Medicare Other | Admitting: Pulmonary Disease

## 2019-06-26 ENCOUNTER — Other Ambulatory Visit: Payer: Self-pay

## 2019-06-26 VITALS — BP 118/62 | HR 100 | Temp 97.7°F | Ht 64.5 in | Wt 146.2 lb

## 2019-06-26 DIAGNOSIS — I340 Nonrheumatic mitral (valve) insufficiency: Secondary | ICD-10-CM

## 2019-06-26 DIAGNOSIS — R0602 Shortness of breath: Secondary | ICD-10-CM | POA: Diagnosis not present

## 2019-06-26 DIAGNOSIS — I272 Pulmonary hypertension, unspecified: Secondary | ICD-10-CM | POA: Diagnosis not present

## 2019-06-26 DIAGNOSIS — J449 Chronic obstructive pulmonary disease, unspecified: Secondary | ICD-10-CM

## 2019-06-26 MED ORDER — BREZTRI AEROSPHERE 160-9-4.8 MCG/ACT IN AERO: 2.0000 | INHALATION_SPRAY | Freq: Two times a day (BID) | RESPIRATORY_TRACT | 0 refills | Status: AC

## 2019-06-26 NOTE — Progress Notes (Signed)
Subjective:    Anita Ferrell ID: Anita Ferrell, female    DOB: 26-Feb-1933, 86 y.o.   MRN: [id number]   Requesting MD/Service: Self Date of initial consultation: 06/20/15 Reason for consultation: COPD, lung nodule  PROBLEMS:  Remote smoker Moderate COPD/emphysema Lung nodule -decreased in size, no further follow-up H/O positive PPD - S/P 9 months INH.   Data: CT chest 07/29/15:Significant decrease in size of the previously demonstrated right lower lobe nodule, compatible with a benign process. No significant change in multiple additional sub cm nodules and calcified granulomata in both lungs, compatible with a benign process. Mild changes of COPD and chronic bronchitis. Resolved infection in the lingula and left lower lobe PFTs 07/29/15: moderate obstruction, normal lung volumes, mild reduction DLCO.  TEE 10/19/16: LVEF 45%. Moderate mitral regurgitation, moderate TR [facility] 10/25/16: Nonobstructive coronary disease. Moderate pulmonary hypertension (systolic 50 mmHg, mean 30 mmHg)  HPI Anita Ferrell is an 86 year old former smoker (quit 1992) previously followed with Dr. Alva Garnet.  I am assuming care as Dr. Alva Garnet has left the practice.  Anita Ferrell states that she has been having difficulties with dyspnea particularly when she walks "long distances".  Long distances can mean over 75 feet.  Prior PFTs performed in 2017 showed moderate obstruction.  Her dyspnea appears to be out of proportion to her actual numbers.  She has issues with chronic cough for which she uses albuterol nebulizer with some relief.  She was treated with Anoro Ellipta and has been compliant with this.  However, she notes that her insurance company has switched coverage and she will need an alternative.  She has noted that Anoro has been most helpful inhaler she has used.  Previously she use Symbicort.  Had any hemoptysis, no purulent sputum production.  No fever, chills or sweats.  She does not describe any chest pain.  No lower  extremity edema or calf tenderness.  No orthopnea or paroxysmal nocturnal dyspnea.   I have reviewed all of her prior laboratory data and in particular reviewed imaging, PFTs and echocardiography.  Review of Systems A 10 point review of systems was performed and it is as noted above otherwise negative.    Objective:   Physical Exam BP 118/62 (BP Location: Left Arm, Cuff Size: Normal)   Pulse 100   Temp 97.7 F (36.5 C) (Temporal)   Ht 5' 4.5" (1.638 m)   Wt 146 lb 3.2 oz (66.3 kg)   SpO2 95%   BMI 24.71 kg/m  GENERAL: Well-nourished female, spry, no acute respiratory distress, fully ambulatory. HEAD: Normocephalic, atraumatic.  EYES: Pupils equal, round, reactive to light.  No scleral icterus.  MOUTH: Nose/mouth/throat not examined due to masking requirements for COVID 19. NECK: Supple. No thyromegaly. No nodules. No JVD.  Trachea midline PULMONARY: Distant breath sounds, coarse, no other adventitious sounds CARDIOVASCULAR: S1 and S2. Regular rate and rhythm.  Grade 2/6 systolic ejection murmur consistent with mitral regurg. GASTROINTESTINAL: Nondistended abdomen.   MUSCULOSKELETAL: No joint deformity, no clubbing, no edema.  NEUROLOGIC: No overt focal deficits speech fluent. SKIN: Intact,warm,dry.  Limited exam no rashes. PSYCH: Mood and behavior normal.  Ambulatory oximetry showed no desaturations with exercise.  Complained of dyspnea during the test.    Assessment & Plan:   COPD Moderate  Shortness of breath Her symptoms are out of proportion to her previously noted pulmonary function She needs new alternative to Anoro Trial of Breztri 2 inhalations twice a day this should be covered by her plan Prior echo showing tricuspid regurg query  pulmonary hypertension Check overnight oximetry vis-a-vis pulmonary hypertension and shortness of breath  Mitral valve regurgitation Will obtain 2D echo as noted above  Pulmonary hypertension 2D echo as noted above Overnight  oximetry to exclude nocturnal hypoxemia  Further issues pending Anita Ferrell's response to the above and pending laboratory data requested.  Renold Don, MD [facility]  *This note was dictated using voice recognition software/Dragon.  Despite best efforts to proofread, errors can occur which can change the meaning.  Any change was purely unintentional.

## 2019-06-26 NOTE — Patient Instructions (Signed)
We are going to give you a trial of Breztri 2 inhalations twice a day, make sure you rinse your mouth well after you use it.  Let us know how you do with the Judithann Sauger so that we can call the prescription into your pharmacy.  You may start the [facility] after you finish your Anoro do not use Anoro and Breztri at the same time.  We are going to check an overnight oxygen level.  Going to check an echocardiogram to check your heart.  We will want to see you back in 2 months time.  Should any new difficulties arise.

## 2019-07-01 ENCOUNTER — Other Ambulatory Visit: Payer: Self-pay | Admitting: Cardiovascular Disease

## 2019-07-05 DIAGNOSIS — J449 Chronic obstructive pulmonary disease, unspecified: Secondary | ICD-10-CM | POA: Diagnosis not present

## 2019-07-12 ENCOUNTER — Telehealth: Payer: Self-pay | Admitting: Internal Medicine

## 2019-07-12 ENCOUNTER — Telehealth: Payer: Self-pay | Admitting: Pulmonary Disease

## 2019-07-12 NOTE — Telephone Encounter (Signed)
Spoke with patient regarding message below. Pt stated that she did speak with someone from pulmonary but was not able to clearly understand why she needs oxygen, etc. Patient stated that she wanted to speak with Dr Nicki Reaper regarding her ONO. Advised that since pulmonary ordered this test, they receive results and determine needs for oxygen, etc. Also advised that if Dr Patsey Berthold is recommending her start on oxygen, she should start since she was referred to pulmonary for further evaluation and treatment determination. Pt understood. Advised that I would follow up with pulmonary.

## 2019-07-12 NOTE — Telephone Encounter (Signed)
I called and spoke with the patient and made her aware that it was Anita Ferrell that was calling her to set up her Oxygen based on her ONO. She states that she did not have anyone speak to her in regards to the results and she does not want to go in oxygen and that's a big deal. I advised her that I was having the results faxed to me here at this office so that I can go over it in detail with her.

## 2019-07-12 NOTE — Telephone Encounter (Signed)
I called and spoke with the patient and gave her full ONO report over the phone and the reason that she needs oxygen at night. She states that she does not really want to go on oxygen and that she was going to speak with her PCP about this before she decides.

## 2019-07-12 NOTE — Telephone Encounter (Signed)
Pt called and would like to speak to Dr. Nicki Reaper on what she should do. She saw Dr. Patsey Berthold and they ordered an oxygen test. She said she didn't hear anything from them about the results and last night someone tried to deliver her oxygen and she just don't know what to do? She said she has tried calling Dr. Domingo Dimes office but she is in surgery and then will be on vacation.

## 2019-07-17 ENCOUNTER — Other Ambulatory Visit: Payer: Self-pay

## 2019-07-17 ENCOUNTER — Ambulatory Visit
Admission: RE | Admit: 2019-07-17 | Discharge: 2019-07-17 | Disposition: A | Discharge status: Home / Self Care | Payer: Medicare Other | Source: Ambulatory Visit | Attending: Pulmonary Disease | Admitting: Pulmonary Disease

## 2019-07-17 DIAGNOSIS — I110 Hypertensive heart disease with heart failure: Secondary | ICD-10-CM | POA: Diagnosis not present

## 2019-07-17 DIAGNOSIS — R0600 Dyspnea, unspecified: Secondary | ICD-10-CM | POA: Insufficient documentation

## 2019-07-17 DIAGNOSIS — R0602 Shortness of breath: Secondary | ICD-10-CM

## 2019-07-17 DIAGNOSIS — I272 Pulmonary hypertension, unspecified: Secondary | ICD-10-CM | POA: Diagnosis not present

## 2019-07-17 DIAGNOSIS — I509 Heart failure, unspecified: Secondary | ICD-10-CM | POA: Insufficient documentation

## 2019-07-17 DIAGNOSIS — J449 Chronic obstructive pulmonary disease, unspecified: Secondary | ICD-10-CM | POA: Diagnosis not present

## 2019-07-17 DIAGNOSIS — I071 Rheumatic tricuspid insufficiency: Secondary | ICD-10-CM | POA: Diagnosis not present

## 2019-07-17 NOTE — Progress Notes (Signed)
*  PRELIMINARY RESULTS* Echocardiogram 2D Echocardiogram has been performed.  Sherrie Sport 07/17/2019, 11:40 AM

## 2019-07-23 ENCOUNTER — Encounter: Payer: Self-pay | Admitting: Pulmonary Disease

## 2019-08-06 ENCOUNTER — Other Ambulatory Visit: Payer: Self-pay | Admitting: Cardiovascular Disease

## 2019-08-17 ENCOUNTER — Other Ambulatory Visit: Payer: Self-pay

## 2019-08-17 ENCOUNTER — Encounter: Payer: Self-pay | Admitting: Pulmonary Disease

## 2019-08-17 ENCOUNTER — Ambulatory Visit (INDEPENDENT_AMBULATORY_CARE_PROVIDER_SITE_OTHER): Payer: Medicare Other | Admitting: Pulmonary Disease

## 2019-08-17 VITALS — BP 112/60 | HR 86 | Temp 98.1°F | Ht 64.5 in | Wt 146.4 lb

## 2019-08-17 DIAGNOSIS — I272 Pulmonary hypertension, unspecified: Secondary | ICD-10-CM | POA: Diagnosis not present

## 2019-08-17 DIAGNOSIS — J449 Chronic obstructive pulmonary disease, unspecified: Secondary | ICD-10-CM

## 2019-08-17 DIAGNOSIS — R0602 Shortness of breath: Secondary | ICD-10-CM | POA: Diagnosis not present

## 2019-08-17 MED ORDER — TRELEGY ELLIPTA 100-62.5-25 MCG/INH IN AEPB: 1.0000 | INHALATION_SPRAY | Freq: Every day | RESPIRATORY_TRACT | 0 refills | Status: DC

## 2019-08-17 MED ORDER — TRELEGY ELLIPTA 100-62.5-25 MCG/INH IN AEPB: 1.0000 | INHALATION_SPRAY | Freq: Every day | RESPIRATORY_TRACT | 6 refills | Status: DC

## 2019-08-17 MED ORDER — ALBUTEROL SULFATE HFA 108 (90 BASE) MCG/ACT IN AERS: 1.0000 | INHALATION_SPRAY | Freq: Four times a day (QID) | RESPIRATORY_TRACT | 5 refills | Status: DC | PRN

## 2019-08-17 NOTE — Progress Notes (Signed)
Subjective:    Patient ID: Anita Ferrell, female    DOB: 11/11/1932, 86 y.o.   MRN: [id number]  Requesting MD/Service:Self Date of initial consultation:06/20/15, by Dr. Merton Border Reason for consultation:COPD, lung nodule  PROBLEMS:  Remote smoker Moderate COPD/emphysema Lung nodule -decreased in size, no further follow-up H/O positive PPD - S/P 9 months INH.   Data: CT chest 07/29/15:Significant decrease in size of the previously demonstrated right lower lobe nodule, compatible with a benign process. No significant change in multiple additional sub cm nodules and calcified granulomata in both lungs, compatible with a benign process. Mild changes of COPD and chronic bronchitis. Resolved infection in the lingula and left lower lobe PFTs 07/29/15: moderate obstruction, normal lung volumes, mild reduction DLCO.  TEE 10/19/16: LVEF 45%. Moderate mitral regurgitation, moderate TR [facility] 10/25/16:Nonobstructive coronary disease. Moderate pulmonary hypertension (systolic 50 mmHg, mean 30 mmHg) 2D echo 07/17/2019: LVEF 55 to 60%, indeterminate diastolic parameters.  Normal pulmonary artery systolic pressure.  Tricuspid regurgitation mild to moderate.  No aortic stenosis.  HPI Patient is an 86 year old former smoker who presents for scheduled visit.  Recall she has moderate COPD.  She has been maintained on Anoro Ellipta.  During her last visit she was given a trial of 2 puffs twice a day however she did not tolerate this medication well.  We also schedule her for a 2D echo results are as above, this time it failed to demonstrate pulmonary hypertension however it is noted she does have tricuspid regurgitation.  This will need to be monitored expectantly.  She is back on Anoro and as needed albuterol.  Notes persistent issues with dyspnea.  Cough productive of yellowish sputum.  No orthopnea, paroxysmal nocturnal dyspnea, fevers, chills or sweats.  No hemoptysis.  No other  complaints.   Review of Systems A 10 point review of systems was performed and it is as noted above otherwise negative.  Patient Active Problem List   Diagnosis Date Noted  . Low back pain 05/20/2020  . Left hip pain 05/20/2020  . Irregular heart rhythm 01/15/2020  . Nocturnal hypoxia 12/18/2019  . Pulmonary HTN ([place]) 12/26/2016  . Moderate mitral regurgitation 10/27/2016  . Bradycardia 09/23/2016  . Ventricular bigeminy 09/23/2016  . Diarrhea 01/13/2016  . CKD (chronic kidney disease) stage 3, GFR 30-59 ml/min (HCC) 10/16/2015  . Shortness of breath 03/11/2015  . COPD with hypoxia ([place]) 03/11/2015  . Environmental allergies 01/07/2015  . Health care maintenance 05/29/2014  . Nephrolithiasis 03/13/2014  . Obesity 03/10/2014  . Splenomegaly 03/05/2014  . Rectal bleeding 03/05/2014  . Hyperglycemia 12/30/2012  . Hemorrhoids 12/30/2012  . Grade I internal hemorrhoids 12/30/2012  . Anemia 08/27/2012  . Hypercholesterolemia 08/25/2012  . Essential hypertension 05/28/2012  . Asthma, chronic 05/28/2012  . GERD (gastroesophageal reflux disease) 05/28/2012  . Barrett's esophagus 05/28/2012   Allergies  Allergen Reactions  . Penicillin G Swelling  . Penicillins Swelling and Other (See Comments)    Has patient had a PCN reaction causing immediate rash, facial/tongue/throat swelling, SOB or lightheadedness with hypotension: Yes Has patient had a PCN reaction causing severe rash involving mucus membranes or skin necrosis: No Has patient had a PCN reaction that required hospitalization No Has patient had a PCN reaction occurring within the last 10 years: No If all of the above answers are "NO", then may proceed with Cephalosporin use.   Current Meds  Medication Sig  . acetaminophen (TYLENOL) 500 MG tablet Take 500 mg by mouth as needed.   Marland Kitchen  aspirin EC 81 MG tablet Take 81 mg by mouth daily.  . Calcium Carbonate-Vitamin D 600-400 MG-UNIT tablet Take 1 tablet by mouth at bedtime.   . ferrous sulfate 325 (65 FE) MG tablet Take 325 mg by mouth 2 (two) times daily with a meal.   . fexofenadine (ALLEGRA) 180 MG tablet Take 180 mg by mouth daily.  . lansoprazole (PREVACID) 30 MG capsule Take 30 mg by mouth daily.  . meclizine (ANTIVERT) 25 MG tablet Take 25 mg by mouth as needed for dizziness.  . Multiple Vitamins-Minerals (CENTRUM SILVER PO) Take by mouth.  . Probiotic Product (ALIGN PO) Take by mouth.  . [DISCONTINUED] albuterol (PROVENTIL) (2.5 MG/3ML) 0.083% nebulizer solution Take 3 mLs (2.5 mg total) by nebulization every 6 (six) hours as needed for wheezing or shortness of breath. DX: R06.02, R06.2  . [DISCONTINUED] azelastine (ASTELIN) 0.1 % nasal spray USE 1 SPRAY EACH NOSTRIL TWO TIMES DAILY  . [DISCONTINUED] furosemide (LASIX) 40 MG tablet TAKE 1 TABLET BY MOUTH EVERY OTHER DAY  . [DISCONTINUED] metoprolol tartrate (LOPRESSOR) 50 MG tablet TAKE 0.5 TABLETS (25 MG TOTAL) BY MOUTH 2 (TWO) TIMES DAILY.  . [DISCONTINUED] rosuvastatin (CRESTOR) 10 MG tablet TAKE 1 TABLET BY MOUTH EVERY DAY  . [DISCONTINUED] spironolactone (ALDACTONE) 25 MG tablet TAKE 1/2 TABLET BY MOUTH EVERY DAY       Objective:   Physical Exam BP 112/60 (BP Location: Right Arm, Cuff Size: Normal)   Pulse 86   Temp 98.1 F (36.7 C) (Oral)   Ht 5' 4.5" (1.638 m)   Wt 146 lb 6.4 oz (66.4 kg)   SpO2 96%   BMI 24.74 kg/m  GENERAL: Well-nourished female, spry, no acute respiratory distress, fully ambulatory. HEAD: Normocephalic, atraumatic.  EYES: Pupils equal, round, reactive to light. No scleral icterus.  MOUTH: Nose/mouth/throat not examined due to masking requirements for COVID 19. NECK: Supple. No thyromegaly. No nodules. No JVD. Trachea midline PULMONARY: Distant breath sounds, coarse, few rhonchi, no other adventitious sounds CARDIOVASCULAR: S1 and S2. Regular rate and rhythm. Grade 2/6 systolic ejection murmur consistent with mitral regurg. GASTROINTESTINAL: Nondistended abdomen.   MUSCULOSKELETAL: No joint deformity, no clubbing, no edema.  NEUROLOGIC: No overt focal deficits speech fluent. SKIN: Intact,warm,dry. Limited exam no rashes. PSYCH: Mood and behavior normal.       Assessment & Plan:     ICD-10-CM   1. COPD, moderate ([place])  J44.9    Switch Anoro to Trelegy Ellipta 100/62.5/25 Albuterol refill Follow-up in 4 months time  2. Shortness of breath  R06.02    No evidence of pulmonary hypertension on 2D echo Continue to monitor Switch to Trelegy as above  3. Pulmonary HTN (HCC)  I27.20    Most recent 2D echo does not show evidence of pulmonary hypertension Continue to monitor   Meds ordered this encounter  Medications  . Fluticasone-Umeclidin-Vilant (TRELEGY ELLIPTA) 100-62.5-25 MCG/INH AEPB    Sig: Inhale 1 puff into the lungs daily.    Dispense:  28 each    Refill:  6  . Fluticasone-Umeclidin-Vilant (TRELEGY ELLIPTA) 100-62.5-25 MCG/INH AEPB    Sig: Inhale 1 puff into the lungs daily.    Dispense:  28 each    Refill:  0    Order Specific Question:   Lot Number?    Answer:   [id number]    Order Specific Question:   Expiration Date?    Answer:   10/17/2020    Order Specific Question:   Manufacturer?    Answer:  [employer] [[id number]]    Order Specific Question:   Quantity    Answer:   1  . albuterol (VENTOLIN HFA) 108 (90 Base) MCG/ACT inhaler    Sig: Inhale 1-2 puffs into the lungs every 6 (six) hours as needed for wheezing or shortness of breath.    Dispense:  18 g    Refill:  5   We will give her a trial of Trelegy Ellipta, follow-up in 4 months time contact us prior to that time should any new problems arise.   Renold Don, MD [facility]   *This note was dictated using voice recognition software/Dragon.  Despite best efforts to proofread, errors can occur which can change the meaning.  Any change was purely unintentional.

## 2019-08-17 NOTE — Patient Instructions (Signed)
We will switch you to Trelegy Ellipta which is basically clear likely Anoro with a little anti-inflammatory added.  We have refilled your emergency inhaler.   We will see you in follow-up in 4 months time call sooner should any new difficulties arise.

## 2019-08-20 ENCOUNTER — Other Ambulatory Visit: Payer: Self-pay

## 2019-09-30 NOTE — Progress Notes (Signed)
Date:  10/01/2019   ID:  Anita Ferrell, DOB December 25, 1932, MRN [id number]  Patient Location:  [place] [address]   Provider location:   [facility], [place] office  PCP:  [name], MD  Cardiologist:  Arvid Right [facility]   Chief Complaint  Patient presents with  . other    6 month follow up. Meds reviewed by the pt. verbally. Pt. c/o shortness of breath and coughing.     History of Present Illness:    [name] is a 86 y.o. female past medical history of Pulmonary hypertension HTN,  COPD, smoker 86 yo quit 56 Moderate Mitral Regurg, moderate tricuspid valve regurgitation  chronic shortness of breath with exertion.  2015  PFT reports of an FEV1 of 1.1 L.  Nonobstructive coronary disease by catheterization July 2018 Moderately elevated right heart pressures with normal PCWP of 8, by right heart catheterization , on Lasix every other day Echo in 08/2015: EF 35% Mildly reduced LV function, LVEF approximately 45% by TEE at [facility] Ejection fraction 50% in March 2021, right heart pressures estimated to be normal Who presents for mitral valve regurgitation, shortness of breath, pulm HTN  Last seen by telemetry visit December 2020 Was seen in the emergency room February 2020 with shortness of breath Chronic shortness of breath from COPD pulmonary hypertension, takes Lasix every other day  Denies leg edema Followed by pulmonary Started on Trelegy Ellipta  oxygen at night Feels better  Echocardiogram March 2021 Ejection fraction 50% in March 2021, right heart pressures estimated to be normal No significant mitral valve regurgitation  Lab work reviewed total cholesterol 117 LDL 53 Hemoglobin A1c 6.0  avg weight previously estimated at 142  To 143 pounds   Takes extra lasix for weight gain, rare  Goal weight <145  EKG personally reviewed by myself on todays visit Shows normal sinus rhythm with rate 91 bpm, PVCs, no  significant ST-T wave changes  RHC, at [facility] in 2018,  1. Cardiac index 2.2 L/m/m2 at rest with PCWP 8 mmHg. 2. Moderate pulmonary hypertension in the absence of elevated PCWP, PVR 6.0 Wu. 3. Non obstructive CAD medical therapy of moderate MR and severe COPD    Prior CV studies:   The following studies were reviewed today:  Right Ventricular systolic pressure at 60 mm Hg by echo on july of 2011; Right heart cardic catherization revealed pulmonary artery pressusre of 27/10 with a mean of; Ventricular pressure 29/10 with pulmonary capillary wedge at 9  Records from [place] reviewed with her in detail on today's visit Cath 10/2016 1.Cardiac index 2.2 L/m/m2 at rest with PCWP 8 mmHg. 2.Moderate pulmonary hypertension in the absence of elevated PCWP, PVR 6.0 Wu. 3.Non obstructive CAD Plan medical therapy of moderate MR and severe COPD   Previously followed by Dr Aline Brochure at [facility].  ECHO - mild to moderate left ventricular dysfunction with moderate to severe MR and moderate TR.   TEE: 1. Moderate Functional MR (EROA by PISA = 0.1) 2. Moderate TR 3. Mild PR 4. No interatrial septal communication 5. Mildly reduced LV function, LVEF approximately 45%  Lab work reviewed with creatinine 1.4   Past Medical History:  Diagnosis Date  . A-fib ([place])   . Allergy   . Asthma   . CHF (congestive heart failure) ([place])   . COPD (chronic obstructive pulmonary disease) ([place])   . GERD (gastroesophageal reflux disease)   . Hypercholesteremia   .  Hypertension   . Mitral valve insufficiency   . Pulmonary hypertension (HCC)    Right Ventricular systolic pressure at 60 mm Hg by echo on july of 2011; Right heart cardic catherization revealed pulmonary artery pressusre of 27/10 with a mean of; Ventricular pressure 29/10 with pulmonary capillary wedge at 9  . TB (pulmonary tuberculosis)    treated with INH therapy    Past Surgical History:  Procedure Laterality Date  . CARDIAC  CATHETERIZATION  10/2016   [facility]  . CATARACT EXTRACTION Bilateral 2008  . CHOLECYSTECTOMY    . TONSILLECTOMY  1964     Current Meds  Medication Sig  . acetaminophen (TYLENOL) 500 MG tablet Take 500 mg by mouth as needed.   Marland Kitchen albuterol (PROVENTIL) (2.5 MG/3ML) 0.083% nebulizer solution Take 3 mLs (2.5 mg total) by nebulization every 6 (six) hours as needed for wheezing or shortness of breath. DX: R06.02, R06.2  . albuterol (VENTOLIN HFA) 108 (90 Base) MCG/ACT inhaler Inhale 1-2 puffs into the lungs every 6 (six) hours as needed for wheezing or shortness of breath.  Marland Kitchen aspirin EC 81 MG tablet Take 81 mg by mouth daily.   Marland Kitchen azelastine (ASTELIN) 0.1 % nasal spray USE 1 SPRAY EACH NOSTRIL TWO TIMES DAILY  . Calcium Carbonate-Vitamin D (CALCIUM 600+D) 600-400 MG-UNIT tablet Take 1 tablet by mouth at bedtime.  . ferrous sulfate 325 (65 FE) MG tablet Take 325 mg by mouth 2 (two) times daily with a meal.   . fexofenadine (ALLEGRA) 180 MG tablet Take 180 mg by mouth daily.   . Fluticasone-Umeclidin-Vilant (TRELEGY ELLIPTA) 100-62.5-25 MCG/INH AEPB Inhale 1 puff into the lungs daily.  . furosemide (LASIX) 40 MG tablet TAKE 1 TABLET BY MOUTH EVERY OTHER DAY  . lansoprazole (PREVACID) 30 MG capsule Take 30 mg by mouth daily.   . meclizine (ANTIVERT) 25 MG tablet Take 25 mg by mouth as needed for dizziness.  . metoprolol tartrate (LOPRESSOR) 50 MG tablet TAKE 0.5 TABLETS (25 MG TOTAL) BY MOUTH 2 (TWO) TIMES DAILY.  . Multiple Vitamins-Minerals (CENTRUM SILVER PO) Take by mouth.  . OXYGEN Inhale 2 L into the lungs at bedtime.  . Probiotic Product (ALIGN PO) Take by mouth.  . rosuvastatin (CRESTOR) 10 MG tablet TAKE 1 TABLET BY MOUTH EVERY DAY  . spironolactone (ALDACTONE) 25 MG tablet TAKE 1/2 TABLET BY MOUTH EVERY DAY     Allergies:   Penicillin g and Penicillins   Social History   Tobacco Use  . Smoking status: Former Smoker    Packs/day: 1.00    Years: 40.00    Pack years: 40.00    Types:  Cigarettes    Quit date: 08/17/1990    Years since quitting: 29.1  . Smokeless tobacco: Never Used  . Tobacco comment: Quit in 1992  Vaping Use  . Vaping Use: Never used  Substance Use Topics  . Alcohol use: No    Alcohol/week: 0.0 standard drinks  . Drug use: No     Family Hx: The patient's family history includes Breast cancer in her maternal aunt; Heart disease in her brother, father, and mother; Hypertension in her sister.  ROS:   Please see the history of present illness.    Review of Systems  Constitutional: Negative.   HENT: Negative.   Respiratory: Positive for shortness of breath.   Cardiovascular: Negative.   Gastrointestinal: Negative.   Musculoskeletal: Negative.   Neurological: Negative.   Psychiatric/Behavioral: Negative.   All other systems reviewed and are negative.  Labs/Other Tests and Data Reviewed:    Recent Labs: 10/06/2018: ALT 13; BUN 15; Creatinine, Ser 1.43; Potassium 4.3; Sodium 141   Recent Lipid Panel Lab Results  Component Value Date/Time   CHOL 117 10/06/2018 08:11 AM   TRIG 114.0 10/06/2018 08:11 AM   HDL 41.60 10/06/2018 08:11 AM   CHOLHDL 3 10/06/2018 08:11 AM   LDLCALC 53 10/06/2018 08:11 AM   LDLDIRECT 127.1 08/25/2012 11:03 AM    Wt Readings from Last 3 Encounters:  10/01/19 147 lb 2 oz (66.7 kg)  08/17/19 146 lb 6.4 oz (66.4 kg)  06/26/19 146 lb 3.2 oz (66.3 kg)     Exam:    Vital Signs: Vital signs may also be detailed in the HPI BP 122/60 (BP Location: Left Arm, Patient Position: Sitting, Cuff Size: Normal)   Pulse 91   Ht 5' 4.5" (1.638 m)   Wt 147 lb 2 oz (66.7 kg)   SpO2 96%   BMI 24.86 kg/m   Constitutional:  oriented to person, place, and time. No distress.  HENT:  Head: Grossly normal Eyes:  no discharge. No scleral icterus.  Neck: No JVD, no carotid bruits  Cardiovascular: Regular rate and rhythm, no murmurs appreciated Pulmonary/Chest: Clear to auscultation bilaterally, no wheezes or rails Abdominal:  Soft.  no distension.  no tenderness.  Musculoskeletal: Normal range of motion Neurological:  normal muscle tone. Coordination normal. No atrophy Skin: Skin warm and dry Psychiatric: normal affect, pleasant   ASSESSMENT & PLAN:    Pulmonary HTN (HCC) Weight is stable, continue Lasix every other day Breathing stable  COPD with hypoxia (HCC) Uses nebulizer, inhalers, stable  no recent COPD exacerbation Last er visit 05/2018 Managed by pulmonary, on new inhalers, feels better, oxygen at nighttime  Moderate mitral regurgitation Moderate MR previously seen on echocardiogram This has resolved on recent echocardiogram March 2021  CKD (chronic kidney disease) stage 3, GFR 30-59 ml/min (HCC) No changes made to her medications Stable renal function  Essential hypertension Blood pressure is well controlled on today's visit. No changes made to the medications.  Hypercholesterolemia Excellent numbers, discussed with her, no changes    Total encounter time more than 25 minutes  Greater than 50% was spent in counseling and coordination of care with the patient   Disposition: Follow-up in 12 months   Signed, Ida Rogue, MD  10/01/2019 11:09 AM    [place] [facility] [address], [place], [place] [address]

## 2019-10-01 ENCOUNTER — Ambulatory Visit (INDEPENDENT_AMBULATORY_CARE_PROVIDER_SITE_OTHER): Payer: Medicare Other | Admitting: Cardiovascular Disease

## 2019-10-01 ENCOUNTER — Encounter: Payer: Self-pay | Admitting: Cardiovascular Disease

## 2019-10-01 ENCOUNTER — Other Ambulatory Visit: Payer: Self-pay

## 2019-10-01 VITALS — BP 122/60 | HR 91 | Ht 64.5 in | Wt 147.1 lb

## 2019-10-01 DIAGNOSIS — J449 Chronic obstructive pulmonary disease, unspecified: Secondary | ICD-10-CM

## 2019-10-01 DIAGNOSIS — N183 Chronic kidney disease, stage 3 unspecified: Secondary | ICD-10-CM

## 2019-10-01 DIAGNOSIS — E7800 Pure hypercholesterolemia, unspecified: Secondary | ICD-10-CM

## 2019-10-01 DIAGNOSIS — I10 Essential (primary) hypertension: Secondary | ICD-10-CM | POA: Diagnosis not present

## 2019-10-01 DIAGNOSIS — R0602 Shortness of breath: Secondary | ICD-10-CM | POA: Diagnosis not present

## 2019-10-01 DIAGNOSIS — R0902 Hypoxemia: Secondary | ICD-10-CM

## 2019-10-01 DIAGNOSIS — I272 Pulmonary hypertension, unspecified: Secondary | ICD-10-CM

## 2019-10-01 NOTE — Patient Instructions (Signed)

## 2019-10-02 ENCOUNTER — Other Ambulatory Visit: Payer: Self-pay | Admitting: Cardiovascular Disease

## 2019-10-17 ENCOUNTER — Other Ambulatory Visit: Payer: Self-pay | Admitting: Internal Medicine

## 2019-10-17 ENCOUNTER — Encounter: Payer: Self-pay | Admitting: *Deleted

## 2019-10-22 IMAGING — MG MM DIGITAL SCREENING BILAT W/ TOMO W/ CAD
6 of 10 series · 6 of 30 positions shown · non-contrast
Comparison: Previous exam(s).

ACR Breast Density Category a: The breast tissue is almost entirely
fatty.

CLINICAL DATA: Screening.

EXAM:
DIGITAL SCREENING BILATERAL MAMMOGRAM WITH TOMO AND CAD

[R MLO synth-2D (1 of 2)]
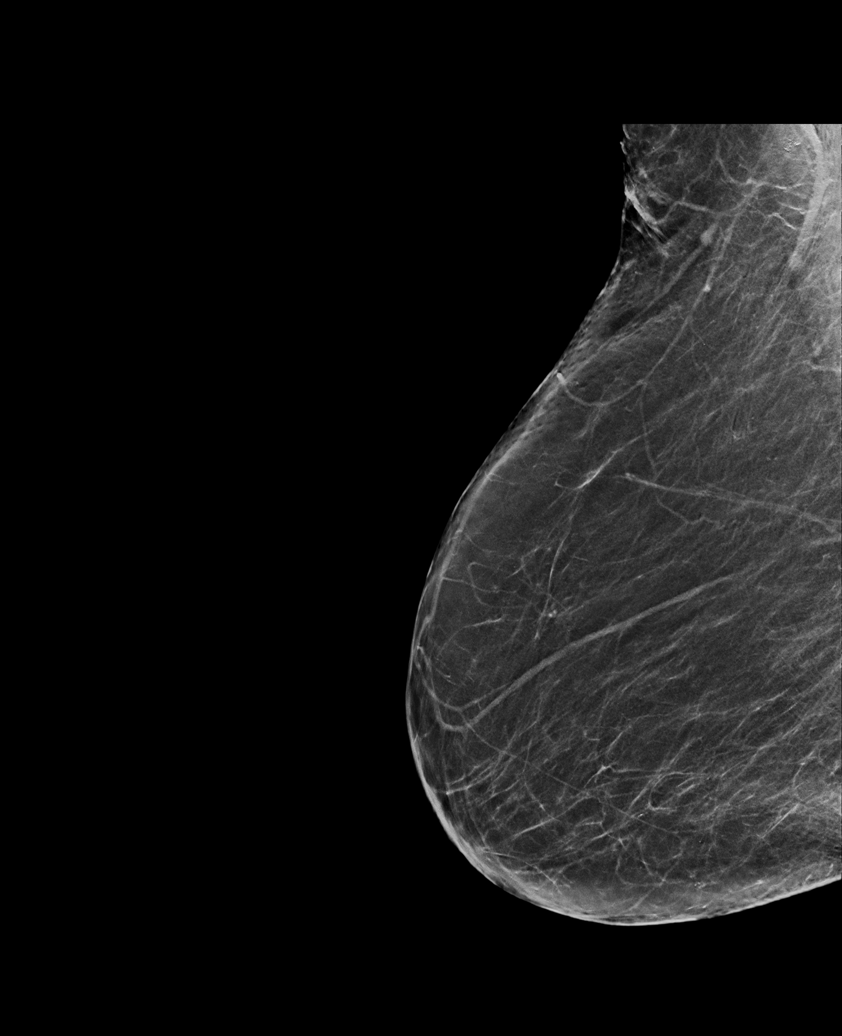

[L MLO synth-2D]
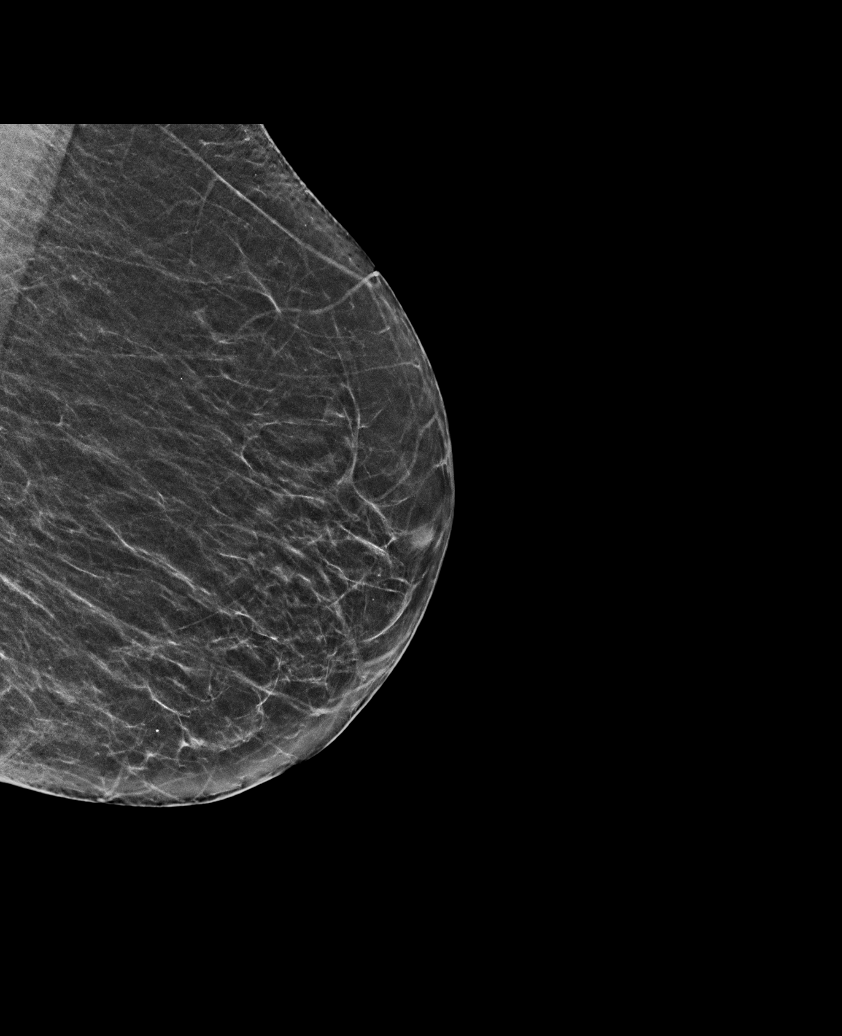

[R MLO synth-2D (2 of 2)]
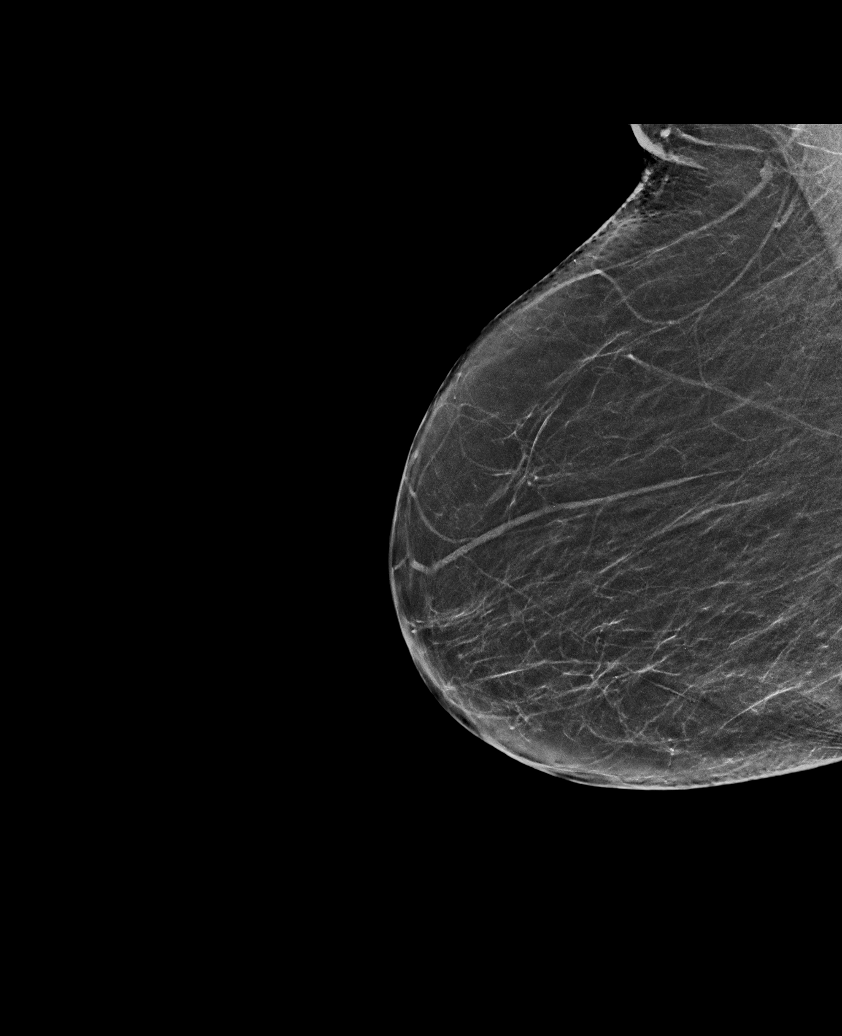

[R CC synth-2D]
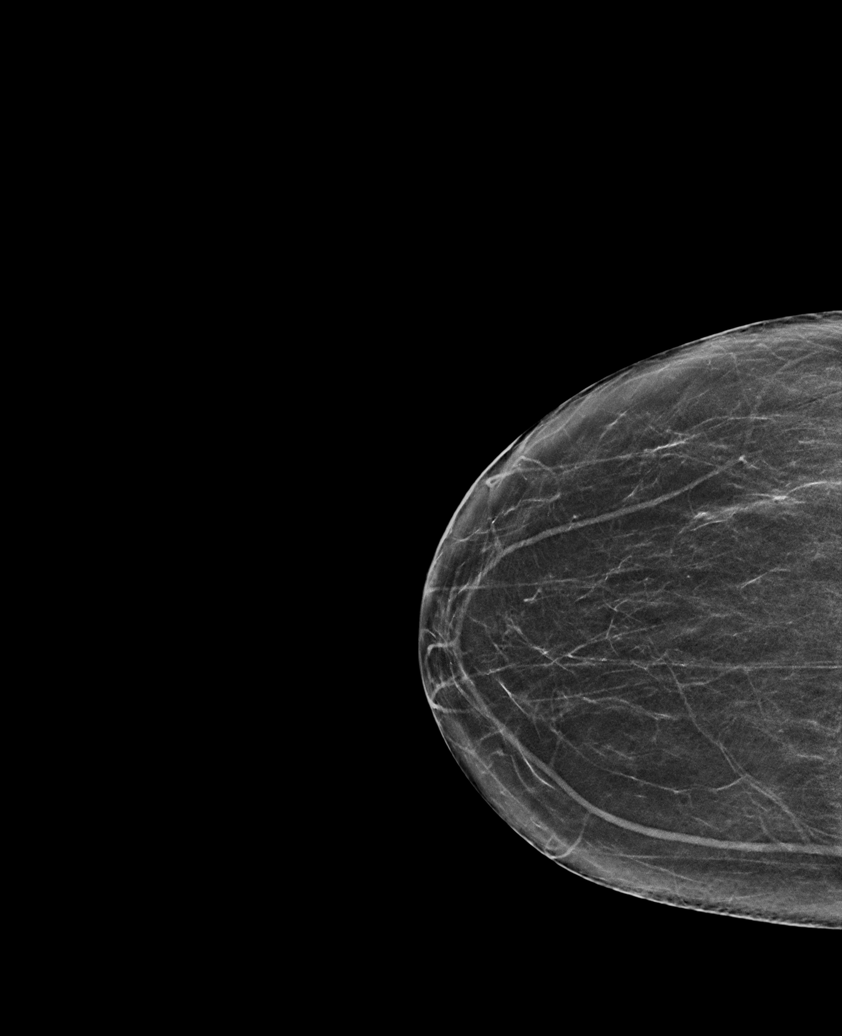

[L CC synth-2D]
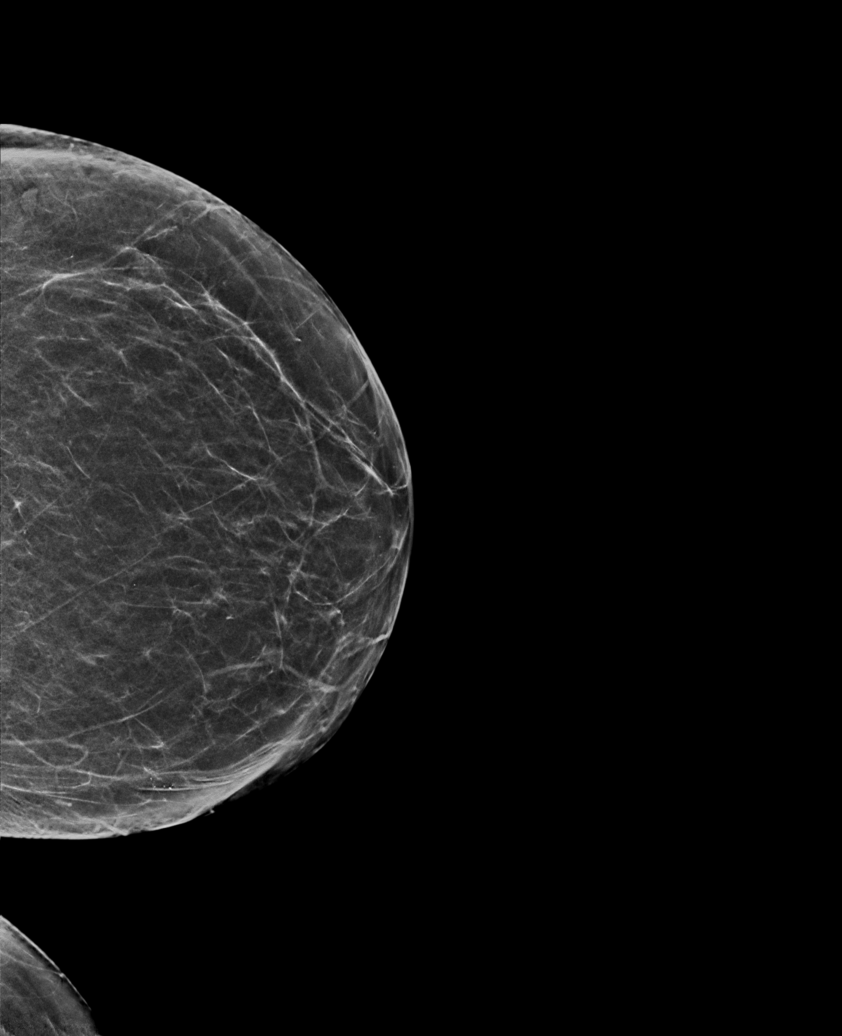

[R CC tomo · tomo slice 28/55.0]
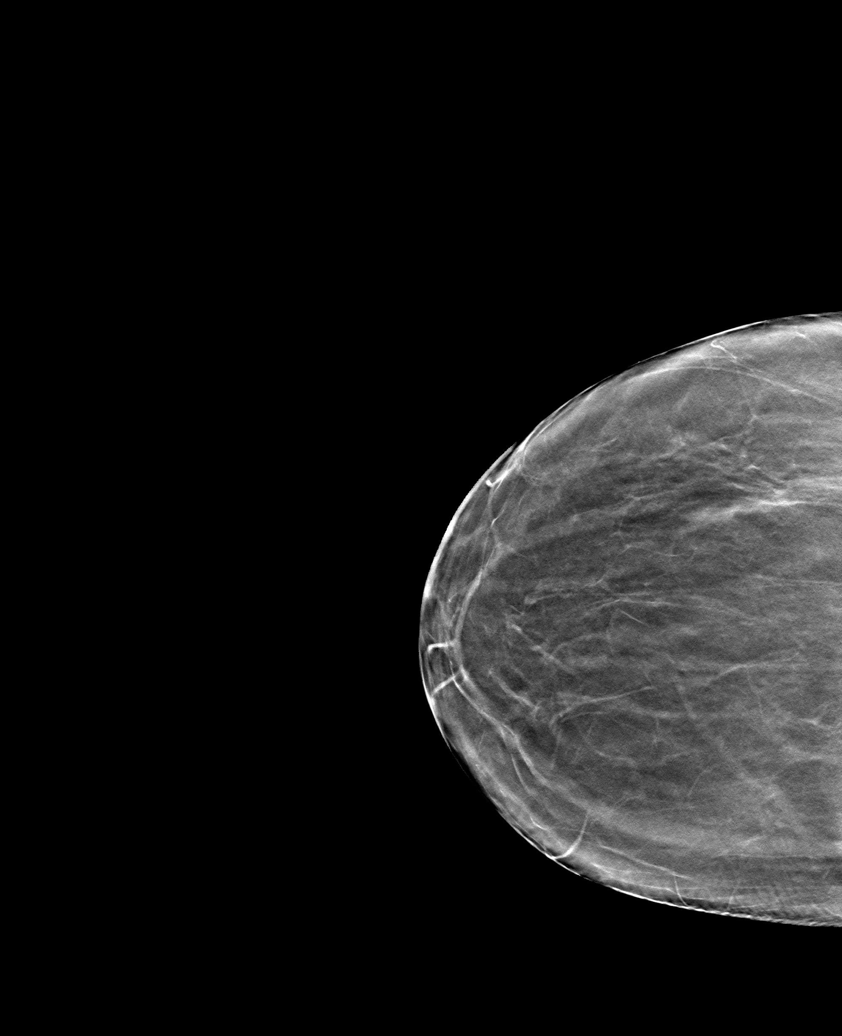

[6 of 30 positions shown; findings below may reference images not displayed]

FINDINGS: There are no findings suspicious for malignancy. Images were
processed with CAD.
IMPRESSION: No mammographic evidence of malignancy. A result letter of this
screening mammogram will be mailed directly to the patient.

RECOMMENDATION:
Screening mammogram in one year. (Code:[id number])

BI-RADS CATEGORY  1: Negative.

## 2019-11-08 ENCOUNTER — Other Ambulatory Visit: Payer: Self-pay | Admitting: Internal Medicine

## 2019-11-20 ENCOUNTER — Other Ambulatory Visit: Payer: Self-pay

## 2019-11-20 MED ORDER — ALBUTEROL SULFATE (2.5 MG/3ML) 0.083% IN NEBU: 2.5000 mg | INHALATION_SOLUTION | Freq: Four times a day (QID) | RESPIRATORY_TRACT | 5 refills | Status: DC | PRN

## 2019-11-20 NOTE — Telephone Encounter (Signed)
Rx for albuterol neb solution has been sent to [facility], as requested by pharmacy.

## 2019-12-04 ENCOUNTER — Other Ambulatory Visit: Payer: Self-pay | Admitting: Internal Medicine

## 2019-12-15 ENCOUNTER — Other Ambulatory Visit: Payer: Self-pay | Admitting: Cardiovascular Disease

## 2019-12-18 ENCOUNTER — Ambulatory Visit (INDEPENDENT_AMBULATORY_CARE_PROVIDER_SITE_OTHER): Payer: Medicare Other | Admitting: Primary Care

## 2019-12-18 ENCOUNTER — Other Ambulatory Visit: Payer: Self-pay

## 2019-12-18 ENCOUNTER — Encounter: Payer: Self-pay | Admitting: Primary Care

## 2019-12-18 DIAGNOSIS — R0902 Hypoxemia: Secondary | ICD-10-CM | POA: Diagnosis not present

## 2019-12-18 DIAGNOSIS — J449 Chronic obstructive pulmonary disease, unspecified: Secondary | ICD-10-CM

## 2019-12-18 DIAGNOSIS — G4734 Idiopathic sleep related nonobstructive alveolar hypoventilation: Secondary | ICD-10-CM

## 2019-12-18 MED ORDER — ALBUTEROL SULFATE (2.5 MG/3ML) 0.083% IN NEBU: 2.5000 mg | INHALATION_SOLUTION | Freq: Four times a day (QID) | RESPIRATORY_TRACT | 5 refills | Status: DC | PRN

## 2019-12-18 NOTE — Patient Instructions (Signed)
Nice seeing you today Anita Ferrell  Recommendations: - Continue Trelegy one puff daily in the morning (rinse mouth after use) - Continue Albuterol (poventil) nebulizer twice daily - Make sure to get flu vaccine in September or October  Follow-up: 4-6 months with Dr. Patsey Berthold     COPD and Physical Activity Chronic obstructive pulmonary disease (COPD) is a long-term (chronic) condition that affects the lungs. COPD is a general term that can be used to describe many different lung problems that cause lung swelling (inflammation) and limit airflow, including chronic bronchitis and emphysema. The main symptom of COPD is shortness of breath, which makes it harder to do even simple tasks. This can also make it harder to exercise and be active. Talk with your health care provider about treatments to help you breathe better and actions you can take to prevent breathing problems during physical activity. What are the benefits of exercising with COPD? Exercising regularly is an important part of a healthy lifestyle. You can still exercise and do physical activities even though you have COPD. Exercise and physical activity improve your shortness of breath by increasing blood flow (circulation). This causes your heart to pump more oxygen through your body. Moderate exercise can improve your:  Oxygen use.  Energy level.  Shortness of breath.  Strength in your breathing muscles.  Heart health.  Sleep.  Self-esteem and feelings of self-worth.  Depression, stress, and anxiety levels. Exercise can benefit everyone with COPD. The severity of your disease may affect how hard you can exercise, especially at first, but everyone can benefit. Talk with your health care provider about how much exercise is safe for you, and which activities and exercises are safe for you. What actions can I take to prevent breathing problems during physical activity?  Sign up for a pulmonary rehabilitation program. This type  of program may include: ? Education about lung diseases. ? Exercise classes that teach you how to exercise and be more active while improving your breathing. This usually involves:  Exercise using your lower extremities, such as a stationary bicycle.  About 30 minutes of exercise, 2 to 5 times per week, for 6 to 12 weeks  Strength training, such as push ups or leg lifts. ? Nutrition education. ? Group classes in which you can talk with others who also have COPD and learn ways to manage stress.  If you use an oxygen tank, you should use it while you exercise. Work with your health care provider to adjust your oxygen for your physical activity. Your resting flow rate is different from your flow rate during physical activity.  While you are exercising: ? Take slow breaths. ? Pace yourself and do not try to go too fast. ? Purse your lips while breathing out. Pursing your lips is similar to a kissing or whistling position. ? If doing exercise that uses a quick burst of effort, such as weight lifting:  Breathe in before starting the exercise.  Breathe out during the hardest part of the exercise (such as raising the weights). Where to find support You can find support for exercising with COPD from:  Your health care provider.  A pulmonary rehabilitation program.  Your local health department or community health programs.  Support groups, online or in-person. Your health care provider may be able to recommend support groups. Where to find more information You can find more information about exercising with COPD from:  American Lung Association: ClassInsider.se.  COPD Foundation: https://www.rivera.net/. Contact a health care provider if:  Your symptoms get worse.  You have chest pain.  You have nausea.  You have a fever.  You have trouble talking or catching your breath.  You want to start a new exercise program or a new activity. Summary  COPD is a general term that can be used to  describe many different lung problems that cause lung swelling (inflammation) and limit airflow. This includes chronic bronchitis and emphysema.  Exercise and physical activity improve your shortness of breath by increasing blood flow (circulation). This causes your heart to provide more oxygen to your body.  Contact your health care provider before starting any exercise program or new activity. Ask your health care provider what exercises and activities are safe for you. This information is not intended to replace advice given to you by your health care provider. Make sure you discuss any questions you have with your health care provider. Document Revised: 07/26/2018 Document Reviewed: 04/28/2017 Elsevier Patient Education  2020 [employer].

## 2019-12-18 NOTE — Progress Notes (Signed)
@Patient  ID: Anita Ferrell, female    DOB: Jun 26, 1932, 86 y.o.   MRN: [id number]  Chief Complaint  Patient presents with  . Follow-up    Breathing is unchanged since the last visit. She is using her neb 2 x per day and rarely uses albuterol inhaler.     Referring provider: [name], MD  HPI: 86 year old female, former smoker quit 1992 (40-pack-year history).  Past medical history significant for moderate COPD, CHF, pulmonary HTN, mitral valve regurgitation.  Patient of Dr. Patsey Berthold, last seen 08/17/2019.  During this visit Anoro was changed to Trelegy Ellipta.  12/18/2019 Patient presents today for 42-month follow-up COPD. She is doing well, breathing is stable. She continues Trelegy Ellipta, feel this has improved her symptoms. She is using Proventil nebulizer twice a day. She continue 2L oxygen at night. She has a chronic cough, this has been ongoing for 40+ years. Cough is occasionally productive with clear mucus.  Husband states that Trelegy has really helped her cough. She is still able to do most all ADLs, shortness of breath does slows her down. She went to cardiologist and they were happy with most recent echocardiogram results. No recent exacerbations or hospitalizations. Her DME company for oxygen is [employer].   Cardiac testing: 07/17/19 Echocardiogram - EF 55-60%, no evidence of mitral valve regurgitation, mild-moderate tricuspid valve regurgitation, normal PA systolic pressure  Pulmonary testing: 07/29/15 - FVC 1.94 (74%), FEV1 1.09 (57%), ratio 56, DLCOunc 14.73 (60%), NO BD response  Allergies  Allergen Reactions  . Penicillin G Swelling  . Penicillins Swelling and Other (See Comments)    Has patient had a PCN reaction causing immediate rash, facial/tongue/throat swelling, SOB or lightheadedness with hypotension: Yes Has patient had a PCN reaction causing severe rash involving mucus membranes or skin necrosis: No Has patient had a PCN reaction that required  hospitalization No Has patient had a PCN reaction occurring within the last 10 years: No If all of the above answers are "NO", then may proceed with Cephalosporin use.    Immunization History  Administered Date(s) Administered  . Fluad Quad(high Dose 65+) 12/15/2018  . Influenza, High Dose Seasonal PF 01/13/2016, 01/25/2017, 01/10/2018  . Influenza,inj,Quad PF,6+ Mos 12/26/2012, 01/12/2014, 01/14/2015  . Moderna SARS-COVID-2 Vaccination 05/02/2019, 05/30/2019  . Pneumococcal Conjugate-13 09/02/2014  . Pneumococcal Polysaccharide-23 11/14/2012  . Tdap 11/14/2012    Past Medical History:  Diagnosis Date  . A-fib ([place])   . Allergy   . Asthma   . CHF (congestive heart failure) ([place])   . COPD (chronic obstructive pulmonary disease) ([place])   . GERD (gastroesophageal reflux disease)   . Hypercholesteremia   . Hypertension   . Mitral valve insufficiency   . Pulmonary hypertension ([facility])    Right Ventricular systolic pressure at 60 mm Hg by echo on july of 2011; Right heart cardic catherization revealed pulmonary artery pressusre of 27/10 with a mean of; Ventricular pressure 29/10 with pulmonary capillary wedge at 9  . TB (pulmonary tuberculosis)    treated with INH therapy     Tobacco History: Social History   Tobacco Use  Smoking Status Former Smoker  . Packs/day: 1.00  . Years: 40.00  . Pack years: 40.00  . Types: Cigarettes  . Quit date: 08/17/1990  . Years since quitting: 29.3  Smokeless Tobacco Never Used  Tobacco Comment   Quit in 1992   Counseling given: Not Answered Comment: Quit in 1992   Outpatient Medications Prior to Visit  Medication Sig Dispense Refill  .  acetaminophen (TYLENOL) 500 MG tablet Take 500 mg by mouth as needed.     Marland Kitchen albuterol (VENTOLIN HFA) 108 (90 Base) MCG/ACT inhaler Inhale 1-2 puffs into the lungs every 6 (six) hours as needed for wheezing or shortness of breath. 18 g 5  . aspirin EC 81 MG tablet Take 81 mg by mouth daily.     Marland Kitchen azelastine  (ASTELIN) 0.1 % nasal spray USE 1 SPRAY EACH NOSTRIL TWO TIMES DAILY 30 mL 3  . Calcium Carbonate-Vitamin D (CALCIUM 600+D) 600-400 MG-UNIT tablet Take 1 tablet by mouth at bedtime.    . ferrous sulfate 325 (65 FE) MG tablet Take 325 mg by mouth 2 (two) times daily with a meal.     . fexofenadine (ALLEGRA) 180 MG tablet Take 180 mg by mouth daily.     . Fluticasone-Umeclidin-Vilant (TRELEGY ELLIPTA) 100-62.5-25 MCG/INH AEPB Inhale 1 puff into the lungs daily. 28 each 6  . furosemide (LASIX) 40 MG tablet TAKE 1 TABLET BY MOUTH EVERY OTHER DAY 45 tablet 2  . lansoprazole (PREVACID) 30 MG capsule Take 30 mg by mouth daily.     . meclizine (ANTIVERT) 25 MG tablet Take 25 mg by mouth as needed for dizziness.    . metoprolol tartrate (LOPRESSOR) 50 MG tablet TAKE 0.5 TABLETS (25 MG TOTAL) BY MOUTH 2 (TWO) TIMES DAILY. 90 tablet 2  . Multiple Vitamins-Minerals (CENTRUM SILVER PO) Take by mouth.    . OXYGEN Inhale 2 L into the lungs at bedtime.    . Probiotic Product (ALIGN PO) Take by mouth.    . rosuvastatin (CRESTOR) 10 MG tablet TAKE 1 TABLET (10 MG TOTAL) BY MOUTH DAILY. NEEDS APPT FOR FURTHER REFILL. SEE MYCHART MESSAGE 30 tablet 0  . spironolactone (ALDACTONE) 25 MG tablet TAKE 1/2 TABLET BY MOUTH EVERY DAY 30 tablet 1  . albuterol (PROVENTIL) (2.5 MG/3ML) 0.083% nebulizer solution Take 3 mLs (2.5 mg total) by nebulization every 6 (six) hours as needed for wheezing or shortness of breath. DX: R06.02, R06.2 360 mL 5   No facility-administered medications prior to visit.    Review of Systems  Review of Systems  Constitutional: Negative.   Respiratory: Negative.   Cardiovascular: Negative.    Physical Exam  BP 106/70 (BP Location: Left Arm, Cuff Size: Normal)   Pulse 100   Temp (!) 97.5 F (36.4 C) (Temporal)   Ht 5' 4.5" (1.638 m)   Wt 147 lb 9.6 oz (67 kg)   SpO2 97% Comment: on RA  BMI 24.94 kg/m  Physical Exam Constitutional:      Appearance: Normal appearance.  HENT:      Head: Normocephalic and atraumatic.     Mouth/Throat:     Mouth: Mucous membranes are moist.     Pharynx: Oropharynx is clear.  Cardiovascular:     Rate and Rhythm: Normal rate and regular rhythm.  Pulmonary:     Effort: Pulmonary effort is normal.     Breath sounds: Normal breath sounds. No wheezing.  Skin:    General: Skin is warm and dry.  Neurological:     General: No focal deficit present.     Mental Status: She is alert and oriented to person, place, and time. Mental status is at baseline.  Psychiatric:        Mood and Affect: Mood normal.        Behavior: Behavior normal.        Thought Content: Thought content normal.  Judgment: Judgment normal.      Lab Results:  CBC    Component Value Date/Time   WBC 2.8 (L) 05/27/2018 0407   RBC 3.91 05/27/2018 0407   HGB 10.6 (L) 05/27/2018 0407   HGB 12.1 12/08/2011 1040   HCT 33.6 (L) 05/27/2018 0407   HCT 38.3 12/08/2011 1040   PLT 152 05/27/2018 0407   PLT 192 12/08/2011 1040   MCV 85.9 05/27/2018 0407   MCV 85 12/08/2011 1040   MCH 27.1 05/27/2018 0407   MCHC 31.5 05/27/2018 0407   RDW 12.8 05/27/2018 0407   RDW 14.6 (H) 12/08/2011 1040   LYMPHSABS 1.1 05/27/2018 0407   LYMPHSABS 1.4 12/08/2011 1040   MONOABS 0.3 05/27/2018 0407   MONOABS 0.3 12/08/2011 1040   EOSABS 0.1 05/27/2018 0407   EOSABS 0.1 12/08/2011 1040   BASOSABS 0.0 05/27/2018 0407   BASOSABS 0.0 12/08/2011 1040    BMET    Component Value Date/Time   NA 141 10/06/2018 0811   NA 142 05/11/2018 0000   NA 143 06/23/2011 1041   K 4.3 10/06/2018 0811   K 4.5 06/23/2011 1041   CL 103 10/06/2018 0811   CL 106 06/23/2011 1041   CO2 30 10/06/2018 0811   CO2 30 06/23/2011 1041   GLUCOSE 104 (H) 10/06/2018 0811   GLUCOSE 99 06/23/2011 1041   BUN 15 10/06/2018 0811   BUN 21 05/11/2018 0000   BUN 13 06/23/2011 1041   CREATININE 1.43 (H) 10/06/2018 0811   CREATININE 1.41 (H) 06/28/2016 1107   CALCIUM 9.4 10/06/2018 0811   CALCIUM 9.9  06/23/2011 1041   GFRNONAA 32 (L) 05/27/2018 0407   GFRNONAA 52 (L) 06/23/2011 1041   GFRAA 37 (L) 05/27/2018 0407   GFRAA >60 06/23/2011 1041    BNP    Component Value Date/Time   BNP 63.0 05/27/2018 0407    ProBNP No results found for: PROBNP  Imaging: No results found.   Assessment & Plan:   COPD with hypoxia ([facility]) - Stable; No recent exacerbations or hospitalizations. Continues to have chronic cough but this has improved on triple therapy.  - Continue Trelegy Ellipta 100 one puff daily - No changes today  - Follow-up in 4-6 months with Dr. Patsey Berthold   Nocturnal hypoxia - ONO in March 2021 showed patient spent 43 min with SpO2 <88% - Continue 2L nocturnal oxygen   Martyn Ehrich, NP 12/18/2019

## 2019-12-18 NOTE — Assessment & Plan Note (Addendum)
-   Stable; No recent exacerbations or hospitalizations. Continues to have chronic cough but this has improved on triple therapy.  - Continue Trelegy Ellipta 100 one puff daily - No changes today  - Follow-up in 4-6 months with Dr. Patsey Berthold

## 2019-12-18 NOTE — Assessment & Plan Note (Addendum)
-   ONO in March 2021 showed patient spent 43 min with SpO2 <88% - Continue 2L nocturnal oxygen

## 2019-12-19 ENCOUNTER — Other Ambulatory Visit: Payer: Self-pay | Admitting: Cardiovascular Disease

## 2019-12-23 ENCOUNTER — Other Ambulatory Visit: Payer: Self-pay | Admitting: Internal Medicine

## 2020-01-04 NOTE — Progress Notes (Signed)
Agree with the details of the visit as noted by Elizabeth Walsh, NP.  C. Laura Shalon Councilman, MD [facility] 

## 2020-01-15 ENCOUNTER — Ambulatory Visit (INDEPENDENT_AMBULATORY_CARE_PROVIDER_SITE_OTHER): Payer: Medicare Other | Admitting: Internal Medicine

## 2020-01-15 ENCOUNTER — Other Ambulatory Visit: Payer: Self-pay

## 2020-01-15 DIAGNOSIS — I499 Cardiac arrhythmia, unspecified: Secondary | ICD-10-CM

## 2020-01-15 DIAGNOSIS — K2270 Barrett's esophagus without dysplasia: Secondary | ICD-10-CM | POA: Diagnosis not present

## 2020-01-15 DIAGNOSIS — R0902 Hypoxemia: Secondary | ICD-10-CM | POA: Diagnosis not present

## 2020-01-15 DIAGNOSIS — I272 Pulmonary hypertension, unspecified: Secondary | ICD-10-CM | POA: Diagnosis not present

## 2020-01-15 DIAGNOSIS — Z0000 Encounter for general adult medical examination without abnormal findings: Secondary | ICD-10-CM

## 2020-01-15 DIAGNOSIS — R739 Hyperglycemia, unspecified: Secondary | ICD-10-CM

## 2020-01-15 DIAGNOSIS — E7800 Pure hypercholesterolemia, unspecified: Secondary | ICD-10-CM

## 2020-01-15 DIAGNOSIS — J449 Chronic obstructive pulmonary disease, unspecified: Secondary | ICD-10-CM

## 2020-01-15 DIAGNOSIS — D649 Anemia, unspecified: Secondary | ICD-10-CM | POA: Diagnosis not present

## 2020-01-15 DIAGNOSIS — I10 Essential (primary) hypertension: Secondary | ICD-10-CM

## 2020-01-15 DIAGNOSIS — N183 Chronic kidney disease, stage 3 unspecified: Secondary | ICD-10-CM | POA: Diagnosis not present

## 2020-01-15 DIAGNOSIS — R0602 Shortness of breath: Secondary | ICD-10-CM

## 2020-01-15 NOTE — Progress Notes (Signed)
Patient ID: Anita Ferrell, female   DOB: 1932-09-26, 86 y.o.   MRN: [id number]   Subjective:    Patient ID: Anita Ferrell, female    DOB: 03/10/1933, 86 y.o.   MRN: [id number]  HPI This visit occurred during the SARS-CoV-2 public health emergency.  Safety protocols were in place, including screening questions prior to the visit, additional usage of staff PPE, and extensive cleaning of exam room while observing appropriate contact time as indicated for disinfecting solutions.  Patient with past history of COPD, hypertension and hypercholesterolemia.  She comes in today to follow up on these issues as well as for a complete physical exam.  She reports she is doing relatively well.  Breathing is overall stable.  Continues trelegy ellipta and uses proventil nebulizer twice a day.  Using oxygen at night.  Cough is some better with trelegy.  Has a history of chronic cough.  Sees cardiology.  States things stable last visit.  No taking lasix qod. Moderate MR - resolved per note.  No chest pain. Eating.  No nausea or vomiting.  No abdominal pain.  Bowels moving.  Some discomfort right thumb and left hip.  Desires no further w/up or intervention.  Blood pressure doing well.   Past Medical History:  Diagnosis Date  . A-fib (HCC)   . Allergy   . Asthma   . CHF (congestive heart failure) (HCC)   . COPD (chronic obstructive pulmonary disease) (HCC)   . GERD (gastroesophageal reflux disease)   . Hypercholesteremia   . Hypertension   . Mitral valve insufficiency   . Pulmonary hypertension (HCC)    Right Ventricular systolic pressure at 60 mm Hg by echo on july of 2011; Right heart cardic catherization revealed pulmonary artery pressusre of 27/10 with a mean of; Ventricular pressure 29/10 with pulmonary capillary wedge at 9  . TB (pulmonary tuberculosis)    treated with INH therapy    Past Surgical History:  Procedure Laterality Date  . CARDIAC CATHETERIZATION  10/2016   [facility]  . CATARACT  EXTRACTION Bilateral 2008  . CHOLECYSTECTOMY    . TONSILLECTOMY  1964   Family History  Problem Relation Age of Onset  . Heart disease Mother   . Heart disease Father   . Hypertension Sister   . Heart disease Brother   . Breast cancer Maternal Aunt    Social History   Socioeconomic History  . Marital status: Widowed    Spouse name: Not on file  . Number of children: 3  . Years of education: Not on file  . Highest education level: Not on file  Occupational History  . Not on file  Tobacco Use  . Smoking status: Former Smoker    Packs/day: 1.00    Years: 40.00    Pack years: 40.00    Types: Cigarettes    Quit date: 08/17/1990    Years since quitting: 29.4  . Smokeless tobacco: Never Used  . Tobacco comment: Quit in 1992  Vaping Use  . Vaping Use: Never used  Substance and Sexual Activity  . Alcohol use: No    Alcohol/week: 0.0 standard drinks  . Drug use: No  . Sexual activity: Not Currently  Other Topics Concern  . Not on file  Social History Narrative  . Not on file   Social Determinants of Health   Financial Resource Strain:   . Difficulty of Paying Living Expenses: Not on file  Food Insecurity:   . Worried About [employer] of  Food in the Last Year: Not on file  . Ran Out of Food in the Last Year: Not on file  Transportation Needs:   . Lack of Transportation (Medical): Not on file  . Lack of Transportation (Non-Medical): Not on file  Physical Activity:   . Days of Exercise per Week: Not on file  . Minutes of Exercise per Session: Not on file  Stress:   . Feeling of Stress : Not on file  Social Connections:   . Frequency of Communication with Friends and Family: Not on file  . Frequency of Social Gatherings with Friends and Family: Not on file  . Attends Religious Services: Not on file  . Active Member of Clubs or Organizations: Not on file  . Attends Archivist Meetings: Not on file  . Marital Status: Not on file    Outpatient Encounter  Medications as of 01/15/2020  Medication Sig  . acetaminophen (TYLENOL) 500 MG tablet Take 500 mg by mouth as needed.   Marland Kitchen albuterol (PROVENTIL) (2.5 MG/3ML) 0.083% nebulizer solution Take 3 mLs (2.5 mg total) by nebulization every 6 (six) hours as needed for wheezing or shortness of breath. DX: R06.02, R06.2  . albuterol (VENTOLIN HFA) 108 (90 Base) MCG/ACT inhaler Inhale 1-2 puffs into the lungs every 6 (six) hours as needed for wheezing or shortness of breath.  Marland Kitchen aspirin EC 81 MG tablet Take 81 mg by mouth daily.   Marland Kitchen azelastine (ASTELIN) 0.1 % nasal spray USE 1 SPRAY EACH NOSTRIL TWO TIMES DAILY  . Calcium Carbonate-Vitamin D (CALCIUM 600+D) 600-400 MG-UNIT tablet Take 1 tablet by mouth at bedtime.  . ferrous sulfate 325 (65 FE) MG tablet Take 325 mg by mouth 2 (two) times daily with a meal.   . fexofenadine (ALLEGRA) 180 MG tablet Take 180 mg by mouth daily.   . Fluticasone-Umeclidin-Vilant (TRELEGY ELLIPTA) 100-62.5-25 MCG/INH AEPB Inhale 1 puff into the lungs daily.  . furosemide (LASIX) 40 MG tablet TAKE 1 TABLET BY MOUTH EVERY OTHER DAY  . lansoprazole (PREVACID) 30 MG capsule Take 30 mg by mouth daily.   . meclizine (ANTIVERT) 25 MG tablet Take 25 mg by mouth as needed for dizziness.  . metoprolol tartrate (LOPRESSOR) 50 MG tablet TAKE 0.5 TABLETS (25 MG TOTAL) BY MOUTH 2 (TWO) TIMES DAILY.  . Multiple Vitamins-Minerals (CENTRUM SILVER PO) Take by mouth.  . OXYGEN Inhale 2 L into the lungs at bedtime.  . Probiotic Product (ALIGN PO) Take by mouth.  . rosuvastatin (CRESTOR) 10 MG tablet TAKE 1 TABLET (10 MG TOTAL) BY MOUTH DAILY. NEEDS APPT FOR FURTHER REFILL. SEE MYCHART MESSAGE  . spironolactone (ALDACTONE) 25 MG tablet TAKE 1/2 TABLET BY MOUTH EVERY DAY   No facility-administered encounter medications on file as of 01/15/2020.    Review of Systems  Constitutional: Negative for appetite change and unexpected weight change.  HENT: Negative for congestion and sinus pressure.   Eyes:  Negative for pain and visual disturbance.  Respiratory: Negative for chest tightness and shortness of breath.        Chronic cough.  Is some better with trelegy.   Cardiovascular: Negative for chest pain, palpitations and leg swelling.  Gastrointestinal: Negative for abdominal pain, diarrhea, nausea and vomiting.  Genitourinary: Negative for difficulty urinating and frequency.  Musculoskeletal: Negative for back pain and joint swelling.  Skin: Negative for color change and rash.  Neurological: Negative for dizziness, light-headedness and headaches.  Hematological: Negative for adenopathy. Does not bruise/bleed easily.  Psychiatric/Behavioral: Negative for  agitation and dysphoric mood.       Objective:    Physical Exam Vitals reviewed.  Constitutional:      General: She is not in acute distress.    Appearance: Normal appearance. She is well-developed.  HENT:     Head: Normocephalic and atraumatic.     Right Ear: External ear normal.     Left Ear: External ear normal.  Eyes:     General: No scleral icterus.       Right eye: No discharge.        Left eye: No discharge.     Conjunctiva/sclera: Conjunctivae normal.  Neck:     Thyroid: No thyromegaly.  Cardiovascular:     Rate and Rhythm: Normal rate and regular rhythm.     Comments: Regular rhythm with premature beats.  Pulmonary:     Effort: No tachypnea, accessory muscle usage or respiratory distress.     Breath sounds: Normal breath sounds. No decreased breath sounds or wheezing.  Chest:     Breasts:        Right: No inverted nipple, mass, nipple discharge or tenderness (no axillary adenopathy).        Left: No inverted nipple, mass, nipple discharge or tenderness (no axilarry adenopathy).  Abdominal:     General: Bowel sounds are normal.     Palpations: Abdomen is soft.     Tenderness: There is no abdominal tenderness.  Musculoskeletal:        General: No swelling or tenderness.     Cervical back: Neck supple. No  tenderness.  Lymphadenopathy:     Cervical: No cervical adenopathy.  Skin:    Findings: No erythema or rash.  Neurological:     Mental Status: She is alert and oriented to person, place, and time.  Psychiatric:        Mood and Affect: Mood normal.        Behavior: Behavior normal.     BP 122/64   Pulse (!) 108   Temp 97.6 F (36.4 C) (Oral)   Resp 16   Ht $R'5\' 5"'Ch$  (1.651 m)   Wt 145 lb (65.8 kg)   SpO2 95%   BMI 24.13 kg/m  Wt Readings from Last 3 Encounters:  01/15/20 145 lb (65.8 kg)  12/18/19 147 lb 9.6 oz (67 kg)  10/01/19 147 lb 2 oz (66.7 kg)     Lab Results  Component Value Date   WBC 2.8 (L) 05/27/2018   HGB 10.6 (L) 05/27/2018   HCT 33.6 (L) 05/27/2018   PLT 152 05/27/2018   GLUCOSE 104 (H) 10/06/2018   CHOL 117 10/06/2018   TRIG 114.0 10/06/2018   HDL 41.60 10/06/2018   LDLDIRECT 127.1 08/25/2012   LDLCALC 53 10/06/2018   ALT 13 10/06/2018   AST 19 10/06/2018   NA 141 10/06/2018   K 4.3 10/06/2018   CL 103 10/06/2018   CREATININE 1.43 (H) 10/06/2018   BUN 15 10/06/2018   CO2 30 10/06/2018   TSH 1.02 02/14/2018   HGBA1C 6.0 10/06/2018    ECHOCARDIOGRAM COMPLETE  Result Date: 07/17/2019    ECHOCARDIOGRAM REPORT   Patient Name:   SHARLYN ODONNEL Date of Exam: 07/17/2019 Medical Rec #:  [id number]          Height:       64.5 in Accession #:    [id number]         Weight:       146.2 lb Date of Birth:  [birth year]  BSA:          1.722 m Patient Age:    13 years           BP:           118/62 mmHg Patient Gender: F                  HR:           100 bpm. Exam Location:  [facility] Procedure: 2D Echo, Cardiac Doppler and Color Doppler Indications:     Dyspnea 786.09  History:         Patient has prior history of Echocardiogram examinations, most                  recent 09/08/2015. CHF, Pulmonary HTN and COPD; Risk                  Factors:Hypertension. Mitral valve regurgitation.  Sonographer:     Sherrie Sport RDCS (AE) Referring Phys:  [id number] Tyler Pita  Diagnosing Phys: Nelva Bush MD  Sonographer Comments: Image acquisition challenging due to COPD. IMPRESSIONS  1. Left ventricular ejection fraction, by estimation, is 55 to 60%. The left ventricle has normal function. The left ventricle has no regional wall motion abnormalities. There is mild left ventricular hypertrophy. Indeterminate diastolic filling due to E-A fusion.  2. Right ventricular systolic function is normal. The right ventricular size is normal. Mildly increased right ventricular wall thickness. There is normal pulmonary artery systolic pressure.  3. The mitral valve was not well visualized. No evidence of mitral valve regurgitation.  4. Tricuspid valve regurgitation is mild to moderate.  5. The aortic valve was not well visualized. Aortic valve regurgitation is not visualized. No aortic stenosis is present.  6. Pulmonic valve regurgitation not well assessed.  7. The inferior vena cava is normal in size with greater than 50% respiratory variability, suggesting right atrial pressure of 3 mmHg. FINDINGS  Left Ventricle: Left ventricular ejection fraction, by estimation, is 55 to 60%. The left ventricle has normal function. The left ventricle has no regional wall motion abnormalities. The left ventricular internal cavity size was normal in size. There is  mild left ventricular hypertrophy. Indeterminate diastolic filling due to E-A fusion. Right Ventricle: The right ventricular size is normal. Mildly increased right ventricular wall thickness. Right ventricular systolic function is normal. There is normal pulmonary artery systolic pressure. The tricuspid regurgitant velocity is 2.52 m/s, and with an assumed right atrial pressure of 3 mmHg, the estimated right ventricular systolic pressure is 23.5 mmHg. Left Atrium: Left atrial size was normal in size. Right Atrium: Right atrial size was normal in size. Pericardium: There is no evidence of pericardial effusion. Presence of pericardial fat pad. Mitral  Valve: The mitral valve was not well visualized. No evidence of mitral valve regurgitation. Tricuspid Valve: The tricuspid valve is not well visualized. Tricuspid valve regurgitation is mild to moderate. Aortic Valve: The aortic valve was not well visualized. Aortic valve regurgitation is not visualized. No aortic stenosis is present. Aortic valve mean gradient measures 2.5 mmHg. Aortic valve peak gradient measures 4.5 mmHg. Aortic valve area, by VTI measures 2.14 cm. Pulmonic Valve: The pulmonic valve was not well visualized. Pulmonic valve regurgitation not well assessed. Aorta: The aortic root is normal in size and structure. Pulmonary Artery: The pulmonary artery is not well seen. Venous: The inferior vena cava is normal in size with greater than 50% respiratory variability, suggesting right atrial pressure of 3 mmHg.  IAS/Shunts: No atrial level shunt detected by color flow Doppler.  LEFT VENTRICLE PLAX 2D LVIDd:         4.27 cm     Diastology LVIDs:         3.31 cm     LV e' lateral:   5.33 cm/s LV PW:         1.26 cm     LV E/e' lateral: 10.8 LV IVS:        1.15 cm     LV e' medial:    5.11 cm/s LVOT diam:     2.00 cm     LV E/e' medial:  11.3 LV SV:         44 LV SV Index:   26 LVOT Area:     3.14 cm  LV Volumes (MOD) LV vol d, MOD A2C: 28.5 ml LV vol d, MOD A4C: 49.4 ml LV vol s, MOD A2C: 12.0 ml LV vol s, MOD A4C: 23.8 ml LV SV MOD A2C:     16.5 ml LV SV MOD A4C:     49.4 ml LV SV MOD BP:      20.9 ml RIGHT VENTRICLE RV Basal diam:  2.63 cm RV S prime:     19.90 cm/s TAPSE (M-mode): 2.9 cm LEFT ATRIUM             Index       RIGHT ATRIUM           Index LA diam:        2.40 cm 1.39 cm/m  RA Area:     13.10 cm LA Vol (A2C):   20.1 ml 11.68 ml/m RA Volume:   35.70 ml  20.74 ml/m LA Vol (A4C):   21.2 ml 12.31 ml/m LA Biplane Vol: 22.6 ml 13.13 ml/m  AORTIC VALVE AV Area (Vmax):    1.98 cm AV Area (Vmean):   2.31 cm AV Area (VTI):     2.14 cm AV Vmax:           105.50 cm/s AV Vmean:          74.400  cm/s AV VTI:            0.206 m AV Peak Grad:      4.5 mmHg AV Mean Grad:      2.5 mmHg LVOT Vmax:         66.50 cm/s LVOT Vmean:        54.700 cm/s LVOT VTI:          0.140 m LVOT/AV VTI ratio: 0.68  AORTA Ao Root diam: 2.60 cm MITRAL VALVE               TRICUSPID VALVE MV Area (PHT): 4.36 cm    TR Peak grad:   25.4 mmHg MV Decel Time: 174 msec    TR Vmax:        252.00 cm/s MV E velocity: 57.70 cm/s MV A velocity: 85.40 cm/s  SHUNTS MV E/A ratio:  0.68        Systemic VTI:  0.14 m                            Systemic Diam: 2.00 cm Nelva Bush MD Electronically signed by Nelva Bush MD Signature Date/Time: 07/17/2019/12:59:33 PM    Final        Assessment & Plan:   Problem List Items Addressed This Visit    Shortness  of breath    Breathing overall stable.  Being followed by pulmonary and cardiology.  No evidence of volume overload today.  Continue trelegy.  Follow.        Pulmonary HTN ([place])    Followed by cardiology.       Irregular heart rhythm    Noted on exam. EKG - SR with PVCs.  No acute ischemic change.  She feels things are stable.  Follow.        Relevant Orders   EKG 12-Lead (Completed)   Hyperglycemia    Low carb diet and exercise.  Follow met b and a1c.       Hypercholesterolemia    On crestor.  Low cholesterol diet and exercise.  Follow lipid panel and liver function tests.        Relevant Orders   Lipid panel   Hepatic function panel   Health care maintenance    Physical today 01/15/20.  Schedule mammogram.  Overdue.  Schedule bone density.  Colonoscopy 04/2014.       Essential hypertension    Blood pressure doing well.  Recheck by me:  122/64.  Continue aldactone and metoprolol.  Follow pressures.  Follow metabolic panel.       Relevant Orders   TSH   Basic metabolic panel   COPD with hypoxia ([place])    Breathing stable.  Cough improved some with trelegy.  Uses oxygen at night.        CKD (chronic kidney disease) stage 3, GFR 30-59 ml/min (HCC)     Followed by nephrology.  Previous renal ultrasound unremarkable.  Continue to avoid antiinflammatories.  Follow metabolic panel.       Barrett's esophagus    On prevacid.  No upper symptoms.       Anemia    Has been evaluated by hematology.  Follow cbc.       Relevant Orders   CBC with Differential/Platelet   IBC + Ferritin       [name], MD

## 2020-01-21 ENCOUNTER — Encounter: Payer: Self-pay | Admitting: Internal Medicine

## 2020-01-24 ENCOUNTER — Other Ambulatory Visit: Payer: Self-pay

## 2020-01-24 DIAGNOSIS — E2839 Other primary ovarian failure: Secondary | ICD-10-CM

## 2020-01-24 DIAGNOSIS — Z1231 Encounter for screening mammogram for malignant neoplasm of breast: Secondary | ICD-10-CM

## 2020-01-27 ENCOUNTER — Encounter: Payer: Self-pay | Admitting: Internal Medicine

## 2020-01-27 NOTE — Assessment & Plan Note (Signed)
Low carb diet and exercise.  Follow met b and a1c.  

## 2020-01-27 NOTE — Assessment & Plan Note (Signed)
Breathing stable.  Cough improved some with trelegy.  Uses oxygen at night.

## 2020-01-27 NOTE — Assessment & Plan Note (Signed)
On prevacid.  No upper symptoms.

## 2020-01-27 NOTE — Assessment & Plan Note (Signed)
Physical today 01/15/20.  Schedule mammogram.  Overdue.  Schedule bone density.  Colonoscopy 04/2014.

## 2020-01-27 NOTE — Assessment & Plan Note (Signed)
Followed by cardiology 

## 2020-01-27 NOTE — Assessment & Plan Note (Signed)
Has been evaluated by hematology.  Follow cbc.   

## 2020-01-27 NOTE — Assessment & Plan Note (Signed)
Noted on exam. EKG - SR with PVCs.  No acute ischemic change.  She feels things are stable.  Follow.

## 2020-01-27 NOTE — Assessment & Plan Note (Signed)
Blood pressure doing well.  Recheck by me:  122/64.  Continue aldactone and metoprolol.  Follow pressures.  Follow metabolic panel.

## 2020-01-27 NOTE — Assessment & Plan Note (Signed)
Breathing overall stable.  Being followed by pulmonary and cardiology.  No evidence of volume overload today.  Continue trelegy.  Follow.

## 2020-01-27 NOTE — Assessment & Plan Note (Signed)
On crestor.  Low cholesterol diet and exercise.  Follow lipid panel and liver function tests.   

## 2020-01-27 NOTE — Assessment & Plan Note (Signed)
Followed by nephrology.  Previous renal ultrasound unremarkable.  Continue to avoid antiinflammatories.  Follow metabolic panel.

## 2020-01-29 ENCOUNTER — Other Ambulatory Visit (INDEPENDENT_AMBULATORY_CARE_PROVIDER_SITE_OTHER): Payer: Medicare Other

## 2020-01-29 ENCOUNTER — Other Ambulatory Visit: Payer: Self-pay

## 2020-01-29 DIAGNOSIS — E7800 Pure hypercholesterolemia, unspecified: Secondary | ICD-10-CM

## 2020-01-29 DIAGNOSIS — D649 Anemia, unspecified: Secondary | ICD-10-CM | POA: Diagnosis not present

## 2020-01-29 DIAGNOSIS — I10 Essential (primary) hypertension: Secondary | ICD-10-CM

## 2020-01-29 LAB — CBC WITH DIFFERENTIAL/PLATELET
Basophils Absolute: 0 10*3/uL (ref 0.0–0.1)
Basophils Relative: 0.5 % (ref 0.0–3.0)
Eosinophils Absolute: 0.1 10*3/uL (ref 0.0–0.7)
Eosinophils Relative: 4.1 % (ref 0.0–5.0)
HCT: 30.6 % — ABNORMAL LOW (ref 36.0–46.0)
Hemoglobin: 9.9 g/dL — ABNORMAL LOW (ref 12.0–15.0)
Lymphocytes Relative: 24 % (ref 12.0–46.0)
Lymphs Abs: 0.7 10*3/uL (ref 0.7–4.0)
MCHC: 32.3 g/dL (ref 30.0–36.0)
MCV: 84.1 fl (ref 78.0–100.0)
Monocytes Absolute: 0.3 10*3/uL (ref 0.1–1.0)
Monocytes Relative: 8.3 % (ref 3.0–12.0)
Neutro Abs: 1.9 10*3/uL (ref 1.4–7.7)
Neutrophils Relative %: 63.1 % (ref 43.0–77.0)
Platelets: 166 10*3/uL (ref 150.0–400.0)
RBC: 3.64 Mil/uL — ABNORMAL LOW (ref 3.87–5.11)
RDW: 14.2 % (ref 11.5–15.5)
WBC: 3 10*3/uL — ABNORMAL LOW (ref 4.0–10.5)

## 2020-01-29 LAB — BASIC METABOLIC PANEL
BUN: 14 mg/dL (ref 6–23)
CO2: 31 mEq/L (ref 19–32)
Calcium: 9.6 mg/dL (ref 8.4–10.5)
Chloride: 105 mEq/L (ref 96–112)
Creatinine, Ser: 1.4 mg/dL — ABNORMAL HIGH (ref 0.40–1.20)
GFR: 34 mL/min — ABNORMAL LOW (ref >60.00)
Glucose, Bld: 98 mg/dL (ref 70–99)
Potassium: 4.9 mEq/L (ref 3.5–5.1)
Sodium: 143 mEq/L (ref 135–145)

## 2020-01-29 LAB — LIPID PANEL
Cholesterol: 114 mg/dL (ref 0–200)
HDL: 40.8 mg/dL (ref >39.00)
LDL Cholesterol: 50 mg/dL (ref 0–99)
NonHDL: 73.17
Total CHOL/HDL Ratio: 3
Triglycerides: 115 mg/dL (ref 0.0–149.0)
VLDL: 23 mg/dL (ref 0.0–40.0)

## 2020-01-29 LAB — IBC + FERRITIN
Ferritin: 78.3 ng/mL (ref 10.0–291.0)
Iron: 52 ug/dL (ref 42–145)
Saturation Ratios: 18.2 % — ABNORMAL LOW (ref 20.0–50.0)
Transferrin: 204 mg/dL — ABNORMAL LOW (ref 212.0–360.0)

## 2020-01-29 LAB — HEPATIC FUNCTION PANEL
ALT: 16 U/L (ref 0–35)
AST: 23 U/L (ref 0–37)
Albumin: 3.9 g/dL (ref 3.5–5.2)
Alkaline Phosphatase: 62 U/L (ref 39–117)
Bilirubin, Direct: 0.1 mg/dL (ref 0.0–0.3)
Total Bilirubin: 0.5 mg/dL (ref 0.2–1.2)
Total Protein: 5.9 g/dL — ABNORMAL LOW (ref 6.0–8.3)

## 2020-01-29 LAB — TSH: TSH: 1.43 u[IU]/mL (ref 0.35–4.50)

## 2020-02-01 ENCOUNTER — Other Ambulatory Visit: Payer: Self-pay | Admitting: Internal Medicine

## 2020-02-01 DIAGNOSIS — D649 Anemia, unspecified: Secondary | ICD-10-CM

## 2020-02-01 NOTE — Progress Notes (Signed)
Order placed for hematology referral.  

## 2020-02-14 DIAGNOSIS — Z23 Encounter for immunization: Secondary | ICD-10-CM | POA: Diagnosis not present

## 2020-02-18 ENCOUNTER — Encounter: Payer: Self-pay | Admitting: Internal Medicine

## 2020-02-19 ENCOUNTER — Other Ambulatory Visit: Payer: Self-pay

## 2020-02-19 MED ORDER — AZELASTINE HCL 0.1 % NA SOLN: NASAL | 3 refills | Status: DC

## 2020-02-22 ENCOUNTER — Ambulatory Visit: Payer: Medicare Other

## 2020-03-05 ENCOUNTER — Other Ambulatory Visit: Payer: Self-pay

## 2020-03-05 ENCOUNTER — Ambulatory Visit
Admission: RE | Admit: 2020-03-05 | Discharge: 2020-03-05 | Disposition: A | Discharge status: Home / Self Care | Payer: Medicare Other | Source: Ambulatory Visit | Attending: Internal Medicine | Admitting: Internal Medicine

## 2020-03-05 DIAGNOSIS — R29890 Loss of height: Secondary | ICD-10-CM | POA: Diagnosis not present

## 2020-03-05 DIAGNOSIS — E2839 Other primary ovarian failure: Secondary | ICD-10-CM | POA: Diagnosis not present

## 2020-03-05 DIAGNOSIS — M85852 Other specified disorders of bone density and structure, left thigh: Secondary | ICD-10-CM | POA: Diagnosis not present

## 2020-03-05 DIAGNOSIS — Z780 Asymptomatic menopausal state: Secondary | ICD-10-CM | POA: Diagnosis not present

## 2020-03-05 DIAGNOSIS — Z1231 Encounter for screening mammogram for malignant neoplasm of breast: Secondary | ICD-10-CM | POA: Diagnosis not present

## 2020-03-05 DIAGNOSIS — Z8709 Personal history of other diseases of the respiratory system: Secondary | ICD-10-CM | POA: Diagnosis not present

## 2020-03-17 ENCOUNTER — Other Ambulatory Visit: Payer: Self-pay | Admitting: Pulmonary Disease

## 2020-04-29 ENCOUNTER — Other Ambulatory Visit: Payer: Self-pay

## 2020-04-29 ENCOUNTER — Encounter: Payer: Self-pay | Admitting: Pulmonary Disease

## 2020-04-29 ENCOUNTER — Ambulatory Visit (INDEPENDENT_AMBULATORY_CARE_PROVIDER_SITE_OTHER): Payer: Medicare Other | Admitting: Pulmonary Disease

## 2020-04-29 VITALS — BP 122/74 | HR 100 | Temp 97.8°F | Ht 65.0 in | Wt 147.0 lb

## 2020-04-29 DIAGNOSIS — J449 Chronic obstructive pulmonary disease, unspecified: Secondary | ICD-10-CM

## 2020-04-29 DIAGNOSIS — R0602 Shortness of breath: Secondary | ICD-10-CM

## 2020-04-29 DIAGNOSIS — G4734 Idiopathic sleep related nonobstructive alveolar hypoventilation: Secondary | ICD-10-CM | POA: Diagnosis not present

## 2020-04-29 NOTE — Patient Instructions (Addendum)
Continue using Trelegy once a day  Continue using oxygen at nighttime  We will see him in follow-up in 6 months time call sooner should any new difficulties arise

## 2020-04-29 NOTE — Progress Notes (Signed)
Subjective:    Patient ID: Anita Ferrell, female    DOB: Apr 30, 1932, 87 y.o.   MRN: [id number]  Requesting MD/Service:Self Date of initial consultation:06/20/15, by Dr. Merton Border Reason for consultation:COPD, lung nodule  PROBLEMS:  Remote smoker Moderate COPD/emphysema Lung nodule -decreased in size, no further follow-up H/O positive PPD - S/P 9 months INH.   DATA: CT chest 07/29/15:Significant decrease in size of the previously demonstrated right lower lobe nodule, compatible with a benign process. No significant change in multiple additional sub cm nodules and calcified granulomata in both lungs, compatible with a benign process. Mild changes of COPD and chronic bronchitis. Resolved infection in the lingula and left lower lobe PFTs 07/29/15: moderate obstruction, normal lung volumes, mild reduction DLCO. TEE 10/19/16: LVEF 45%. Moderate mitral regurgitation, moderate TR [facility] 10/25/16:Nonobstructive coronary disease. Moderate pulmonary hypertension (systolic 50 mmHg, mean 30 mmHg) 2D echo 07/17/2019: LVEF 55 to 60%, indeterminate diastolic parameters.  Normal pulmonary artery systolic pressure.  Tricuspid regurgitation mild to moderate.  No aortic stenosis.  INTERVAL: Last seen on 18 December 2019 by Derl Barrow, NP.  At that time was doing well.   HPI 87 year old remote former smoker follows up for scheduled visit.  Doing well.  Using Trelegy Ellipta 100/25, 1 inhalation daily.  Feels she is doing well with this medication.  Rare use of albuterol.  On nocturnal oxygen at 2 L/min feels refreshed when she wakes up in the morning.  Compliant.  No overt complaint today no chest pain, fevers, chills or sweats.  No orthopnea or paroxysmal nocturnal dyspnea.  No lower extremity edema.  Overall she feels well and looks well.   Review of Systems A 10 point review of systems was performed and it is as noted above otherwise negative.  Patient Active Problem List   Diagnosis  Date Noted  . Low back pain 05/20/2020  . Left hip pain 05/20/2020  . Irregular heart rhythm 01/15/2020  . Nocturnal hypoxia 12/18/2019  . Pulmonary HTN ([place]) 12/26/2016  . Moderate mitral regurgitation 10/27/2016  . Bradycardia 09/23/2016  . Ventricular bigeminy 09/23/2016  . Diarrhea 01/13/2016  . CKD (chronic kidney disease) stage 3, GFR 30-59 ml/min (HCC) 10/16/2015  . Shortness of breath 03/11/2015  . COPD with hypoxia ([place]) 03/11/2015  . Environmental allergies 01/07/2015  . Health care maintenance 05/29/2014  . Nephrolithiasis 03/13/2014  . Obesity 03/10/2014  . Splenomegaly 03/05/2014  . Rectal bleeding 03/05/2014  . Hyperglycemia 12/30/2012  . Hemorrhoids 12/30/2012  . Grade I internal hemorrhoids 12/30/2012  . Anemia 08/27/2012  . Hypercholesterolemia 08/25/2012  . Essential hypertension 05/28/2012  . Asthma, chronic 05/28/2012  . GERD (gastroesophageal reflux disease) 05/28/2012  . Barrett's esophagus 05/28/2012   Social History   Tobacco Use  . Smoking status: Former Smoker    Packs/day: 1.00    Years: 40.00    Pack years: 40.00    Types: Cigarettes    Quit date: 08/17/1990    Years since quitting: 29.9  . Smokeless tobacco: Never Used  . Tobacco comment: Quit in 1992  Substance Use Topics  . Alcohol use: No    Alcohol/week: 0.0 standard drinks   Allergies  Allergen Reactions  . Penicillin G Swelling  . Penicillins Swelling and Other (See Comments)    Has patient had a PCN reaction causing immediate rash, facial/tongue/throat swelling, SOB or lightheadedness with hypotension: Yes Has patient had a PCN reaction causing severe rash involving mucus membranes or skin necrosis: No Has patient had a PCN reaction  that required hospitalization No Has patient had a PCN reaction occurring within the last 10 years: No If all of the above answers are "NO", then may proceed with Cephalosporin use.   Current Meds  Medication Sig  . acetaminophen (TYLENOL) 500 MG  tablet Take 500 mg by mouth as needed.   Marland Kitchen albuterol (PROVENTIL) (2.5 MG/3ML) 0.083% nebulizer solution Take 3 mLs (2.5 mg total) by nebulization every 6 (six) hours as needed for wheezing or shortness of breath. DX: R06.02, R06.2  . albuterol (VENTOLIN HFA) 108 (90 Base) MCG/ACT inhaler Inhale 1-2 puffs into the lungs every 6 (six) hours as needed for wheezing or shortness of breath.  Marland Kitchen aspirin EC 81 MG tablet Take 81 mg by mouth daily.  . Calcium Carbonate-Vitamin D 600-400 MG-UNIT tablet Take 1 tablet by mouth at bedtime.  . ferrous sulfate 325 (65 FE) MG tablet Take 325 mg by mouth 2 (two) times daily with a meal.   . fexofenadine (ALLEGRA) 180 MG tablet Take 180 mg by mouth daily.  . furosemide (LASIX) 40 MG tablet TAKE 1 TABLET BY MOUTH EVERY OTHER DAY  . lansoprazole (PREVACID) 30 MG capsule Take 30 mg by mouth daily.  . meclizine (ANTIVERT) 25 MG tablet Take 25 mg by mouth as needed for dizziness.  . Multiple Vitamins-Minerals (CENTRUM SILVER PO) Take by mouth.  . OXYGEN Inhale 2 L into the lungs at bedtime.  . Probiotic Product (ALIGN PO) Take by mouth.  . spironolactone (ALDACTONE) 25 MG tablet TAKE 1/2 TABLET BY MOUTH EVERY DAY  . TRELEGY ELLIPTA 100-62.5-25 MCG/INH AEPB TAKE 1 PUFF BY MOUTH EVERY DAY  . [DISCONTINUED] azelastine (ASTELIN) 0.1 % nasal spray USE 1 SPRAY EACH NOSTRIL TWO TIMES DAILY  . [DISCONTINUED] metoprolol tartrate (LOPRESSOR) 50 MG tablet TAKE 0.5 TABLETS (25 MG TOTAL) BY MOUTH 2 (TWO) TIMES DAILY.  . [DISCONTINUED] rosuvastatin (CRESTOR) 10 MG tablet TAKE 1 TABLET (10 MG TOTAL) BY MOUTH DAILY. NEEDS APPT FOR FURTHER REFILL. SEE [place]   Immunization History  Administered Date(s) Administered  . Fluad Quad(high Dose 65+) 12/15/2018  . Influenza Split 01/15/2020  . Influenza, High Dose Seasonal PF 01/13/2016, 01/25/2017, 01/10/2018  . Influenza,inj,Quad PF,6+ Mos 12/26/2012, 01/12/2014, 01/14/2015  . Moderna Sars-Covid-2 Vaccination 05/02/2019,  05/30/2019, 02/14/2020  . Pneumococcal Conjugate-13 09/02/2014  . Pneumococcal Polysaccharide-23 11/14/2012  . Tdap 11/14/2012        Objective:   Physical Exam BP 122/74 (BP Location: Left Arm, Cuff Size: Normal)   Pulse 100   Temp 97.8 F (36.6 C) (Temporal)   Ht 5\' 5"  (1.651 m)   Wt 147 lb (66.7 kg)   SpO2 100%   BMI 24.46 kg/m   GENERAL: Well-nourished female, spry, no acute respiratory distress, fully ambulatory. HEAD: Normocephalic, atraumatic.  EYES: Pupils equal, round, reactive to light.  No scleral icterus.  MOUTH: Nose/mouth/throat not examined due to masking requirements for COVID 19. NECK: Supple. No thyromegaly. No nodules. No JVD.  Trachea midline PULMONARY: Distant breath sounds, coarse, no other adventitious sounds CARDIOVASCULAR: S1 and S2. Regular rate and rhythm.  Grade 2/6 systolic ejection murmur consistent with mitral regurg. GASTROINTESTINAL: Nondistended abdomen.   MUSCULOSKELETAL: No joint deformity, no clubbing, no edema.  NEUROLOGIC: No overt focal deficits speech fluent. SKIN: Intact,warm,dry.  Limited exam no rashes. PSYCH: Mood and behavior normal.      Assessment & Plan:     ICD-10-CM   1. COPD, moderate ([place])  J44.9    Stable, continue Trelegy Continue as needed albuterol  2. Nocturnal hypoxia  G47.34    Compliant with O2 Continue nocturnal O2 at 2 L/min  3. Shortness of breath  R06.02    Compensated with Trelegy   We will see the patient in follow-up in 6 months time she is to contact us prior to that time should any new difficulties arise.  Renold Don, MD  PCCM   *This note was dictated using voice recognition software/Dragon.  Despite best efforts to proofread, errors can occur which can change the meaning.  Any change was purely unintentional.

## 2020-05-13 ENCOUNTER — Other Ambulatory Visit: Payer: Self-pay | Admitting: Internal Medicine

## 2020-05-14 ENCOUNTER — Other Ambulatory Visit: Payer: Self-pay | Admitting: Internal Medicine

## 2020-05-20 ENCOUNTER — Encounter: Payer: Self-pay | Admitting: Internal Medicine

## 2020-05-20 ENCOUNTER — Ambulatory Visit (INDEPENDENT_AMBULATORY_CARE_PROVIDER_SITE_OTHER): Payer: Medicare Other

## 2020-05-20 ENCOUNTER — Other Ambulatory Visit: Payer: Self-pay

## 2020-05-20 ENCOUNTER — Ambulatory Visit (INDEPENDENT_AMBULATORY_CARE_PROVIDER_SITE_OTHER): Payer: Medicare Other | Admitting: Internal Medicine

## 2020-05-20 VITALS — BP 126/76 | HR 103 | Temp 98.2°F | Ht 65.0 in | Wt 146.5 lb

## 2020-05-20 DIAGNOSIS — M25552 Pain in left hip: Secondary | ICD-10-CM | POA: Diagnosis not present

## 2020-05-20 DIAGNOSIS — M545 Low back pain, unspecified: Secondary | ICD-10-CM

## 2020-05-20 DIAGNOSIS — R0602 Shortness of breath: Secondary | ICD-10-CM | POA: Diagnosis not present

## 2020-05-20 DIAGNOSIS — K2100 Gastro-esophageal reflux disease with esophagitis, without bleeding: Secondary | ICD-10-CM | POA: Diagnosis not present

## 2020-05-20 DIAGNOSIS — N183 Chronic kidney disease, stage 3 unspecified: Secondary | ICD-10-CM

## 2020-05-20 DIAGNOSIS — I272 Pulmonary hypertension, unspecified: Secondary | ICD-10-CM

## 2020-05-20 DIAGNOSIS — G4734 Idiopathic sleep related nonobstructive alveolar hypoventilation: Secondary | ICD-10-CM | POA: Diagnosis not present

## 2020-05-20 DIAGNOSIS — J449 Chronic obstructive pulmonary disease, unspecified: Secondary | ICD-10-CM | POA: Diagnosis not present

## 2020-05-20 DIAGNOSIS — E7800 Pure hypercholesterolemia, unspecified: Secondary | ICD-10-CM | POA: Diagnosis not present

## 2020-05-20 DIAGNOSIS — R739 Hyperglycemia, unspecified: Secondary | ICD-10-CM | POA: Diagnosis not present

## 2020-05-20 DIAGNOSIS — I10 Essential (primary) hypertension: Secondary | ICD-10-CM | POA: Diagnosis not present

## 2020-05-20 DIAGNOSIS — D649 Anemia, unspecified: Secondary | ICD-10-CM

## 2020-05-20 DIAGNOSIS — R0902 Hypoxemia: Secondary | ICD-10-CM

## 2020-05-20 LAB — LIPID PANEL
Cholesterol: 117 mg/dL (ref 0–200)
HDL: 39.9 mg/dL (ref >39.00)
LDL Cholesterol: 53 mg/dL (ref 0–99)
NonHDL: 77.01
Total CHOL/HDL Ratio: 3
Triglycerides: 121 mg/dL (ref 0.0–149.0)
VLDL: 24.2 mg/dL (ref 0.0–40.0)

## 2020-05-20 LAB — CBC WITH DIFFERENTIAL/PLATELET
Basophils Absolute: 0 10*3/uL (ref 0.0–0.1)
Basophils Relative: 0.4 % (ref 0.0–3.0)
Eosinophils Absolute: 0.1 10*3/uL (ref 0.0–0.7)
Eosinophils Relative: 3 % (ref 0.0–5.0)
HCT: 30.8 % — ABNORMAL LOW (ref 36.0–46.0)
Hemoglobin: 10.1 g/dL — ABNORMAL LOW (ref 12.0–15.0)
Lymphocytes Relative: 27 % (ref 12.0–46.0)
Lymphs Abs: 0.9 10*3/uL (ref 0.7–4.0)
MCHC: 32.9 g/dL (ref 30.0–36.0)
MCV: 82.3 fl (ref 78.0–100.0)
Monocytes Absolute: 0.2 10*3/uL (ref 0.1–1.0)
Monocytes Relative: 7.3 % (ref 3.0–12.0)
Neutro Abs: 2 10*3/uL (ref 1.4–7.7)
Neutrophils Relative %: 62.3 % (ref 43.0–77.0)
Platelets: 177 10*3/uL (ref 150.0–400.0)
RBC: 3.74 Mil/uL — ABNORMAL LOW (ref 3.87–5.11)
RDW: 14.9 % (ref 11.5–15.5)
WBC: 3.2 10*3/uL — ABNORMAL LOW (ref 4.0–10.5)

## 2020-05-20 LAB — IBC + FERRITIN
Ferritin: 41.5 ng/mL (ref 10.0–291.0)
Iron: 60 ug/dL (ref 42–145)
Saturation Ratios: 19.7 % — ABNORMAL LOW (ref 20.0–50.0)
Transferrin: 218 mg/dL (ref 212.0–360.0)

## 2020-05-20 LAB — HEPATIC FUNCTION PANEL
ALT: 20 U/L (ref 0–35)
AST: 27 U/L (ref 0–37)
Albumin: 4.1 g/dL (ref 3.5–5.2)
Alkaline Phosphatase: 64 U/L (ref 39–117)
Bilirubin, Direct: 0.1 mg/dL (ref 0.0–0.3)
Total Bilirubin: 0.5 mg/dL (ref 0.2–1.2)
Total Protein: 6.2 g/dL (ref 6.0–8.3)

## 2020-05-20 LAB — BASIC METABOLIC PANEL
BUN: 20 mg/dL (ref 6–23)
CO2: 32 mEq/L (ref 19–32)
Calcium: 10.5 mg/dL (ref 8.4–10.5)
Chloride: 104 mEq/L (ref 96–112)
Creatinine, Ser: 1.44 mg/dL — ABNORMAL HIGH (ref 0.40–1.20)
GFR: 33.04 mL/min — ABNORMAL LOW (ref >60.00)
Glucose, Bld: 112 mg/dL — ABNORMAL HIGH (ref 70–99)
Potassium: 4.3 mEq/L (ref 3.5–5.1)
Sodium: 143 mEq/L (ref 135–145)

## 2020-05-20 LAB — HEMOGLOBIN A1C: Hgb A1c MFr Bld: 5.8 % (ref 4.6–6.5)

## 2020-05-20 LAB — VITAMIN B12: Vitamin B-12: 560 pg/mL (ref 211–911)

## 2020-05-20 NOTE — Progress Notes (Unsigned)
Patient ID: Anita Ferrell, female   DOB: 09/22/1932, 87 y.o.   MRN: [id number]   Subjective:    Patient ID: Anita Ferrell, female    DOB: 14-Sep-1932, 87 y.o.   MRN: [id number]  HPI This visit occurred during the SARS-CoV-2 public health emergency.  Safety protocols were in place, including screening questions prior to the visit, additional usage of staff PPE, and extensive cleaning of exam room while observing appropriate contact time as indicated for disinfecting solutions.  Patient here for a scheduled follow up. Here to follow up regarding her blood pressure, cholesterol, sugar and her underlying lung issues.  She reports she is doing relatively well.  Breathing overall stable. Improved with trelegy.  Uses oxygen at night. Occasional dizziness with fast movements, but states this rarely occurs.  No chest pain, increased heart rate or palpitations reported.  Some occasional acid reflux.  Discussed pepcid.  No abdominal pain. Bowels moving.  Reports left lower back and left hip pain.  No pain radiating down leg.  Handling stress.    Past Medical History:  Diagnosis Date  . A-fib ([place])   . Allergy   . Asthma   . CHF (congestive heart failure) ([place])   . COPD (chronic obstructive pulmonary disease) ([place])   . GERD (gastroesophageal reflux disease)   . Hypercholesteremia   . Hypertension   . Mitral valve insufficiency   . Pulmonary hypertension (HCC)    Right Ventricular systolic pressure at 60 mm Hg by echo on july of 2011; Right heart cardic catherization revealed pulmonary artery pressusre of 27/10 with a mean of; Ventricular pressure 29/10 with pulmonary capillary wedge at 9  . TB (pulmonary tuberculosis)    treated with INH therapy    Past Surgical History:  Procedure Laterality Date  . CARDIAC CATHETERIZATION  10/2016   [facility]  . CATARACT EXTRACTION Bilateral 2008  . CHOLECYSTECTOMY    . TONSILLECTOMY  1964   Family History  Problem Relation Age of Onset  . Heart disease  Mother   . Heart disease Father   . Hypertension Sister   . Heart disease Brother   . Breast cancer Maternal Aunt    Social History   Socioeconomic History  . Marital status: Widowed    Spouse name: Not on file  . Number of children: 3  . Years of education: Not on file  . Highest education level: Not on file  Occupational History  . Not on file  Tobacco Use  . Smoking status: Former Smoker    Packs/day: 1.00    Years: 40.00    Pack years: 40.00    Types: Cigarettes    Quit date: 08/17/1990    Years since quitting: 29.7  . Smokeless tobacco: Never Used  . Tobacco comment: Quit in 1992  Vaping Use  . Vaping Use: Never used  Substance and Sexual Activity  . Alcohol use: No    Alcohol/week: 0.0 standard drinks  . Drug use: No  . Sexual activity: Not Currently  Other Topics Concern  . Not on file  Social History Narrative  . Not on file   Social Determinants of Health   Financial Resource Strain: Not on file  Food Insecurity: Not on file  Transportation Needs: Not on file  Physical Activity: Not on file  Stress: Not on file  Social Connections: Not on file    Outpatient Encounter Medications as of 05/20/2020  Medication Sig  . acetaminophen (TYLENOL) 500 MG tablet Take 500 mg by  mouth as needed.   Marland Kitchen albuterol (PROVENTIL) (2.5 MG/3ML) 0.083% nebulizer solution Take 3 mLs (2.5 mg total) by nebulization every 6 (six) hours as needed for wheezing or shortness of breath. DX: R06.02, R06.2  . albuterol (VENTOLIN HFA) 108 (90 Base) MCG/ACT inhaler Inhale 1-2 puffs into the lungs every 6 (six) hours as needed for wheezing or shortness of breath.  Marland Kitchen aspirin EC 81 MG tablet Take 81 mg by mouth daily.  Marland Kitchen azelastine (ASTELIN) 0.1 % nasal spray USE 1 SPRAY EACH NOSTRIL TWO TIMES DAILY  . Calcium Carbonate-Vitamin D 600-400 MG-UNIT tablet Take 1 tablet by mouth at bedtime.  . ferrous sulfate 325 (65 FE) MG tablet Take 325 mg by mouth 2 (two) times daily with a meal.   .  fexofenadine (ALLEGRA) 180 MG tablet Take 180 mg by mouth daily.  . furosemide (LASIX) 40 MG tablet TAKE 1 TABLET BY MOUTH EVERY OTHER DAY  . lansoprazole (PREVACID) 30 MG capsule Take 30 mg by mouth daily.  . meclizine (ANTIVERT) 25 MG tablet Take 25 mg by mouth as needed for dizziness.  . metoprolol tartrate (LOPRESSOR) 50 MG tablet TAKE 0.5 TABLETS (25 MG TOTAL) BY MOUTH 2 (TWO) TIMES DAILY.  . Multiple Vitamins-Minerals (CENTRUM SILVER PO) Take by mouth.  . OXYGEN Inhale 2 L into the lungs at bedtime.  . Probiotic Product (ALIGN PO) Take by mouth.  . rosuvastatin (CRESTOR) 10 MG tablet TAKE 1 TABLET (10 MG TOTAL) BY MOUTH DAILY. NEEDS APPT FOR FURTHER REFILL. SEE MYCHART MESSAGE  . spironolactone (ALDACTONE) 25 MG tablet TAKE 1/2 TABLET BY MOUTH EVERY DAY  . TRELEGY ELLIPTA 100-62.5-25 MCG/INH AEPB TAKE 1 PUFF BY MOUTH EVERY DAY   No facility-administered encounter medications on file as of 05/20/2020.    Review of Systems  Constitutional: Negative for appetite change and unexpected weight change.  HENT: Negative for congestion and sinus pressure.   Respiratory: Negative for cough and chest tightness.        Breathing stable.   Cardiovascular: Negative for chest pain, palpitations and leg swelling.  Gastrointestinal: Negative for abdominal pain, diarrhea, nausea and vomiting.  Genitourinary: Negative for difficulty urinating and dysuria.  Musculoskeletal: Negative for joint swelling and myalgias.  Skin: Negative for color change and rash.  Neurological: Negative for headaches.       Occasional dizziness as outlined.   Psychiatric/Behavioral: Negative for agitation and dysphoric mood.       Objective:    Physical Exam Vitals reviewed.  Constitutional:      General: She is not in acute distress.    Appearance: Normal appearance.  HENT:     Head: Normocephalic and atraumatic.     Right Ear: External ear normal.     Left Ear: External ear normal.     Mouth/Throat:     Mouth:  Oropharynx is clear and moist.  Eyes:     General: No scleral icterus.       Right eye: No discharge.        Left eye: No discharge.     Conjunctiva/sclera: Conjunctivae normal.  Neck:     Thyroid: No thyromegaly.  Cardiovascular:     Rate and Rhythm: Normal rate and regular rhythm.  Pulmonary:     Effort: No respiratory distress.     Breath sounds: Normal breath sounds. No wheezing.  Abdominal:     General: Bowel sounds are normal.     Palpations: Abdomen is soft.     Tenderness: There is no abdominal  tenderness.  Musculoskeletal:        General: No swelling, tenderness or edema.     Cervical back: Neck supple. No tenderness.  Lymphadenopathy:     Cervical: No cervical adenopathy.  Skin:    Findings: No erythema or rash.  Neurological:     Mental Status: She is alert.  Psychiatric:        Mood and Affect: Mood normal.        Behavior: Behavior normal.     BP 126/76   Pulse (!) 103   Temp 98.2 F (36.8 C)   Ht 5\' 5"  (1.651 m)   Wt 146 lb 8 oz (66.5 kg)   SpO2 97%   BMI 24.38 kg/m  Wt Readings from Last 3 Encounters:  05/20/20 146 lb 8 oz (66.5 kg)  04/29/20 147 lb (66.7 kg)  01/15/20 145 lb (65.8 kg)     Lab Results  Component Value Date   WBC 3.2 (L) 05/20/2020   HGB 10.1 (L) 05/20/2020   HCT 30.8 (L) 05/20/2020   PLT 177.0 05/20/2020   GLUCOSE 112 (H) 05/20/2020   CHOL 117 05/20/2020   TRIG 121.0 05/20/2020   HDL 39.90 05/20/2020   LDLDIRECT 127.1 08/25/2012   LDLCALC 53 05/20/2020   ALT 20 05/20/2020   AST 27 05/20/2020   NA 143 05/20/2020   K 4.3 05/20/2020   CL 104 05/20/2020   CREATININE 1.44 (H) 05/20/2020   BUN 20 05/20/2020   CO2 32 05/20/2020   TSH 1.43 01/29/2020   HGBA1C 5.8 05/20/2020    DG Bone Density  Result Date: 03/05/2020 EXAM: DUAL X-RAY ABSORPTIOMETRY (DXA) FOR BONE MINERAL DENSITY IMPRESSION: Your patient 03/07/2020 [place] completed a BMD test on 03/05/2020 using the 03/07/2020 DXA System (software version: 14.10)  manufactured by [employer]. The following summarizes the results of our evaluation. Technologist:VLM PATIENT BIOGRAPHICAL: Name: Jaelynn, Pozo Patient ID: Ricka Burdock Birth Date: 08-Dec-1932 Height: 62.5 in. Gender: Female Exam Date: 03/05/2020 Weight: 147.0 lbs. Indications: Asthma, Caucasian, COPD, Height Loss, Postmenopausal Fractures: Treatments: Albuterol, calcium w/ vit D DENSITOMETRY RESULTS: Site         Region     Measured Date Measured Age WHO Classification Young Adult T-score BMD         %Change vs. Previous Significant Change (*) DualFemur Neck Left 03/05/2020 85.8 Osteopenia -2.2 0.728 g/cm2 - - Left Forearm Radius 33% 03/05/2020 85.8 Osteoporosis -4.5 0.480 g/cm2 - - ASSESSMENT: The BMD measured at Forearm Radius 33% is 0.480 g/cm2 with a T-score of -4.5. This patient is considered osteoporotic according to World Health Organization [facility]) criteria. Lumbar spine was not utilized due to advanced degenerative changes. The scan quality is good. World [facility]) criteria for post-menopausal, Caucasian Women: Normal:                   T-score at or above -1 SD Osteopenia/low bone mass: T-score between -1 and -2.5 SD Osteoporosis:             T-score at or below -2.5 SD RECOMMENDATIONS: 1. All patients should optimize calcium and vitamin D intake. 2. Consider FDA-approved medical therapies in postmenopausal women and men aged 74 years and older, based on the following: a. A hip or vertebral(clinical or morphometric) fracture b. T-score < -2.5 at the femoral neck or spine after appropriate evaluation to exclude secondary causes c. Low bone mass (T-score between -1.0 and -2.5 at the femoral neck or spine) and a 10-year probability  of a hip fracture > 3% or a 10-year probability of a major osteoporosis-related fracture > 20% based on the [place]-adapted WHO algorithm 3. Clinician judgment and/or patient preferences may indicate treatment for people with 10-year fracture probabilities  above or below these levels FOLLOW-UP: People with diagnosed cases of osteoporosis or at high risk for fracture should have regular bone mineral density tests. For patients eligible for Medicare, routine testing is allowed once every 2 years. The testing frequency can be increased to one year for patients who have rapidly progressing disease, those who are receiving or discontinuing medical therapy to restore bone mass, or have additional risk factors. Electronically Signed   By: Marlaine Hind M.D.   On: 03/05/2020 15:20   MM 3D SCREEN BREAST BILATERAL  Result Date: 03/09/2020 CLINICAL DATA:  Screening. EXAM: DIGITAL SCREENING BILATERAL MAMMOGRAM WITH TOMO AND CAD COMPARISON:  Previous exam(s). ACR Breast Density Category b: There are scattered areas of fibroglandular density. FINDINGS: There are no findings suspicious for malignancy. Images were processed with CAD. IMPRESSION: No mammographic evidence of malignancy. A result letter of this screening mammogram will be mailed directly to the patient. RECOMMENDATION: Screening mammogram in one year. (Code:[id number]-B-01Y) BI-RADS CATEGORY  1: Negative. Electronically Signed   By: Dorise Bullion III M.D   On: 03/09/2020 08:23       Assessment & Plan:   Problem List Items Addressed This Visit    Anemia    Has been evaluated by hematology.  Follow cbc.       Relevant Orders   CBC with Differential/Platelet (Completed)   Vitamin B12 (Completed)   IBC + Ferritin (Completed)   CKD (chronic kidney disease) stage 3, GFR 30-59 ml/min (HCC)    Previous ultrasound unremarkable.  conitnue to avoid antiinflammatories.  Follow metabolic panel.       COPD with hypoxia (HCC)    Breathing stable.  Doing better with trelegy.  Oxygen at night.  Follow.       Essential hypertension    Blood pressure doing well as outlined.  Continue aldactone and metoprolol.  Follow pressures.  Follow metabolic panel.       GERD (gastroesophageal reflux disease)    On prevacid.   Occasional breakthrough.  States if watches diet - controlled.  pepcid if needed.       Hypercholesterolemia    On crestor.  Low cholesterol diet and exercise.  Follow lipid panel and liver function tests.        Relevant Orders   Hepatic function panel (Completed)   Lipid panel (Completed)   Basic metabolic panel (Completed)   Hyperglycemia - Primary    Low carb diet and exercise.  Follow met b and a1c.       Relevant Orders   Hemoglobin A1c (Completed)   Left hip pain    Left hip pain as outlined.  Tylenol prn.  Check xray.        Relevant Orders   DG Hip Unilat W OR W/O Pelvis 2-3 Views Left (Completed)   Low back pain    Left low back pain and left hip pain as outlined.  Tylenol as directed.  Pt request xray.  Follow.       Relevant Orders   DG Lumbar Spine 2-3 Views (Completed)   Nocturnal hypoxia    Continue oxygen at night       Pulmonary HTN ([place])    Followed by cardiology.       Shortness of breath  Breathing stable.  Uses oxygen at night.  Continue trelegy.  No evidence of volume overload.  Follow.           [name], MD

## 2020-05-21 ENCOUNTER — Encounter: Payer: Self-pay | Admitting: Internal Medicine

## 2020-05-21 NOTE — Assessment & Plan Note (Signed)
Previous ultrasound unremarkable.  conitnue to avoid antiinflammatories.  Follow metabolic panel.

## 2020-05-21 NOTE — Assessment & Plan Note (Signed)
Followed by cardiology 

## 2020-05-21 NOTE — Assessment & Plan Note (Signed)
Blood pressure doing well as outlined.  Continue aldactone and metoprolol.  Follow pressures.  Follow metabolic panel.

## 2020-05-21 NOTE — Assessment & Plan Note (Signed)
Left hip pain as outlined.  Tylenol prn.  Check xray.

## 2020-05-21 NOTE — Assessment & Plan Note (Signed)
Low carb diet and exercise.  Follow met b and a1c.  

## 2020-05-21 NOTE — Assessment & Plan Note (Signed)
Continue oxygen at night.   

## 2020-05-21 NOTE — Assessment & Plan Note (Signed)
On prevacid.  Occasional breakthrough.  States if watches diet - controlled.  pepcid if needed.

## 2020-05-21 NOTE — Assessment & Plan Note (Signed)
Breathing stable.  Doing better with trelegy.  Oxygen at night.  Follow.

## 2020-05-21 NOTE — Assessment & Plan Note (Signed)
Has been evaluated by hematology.  Follow cbc.   

## 2020-05-21 NOTE — Assessment & Plan Note (Signed)
Left low back pain and left hip pain as outlined.  Tylenol as directed.  Pt request xray.  Follow.

## 2020-05-21 NOTE — Assessment & Plan Note (Signed)
Breathing stable.  Uses oxygen at night.  Continue trelegy.  No evidence of volume overload.  Follow.

## 2020-05-21 NOTE — Assessment & Plan Note (Signed)
On crestor.  Low cholesterol diet and exercise.  Follow lipid panel and liver function tests.   

## 2020-05-23 ENCOUNTER — Other Ambulatory Visit: Payer: Self-pay | Admitting: Internal Medicine

## 2020-05-23 DIAGNOSIS — M545 Low back pain, unspecified: Secondary | ICD-10-CM

## 2020-05-23 NOTE — Progress Notes (Signed)
Order placed for physical therapy referral.

## 2020-06-12 DIAGNOSIS — M5451 Vertebrogenic low back pain: Secondary | ICD-10-CM | POA: Diagnosis not present

## 2020-06-18 DIAGNOSIS — M5451 Vertebrogenic low back pain: Secondary | ICD-10-CM | POA: Diagnosis not present

## 2020-06-20 DIAGNOSIS — M5451 Vertebrogenic low back pain: Secondary | ICD-10-CM | POA: Diagnosis not present

## 2020-06-24 DIAGNOSIS — M5451 Vertebrogenic low back pain: Secondary | ICD-10-CM | POA: Diagnosis not present

## 2020-06-27 DIAGNOSIS — M5451 Vertebrogenic low back pain: Secondary | ICD-10-CM | POA: Diagnosis not present

## 2020-07-01 DIAGNOSIS — M5451 Vertebrogenic low back pain: Secondary | ICD-10-CM | POA: Diagnosis not present

## 2020-07-04 DIAGNOSIS — M5451 Vertebrogenic low back pain: Secondary | ICD-10-CM | POA: Diagnosis not present

## 2020-07-07 DIAGNOSIS — M5451 Vertebrogenic low back pain: Secondary | ICD-10-CM | POA: Diagnosis not present

## 2020-07-09 ENCOUNTER — Other Ambulatory Visit: Payer: Self-pay | Admitting: Cardiovascular Disease

## 2020-07-10 ENCOUNTER — Other Ambulatory Visit: Payer: Self-pay | Admitting: Internal Medicine

## 2020-07-10 DIAGNOSIS — M5451 Vertebrogenic low back pain: Secondary | ICD-10-CM | POA: Diagnosis not present

## 2020-08-04 ENCOUNTER — Encounter: Payer: Self-pay | Admitting: Pulmonary Disease

## 2020-08-11 DIAGNOSIS — Z23 Encounter for immunization: Secondary | ICD-10-CM | POA: Diagnosis not present

## 2020-08-19 ENCOUNTER — Other Ambulatory Visit: Payer: Self-pay

## 2020-08-19 ENCOUNTER — Ambulatory Visit (INDEPENDENT_AMBULATORY_CARE_PROVIDER_SITE_OTHER): Payer: Medicare Other | Admitting: Internal Medicine

## 2020-08-19 VITALS — BP 126/72 | HR 96 | Temp 97.1°F | Resp 16 | Ht 65.0 in | Wt 143.0 lb

## 2020-08-19 DIAGNOSIS — N183 Chronic kidney disease, stage 3 unspecified: Secondary | ICD-10-CM

## 2020-08-19 DIAGNOSIS — M810 Age-related osteoporosis without current pathological fracture: Secondary | ICD-10-CM | POA: Insufficient documentation

## 2020-08-19 DIAGNOSIS — E7800 Pure hypercholesterolemia, unspecified: Secondary | ICD-10-CM

## 2020-08-19 DIAGNOSIS — I10 Essential (primary) hypertension: Secondary | ICD-10-CM

## 2020-08-19 DIAGNOSIS — K2100 Gastro-esophageal reflux disease with esophagitis, without bleeding: Secondary | ICD-10-CM | POA: Diagnosis not present

## 2020-08-19 DIAGNOSIS — R739 Hyperglycemia, unspecified: Secondary | ICD-10-CM | POA: Diagnosis not present

## 2020-08-19 DIAGNOSIS — M545 Low back pain, unspecified: Secondary | ICD-10-CM

## 2020-08-19 DIAGNOSIS — M25552 Pain in left hip: Secondary | ICD-10-CM

## 2020-08-19 DIAGNOSIS — I272 Pulmonary hypertension, unspecified: Secondary | ICD-10-CM | POA: Diagnosis not present

## 2020-08-19 DIAGNOSIS — R0902 Hypoxemia: Secondary | ICD-10-CM

## 2020-08-19 DIAGNOSIS — J449 Chronic obstructive pulmonary disease, unspecified: Secondary | ICD-10-CM | POA: Diagnosis not present

## 2020-08-19 DIAGNOSIS — D649 Anemia, unspecified: Secondary | ICD-10-CM | POA: Diagnosis not present

## 2020-08-19 DIAGNOSIS — K2270 Barrett's esophagus without dysplasia: Secondary | ICD-10-CM | POA: Diagnosis not present

## 2020-08-19 LAB — CBC WITH DIFFERENTIAL/PLATELET
Basophils Absolute: 0 10*3/uL (ref 0.0–0.1)
Basophils Relative: 0.6 % (ref 0.0–3.0)
Eosinophils Absolute: 0.1 10*3/uL (ref 0.0–0.7)
Eosinophils Relative: 3.4 % (ref 0.0–5.0)
HCT: 29.9 % — ABNORMAL LOW (ref 36.0–46.0)
Hemoglobin: 10 g/dL — ABNORMAL LOW (ref 12.0–15.0)
Lymphocytes Relative: 19.8 % (ref 12.0–46.0)
Lymphs Abs: 0.6 10*3/uL — ABNORMAL LOW (ref 0.7–4.0)
MCHC: 33.6 g/dL (ref 30.0–36.0)
MCV: 79 fl (ref 78.0–100.0)
Monocytes Absolute: 0.2 10*3/uL (ref 0.1–1.0)
Monocytes Relative: 6.6 % (ref 3.0–12.0)
Neutro Abs: 2.1 10*3/uL (ref 1.4–7.7)
Neutrophils Relative %: 69.6 % (ref 43.0–77.0)
Platelets: 195 10*3/uL (ref 150.0–400.0)
RBC: 3.78 Mil/uL — ABNORMAL LOW (ref 3.87–5.11)
RDW: 14.3 % (ref 11.5–15.5)
WBC: 3 10*3/uL — ABNORMAL LOW (ref 4.0–10.5)

## 2020-08-19 LAB — BASIC METABOLIC PANEL
BUN: 19 mg/dL (ref 6–23)
CO2: 32 mEq/L (ref 19–32)
Calcium: 10.1 mg/dL (ref 8.4–10.5)
Chloride: 101 mEq/L (ref 96–112)
Creatinine, Ser: 1.51 mg/dL — ABNORMAL HIGH (ref 0.40–1.20)
GFR: 31.16 mL/min — ABNORMAL LOW (ref >60.00)
Glucose, Bld: 114 mg/dL — ABNORMAL HIGH (ref 70–99)
Potassium: 4.3 mEq/L (ref 3.5–5.1)
Sodium: 142 mEq/L (ref 135–145)

## 2020-08-19 LAB — HEPATIC FUNCTION PANEL
ALT: 25 U/L (ref 0–35)
AST: 30 U/L (ref 0–37)
Albumin: 4 g/dL (ref 3.5–5.2)
Alkaline Phosphatase: 67 U/L (ref 39–117)
Bilirubin, Direct: 0.1 mg/dL (ref 0.0–0.3)
Total Bilirubin: 0.5 mg/dL (ref 0.2–1.2)
Total Protein: 6.2 g/dL (ref 6.0–8.3)

## 2020-08-19 LAB — HEMOGLOBIN A1C: Hgb A1c MFr Bld: 6.1 % (ref 4.6–6.5)

## 2020-08-19 LAB — LIPID PANEL
Cholesterol: 112 mg/dL (ref 0–200)
HDL: 34.1 mg/dL — ABNORMAL LOW (ref >39.00)
LDL Cholesterol: 49 mg/dL (ref 0–99)
NonHDL: 77.51
Total CHOL/HDL Ratio: 3
Triglycerides: 145 mg/dL (ref 0.0–149.0)
VLDL: 29 mg/dL (ref 0.0–40.0)

## 2020-08-19 NOTE — Progress Notes (Signed)
Patient ID: Anita Ferrell, female   DOB: [birth year], 87 y.o.   MRN: [id number]   Subjective:    Patient ID: Anita Ferrell, female    DOB: 1932-12-17, 87 y.o.   MRN: [id number]  HPI This visit occurred during the SARS-CoV-2 public health emergency.  Safety protocols were in place, including screening questions prior to the visit, additional usage of staff PPE, and extensive cleaning of exam room while observing appropriate contact time as indicated for disinfecting solutions.  Patient here for a scheduled follow up. Here to follow up regarding her blood pressure, CKD, COPD and hypercholesterolemia.  Using trelegy.  Breathing stable.  Wearing oxygen at night.  No increased cough or congestion.  No chest pain.  No acid reflux.  No abdominal pain.  Bowels moving.  Had one month of physical therapy which helped left low back and hip pain.  Doing home exercise now.  Tries to stay active. Discussed bone density - osteoporosis.  Discussed treatment options.  Has had issues with acid reflux. Desires not to take oral bisphosphonates.    Past Medical History:  Diagnosis Date  . A-fib ([place])   . Allergy   . Asthma   . CHF (congestive heart failure) ([place])   . COPD (chronic obstructive pulmonary disease) ([place])   . GERD (gastroesophageal reflux disease)   . Hypercholesteremia   . Hypertension   . Mitral valve insufficiency   . Pulmonary hypertension (HCC)    Right Ventricular systolic pressure at 60 mm Hg by echo on july of 2011; Right heart cardic catherization revealed pulmonary artery pressusre of 27/10 with a mean of; Ventricular pressure 29/10 with pulmonary capillary wedge at 9  . TB (pulmonary tuberculosis)    treated with INH therapy    Past Surgical History:  Procedure Laterality Date  . CARDIAC CATHETERIZATION  10/2016   [facility]  . CATARACT EXTRACTION Bilateral 2008  . CHOLECYSTECTOMY    . TONSILLECTOMY  1964   Family History  Problem Relation Age of Onset  . Heart disease Mother    . Heart disease Father   . Hypertension Sister   . Heart disease Brother   . Breast cancer Maternal Aunt    Social History   Socioeconomic History  . Marital status: Widowed    Spouse name: Not on file  . Number of children: 3  . Years of education: Not on file  . Highest education level: Not on file  Occupational History  . Not on file  Tobacco Use  . Smoking status: Former Smoker    Packs/day: 1.00    Years: 40.00    Pack years: 40.00    Types: Cigarettes    Quit date: 08/17/1990    Years since quitting: 30.0  . Smokeless tobacco: Never Used  . Tobacco comment: Quit in 1992  Vaping Use  . Vaping Use: Never used  Substance and Sexual Activity  . Alcohol use: No    Alcohol/week: 0.0 standard drinks  . Drug use: No  . Sexual activity: Not Currently  Other Topics Concern  . Not on file  Social History Narrative  . Not on file   Social Determinants of Health   Financial Resource Strain: Not on file  Food Insecurity: Not on file  Transportation Needs: Not on file  Physical Activity: Not on file  Stress: Not on file  Social Connections: Not on file    Outpatient Encounter Medications as of 08/19/2020  Medication Sig  . famotidine (PEPCID) 20 MG tablet  Take 20 mg by mouth daily as needed for heartburn or indigestion.  Marland Kitchen acetaminophen (TYLENOL) 500 MG tablet Take 500 mg by mouth as needed.   Marland Kitchen albuterol (PROVENTIL) (2.5 MG/3ML) 0.083% nebulizer solution Take 3 mLs (2.5 mg total) by nebulization every 6 (six) hours as needed for wheezing or shortness of breath. DX: R06.02, R06.2  . albuterol (VENTOLIN HFA) 108 (90 Base) MCG/ACT inhaler Inhale 1-2 puffs into the lungs every 6 (six) hours as needed for wheezing or shortness of breath.  Marland Kitchen aspirin EC 81 MG tablet Take 81 mg by mouth daily.  Marland Kitchen azelastine (ASTELIN) 0.1 % nasal spray USE 1 SPRAY EACH NOSTRIL TWO TIMES DAILY  . Calcium Carbonate-Vitamin D 600-400 MG-UNIT tablet Take 1 tablet by mouth at bedtime.  . ferrous  sulfate 325 (65 FE) MG tablet Take 325 mg by mouth 2 (two) times daily with a meal.   . fexofenadine (ALLEGRA) 180 MG tablet Take 180 mg by mouth daily.  . furosemide (LASIX) 40 MG tablet TAKE 1 TABLET BY MOUTH EVERY OTHER DAY  . lansoprazole (PREVACID) 30 MG capsule Take 30 mg by mouth daily.  . meclizine (ANTIVERT) 25 MG tablet Take 25 mg by mouth as needed for dizziness.  . metoprolol tartrate (LOPRESSOR) 50 MG tablet TAKE 0.5 TABLETS (25 MG TOTAL) BY MOUTH 2 (TWO) TIMES DAILY.  . Multiple Vitamins-Minerals (CENTRUM SILVER PO) Take by mouth.  . OXYGEN Inhale 2 L into the lungs at bedtime.  . Probiotic Product (ALIGN PO) Take by mouth.  . rosuvastatin (CRESTOR) 10 MG tablet TAKE 1 TABLET (10 MG TOTAL) BY MOUTH DAILY. NEEDS APPT FOR FURTHER REFILL. SEE MYCHART MESSAGE  . spironolactone (ALDACTONE) 25 MG tablet TAKE 1/2 TABLET BY MOUTH EVERY DAY  . TRELEGY ELLIPTA 100-62.5-25 MCG/INH AEPB TAKE 1 PUFF BY MOUTH EVERY DAY   No facility-administered encounter medications on file as of 08/19/2020.    Review of Systems  Constitutional: Negative for appetite change and unexpected weight change.  HENT: Negative for congestion and sinus pressure.   Respiratory: Negative for cough and chest tightness.        Breathing stable.   Cardiovascular: Negative for chest pain, palpitations and leg swelling.  Gastrointestinal: Negative for abdominal pain, diarrhea, nausea and vomiting.  Genitourinary: Negative for difficulty urinating and dysuria.  Musculoskeletal: Negative for joint swelling and myalgias.       Left low back and left hip  - improved.   Skin: Negative for color change and rash.  Neurological: Negative for dizziness, light-headedness and headaches.  Psychiatric/Behavioral: Negative for agitation and dysphoric mood.       Objective:    Physical Exam Vitals reviewed.  Constitutional:      General: She is not in acute distress.    Appearance: Normal appearance.  HENT:     Head:  Normocephalic and atraumatic.     Right Ear: External ear normal.     Left Ear: External ear normal.  Eyes:     General: No scleral icterus.       Right eye: No discharge.        Left eye: No discharge.     Conjunctiva/sclera: Conjunctivae normal.  Neck:     Thyroid: No thyromegaly.  Cardiovascular:     Rate and Rhythm: Normal rate and regular rhythm.  Pulmonary:     Effort: No respiratory distress.     Breath sounds: Normal breath sounds. No wheezing.  Abdominal:     General: Bowel sounds are normal.  Palpations: Abdomen is soft.     Tenderness: There is no abdominal tenderness.  Musculoskeletal:        General: No swelling or tenderness.     Cervical back: Neck supple. No tenderness.  Lymphadenopathy:     Cervical: No cervical adenopathy.  Skin:    Findings: No erythema or rash.  Neurological:     Mental Status: She is alert.  Psychiatric:        Mood and Affect: Mood normal.        Behavior: Behavior normal.     BP 126/72   Pulse 96   Temp (!) 97.1 F (36.2 C) (Temporal)   Resp 16   Ht 5' 5" (1.651 m)   Wt 143 lb (64.9 kg)   SpO2 99%   BMI 23.80 kg/m  Wt Readings from Last 3 Encounters:  08/19/20 143 lb (64.9 kg)  05/20/20 146 lb 8 oz (66.5 kg)  04/29/20 147 lb (66.7 kg)     Lab Results  Component Value Date   WBC 3.0 (L) 08/19/2020   HGB 10.0 (L) 08/19/2020   HCT 29.9 (L) 08/19/2020   PLT 195.0 08/19/2020   GLUCOSE 114 (H) 08/19/2020   CHOL 112 08/19/2020   TRIG 145.0 08/19/2020   HDL 34.10 (L) 08/19/2020   LDLDIRECT 127.1 08/25/2012   LDLCALC 49 08/19/2020   ALT 25 08/19/2020   AST 30 08/19/2020   NA 142 08/19/2020   K 4.3 08/19/2020   CL 101 08/19/2020   CREATININE 1.51 (H) 08/19/2020   BUN 19 08/19/2020   CO2 32 08/19/2020   TSH 1.43 01/29/2020   HGBA1C 6.1 08/19/2020    DG Bone Density  Result Date: 03/05/2020 EXAM: DUAL X-RAY ABSORPTIOMETRY (DXA) FOR BONE MINERAL DENSITY IMPRESSION: Your patient Ravinia completed a BMD  test on 03/05/2020 using the [place] (software version: 14.10) manufactured by [employer]. The following summarizes the results of our evaluation. Technologist:VLM PATIENT BIOGRAPHICAL: Name: Anita Ferrell, Anita Ferrell Patient ID: [id number] Birth Date: [birth year] Height: 62.5 in. Gender: Female Exam Date: 03/05/2020 Weight: 147.0 lbs. Indications: Asthma, Caucasian, COPD, Height Loss, Postmenopausal Fractures: Treatments: Albuterol, calcium w/ vit D DENSITOMETRY RESULTS: Site         Region     Measured Date Measured Age WHO Classification Young Adult T-score BMD         %Change vs. Previous Significant Change (*) DualFemur Neck Left 03/05/2020 85.8 Osteopenia -2.2 0.728 g/cm2 - - Left Forearm Radius 33% 03/05/2020 85.8 Osteoporosis -4.5 0.480 g/cm2 - - ASSESSMENT: The BMD measured at Forearm Radius 33% is 0.480 g/cm2 with a T-score of -4.5. This patient is considered osteoporotic according to [facility]) criteria. Lumbar spine was not utilized due to advanced degenerative changes. The scan quality is good. World Pharmacologist [facility]) criteria for post-menopausal, Caucasian Women: Normal:                   T-score at or above -1 SD Osteopenia/low bone mass: T-score between -1 and -2.5 SD Osteoporosis:             T-score at or below -2.5 SD RECOMMENDATIONS: 1. All patients should optimize calcium and vitamin D intake. 2. Consider FDA-approved medical therapies in postmenopausal women and men aged 26 years and older, based on the following: a. A hip or vertebral(clinical or morphometric) fracture b. T-score < -2.5 at the femoral neck or spine after appropriate evaluation to exclude secondary causes c. Low bone  mass (T-score between -1.0 and -2.5 at the femoral neck or spine) and a 10-year probability of a hip fracture > 3% or a 10-year probability of a major osteoporosis-related fracture > 20% based on the [place]-adapted WHO algorithm 3. Clinician judgment and/or patient  preferences may indicate treatment for people with 10-year fracture probabilities above or below these levels FOLLOW-UP: People with diagnosed cases of osteoporosis or at high risk for fracture should have regular bone mineral density tests. For patients eligible for Medicare, routine testing is allowed once every 2 years. The testing frequency can be increased to one year for patients who have rapidly progressing disease, those who are receiving or discontinuing medical therapy to restore bone mass, or have additional risk factors. Electronically Signed   By: Marlaine Hind M.D.   On: 03/05/2020 15:20   MM 3D SCREEN BREAST BILATERAL  Result Date: 03/09/2020 CLINICAL DATA:  Screening. EXAM: DIGITAL SCREENING BILATERAL MAMMOGRAM WITH TOMO AND CAD COMPARISON:  Previous exam(s). ACR Breast Density Category b: There are scattered areas of fibroglandular density. FINDINGS: There are no findings suspicious for malignancy. Images were processed with CAD. IMPRESSION: No mammographic evidence of malignancy. A result letter of this screening mammogram will be mailed directly to the patient. RECOMMENDATION: Screening mammogram in one year. (Code:SM-B-01Y) BI-RADS CATEGORY  1: Negative. Electronically Signed   By: Dorise Bullion III M.D   On: 03/09/2020 08:23       Assessment & Plan:   Problem List Items Addressed This Visit    Anemia    Has been evaluated by hematology.  Follow cbc.       Relevant Orders   CBC with Differential/Platelet (Completed)   Barrett's esophagus    No upper symptoms.  On prevacid.       Relevant Orders   Ambulatory referral to Endocrinology   CKD (chronic kidney disease) stage 3, GFR 30-59 ml/min (HCC)    Previous ultrasound unremarkable.  Continue to avoid antiinflammatories.  Follow metabolic panel.       Relevant Orders   Ambulatory referral to Endocrinology   COPD with hypoxia ([place])    Breathing stable.  Continue trelegy.  Continue nocturnal oxygen.  Follow.        Essential hypertension - Primary    Continue aldactone and metoprolol.  Blood pressure doing well.  Follow pressures.  Follow metabolic panel.        Relevant Orders   Basic metabolic panel (Completed)   Urinalysis, Routine w reflex microscopic (Completed)   GERD (gastroesophageal reflux disease)    Continue prevacid.  Controlled.       Relevant Medications   famotidine (PEPCID) 20 MG tablet   Hypercholesterolemia    Continue crestor.  Low cholesterol diet and exercise.  Follow lipid panel and liver function tests.        Relevant Orders   Hepatic function panel (Completed)   Lipid panel (Completed)   Urinalysis, Routine w reflex microscopic (Completed)   Hyperglycemia    Low carb diet and exercise.  Follow met b and a1c.       Relevant Orders   Hemoglobin A1c (Completed)   Urinalysis, Routine w reflex microscopic (Completed)   Left hip pain    S/p PT.  Helped.  Continue home exercise.       Low back pain    Low back pain - better after therapy.  Continue home exercise.       Osteoporosis    Recent bone density - osteoporosis.  Discussed bone  density results.  Discussed treatment options.  Given GI history - acid reflux (upper symptoms) - wants to avoid oral bisphosphonates. Refer to endocrinology for treatment  - discussed CKD.       Relevant Orders   Ambulatory referral to Endocrinology   Pulmonary HTN ([place])    Followed by cardiology.            [name], MD

## 2020-08-20 ENCOUNTER — Other Ambulatory Visit (INDEPENDENT_AMBULATORY_CARE_PROVIDER_SITE_OTHER): Payer: Medicare Other

## 2020-08-20 ENCOUNTER — Encounter: Payer: Self-pay | Admitting: Internal Medicine

## 2020-08-20 ENCOUNTER — Other Ambulatory Visit: Payer: Self-pay | Admitting: Internal Medicine

## 2020-08-20 DIAGNOSIS — I10 Essential (primary) hypertension: Secondary | ICD-10-CM | POA: Diagnosis not present

## 2020-08-20 DIAGNOSIS — R739 Hyperglycemia, unspecified: Secondary | ICD-10-CM

## 2020-08-20 DIAGNOSIS — R319 Hematuria, unspecified: Secondary | ICD-10-CM

## 2020-08-20 DIAGNOSIS — E7800 Pure hypercholesterolemia, unspecified: Secondary | ICD-10-CM

## 2020-08-20 LAB — URINALYSIS, ROUTINE W REFLEX MICROSCOPIC
Bilirubin Urine: NEGATIVE
Ketones, ur: NEGATIVE
Nitrite: NEGATIVE
Specific Gravity, Urine: 1.025 (ref 1.000–1.030)
Urine Glucose: NEGATIVE
Urobilinogen, UA: 0.2 (ref 0.0–1.0)
pH: 5.5 (ref 5.0–8.0)

## 2020-08-20 NOTE — Assessment & Plan Note (Signed)
S/p PT.  Helped.  Continue home exercise.

## 2020-08-20 NOTE — Assessment & Plan Note (Signed)
Recent bone density - osteoporosis.  Discussed bone density results.  Discussed treatment options.  Given GI history - acid reflux (upper symptoms) - wants to avoid oral bisphosphonates. Refer to endocrinology for treatment  - discussed CKD.

## 2020-08-20 NOTE — Assessment & Plan Note (Signed)
Low back pain - better after therapy.  Continue home exercise.

## 2020-08-20 NOTE — Assessment & Plan Note (Signed)
Continue aldactone and metoprolol.  Blood pressure doing well.  Follow pressures.  Follow metabolic panel.

## 2020-08-20 NOTE — Assessment & Plan Note (Signed)
Has been evaluated by hematology.  Follow cbc.   

## 2020-08-20 NOTE — Assessment & Plan Note (Signed)
Low carb diet and exercise.  Follow met b and a1c.  

## 2020-08-20 NOTE — Assessment & Plan Note (Signed)
Continue prevacid.  Controlled.  

## 2020-08-20 NOTE — Assessment & Plan Note (Signed)
Breathing stable.  Continue trelegy.  Continue nocturnal oxygen.  Follow.  °

## 2020-08-20 NOTE — Progress Notes (Signed)
Order placed for f/u urinalysis 

## 2020-08-20 NOTE — Assessment & Plan Note (Signed)
Previous ultrasound unremarkable.  Continue to avoid antiinflammatories.  Follow metabolic panel.  °

## 2020-08-20 NOTE — Assessment & Plan Note (Signed)
Continue crestor.  Low cholesterol diet and exercise. Follow lipid panel and liver function tests.   

## 2020-08-20 NOTE — Assessment & Plan Note (Signed)
No upper symptoms.  On prevacid.  

## 2020-08-20 NOTE — Assessment & Plan Note (Signed)
Followed by cardiology 

## 2020-08-21 ENCOUNTER — Telehealth: Payer: Self-pay

## 2020-08-21 NOTE — Telephone Encounter (Signed)
LMTCB for lab results.  

## 2020-08-26 ENCOUNTER — Other Ambulatory Visit: Payer: Medicare Other

## 2020-09-03 ENCOUNTER — Other Ambulatory Visit (INDEPENDENT_AMBULATORY_CARE_PROVIDER_SITE_OTHER): Payer: Medicare Other

## 2020-09-03 DIAGNOSIS — R319 Hematuria, unspecified: Secondary | ICD-10-CM

## 2020-09-03 LAB — URINALYSIS, ROUTINE W REFLEX MICROSCOPIC
Bilirubin Urine: NEGATIVE
Ketones, ur: NEGATIVE
Nitrite: NEGATIVE
Specific Gravity, Urine: 1.01 (ref 1.000–1.030)
Total Protein, Urine: NEGATIVE
Urine Glucose: NEGATIVE
Urobilinogen, UA: 0.2 (ref 0.0–1.0)
pH: 6 (ref 5.0–8.0)

## 2020-09-05 ENCOUNTER — Other Ambulatory Visit: Payer: Self-pay | Admitting: Internal Medicine

## 2020-09-05 DIAGNOSIS — R319 Hematuria, unspecified: Secondary | ICD-10-CM

## 2020-09-05 NOTE — Progress Notes (Signed)
Order placed for urology referral.  

## 2020-09-12 ENCOUNTER — Other Ambulatory Visit: Payer: Self-pay | Admitting: Cardiovascular Disease

## 2020-09-14 ENCOUNTER — Other Ambulatory Visit: Payer: Self-pay | Admitting: Cardiovascular Disease

## 2020-09-25 ENCOUNTER — Ambulatory Visit (INDEPENDENT_AMBULATORY_CARE_PROVIDER_SITE_OTHER): Payer: Medicare Other | Admitting: Urology

## 2020-09-25 ENCOUNTER — Encounter: Payer: Self-pay | Admitting: Urology

## 2020-09-25 ENCOUNTER — Other Ambulatory Visit: Payer: Self-pay

## 2020-09-25 VITALS — BP 113/66 | HR 99 | Ht 64.5 in | Wt 142.0 lb

## 2020-09-25 DIAGNOSIS — R3129 Other microscopic hematuria: Secondary | ICD-10-CM

## 2020-09-25 DIAGNOSIS — R310 Gross hematuria: Secondary | ICD-10-CM | POA: Diagnosis not present

## 2020-09-25 LAB — URINALYSIS, COMPLETE
Bilirubin, UA: NEGATIVE
Glucose, UA: NEGATIVE
Ketones, UA: NEGATIVE
Nitrite, UA: NEGATIVE
Specific Gravity, UA: 1.01 (ref 1.005–1.030)
Urobilinogen, Ur: 0.2 mg/dL (ref 0.2–1.0)
pH, UA: 5.5 (ref 5.0–7.5)

## 2020-09-25 LAB — MICROSCOPIC EXAMINATION

## 2020-09-25 NOTE — Progress Notes (Signed)
   09/25/2020 10:35 AM   [place] 1932-08-03 [id number]  Reason for visit: Follow up asymptomatic microscopic hematuria  HPI: I saw Ms. Skoog back in clinic today for asymptomatic microscopic hematuria.  She is an 87 year old female with a number of comorbidities including CKD who has a long history of asymptomatic microscopic hematuria.  She was originally worked up by Dr. Jacqlyn Larsen around 15 years ago with no abnormalities found.  Most recently I saw her in January 2020 when a renal ultrasound and cystoscopy were benign.  She has a 40-pack-year smoking history and quit 35 years ago,  no other carcinogenic exposures.  She denies any gross hematuria or recurrent UTIs.  She was re- referred back for persistent low-grade microscopic hematuria with 11-20 RBCs and 3-6 RBCs seen on urinalysis in May 2022.  Notably, there is also some mild pyuria with 3-6 WBCs and 7-10 WBCs, but this was not sent for culture.  She denies any urinary symptoms of dysuria, pelvic pain.  She has baseline frequency secondary to diuretic use.  No recent imaging to review.  Urinalysis today pending, will send for culture.  We again reviewed the AUA guidelines regarding microscopic hematuria evaluation.  Using shared decision making, we opted for a repeat renal/bladder ultrasound, and to defer cystoscopy at this time since it was benign in 2020.  She cannot get a contrasted CT secondary to CKD, as well as the national contrast shortage.  Will call with urine culture results and renal ultrasound results    Billey Co, [place] [address] [place], [place] [address] [phone]

## 2020-09-28 LAB — CULTURE, URINE COMPREHENSIVE

## 2020-09-30 DIAGNOSIS — M810 Age-related osteoporosis without current pathological fracture: Secondary | ICD-10-CM | POA: Diagnosis not present

## 2020-10-01 LAB — MYCOPLASMA / UREAPLASMA CULTURE
Mycoplasma hominis Culture: NEGATIVE
Ureaplasma urealyticum: NEGATIVE

## 2020-10-02 ENCOUNTER — Telehealth: Payer: Self-pay

## 2020-10-02 NOTE — Telephone Encounter (Signed)
Mychart notification sent 

## 2020-10-02 NOTE — Telephone Encounter (Signed)
-----   Message from Billey Co, MD sent at 10/02/2020  9:14 AM EDT ----- No infection on urine culture, keep follow-up for renal ultrasound as scheduled  Nickolas Madrid, MD 10/02/2020

## 2020-10-09 ENCOUNTER — Other Ambulatory Visit: Payer: Self-pay | Admitting: Cardiovascular Disease

## 2020-10-12 ENCOUNTER — Other Ambulatory Visit: Payer: Self-pay | Admitting: Pulmonary Disease

## 2020-10-21 ENCOUNTER — Encounter: Payer: Self-pay | Admitting: Cardiovascular Disease

## 2020-10-21 ENCOUNTER — Ambulatory Visit (INDEPENDENT_AMBULATORY_CARE_PROVIDER_SITE_OTHER): Payer: Medicare Other | Admitting: Cardiovascular Disease

## 2020-10-21 ENCOUNTER — Other Ambulatory Visit: Payer: Self-pay

## 2020-10-21 VITALS — BP 130/68 | HR 83 | Ht 64.0 in | Wt 142.5 lb

## 2020-10-21 DIAGNOSIS — I10 Essential (primary) hypertension: Secondary | ICD-10-CM

## 2020-10-21 NOTE — Patient Instructions (Addendum)
Medication Instructions:  No changes, please continue your current medications   If you need a refill on your cardiac medications before your next appointment, please call your pharmacy.   Lab work: No new labs needed  Testing/Procedures: No new testing needed  Follow-Up: At [facility], you and your health needs are our priority.  As part of our continuing mission to provide you with exceptional heart care, we have created designated Provider Care Teams.  These Care Teams include your primary Cardiologist (physician) and Advanced Practice Providers (APPs -  Physician Assistants and Nurse Practitioners) who all work together to provide you with the care you need, when you need it.  You will need a follow up appointment in 12 months  Providers on your designated Care Team:   Christopher Berge, NP Ryan Dunn, PA-C Jacquelyn Visser, PA-C Cadence Furth, PA-C  COVID-19 Vaccine Information can be found at: [email or web address] For questions related to vaccine distribution or appointments, please email [email or web address] or call [phone].    

## 2020-10-21 NOTE — Progress Notes (Signed)
Date:  10/21/2020   ID:  Anita Ferrell, DOB 10-28-1932, MRN [id number]  Patient Location:  [address] [place] Fer[place] [id number]   Provider location:   [facility], [place] office  PCP:  [name], MD  Cardiologist:  Arvid [facility]   Chief Complaint  Patient presents with   12 month follow up     Patient c/o shortness of breath with exertion. Medications reviewed by the patient verbally.    History of Present Illness:    [name] is a 87 y.o. female past medical history of Pulmonary hypertension HTN,  COPD, smoker 87 yo quit 56 Moderate Mitral Regurg,(resolved on repeat echo)  mild to moderate tricuspid valve regurgitation  chronic shortness of breath with exertion.  2015  PFT reports of an FEV1 of 1.1 L.   Nonobstructive coronary disease by catheterization July 2018 Moderately elevated right heart pressures with normal PCWP of 8, by right heart catheterization , on Lasix every other day Echo in 08/2015: EF 35% Mildly reduced LV function, LVEF approximately 45% by TEE at [facility] Ejection fraction 50% in March 2021, right heart pressures estimated to be normal Who presents for mitral valve regurgitation, shortness of breath, pulm HTN  LOV 09/2019 Hip and back pain  Scheduled to u/s kidney  asymptomatic microscopic hematuria. Followed by urology  No edema Denies any chest pain concerning for angina No regular exercise program  Started on Trelegy Ellipta  Lasix QOD Cr 1.4 to 1.5, stable x 3 years  Oxygen for sleeping, 2 liters   emergency room February 2020 with shortness of breath Chronic shortness of breath from COPD pulmonary hypertension, takes Lasix every other day  Echocardiogram March 2021 Ejection fraction 50% in March 2021, right heart pressures estimated to be normal No significant mitral valve regurgitation  Lab work reviewed total cholesterol 112 LDL 49 Hemoglobin A1c 6.1  Takes extra lasix for weight  gain, rare  Goal weight <145 Today is 142  EKG personally reviewed by myself on todays visit Shows normal sinus rhythm with rate 85 bpm, PVCs, no significant ST-T wave changes No change  RHC, at [facility] in 2018,  1.  Cardiac index 2.2 L/m/m2 at rest with PCWP 8 mmHg. 2.  Moderate pulmonary hypertension in the absence of elevated PCWP, PVR 6.0 Wu. 3.  Non obstructive CAD medical therapy of moderate MR and severe COPD   Prior CV studies:   The following studies were reviewed today:  Right Ventricular systolic pressure at 60 mm Hg by echo on july of 2011; Right heart cardic catherization revealed pulmonary artery pressusre of 27/10 with a mean of; Ventricular pressure 29/10 with pulmonary capillary wedge at 9  Records from [place] reviewed with her in detail on today's visit Cath 10/2016 1.  Cardiac index 2.2 L/m/m2 at rest with PCWP 8 mmHg. 2.  Moderate pulmonary hypertension in the absence of elevated PCWP, PVR 6.0 Wu. 3.  Non obstructive CAD Plan medical therapy of moderate MR and severe COPD    Previously followed by Dr Aline Brochure at [facility].   ECHO - mild to moderate left ventricular dysfunction with moderate to severe MR and moderate TR.     TEE:  1. Moderate Functional MR (EROA by PISA = 0.1)   2. Moderate TR   3. Mild PR   4. No interatrial septal communication   5. Mildly reduced LV function, LVEF approximately 45%   Lab work reviewed with creatinine 1.4  Past Medical History:  Diagnosis Date   A-fib ([place])    Allergy    Asthma    CHF (congestive heart failure) (HCC)    COPD (chronic obstructive pulmonary disease) (HCC)    GERD (gastroesophageal reflux disease)    Hypercholesteremia    Hypertension    Mitral valve insufficiency    Pulmonary hypertension (HCC)    Right Ventricular systolic pressure at 60 mm Hg by echo on july of 2011; Right heart cardic catherization revealed pulmonary artery pressusre of 27/10 with a mean of; Ventricular pressure 29/10 with pulmonary  capillary wedge at 9   TB (pulmonary tuberculosis)    treated with INH therapy    Past Surgical History:  Procedure Laterality Date   CARDIAC CATHETERIZATION  10/2016   [facility]   CATARACT EXTRACTION Bilateral 2008   CHOLECYSTECTOMY     TONSILLECTOMY  1964     Current Meds  Medication Sig   acetaminophen (TYLENOL) 500 MG tablet Take 500 mg by mouth as needed.    albuterol (PROVENTIL) (2.5 MG/3ML) 0.083% nebulizer solution Take 3 mLs (2.5 mg total) by nebulization every 6 (six) hours as needed for wheezing or shortness of breath. DX: R06.02, R06.2   albuterol (VENTOLIN HFA) 108 (90 Base) MCG/ACT inhaler Inhale 1-2 puffs into the lungs every 6 (six) hours as needed for wheezing or shortness of breath.   aspirin EC 81 MG tablet Take 81 mg by mouth daily.   azelastine (ASTELIN) 0.1 % nasal spray USE 1 SPRAY EACH NOSTRIL TWO TIMES DAILY   Calcium Carbonate-Vitamin D 600-400 MG-UNIT tablet Take 1 tablet by mouth at bedtime.   Cranberry 500 MG TABS Take 500 mg by mouth daily at 2 PM.   famotidine (PEPCID) 20 MG tablet Take 20 mg by mouth daily as needed for heartburn or indigestion.   ferrous sulfate 325 (65 FE) MG tablet Take 325 mg by mouth 2 (two) times daily with a meal.    fexofenadine (ALLEGRA) 180 MG tablet Take 180 mg by mouth daily.   furosemide (LASIX) 40 MG tablet Take 1 tablet (40 mg total) by mouth every other day. PLEASE KEEP YOUR SCHEDULED APPOINTMENT WITH DR. Rockey Situ IN JULY   lansoprazole (PREVACID) 30 MG capsule Take 30 mg by mouth daily.   meclizine (ANTIVERT) 25 MG tablet Take 25 mg by mouth as needed for dizziness.   metoprolol tartrate (LOPRESSOR) 50 MG tablet TAKE 0.5 TABLETS BY MOUTH 2 TIMES DAILY.   Multiple Vitamins-Minerals (CENTRUM SILVER PO) Take by mouth.   OXYGEN Inhale 2 L into the lungs at bedtime.   Probiotic Product (ALIGN PO) Take by mouth.   rosuvastatin (CRESTOR) 10 MG tablet TAKE 1 TABLET (10 MG TOTAL) BY MOUTH DAILY. NEEDS APPT FOR FURTHER REFILL. SEE  MYCHART MESSAGE   spironolactone (ALDACTONE) 25 MG tablet TAKE 1/2 TABLET BY MOUTH EVERY DAY   TRELEGY ELLIPTA 100-62.5-25 MCG/INH AEPB TAKE 1 PUFF BY MOUTH EVERY DAY     Allergies:   Penicillin g and Penicillins   Social History   Tobacco Use   Smoking status: Former    Packs/day: 1.00    Years: 40.00    Pack years: 40.00    Types: Cigarettes    Quit date: 08/17/1990    Years since quitting: 30.2   Smokeless tobacco: Never   Tobacco comments:    Quit in 1992  Vaping Use   Vaping Use: Never used  Substance Use Topics   Alcohol use: No    Alcohol/week: 0.0 standard  drinks   Drug use: No     Family Hx: The patient's family history includes Breast cancer in her maternal aunt; Heart disease in her brother, father, and mother; Hypertension in her sister.  ROS:   Please see the history of present illness.    Review of Systems  Constitutional: Negative.   HENT: Negative.    Respiratory:  Positive for shortness of breath.   Cardiovascular: Negative.   Gastrointestinal: Negative.   Musculoskeletal: Negative.   Neurological: Negative.   Psychiatric/Behavioral: Negative.    All other systems reviewed and are negative.   Labs/Other Tests and Data Reviewed:    Recent Labs: 01/29/2020: TSH 1.43 08/19/2020: ALT 25; BUN 19; Creatinine, Ser 1.51; Hemoglobin 10.0; Platelets 195.0; Potassium 4.3; Sodium 142   Recent Lipid Panel Lab Results  Component Value Date/Time   CHOL 112 08/19/2020 11:08 AM   TRIG 145.0 08/19/2020 11:08 AM   HDL 34.10 (L) 08/19/2020 11:08 AM   CHOLHDL 3 08/19/2020 11:08 AM   LDLCALC 49 08/19/2020 11:08 AM   LDLDIRECT 127.1 08/25/2012 11:03 AM    Wt Readings from Last 3 Encounters:  10/21/20 142 lb 8 oz (64.6 kg)  09/25/20 142 lb (64.4 kg)  08/19/20 143 lb (64.9 kg)     Exam:    Vital Signs: Vital signs may also be detailed in the HPI BP 130/68 (BP Location: Left Arm, Patient Position: Sitting, Cuff Size: Normal)   Pulse 83   Ht 5\' 4"  (1.626 m)    Wt 142 lb 8 oz (64.6 kg)   SpO2 96%   BMI 24.46 kg/m   Constitutional:  oriented to person, place, and time. No distress.  HENT:  Head: Grossly normal Eyes:  no discharge. No scleral icterus.  Neck: No JVD, no carotid bruits  Cardiovascular: Regular rate and rhythm, no murmurs appreciated Pulmonary/Chest: Clear to auscultation bilaterally, no wheezes  There is scattered Rales Abdominal: Soft.  no distension.  no tenderness.  Musculoskeletal: Normal range of motion Neurological:  normal muscle tone. Coordination normal. No atrophy Skin: Skin warm and dry Psychiatric: normal affect, pleasant   ASSESSMENT & PLAN:    Pulmonary HTN (HCC) Severe underlying lung disease Breathing stable 2 L at nighttime only Weight stable Lasix every other day, renal function stable  COPD with hypoxia (HCC) Uses nebulizer, inhalers, stable  no recent COPD exacerbation er visit 05/2018 Managed by pulmonary Recommended walking program for conditioning  Moderate mitral regurgitation Moderate MR previously seen on echocardiogram, appeared to resolve on echo March 2021 No new clinical symptoms  CKD (chronic kidney disease) stage 3, GFR 30-59 ml/min (HCC) No changes made to her medications Stable renal function creatinine 1.4 up to 1.5  Essential hypertension Blood pressure is well controlled on today's visit. No changes made to the medications.  Hypercholesterolemia Excellent numbers, discussed with her, no changes   Total encounter time more than 25 minutes  Greater than 50% was spent in counseling and coordination of care with the patient   Signed, Ida Rogue, MD  10/21/2020 11:48 AM    [place] [facility] [id number], [place], [place] [address]

## 2020-10-23 ENCOUNTER — Telehealth: Payer: Self-pay | Admitting: Internal Medicine

## 2020-10-23 NOTE — Telephone Encounter (Signed)
Left message for patient to call back and schedule Medicare Annual Wellness Visit (AWV) in office.   If not able to come in office, please offer to do virtually or by telephone.   Last AWV:02/21/2019  Please schedule at anytime with Nurse Health Advisor.

## 2020-10-29 ENCOUNTER — Ambulatory Visit
Admission: RE | Admit: 2020-10-29 | Discharge: 2020-10-29 | Disposition: A | Discharge status: Home / Self Care | Payer: Medicare Other | Source: Ambulatory Visit | Attending: Urology | Admitting: Urology

## 2020-10-29 ENCOUNTER — Other Ambulatory Visit: Payer: Self-pay

## 2020-10-29 DIAGNOSIS — N200 Calculus of kidney: Secondary | ICD-10-CM | POA: Diagnosis not present

## 2020-10-29 DIAGNOSIS — R3129 Other microscopic hematuria: Secondary | ICD-10-CM | POA: Diagnosis not present

## 2020-10-29 DIAGNOSIS — N281 Cyst of kidney, acquired: Secondary | ICD-10-CM | POA: Diagnosis not present

## 2020-11-03 ENCOUNTER — Telehealth: Payer: Self-pay

## 2020-11-03 DIAGNOSIS — N200 Calculus of kidney: Secondary | ICD-10-CM

## 2020-11-03 NOTE — Telephone Encounter (Signed)
-----   Message from Billey Co, MD sent at 11/03/2020 10:39 AM EDT ----- Non blocking stone on the left side that may be the cause of her microscopic hematuria, no other worrisome findings. Recommend follow up in 9 months with Kub prior to eval change in stone size  Nickolas Madrid, MD 11/03/2020

## 2020-11-03 NOTE — Telephone Encounter (Signed)
Called pt informed her of the information below. Pt gave verbal understanding. Appt scheduled. KUB ordered.

## 2020-11-10 ENCOUNTER — Other Ambulatory Visit: Payer: Self-pay | Admitting: Pulmonary Disease

## 2020-11-12 DIAGNOSIS — M810 Age-related osteoporosis without current pathological fracture: Secondary | ICD-10-CM | POA: Diagnosis not present

## 2020-11-19 ENCOUNTER — Telehealth: Payer: Self-pay | Admitting: Internal Medicine

## 2020-11-19 NOTE — Telephone Encounter (Signed)
Left message with patient's son for patient to call back and schedule Medicare Annual Wellness Visit (AWV) in office.   If not able to come in office, please offer to do virtually or by telephone.   Last AWV:02/21/2019  Please schedule at anytime with Nurse Health Advisor.

## 2020-11-27 MED ORDER — ALBUTEROL SULFATE (2.5 MG/3ML) 0.083% IN NEBU
INHALATION_SOLUTION | RESPIRATORY_TRACT | 5 refills | Status: DC
Start: 2020-11-27 — End: 2021-12-07

## 2020-11-27 NOTE — Telephone Encounter (Signed)
Called and spoke with pharmacy to see what the issue was with albuterol nebulizer medication. Was advised they needed an ICD code on the prescription. New RX has been sent over. Nothing further needed at this time.

## 2020-12-09 ENCOUNTER — Other Ambulatory Visit: Payer: Self-pay | Admitting: Cardiovascular Disease

## 2020-12-15 ENCOUNTER — Ambulatory Visit: Payer: Medicare Other | Admitting: Pulmonary Disease

## 2020-12-30 ENCOUNTER — Other Ambulatory Visit: Payer: Self-pay

## 2020-12-30 ENCOUNTER — Ambulatory Visit (INDEPENDENT_AMBULATORY_CARE_PROVIDER_SITE_OTHER): Payer: Medicare Other | Admitting: Internal Medicine

## 2020-12-30 VITALS — BP 132/72 | HR 100 | Temp 97.9°F | Resp 16 | Ht 64.0 in | Wt 142.4 lb

## 2020-12-30 DIAGNOSIS — Z23 Encounter for immunization: Secondary | ICD-10-CM

## 2020-12-30 DIAGNOSIS — K2100 Gastro-esophageal reflux disease with esophagitis, without bleeding: Secondary | ICD-10-CM | POA: Diagnosis not present

## 2020-12-30 DIAGNOSIS — E7800 Pure hypercholesterolemia, unspecified: Secondary | ICD-10-CM | POA: Diagnosis not present

## 2020-12-30 DIAGNOSIS — G4734 Idiopathic sleep related nonobstructive alveolar hypoventilation: Secondary | ICD-10-CM | POA: Diagnosis not present

## 2020-12-30 DIAGNOSIS — D649 Anemia, unspecified: Secondary | ICD-10-CM | POA: Diagnosis not present

## 2020-12-30 DIAGNOSIS — I10 Essential (primary) hypertension: Secondary | ICD-10-CM

## 2020-12-30 DIAGNOSIS — Z1231 Encounter for screening mammogram for malignant neoplasm of breast: Secondary | ICD-10-CM | POA: Diagnosis not present

## 2020-12-30 DIAGNOSIS — R739 Hyperglycemia, unspecified: Secondary | ICD-10-CM | POA: Diagnosis not present

## 2020-12-30 DIAGNOSIS — M810 Age-related osteoporosis without current pathological fracture: Secondary | ICD-10-CM | POA: Diagnosis not present

## 2020-12-30 DIAGNOSIS — N183 Chronic kidney disease, stage 3 unspecified: Secondary | ICD-10-CM | POA: Diagnosis not present

## 2020-12-30 DIAGNOSIS — I272 Pulmonary hypertension, unspecified: Secondary | ICD-10-CM

## 2020-12-30 DIAGNOSIS — J449 Chronic obstructive pulmonary disease, unspecified: Secondary | ICD-10-CM | POA: Diagnosis not present

## 2020-12-30 DIAGNOSIS — R0902 Hypoxemia: Secondary | ICD-10-CM

## 2020-12-30 LAB — CBC WITH DIFFERENTIAL/PLATELET
Basophils Absolute: 0 10*3/uL (ref 0.0–0.1)
Basophils Relative: 0.4 % (ref 0.0–3.0)
Eosinophils Absolute: 0.1 10*3/uL (ref 0.0–0.7)
Eosinophils Relative: 3.2 % (ref 0.0–5.0)
HCT: 31.8 % — ABNORMAL LOW (ref 36.0–46.0)
Hemoglobin: 10.1 g/dL — ABNORMAL LOW (ref 12.0–15.0)
Lymphocytes Relative: 23.2 % (ref 12.0–46.0)
Lymphs Abs: 0.8 10*3/uL (ref 0.7–4.0)
MCHC: 31.9 g/dL (ref 30.0–36.0)
MCV: 80.8 fl (ref 78.0–100.0)
Monocytes Absolute: 0.2 10*3/uL (ref 0.1–1.0)
Monocytes Relative: 7.5 % (ref 3.0–12.0)
Neutro Abs: 2.1 10*3/uL (ref 1.4–7.7)
Neutrophils Relative %: 65.7 % (ref 43.0–77.0)
Platelets: 174 10*3/uL (ref 150.0–400.0)
RBC: 3.93 Mil/uL (ref 3.87–5.11)
RDW: 15 % (ref 11.5–15.5)
WBC: 3.2 10*3/uL — ABNORMAL LOW (ref 4.0–10.5)

## 2020-12-30 LAB — BASIC METABOLIC PANEL
BUN: 17 mg/dL (ref 6–23)
CO2: 31 mEq/L (ref 19–32)
Calcium: 9.5 mg/dL (ref 8.4–10.5)
Chloride: 101 mEq/L (ref 96–112)
Creatinine, Ser: 1.49 mg/dL — ABNORMAL HIGH (ref 0.40–1.20)
GFR: 31.58 mL/min — ABNORMAL LOW (ref >60.00)
Glucose, Bld: 110 mg/dL — ABNORMAL HIGH (ref 70–99)
Potassium: 4.7 mEq/L (ref 3.5–5.1)
Sodium: 140 mEq/L (ref 135–145)

## 2020-12-30 LAB — FERRITIN: Ferritin: 25.9 ng/mL (ref 10.0–291.0)

## 2020-12-30 LAB — LIPID PANEL
Cholesterol: 120 mg/dL (ref 0–200)
HDL: 41.5 mg/dL (ref >39.00)
LDL Cholesterol: 54 mg/dL (ref 0–99)
NonHDL: 78.07
Total CHOL/HDL Ratio: 3
Triglycerides: 120 mg/dL (ref 0.0–149.0)
VLDL: 24 mg/dL (ref 0.0–40.0)

## 2020-12-30 LAB — HEPATIC FUNCTION PANEL
ALT: 16 U/L (ref 0–35)
AST: 24 U/L (ref 0–37)
Albumin: 4.1 g/dL (ref 3.5–5.2)
Alkaline Phosphatase: 67 U/L (ref 39–117)
Bilirubin, Direct: 0.1 mg/dL (ref 0.0–0.3)
Total Bilirubin: 0.5 mg/dL (ref 0.2–1.2)
Total Protein: 6.3 g/dL (ref 6.0–8.3)

## 2020-12-30 LAB — TSH: TSH: 1.37 u[IU]/mL (ref 0.35–5.50)

## 2020-12-30 LAB — HEMOGLOBIN A1C: Hgb A1c MFr Bld: 6 % (ref 4.6–6.5)

## 2020-12-30 MED ORDER — ROSUVASTATIN CALCIUM 10 MG PO TABS: 10.0000 mg | ORAL_TABLET | Freq: Every day | ORAL | 1 refills | Status: DC

## 2020-12-30 MED ORDER — AZELASTINE HCL 0.1 % NA SOLN: NASAL | 5 refills | Status: DC

## 2020-12-30 NOTE — Progress Notes (Signed)
Patient ID: Anita Ferrell, female   DOB: 08-07-1932, 87 y.o.   MRN: [id number]   Subjective:    Patient ID: Anita Ferrell, female    DOB: Nov 13, 1932, 87 y.o.   MRN: [id number]  This visit occurred during the SARS-CoV-2 public health emergency.  Safety protocols were in place, including screening questions prior to the visit, additional usage of staff PPE, and extensive cleaning of exam room while observing appropriate contact time as indicated for disinfecting solutions.   Patient here for a scheduled follow up.   Chief Complaint  Patient presents with   Hypertension   Hyperlipidemia   .   HPI She is here to follow up regarding her blood pressure and cholesterol.  Also has pulmonary hypertension with severe underlying lung disease/copd with hypoxia.  Using oxygen.  Taking lasix as directed.  Breathing overall stable.  No chest pain.  Eating.  No  nausea or vomiting.  Bowels moving.  Previous LLQ pain.  Gone now.  No pain now.  Overall feels things are stable.     Past Medical History:  Diagnosis Date   A-fib ([place])    Allergy    Asthma    CHF (congestive heart failure) (HCC)    COPD (chronic obstructive pulmonary disease) (HCC)    GERD (gastroesophageal reflux disease)    Hypercholesteremia    Hypertension    Mitral valve insufficiency    Pulmonary hypertension (HCC)    Right Ventricular systolic pressure at 60 mm Hg by echo on july of 2011; Right heart cardic catherization revealed pulmonary artery pressusre of 27/10 with a mean of; Ventricular pressure 29/10 with pulmonary capillary wedge at 9   TB (pulmonary tuberculosis)    treated with INH therapy    Past Surgical History:  Procedure Laterality Date   CARDIAC CATHETERIZATION  10/2016   [facility]   CATARACT EXTRACTION Bilateral 2008   CHOLECYSTECTOMY     TONSILLECTOMY  1964   Family History  Problem Relation Age of Onset   Heart disease Mother    Heart disease Father    Hypertension Sister    Heart disease Brother     Breast cancer Maternal Aunt    Social History   Socioeconomic History   Marital status: Widowed    Spouse name: Not on file   Number of children: 3   Years of education: Not on file   Highest education level: Not on file  Occupational History   Not on file  Tobacco Use   Smoking status: Former    Packs/day: 1.00    Years: 40.00    Pack years: 40.00    Types: Cigarettes    Quit date: 08/17/1990    Years since quitting: 30.4   Smokeless tobacco: Never   Tobacco comments:    Quit in 1992  Vaping Use   Vaping Use: Never used  Substance and Sexual Activity   Alcohol use: No    Alcohol/week: 0.0 standard drinks   Drug use: No   Sexual activity: Not Currently  Other Topics Concern   Not on file  Social History Narrative   Not on file   Social Determinants of Health   Financial Resource Strain: Not on file  Food Insecurity: Not on file  Transportation Needs: Not on file  Physical Activity: Not on file  Stress: Not on file  Social Connections: Not on file    Review of Systems  Constitutional:  Negative for appetite change and unexpected weight change.  HENT:  Negative for congestion and sinus pressure.   Respiratory:  Negative for chest tightness.        Breathing stable.    Cardiovascular:  Negative for chest pain, palpitations and leg swelling.  Gastrointestinal:  Negative for abdominal pain, diarrhea, nausea and vomiting.  Genitourinary:  Negative for difficulty urinating and dysuria.  Musculoskeletal:  Negative for joint swelling and myalgias.  Skin:  Negative for color change and rash.  Neurological:  Negative for dizziness, light-headedness and headaches.  Psychiatric/Behavioral:  Negative for agitation and dysphoric mood.       Objective:     BP 132/72   Pulse 100   Temp 97.9 F (36.6 C)   Resp 16   Ht $R'5\' 4"'Yd$  (1.626 m)   Wt 142 lb 6.4 oz (64.6 kg)   SpO2 97%   BMI 24.44 kg/m  Wt Readings from Last 3 Encounters:  12/30/20 142 lb 6.4 oz (64.6 kg)   10/21/20 142 lb 8 oz (64.6 kg)  09/25/20 142 lb (64.4 kg)    Physical Exam Vitals reviewed.  Constitutional:      General: She is not in acute distress.    Appearance: Normal appearance.  HENT:     Head: Normocephalic and atraumatic.     Right Ear: External ear normal.     Left Ear: External ear normal.  Eyes:     General: No scleral icterus.       Right eye: No discharge.        Left eye: No discharge.     Conjunctiva/sclera: Conjunctivae normal.  Neck:     Thyroid: No thyromegaly.  Cardiovascular:     Rate and Rhythm: Normal rate and regular rhythm.  Pulmonary:     Effort: No respiratory distress.     Breath sounds: Normal breath sounds. No wheezing.  Abdominal:     General: Bowel sounds are normal.     Palpations: Abdomen is soft.     Tenderness: There is no abdominal tenderness.  Musculoskeletal:        General: No swelling or tenderness.     Cervical back: Neck supple. No tenderness.  Lymphadenopathy:     Cervical: No cervical adenopathy.  Skin:    Findings: No erythema or rash.  Neurological:     Mental Status: She is alert.  Psychiatric:        Mood and Affect: Mood normal.        Behavior: Behavior normal.     Outpatient Encounter Medications as of 12/30/2020  Medication Sig   acetaminophen (TYLENOL) 500 MG tablet Take 500 mg by mouth as needed.    albuterol (PROVENTIL) (2.5 MG/3ML) 0.083% nebulizer solution INHALE CONTENTS OF 1 VIAL VIA NEBULIZER EVERY 6 HOURS AS NEEDED FOR WHEEZE OR SHORTNESS OF BREATH   albuterol (VENTOLIN HFA) 108 (90 Base) MCG/ACT inhaler Inhale 1-2 puffs into the lungs every 6 (six) hours as needed for wheezing or shortness of breath.   aspirin EC 81 MG tablet Take 81 mg by mouth daily.   azelastine (ASTELIN) 0.1 % nasal spray Use in each nostril as directed   Calcium Carbonate-Vitamin D 600-400 MG-UNIT tablet Take 1 tablet by mouth at bedtime.   Cranberry 500 MG TABS Take 500 mg by mouth daily at 2 PM.   famotidine (PEPCID) 20 MG  tablet Take 20 mg by mouth daily as needed for heartburn or indigestion.   ferrous sulfate 325 (65 FE) MG tablet Take 325 mg by mouth 2 (two) times daily with a  meal.    fexofenadine (ALLEGRA) 180 MG tablet Take 180 mg by mouth daily.   furosemide (LASIX) 40 MG tablet Take 0.5 tablets (20 mg total) by mouth every other day.   lansoprazole (PREVACID) 30 MG capsule Take 30 mg by mouth daily.   meclizine (ANTIVERT) 25 MG tablet Take 25 mg by mouth as needed for dizziness.   metoprolol tartrate (LOPRESSOR) 50 MG tablet TAKE 0.5 TABLETS BY MOUTH 2 TIMES DAILY.   Multiple Vitamins-Minerals (CENTRUM SILVER PO) Take by mouth.   OXYGEN Inhale 2 L into the lungs at bedtime.   Probiotic Product (ALIGN PO) Take by mouth.   rosuvastatin (CRESTOR) 10 MG tablet Take 1 tablet (10 mg total) by mouth daily. Needs appt for further refill. See mychart message   spironolactone (ALDACTONE) 25 MG tablet TAKE 1/2 TABLET BY MOUTH EVERY DAY   TRELEGY ELLIPTA 100-62.5-25 MCG/INH AEPB TAKE 1 PUFF BY MOUTH EVERY DAY   [DISCONTINUED] azelastine (ASTELIN) 0.1 % nasal spray USE 1 SPRAY EACH NOSTRIL TWO TIMES DAILY   [DISCONTINUED] rosuvastatin (CRESTOR) 10 MG tablet TAKE 1 TABLET (10 MG TOTAL) BY MOUTH DAILY. NEEDS APPT FOR FURTHER REFILL. SEE [facility]   No facility-administered encounter medications on file as of 12/30/2020.     Lab Results  Component Value Date   WBC 3.2 (L) 12/30/2020   HGB 10.1 (L) 12/30/2020   HCT 31.8 (L) 12/30/2020   PLT 174.0 12/30/2020   GLUCOSE 110 (H) 12/30/2020   CHOL 120 12/30/2020   TRIG 120.0 12/30/2020   HDL 41.50 12/30/2020   LDLDIRECT 127.1 08/25/2012   LDLCALC 54 12/30/2020   ALT 16 12/30/2020   AST 24 12/30/2020   NA 140 12/30/2020   K 4.7 12/30/2020   CL 101 12/30/2020   CREATININE 1.49 (H) 12/30/2020   BUN 17 12/30/2020   CO2 31 12/30/2020   TSH 1.37 12/30/2020   HGBA1C 6.0 12/30/2020    Ultrasound renal complete  Result Date: 11/01/2020 CLINICAL DATA:   Microhematuria EXAM: RENAL / URINARY TRACT ULTRASOUND COMPLETE COMPARISON:  None. FINDINGS: Right Kidney: Renal measurements: 8.5 x 4.6 x 9.1 cm = volume: 89.1 mL. Echogenicity within normal limits. No mass or hydronephrosis visualized. Left Kidney: Renal measurements: 7.2 x 3.6 x 4.0 cm = volume: 54.4 mL. Contains 2 cysts measuring 1.4 and 1.9 cm in the lower pole. Contains a 13 mm shadowing stone. No hydronephrosis. Bladder: Appears normal for degree of bladder distention. Other: None. IMPRESSION: 1. 13 mm nonobstructive stone in the left kidney. Two cysts in the left kidney. 2. No other significant abnormalities. Electronically Signed   By: Dorise Bullion III M.D   On: 11/01/2020 17:04       Assessment & Plan:   Problem List Items Addressed This Visit     Anemia    Has been evaluated by hematology.  Follow cbc.       Relevant Orders   CBC with Differential/Platelet (Completed)   Ferritin (Completed)   CKD (chronic kidney disease) stage 3, GFR 30-59 ml/min (HCC)    Previous ultrasound unremarkable.  Continue to avoid antiinflammatories.  Follow metabolic panel.       COPD with hypoxia (HCC)    Breathing stable.  Continue trelegy.  Continue nocturnal oxygen.  Follow.       Relevant Medications   azelastine (ASTELIN) 0.1 % nasal spray   Essential hypertension - Primary    Continue aldactone and metoprolol.  Blood pressure as outlined.  On recheck improved. Follow pressures.  Follow metabolic panel.        Relevant Medications   rosuvastatin (CRESTOR) 10 MG tablet   Other Relevant Orders   Basic metabolic panel (Completed)   GERD (gastroesophageal reflux disease)    Continue prevacid.  Controlled.       Hypercholesterolemia    Continue crestor.  Low cholesterol diet and exercise.  Follow lipid panel and liver function tests.        Relevant Medications   rosuvastatin (CRESTOR) 10 MG tablet   Other Relevant Orders   Lipid panel (Completed)   Hepatic function panel  (Completed)   TSH (Completed)   Hyperglycemia    Low carb diet and exercise.  Follow met b and a1c.       Relevant Orders   Hemoglobin A1c (Completed)   Nocturnal hypoxia    Continue oxygen at night.       Osteoporosis    prolia 09/2020.        Pulmonary HTN (HCC)    Continue oxygen.  Followed by pulmonary and cardiology.       Relevant Medications   rosuvastatin (CRESTOR) 10 MG tablet   Other Visit Diagnoses     Visit for screening mammogram       Relevant Orders   MM 3D SCREEN BREAST BILATERAL   Need for immunization against influenza       Relevant Orders   Flu Vaccine QUAD High Dose(Fluad) (Completed)        [name], MD

## 2021-01-04 ENCOUNTER — Encounter: Payer: Self-pay | Admitting: Internal Medicine

## 2021-01-04 NOTE — Assessment & Plan Note (Signed)
Low carb diet and exercise.  Follow met b and a1c.  

## 2021-01-04 NOTE — Assessment & Plan Note (Signed)
Has been evaluated by hematology.  Follow cbc.   

## 2021-01-04 NOTE — Assessment & Plan Note (Signed)
Continue crestor.  Low cholesterol diet and exercise. Follow lipid panel and liver function tests.   

## 2021-01-04 NOTE — Assessment & Plan Note (Signed)
Continue oxygen at night.   

## 2021-01-04 NOTE — Assessment & Plan Note (Signed)
Continue aldactone and metoprolol.  Blood pressure as outlined.  On recheck improved. Follow pressures.  Follow metabolic panel.

## 2021-01-04 NOTE — Assessment & Plan Note (Signed)
prolia 09/2020.

## 2021-01-04 NOTE — Assessment & Plan Note (Signed)
Continue prevacid.  Controlled.  

## 2021-01-04 NOTE — Assessment & Plan Note (Signed)
Previous ultrasound unremarkable.  Continue to avoid antiinflammatories.  Follow metabolic panel.  °

## 2021-01-04 NOTE — Assessment & Plan Note (Signed)
Continue oxygen.  Followed by pulmonary and cardiology.

## 2021-01-04 NOTE — Assessment & Plan Note (Signed)
Breathing stable.  Continue trelegy.  Continue nocturnal oxygen.  Follow.  °

## 2021-01-08 ENCOUNTER — Other Ambulatory Visit: Payer: Self-pay | Admitting: Cardiovascular Disease

## 2021-01-12 ENCOUNTER — Other Ambulatory Visit: Payer: Self-pay

## 2021-01-12 ENCOUNTER — Encounter: Payer: Self-pay | Admitting: Primary Care

## 2021-01-12 ENCOUNTER — Ambulatory Visit (INDEPENDENT_AMBULATORY_CARE_PROVIDER_SITE_OTHER): Payer: Medicare Other | Admitting: Primary Care

## 2021-01-12 DIAGNOSIS — R0902 Hypoxemia: Secondary | ICD-10-CM | POA: Diagnosis not present

## 2021-01-12 DIAGNOSIS — G4734 Idiopathic sleep related nonobstructive alveolar hypoventilation: Secondary | ICD-10-CM

## 2021-01-12 DIAGNOSIS — J449 Chronic obstructive pulmonary disease, unspecified: Secondary | ICD-10-CM

## 2021-01-12 NOTE — Patient Instructions (Signed)
Nice seeing you today Ms Beever. You looked very well today, no changes needed   Recommendations: Continue Trelegy 1 puff daily in the morning (rinse mouth after use) Use albuterol rescue inhaler 2 puffs every 4-6 hours as needed for breakthrough shortness of breath/wheezing Call when you need refills   Follow-up: 6-12 months with Dr. Patsey Berthold or sooner if needed

## 2021-01-12 NOTE — Assessment & Plan Note (Addendum)
-   Patient symptoms are stable. No recent exacerbations or hospitalizations. Rare SABA use. CAT score 14. Continue Trelegy 121mcg one puff daily and prn albuterol 2 puffs q 6 hours for breakthrough sob/wheezing. She is up to date with influenza and pneumonia vaccines. FU in 6-12 months or sooner if needed.

## 2021-01-12 NOTE — Progress Notes (Signed)
@Patient  ID: Anita Ferrell, female    DOB: 04/15/1933, 87 y.o.   MRN: [id number]  Chief Complaint  Patient presents with   Follow-up    Pt states no concerns    Referring provider: [name], MD  HPI: 87 year old female, former smoker quit in 1992 (40 pack year hx). PMH significant for moderate COPD/emphysema, lung nodules (no further follow-up needed), hx positive PPD s/p nine months INH. Patient of Dr. Patsey Berthold. Last seen on 04/30/19.   Previous [facility] pulmonary encounter: 04/30/19- Dr. Patsey Berthold  87 year old remote former smoker follows up for scheduled visit.  Doing well.  Using Trelegy Ellipta 100/25, 1 inhalation daily.  Feels she is doing well with this medication.  Rare use of albuterol.  On nocturnal oxygen at 2 L/min feels refreshed when she wakes up in the morning.  Compliant.  No overt complaint today no chest pain, fevers, chills or sweats.  No orthopnea or paroxysmal nocturnal dyspnea.  No lower extremity edema.  Overall she feels well and looks well.  01/12/2021- Interim hx  Patient presents today for 6 month follow-up COPD/emphysema. Since her last visit she was started on prolia for osteoporosis in her left hip. She is doing well today, no acute complaints. No changes in her respiratory symptoms. She is complaint with Trelegy 162mcg daily, very seldomly needs to use albuterol rescue inhaler. She has an occasionally cough with clear mucus. She can do most all ADLs but at a slow pace. Her son does majority of the heavy duties. She sleeps well at night, uses 2L oxygen. CAT score 14. Denies f/c/s, shortness of breath, chest tightness or purulent mucus.    DATA: CT chest 07/29/15:Significant decrease in size of the previously demonstrated right lower lobe nodule, compatible with a benign process. No significant change in multiple additional sub cm nodules and calcified granulomata in both lungs, compatible with a benign process. Mild changes of COPD and chronic bronchitis.  Resolved infection in the lingula and left lower lobe PFTs 07/29/15: moderate obstruction, normal lung volumes, mild reduction DLCO. TEE 10/19/16: LVEF 45%. Moderate mitral regurgitation, moderate TR [facility] 10/25/16: Nonobstructive coronary disease. Moderate pulmonary hypertension (systolic 50 mmHg, mean 30 mmHg) 2D echo 07/17/2019: LVEF 55 to 60%, indeterminate diastolic parameters.  Normal pulmonary artery systolic pressure.  Tricuspid regurgitation mild to moderate.  No aortic stenosis.  Allergies  Allergen Reactions   Penicillin G Swelling   Penicillins Swelling and Other (See Comments)    Has patient had a PCN reaction causing immediate rash, facial/tongue/throat swelling, SOB or lightheadedness with hypotension: Yes Has patient had a PCN reaction causing severe rash involving mucus membranes or skin necrosis: No Has patient had a PCN reaction that required hospitalization No Has patient had a PCN reaction occurring within the last 10 years: No If all of the above answers are "NO", then may proceed with Cephalosporin use.    Immunization History  Administered Date(s) Administered   Fluad Quad(high Dose 65+) 12/15/2018, 12/30/2020   Influenza Split 01/15/2020   Influenza, High Dose Seasonal PF 01/13/2016, 01/25/2017, 01/10/2018   Influenza,inj,Quad PF,6+ Mos 12/26/2012, 01/12/2014, 01/14/2015   Moderna Sars-Covid-2 Vaccination 05/02/2019, 05/30/2019, 02/14/2020, 08/11/2020   Pneumococcal Conjugate-13 09/02/2014   Pneumococcal Polysaccharide-23 11/14/2012   Tdap 11/14/2012    Past Medical History:  Diagnosis Date   A-fib [facility])    Allergy    Asthma    CHF (congestive heart failure) (HCC)    COPD (chronic obstructive pulmonary disease) (HCC)    GERD (gastroesophageal reflux  disease)    Hypercholesteremia    Hypertension    Mitral valve insufficiency    Pulmonary hypertension (HCC)    Right Ventricular systolic pressure at 60 mm Hg by echo on july of 2011; Right heart cardic  catherization revealed pulmonary artery pressusre of 27/10 with a mean of; Ventricular pressure 29/10 with pulmonary capillary wedge at 9   TB (pulmonary tuberculosis)    treated with INH therapy     Tobacco History: Social History   Tobacco Use  Smoking Status Former   Packs/day: 1.00   Years: 40.00   Pack years: 40.00   Types: Cigarettes   Quit date: 08/17/1990   Years since quitting: 30.4  Smokeless Tobacco Never  Tobacco Comments   Quit in 1992   Counseling given: Not Answered Tobacco comments: Quit in 1992   Outpatient Medications Prior to Visit  Medication Sig Dispense Refill   acetaminophen (TYLENOL) 500 MG tablet Take 500 mg by mouth as needed.      albuterol (PROVENTIL) (2.5 MG/3ML) 0.083% nebulizer solution INHALE CONTENTS OF 1 VIAL VIA NEBULIZER EVERY 6 HOURS AS NEEDED FOR WHEEZE OR SHORTNESS OF BREATH 375 mL 5   albuterol (VENTOLIN HFA) 108 (90 Base) MCG/ACT inhaler Inhale 1-2 puffs into the lungs every 6 (six) hours as needed for wheezing or shortness of breath. 18 g 5   aspirin EC 81 MG tablet Take 81 mg by mouth daily.     azelastine (ASTELIN) 0.1 % nasal spray Use in each nostril as directed 30 mL 5   Calcium Carbonate-Vitamin D 600-400 MG-UNIT tablet Take 1 tablet by mouth at bedtime.     Cranberry 500 MG TABS Take 500 mg by mouth daily at 2 PM.     famotidine (PEPCID) 20 MG tablet Take 20 mg by mouth daily as needed for heartburn or indigestion.     ferrous sulfate 325 (65 FE) MG tablet Take 325 mg by mouth 2 (two) times daily with a meal.      fexofenadine (ALLEGRA) 180 MG tablet Take 180 mg by mouth daily.     furosemide (LASIX) 40 MG tablet Take 0.5 tablets (20 mg total) by mouth every other day. 45 tablet 3   lansoprazole (PREVACID) 30 MG capsule Take 30 mg by mouth daily.     meclizine (ANTIVERT) 25 MG tablet Take 25 mg by mouth as needed for dizziness.     metoprolol tartrate (LOPRESSOR) 50 MG tablet TAKE 1/2 TABLET BY MOUTH TWICE A DAY 90 tablet 3    Multiple Vitamins-Minerals (CENTRUM SILVER PO) Take by mouth.     OXYGEN Inhale 2 L into the lungs at bedtime.     Probiotic Product (ALIGN PO) Take by mouth.     rosuvastatin (CRESTOR) 10 MG tablet Take 1 tablet (10 mg total) by mouth daily. Needs appt for further refill. See mychart message 90 tablet 1   spironolactone (ALDACTONE) 25 MG tablet TAKE 1/2 TABLET BY MOUTH EVERY DAY 45 tablet 3   TRELEGY ELLIPTA 100-62.5-25 MCG/INH AEPB TAKE 1 PUFF BY MOUTH EVERY DAY 60 each 6   No facility-administered medications prior to visit.    Review of Systems  Review of Systems  Constitutional: Negative.   HENT: Negative.    Respiratory:  Positive for cough. Negative for shortness of breath and wheezing.   Cardiovascular: Negative.     Physical Exam  BP 126/72 (BP Location: Left Arm, Patient Position: Sitting, Cuff Size: Small)   Pulse 97   Temp (!) 96.9  F (36.1 C)   Ht 5\' 4"  (1.626 m)   Wt 143 lb (64.9 kg)   SpO2 95%   BMI 24.55 kg/m  Physical Exam Constitutional:      Appearance: Normal appearance.  HENT:     Head: Normocephalic and atraumatic.     Mouth/Throat:     Mouth: Mucous membranes are moist.     Pharynx: Oropharynx is clear.  Cardiovascular:     Rate and Rhythm: Normal rate and regular rhythm.  Pulmonary:     Effort: Pulmonary effort is normal.     Breath sounds: Normal breath sounds.     Comments: CTA Musculoskeletal:        General: Normal range of motion.     Cervical back: Normal range of motion and neck supple.  Skin:    General: Skin is warm and dry.  Neurological:     General: No focal deficit present.     Mental Status: She is alert and oriented to person, place, and time. Mental status is at baseline.  Psychiatric:        Mood and Affect: Mood normal.        Behavior: Behavior normal.        Thought Content: Thought content normal.        Judgment: Judgment normal.     Lab Results:  CBC    Component Value Date/Time   WBC 3.2 (L) 12/30/2020  1234   RBC 3.93 12/30/2020 1234   HGB 10.1 (L) 12/30/2020 1234   HGB 12.1 12/08/2011 1040   HCT 31.8 (L) 12/30/2020 1234   HCT 38.3 12/08/2011 1040   PLT 174.0 12/30/2020 1234   PLT 192 12/08/2011 1040   MCV 80.8 12/30/2020 1234   MCV 85 12/08/2011 1040   MCH 27.1 05/27/2018 0407   MCHC 31.9 12/30/2020 1234   RDW 15.0 12/30/2020 1234   RDW 14.6 (H) 12/08/2011 1040   LYMPHSABS 0.8 12/30/2020 1234   LYMPHSABS 1.4 12/08/2011 1040   MONOABS 0.2 12/30/2020 1234   MONOABS 0.3 12/08/2011 1040   EOSABS 0.1 12/30/2020 1234   EOSABS 0.1 12/08/2011 1040   BASOSABS 0.0 12/30/2020 1234   BASOSABS 0.0 12/08/2011 1040    BMET    Component Value Date/Time   NA 140 12/30/2020 1234   NA 142 05/11/2018 0000   NA 143 06/23/2011 1041   K 4.7 12/30/2020 1234   K 4.5 06/23/2011 1041   CL 101 12/30/2020 1234   CL 106 06/23/2011 1041   CO2 31 12/30/2020 1234   CO2 30 06/23/2011 1041   GLUCOSE 110 (H) 12/30/2020 1234   GLUCOSE 99 06/23/2011 1041   BUN 17 12/30/2020 1234   BUN 21 05/11/2018 0000   BUN 13 06/23/2011 1041   CREATININE 1.49 (H) 12/30/2020 1234   CREATININE 1.41 (H) 06/28/2016 1107   CALCIUM 9.5 12/30/2020 1234   CALCIUM 9.9 06/23/2011 1041   GFRNONAA 32 (L) 05/27/2018 0407   GFRNONAA 52 (L) 06/23/2011 1041   GFRAA 37 (L) 05/27/2018 0407   GFRAA >60 06/23/2011 1041    BNP    Component Value Date/Time   BNP 63.0 05/27/2018 0407    ProBNP No results found for: PROBNP  Imaging: No results found.   Assessment & Plan:   COPD with hypoxia ([place]) - Patient symptoms are stable. No recent exacerbations or hospitalizations. Rare SABA use. CAT score 14. Continue Trelegy 198mcg one puff daily and prn albuterol 2 puffs q 6 hours for breakthrough sob/wheezing. She is  up to date with influenza and pneumonia vaccines. FU in 6-12 months or sooner if needed.   Nocturnal hypoxia - Continues to use and benefit from 2L oxygen at night while sleeping. No daytime requirements       Martyn Ehrich, NP 01/12/2021

## 2021-01-12 NOTE — Assessment & Plan Note (Signed)
-   Continues to use and benefit from 2L oxygen at night while sleeping. No daytime requirements

## 2021-01-23 NOTE — Progress Notes (Signed)
Agree with the details of the visit as noted by Elizabeth Walsh, NP.  C. Laura Krithik Mapel, MD [facility] 

## 2021-01-26 DIAGNOSIS — Z23 Encounter for immunization: Secondary | ICD-10-CM | POA: Diagnosis not present

## 2021-03-06 ENCOUNTER — Ambulatory Visit
Admission: RE | Admit: 2021-03-06 | Discharge: 2021-03-06 | Disposition: A | Discharge status: Home / Self Care | Payer: Medicare Other | Source: Ambulatory Visit | Attending: Internal Medicine | Admitting: Internal Medicine

## 2021-03-06 ENCOUNTER — Other Ambulatory Visit: Payer: Self-pay

## 2021-03-06 DIAGNOSIS — Z1231 Encounter for screening mammogram for malignant neoplasm of breast: Secondary | ICD-10-CM | POA: Diagnosis not present

## 2021-04-15 DIAGNOSIS — M810 Age-related osteoporosis without current pathological fracture: Secondary | ICD-10-CM | POA: Diagnosis not present

## 2021-05-04 ENCOUNTER — Other Ambulatory Visit: Payer: Self-pay

## 2021-05-04 ENCOUNTER — Ambulatory Visit (INDEPENDENT_AMBULATORY_CARE_PROVIDER_SITE_OTHER): Payer: Medicare Other | Admitting: Internal Medicine

## 2021-05-04 ENCOUNTER — Ambulatory Visit (INDEPENDENT_AMBULATORY_CARE_PROVIDER_SITE_OTHER): Payer: Medicare Other

## 2021-05-04 ENCOUNTER — Encounter: Payer: Self-pay | Admitting: Internal Medicine

## 2021-05-04 VITALS — BP 122/70 | HR 106 | Ht 64.02 in | Wt 141.6 lb

## 2021-05-04 DIAGNOSIS — M5441 Lumbago with sciatica, right side: Secondary | ICD-10-CM | POA: Diagnosis not present

## 2021-05-04 DIAGNOSIS — I10 Essential (primary) hypertension: Secondary | ICD-10-CM | POA: Diagnosis not present

## 2021-05-04 DIAGNOSIS — J449 Chronic obstructive pulmonary disease, unspecified: Secondary | ICD-10-CM

## 2021-05-04 DIAGNOSIS — N183 Chronic kidney disease, stage 3 unspecified: Secondary | ICD-10-CM

## 2021-05-04 DIAGNOSIS — Z0000 Encounter for general adult medical examination without abnormal findings: Secondary | ICD-10-CM | POA: Diagnosis not present

## 2021-05-04 DIAGNOSIS — M810 Age-related osteoporosis without current pathological fracture: Secondary | ICD-10-CM

## 2021-05-04 DIAGNOSIS — E7800 Pure hypercholesterolemia, unspecified: Secondary | ICD-10-CM

## 2021-05-04 DIAGNOSIS — R0902 Hypoxemia: Secondary | ICD-10-CM

## 2021-05-04 DIAGNOSIS — K2100 Gastro-esophageal reflux disease with esophagitis, without bleeding: Secondary | ICD-10-CM | POA: Diagnosis not present

## 2021-05-04 DIAGNOSIS — I272 Pulmonary hypertension, unspecified: Secondary | ICD-10-CM | POA: Diagnosis not present

## 2021-05-04 DIAGNOSIS — M545 Low back pain, unspecified: Secondary | ICD-10-CM | POA: Diagnosis not present

## 2021-05-04 DIAGNOSIS — R739 Hyperglycemia, unspecified: Secondary | ICD-10-CM | POA: Diagnosis not present

## 2021-05-04 DIAGNOSIS — D649 Anemia, unspecified: Secondary | ICD-10-CM | POA: Diagnosis not present

## 2021-05-04 LAB — BASIC METABOLIC PANEL
BUN: 21 mg/dL (ref 6–23)
CO2: 31 mEq/L (ref 19–32)
Calcium: 9.7 mg/dL (ref 8.4–10.5)
Chloride: 101 mEq/L (ref 96–112)
Creatinine, Ser: 1.63 mg/dL — ABNORMAL HIGH (ref 0.40–1.20)
GFR: 28.29 mL/min — ABNORMAL LOW (ref >60.00)
Glucose, Bld: 110 mg/dL — ABNORMAL HIGH (ref 70–99)
Potassium: 4.5 mEq/L (ref 3.5–5.1)
Sodium: 141 mEq/L (ref 135–145)

## 2021-05-04 LAB — LIPID PANEL
Cholesterol: 108 mg/dL (ref 0–200)
HDL: 35.6 mg/dL — ABNORMAL LOW (ref >39.00)
LDL Cholesterol: 43 mg/dL (ref 0–99)
NonHDL: 72.75
Total CHOL/HDL Ratio: 3
Triglycerides: 149 mg/dL (ref 0.0–149.0)
VLDL: 29.8 mg/dL (ref 0.0–40.0)

## 2021-05-04 LAB — CBC WITH DIFFERENTIAL/PLATELET
Basophils Absolute: 0 10*3/uL (ref 0.0–0.1)
Basophils Relative: 0.7 % (ref 0.0–3.0)
Eosinophils Absolute: 0.1 10*3/uL (ref 0.0–0.7)
Eosinophils Relative: 2.7 % (ref 0.0–5.0)
HCT: 31.9 % — ABNORMAL LOW (ref 36.0–46.0)
Hemoglobin: 10.2 g/dL — ABNORMAL LOW (ref 12.0–15.0)
Lymphocytes Relative: 20.6 % (ref 12.0–46.0)
Lymphs Abs: 0.8 10*3/uL (ref 0.7–4.0)
MCHC: 31.9 g/dL (ref 30.0–36.0)
MCV: 81.2 fl (ref 78.0–100.0)
Monocytes Absolute: 0.3 10*3/uL (ref 0.1–1.0)
Monocytes Relative: 7.4 % (ref 3.0–12.0)
Neutro Abs: 2.5 10*3/uL (ref 1.4–7.7)
Neutrophils Relative %: 68.6 % (ref 43.0–77.0)
Platelets: 185 10*3/uL (ref 150.0–400.0)
RBC: 3.93 Mil/uL (ref 3.87–5.11)
RDW: 16.1 % — ABNORMAL HIGH (ref 11.5–15.5)
WBC: 3.7 10*3/uL — ABNORMAL LOW (ref 4.0–10.5)

## 2021-05-04 LAB — HEPATIC FUNCTION PANEL
ALT: 24 U/L (ref 0–35)
AST: 32 U/L (ref 0–37)
Albumin: 4.2 g/dL (ref 3.5–5.2)
Alkaline Phosphatase: 70 U/L (ref 39–117)
Bilirubin, Direct: 0.1 mg/dL (ref 0.0–0.3)
Total Bilirubin: 0.6 mg/dL (ref 0.2–1.2)
Total Protein: 6.5 g/dL (ref 6.0–8.3)

## 2021-05-04 LAB — HEMOGLOBIN A1C: Hgb A1c MFr Bld: 6.1 % (ref 4.6–6.5)

## 2021-05-04 LAB — FERRITIN: Ferritin: 44.1 ng/mL (ref 10.0–291.0)

## 2021-05-04 MED ORDER — METHYLPREDNISOLONE 4 MG PO TBPK: ORAL_TABLET | ORAL | 0 refills | Status: DC

## 2021-05-04 NOTE — Assessment & Plan Note (Signed)
Physical today 05/04/21.  Mammogram 03/06/21 - Birads I.  Colonoscopy 04/2014.

## 2021-05-04 NOTE — Progress Notes (Signed)
Patient ID: Anita Ferrell, female   DOB: 1932/07/07, 88 y.o.   MRN: [id number]   Subjective:    Patient ID: Anita Ferrell, female    DOB: 02-06-1933, 88 y.o.   MRN: [id number]  This visit occurred during the SARS-CoV-2 public health emergency.  Safety protocols were in place, including screening questions prior to the visit, additional usage of staff PPE, and extensive cleaning of exam room while observing appropriate contact time as indicated for disinfecting solutions.     HPI History of osteoporosis, hypertension, GERD and COPD.  She comes in today to f/u on these issues as well as for a complete physical exam.  Her main complaint is low back pain and pain into right buttock and down right leg.  Has been persistent.  States when gets out of bed, will take 3-4 steps and will notice increased pain.  Has to sit down when occurs.  Limits activity.  Taking tylenol.  Reports breathing is stable.  No increased cough or congestion.  No chest pain.  No abdominal pain.  Bowels moving.  Seeing Dr Honor Junes.  Planning to receive prolia.     Past Medical History:  Diagnosis Date   A-fib ([place])    Allergy    Asthma    CHF (congestive heart failure) (HCC)    COPD (chronic obstructive pulmonary disease) (HCC)    GERD (gastroesophageal reflux disease)    Hypercholesteremia    Hypertension    Mitral valve insufficiency    Pulmonary hypertension (HCC)    Right Ventricular systolic pressure at 60 mm Hg by echo on july of 2011; Right heart cardic catherization revealed pulmonary artery pressusre of 27/10 with a mean of; Ventricular pressure 29/10 with pulmonary capillary wedge at 9   TB (pulmonary tuberculosis)    treated with INH therapy    Past Surgical History:  Procedure Laterality Date   CARDIAC CATHETERIZATION  10/2016   [facility]   CATARACT EXTRACTION Bilateral 2008   CHOLECYSTECTOMY     TONSILLECTOMY  1964   Family History  Problem Relation Age of Onset   Heart disease Mother    Heart  disease Father    Hypertension Sister    Heart disease Brother    Breast cancer Maternal Aunt    Social History   Socioeconomic History   Marital status: Widowed    Spouse name: Not on file   Number of children: 3   Years of education: Not on file   Highest education level: Not on file  Occupational History   Not on file  Tobacco Use   Smoking status: Former    Packs/day: 1.00    Years: 40.00    Pack years: 40.00    Types: Cigarettes    Quit date: 08/17/1990    Years since quitting: 30.7   Smokeless tobacco: Never   Tobacco comments:    Quit in 1992  Vaping Use   Vaping Use: Never used  Substance and Sexual Activity   Alcohol use: No    Alcohol/week: 0.0 standard drinks   Drug use: No   Sexual activity: Not Currently  Other Topics Concern   Not on file  Social History Narrative   Not on file   Social Determinants of Health   Financial Resource Strain: Not on file  Food Insecurity: Not on file  Transportation Needs: Not on file  Physical Activity: Not on file  Stress: Not on file  Social Connections: Not on file     Review of  Systems  Constitutional:  Negative for appetite change and unexpected weight change.  HENT:  Negative for congestion and sinus pressure.   Respiratory:  Negative for cough and chest tightness.        Breathing stable.   Cardiovascular:  Negative for chest pain, palpitations and leg swelling.  Gastrointestinal:  Negative for abdominal pain, diarrhea, nausea and vomiting.  Genitourinary:  Negative for difficulty urinating and dysuria.  Musculoskeletal:  Positive for back pain. Negative for myalgias.  Skin:  Negative for color change and rash.  Neurological:  Negative for dizziness, light-headedness and headaches.  Psychiatric/Behavioral:  Negative for agitation and dysphoric mood.       Objective:     BP 122/70    Pulse (!) 106    Ht 5' 4.02" (1.626 m)    Wt 141 lb 9.6 oz (64.2 kg)    SpO2 95%    BMI 24.29 kg/m  Wt Readings from  Last 3 Encounters:  05/04/21 141 lb 9.6 oz (64.2 kg)  01/12/21 143 lb (64.9 kg)  12/30/20 142 lb 6.4 oz (64.6 kg)    Physical Exam Vitals reviewed.  Constitutional:      General: She is not in acute distress.    Appearance: Normal appearance.  HENT:     Head: Normocephalic and atraumatic.     Right Ear: External ear normal.     Left Ear: External ear normal.  Eyes:     General: No scleral icterus.       Right eye: No discharge.        Left eye: No discharge.     Conjunctiva/sclera: Conjunctivae normal.  Neck:     Thyroid: No thyromegaly.  Cardiovascular:     Rate and Rhythm: Normal rate and regular rhythm.  Pulmonary:     Effort: No respiratory distress.     Breath sounds: Normal breath sounds. No wheezing.  Abdominal:     General: Bowel sounds are normal.     Palpations: Abdomen is soft.     Tenderness: There is no abdominal tenderness.  Musculoskeletal:        General: No swelling or tenderness.     Cervical back: Neck supple. No tenderness.  Lymphadenopathy:     Cervical: No cervical adenopathy.  Skin:    Findings: No erythema or rash.  Neurological:     Mental Status: She is alert.  Psychiatric:        Mood and Affect: Mood normal.        Behavior: Behavior normal.     Outpatient Encounter Medications as of 05/04/2021  Medication Sig   acetaminophen (TYLENOL) 500 MG tablet Take 500 mg by mouth as needed.    albuterol (PROVENTIL) (2.5 MG/3ML) 0.083% nebulizer solution INHALE CONTENTS OF 1 VIAL VIA NEBULIZER EVERY 6 HOURS AS NEEDED FOR WHEEZE OR SHORTNESS OF BREATH   albuterol (VENTOLIN HFA) 108 (90 Base) MCG/ACT inhaler Inhale 1-2 puffs into the lungs every 6 (six) hours as needed for wheezing or shortness of breath.   aspirin EC 81 MG tablet Take 81 mg by mouth daily.   azelastine (ASTELIN) 0.1 % nasal spray Use in each nostril as directed   Calcium Carbonate-Vitamin D 600-400 MG-UNIT tablet Take 1 tablet by mouth at bedtime.   Cranberry 500 MG TABS Take 500  mg by mouth daily at 2 PM.   famotidine (PEPCID) 20 MG tablet Take 20 mg by mouth daily as needed for heartburn or indigestion.   ferrous sulfate 325 (65 FE) MG  tablet Take 325 mg by mouth 2 (two) times daily with a meal.    fexofenadine (ALLEGRA) 180 MG tablet Take 180 mg by mouth daily.   furosemide (LASIX) 40 MG tablet Take 0.5 tablets (20 mg total) by mouth every other day.   lansoprazole (PREVACID) 30 MG capsule Take 30 mg by mouth daily.   meclizine (ANTIVERT) 25 MG tablet Take 25 mg by mouth as needed for dizziness.   methylPREDNISolone (MEDROL DOSEPAK) 4 MG TBPK tablet Medrol dosepack 6 day taper.  Take as directed.   metoprolol tartrate (LOPRESSOR) 50 MG tablet TAKE 1/2 TABLET BY MOUTH TWICE A DAY   Multiple Vitamins-Minerals (CENTRUM SILVER PO) Take by mouth.   OXYGEN Inhale 2 L into the lungs at bedtime.   Probiotic Product (ALIGN PO) Take by mouth.   rosuvastatin (CRESTOR) 10 MG tablet Take 1 tablet (10 mg total) by mouth daily. Needs appt for further refill. See mychart message   spironolactone (ALDACTONE) 25 MG tablet TAKE 1/2 TABLET BY MOUTH EVERY DAY   TRELEGY ELLIPTA 100-62.5-25 MCG/INH AEPB TAKE 1 PUFF BY MOUTH EVERY DAY   No facility-administered encounter medications on file as of 05/04/2021.     Lab Results  Component Value Date   WBC 3.7 (L) 05/04/2021   HGB 10.2 (L) 05/04/2021   HCT 31.9 (L) 05/04/2021   PLT 185.0 05/04/2021   GLUCOSE 110 (H) 05/04/2021   CHOL 108 05/04/2021   TRIG 149.0 05/04/2021   HDL 35.60 (L) 05/04/2021   LDLDIRECT 127.1 08/25/2012   LDLCALC 43 05/04/2021   ALT 24 05/04/2021   AST 32 05/04/2021   NA 141 05/04/2021   K 4.5 05/04/2021   CL 101 05/04/2021   CREATININE 1.63 (H) 05/04/2021   BUN 21 05/04/2021   CO2 31 05/04/2021   TSH 1.37 12/30/2020   HGBA1C 6.1 05/04/2021    MM 3D SCREEN BREAST BILATERAL  Result Date: 03/06/2021 CLINICAL DATA:  Screening. EXAM: DIGITAL SCREENING BILATERAL MAMMOGRAM WITH TOMOSYNTHESIS AND CAD  TECHNIQUE: Bilateral screening digital craniocaudal and mediolateral oblique mammograms were obtained. Bilateral screening digital breast tomosynthesis was performed. The images were evaluated with computer-aided detection. COMPARISON:  Previous exam(s). ACR Breast Density Category b: There are scattered areas of fibroglandular density. FINDINGS: There are no findings suspicious for malignancy. IMPRESSION: No mammographic evidence of malignancy. A result letter of this screening mammogram will be mailed directly to the patient. RECOMMENDATION: Screening mammogram in one year. (Code:SM-B-01Y) BI-RADS CATEGORY  1: Negative. Electronically Signed   By: Lovey Newcomer M.D.   On: 03/06/2021 14:57      Assessment & Plan:   Problem List Items Addressed This Visit     Anemia    Has been evaluated by hematology.  Follow cbc.       Relevant Orders   CBC with Differential/Platelet (Completed)   Ferritin (Completed)   CKD (chronic kidney disease) stage 3, GFR 30-59 ml/min (HCC)    Previous ultrasound unremarkable.  Continue to avoid antiinflammatories.  Follow metabolic panel.       COPD with hypoxia (HCC)    Breathing stable.  Continue trelegy.  Continue nocturnal oxygen.  Follow.       Relevant Medications   methylPREDNISolone (MEDROL DOSEPAK) 4 MG TBPK tablet   Essential hypertension    Continue aldactone and metoprolol.  Blood pressure doing well.  Follow pressures.  Follow metabolic panel.        GERD (gastroesophageal reflux disease)    Continue prevacid.  Controlled.  Health care maintenance    Physical today 05/04/21.  Mammogram 03/06/21 - Birads I.  Colonoscopy 04/2014.        Hypercholesterolemia    Continue crestor.  Low cholesterol diet and exercise.  Follow lipid panel and liver function tests.        Relevant Orders   Lipid panel (Completed)   Basic metabolic panel (Completed)   Hepatic function panel (Completed)   Hyperglycemia    Low carb diet and exercise.  Follow  met b and a1c.       Relevant Orders   Hemoglobin A1c (Completed)   Low back pain - Primary    Increased pain - lower back, right hip and down right leg as outlined.  Increased pain - limiting walking, etc.  Taking tylenol.  Given increased pain, will treat with medrol dose pack.  She has taken steroids previously and tolerated.  Discussed possible side effects and risk of prednisone therapy.  Check xray.  Further w/up pending response.       Relevant Medications   methylPREDNISolone (MEDROL DOSEPAK) 4 MG TBPK tablet   Other Relevant Orders   DG Lumbar Spine 2-3 Views (Completed)   Osteoporosis    Planning prolia injection.       Pulmonary HTN ([place])    Continue trelegy.  Continue oxygen.  Followed by pulmonary and cardiology.          [name], MD

## 2021-05-05 ENCOUNTER — Encounter: Payer: Self-pay | Admitting: Internal Medicine

## 2021-05-05 ENCOUNTER — Other Ambulatory Visit: Payer: Self-pay

## 2021-05-05 DIAGNOSIS — N183 Chronic kidney disease, stage 3 unspecified: Secondary | ICD-10-CM

## 2021-05-05 NOTE — Assessment & Plan Note (Signed)
Continue prevacid.  Controlled.  

## 2021-05-05 NOTE — Assessment & Plan Note (Signed)
Has been evaluated by hematology.  Follow cbc.   

## 2021-05-05 NOTE — Assessment & Plan Note (Signed)
Continue aldactone and metoprolol.  Blood pressure doing well.  Follow pressures.  Follow metabolic panel.

## 2021-05-05 NOTE — Assessment & Plan Note (Signed)
Previous ultrasound unremarkable.  Continue to avoid antiinflammatories.  Follow metabolic panel.

## 2021-05-05 NOTE — Assessment & Plan Note (Signed)
Continue trelegy.  Continue oxygen.  Followed by pulmonary and cardiology.   

## 2021-05-05 NOTE — Assessment & Plan Note (Signed)
Continue crestor.  Low cholesterol diet and exercise. Follow lipid panel and liver function tests.   

## 2021-05-05 NOTE — Assessment & Plan Note (Signed)
Planning prolia injection.

## 2021-05-05 NOTE — Assessment & Plan Note (Signed)
Low carb diet and exercise.  Follow met b and a1c.

## 2021-05-05 NOTE — Assessment & Plan Note (Signed)
Breathing stable.  Continue trelegy.  Continue nocturnal oxygen.  Follow.

## 2021-05-05 NOTE — Assessment & Plan Note (Signed)
Increased pain - lower back, right hip and down right leg as outlined.  Increased pain - limiting walking, etc.  Taking tylenol.  Given increased pain, will treat with medrol dose pack.  She has taken steroids previously and tolerated.  Discussed possible side effects and risk of prednisone therapy.  Check xray.  Further w/up pending response.

## 2021-05-06 ENCOUNTER — Other Ambulatory Visit: Payer: Self-pay | Admitting: Internal Medicine

## 2021-05-06 DIAGNOSIS — M5441 Lumbago with sciatica, right side: Secondary | ICD-10-CM

## 2021-05-06 NOTE — Progress Notes (Signed)
Order placed for ortho referral.   

## 2021-05-10 ENCOUNTER — Other Ambulatory Visit: Payer: Self-pay | Admitting: Pulmonary Disease

## 2021-05-14 ENCOUNTER — Other Ambulatory Visit: Payer: Self-pay

## 2021-05-14 ENCOUNTER — Other Ambulatory Visit (INDEPENDENT_AMBULATORY_CARE_PROVIDER_SITE_OTHER): Payer: Medicare Other

## 2021-05-14 DIAGNOSIS — N183 Chronic kidney disease, stage 3 unspecified: Secondary | ICD-10-CM | POA: Diagnosis not present

## 2021-05-14 LAB — BASIC METABOLIC PANEL
BUN: 20 mg/dL (ref 6–23)
CO2: 32 mEq/L (ref 19–32)
Calcium: 9.5 mg/dL (ref 8.4–10.5)
Chloride: 99 mEq/L (ref 96–112)
Creatinine, Ser: 1.62 mg/dL — ABNORMAL HIGH (ref 0.40–1.20)
GFR: 28.49 mL/min — ABNORMAL LOW (ref >60.00)
Glucose, Bld: 110 mg/dL — ABNORMAL HIGH (ref 70–99)
Potassium: 4.3 mEq/L (ref 3.5–5.1)
Sodium: 138 mEq/L (ref 135–145)

## 2021-05-14 LAB — URINALYSIS, ROUTINE W REFLEX MICROSCOPIC
Bilirubin Urine: NEGATIVE
Ketones, ur: NEGATIVE
Nitrite: NEGATIVE
Specific Gravity, Urine: 1.015 (ref 1.000–1.030)
Urine Glucose: NEGATIVE
Urobilinogen, UA: 0.2 (ref 0.0–1.0)
pH: 6 (ref 5.0–8.0)

## 2021-05-18 ENCOUNTER — Telehealth: Payer: Self-pay

## 2021-05-18 ENCOUNTER — Encounter: Payer: Self-pay | Admitting: Internal Medicine

## 2021-05-18 NOTE — Telephone Encounter (Signed)
-----   Message from Anita Pheasant, MD sent at 05/15/2021  6:16 AM EST ----- Confirm no urinary symptoms.  Notify [name] that her kidney function is stable from the last check, but decreased from checks prior.  Need to stay hydrated.  Given decrease, I would like to refer her to nephrology for further evaluation [name]).

## 2021-05-18 NOTE — Telephone Encounter (Signed)
LM regarding results

## 2021-05-18 NOTE — Addendum Note (Signed)
Addended by: Lars Masson on: 05/18/2021 02:25 PM   Modules accepted: Orders

## 2021-05-18 NOTE — Telephone Encounter (Signed)
Patient aware of results.

## 2021-05-18 NOTE — Telephone Encounter (Signed)
Patient called in stated she returning a call to [place], I was unable to reach [place] but sent a message to have Larena Glassman to call patient @ [phone]

## 2021-05-19 DIAGNOSIS — M810 Age-related osteoporosis without current pathological fracture: Secondary | ICD-10-CM | POA: Diagnosis not present

## 2021-05-20 DIAGNOSIS — M5416 Radiculopathy, lumbar region: Secondary | ICD-10-CM | POA: Diagnosis not present

## 2021-05-20 DIAGNOSIS — M545 Low back pain, unspecified: Secondary | ICD-10-CM | POA: Diagnosis not present

## 2021-06-18 DIAGNOSIS — N184 Chronic kidney disease, stage 4 (severe): Secondary | ICD-10-CM | POA: Diagnosis not present

## 2021-06-18 DIAGNOSIS — R809 Proteinuria, unspecified: Secondary | ICD-10-CM | POA: Diagnosis not present

## 2021-06-18 DIAGNOSIS — R8281 Pyuria: Secondary | ICD-10-CM | POA: Diagnosis not present

## 2021-06-18 DIAGNOSIS — R82998 Other abnormal findings in urine: Secondary | ICD-10-CM | POA: Diagnosis not present

## 2021-06-18 DIAGNOSIS — N200 Calculus of kidney: Secondary | ICD-10-CM | POA: Diagnosis not present

## 2021-06-24 ENCOUNTER — Other Ambulatory Visit: Payer: Self-pay | Admitting: Internal Medicine

## 2021-07-30 ENCOUNTER — Ambulatory Visit: Payer: Medicare Other | Admitting: Pulmonary Disease

## 2021-08-05 ENCOUNTER — Encounter: Payer: Self-pay | Admitting: Pulmonary Disease

## 2021-08-05 ENCOUNTER — Ambulatory Visit (INDEPENDENT_AMBULATORY_CARE_PROVIDER_SITE_OTHER): Payer: Medicare Other | Admitting: Pulmonary Disease

## 2021-08-05 VITALS — BP 124/74 | HR 96 | Temp 98.2°F | Ht 64.0 in | Wt 142.2 lb

## 2021-08-05 DIAGNOSIS — G4736 Sleep related hypoventilation in conditions classified elsewhere: Secondary | ICD-10-CM

## 2021-08-05 DIAGNOSIS — J449 Chronic obstructive pulmonary disease, unspecified: Secondary | ICD-10-CM

## 2021-08-05 DIAGNOSIS — J4489 Other specified chronic obstructive pulmonary disease: Secondary | ICD-10-CM

## 2021-08-05 NOTE — Progress Notes (Signed)
Subjective:    Patient ID: Anita Ferrell, female    DOB: 10-23-1932, 88 y.o.   MRN: [id number] Patient Care Team: Dale Keiser, MD as PCP - General (Internal Medicine)  Chief Complaint  Patient presents with   Follow-up    SOB with exertion and prod cough with clear sputum   PULMONARY PROBLEMS:  Remote smoker Moderate COPD/emphysema Lung nodule -decreased in size, no further follow-up H/O positive PPD - S/P 9 months INH.   HPI 88 year old remote former smoker follows up for COPD.  Last seen 12 January 2021 by Buelah Manis, NP, she was noted to be doing well at that time.  She is using Trelegy Ellipta 100/25, 1 inhalation daily.  Feels she is doing well with this medication.  Rare use of albuterol.  On nocturnal oxygen at 2 L/min feels refreshed when she wakes up in the morning.  Compliant.  No overt complaint today no chest pain, fevers, chills or sweats.  No orthopnea or paroxysmal nocturnal dyspnea.  No lower extremity edema.   Overall she feels well and looks well.  DATA: CT chest 07/29/15:Significant decrease in size of the previously demonstrated right lower lobe nodule, compatible with a benign process. No significant change in multiple additional sub cm nodules and calcified granulomata in both lungs, compatible with a benign process. Mild changes of COPD and chronic bronchitis. Resolved infection in the lingula and left lower lobe PFTs 07/29/15: moderate obstruction, normal lung volumes, mild reduction DLCO.  TEE 10/19/16: LVEF 45%. Moderate mitral regurgitation, moderate TR [facility] 10/25/16: Nonobstructive coronary disease. Moderate pulmonary hypertension (systolic 50 mmHg, mean 30 mmHg) 2D echo 07/17/2019: LVEF 55 to 60%, indeterminate diastolic parameters.  Normal pulmonary artery systolic pressure.  Tricuspid regurgitation mild to moderate.  No aortic stenosis.  Review of Systems A 10 point review of systems was performed and it is as noted above otherwise  negative.  Patient Active Problem List   Diagnosis Date Noted   Osteoporosis 08/19/2020   Low back pain 05/20/2020   Left hip pain 05/20/2020   Irregular heart rhythm 01/15/2020   Nocturnal hypoxia 12/18/2019   Pulmonary HTN 12/26/2016   Moderate mitral regurgitation 10/27/2016   Bradycardia 09/23/2016   Ventricular bigeminy 09/23/2016   Diarrhea 01/13/2016   CKD (chronic kidney disease) stage 3, GFR 30-59 ml/min 10/16/2015   Shortness of breath 03/11/2015   COPD with hypoxia 03/11/2015   Environmental allergies 01/07/2015   Health care maintenance 05/29/2014   Nephrolithiasis 03/13/2014   Obesity 03/10/2014   Splenomegaly 03/05/2014   Rectal bleeding 03/05/2014   Hyperglycemia 12/30/2012   Hemorrhoids 12/30/2012   Grade I internal hemorrhoids 12/30/2012   Anemia 08/27/2012   Hypercholesterolemia 08/25/2012   Essential hypertension 05/28/2012   Asthma, chronic 05/28/2012   GERD (gastroesophageal reflux disease) 05/28/2012   Barrett's esophagus 05/28/2012   Social History   Tobacco Use   Smoking status: Former    Packs/day: 1.00    Years: 40.00    Additional pack years: 0.00    Total pack years: 40.00    Types: Cigarettes    Quit date: 08/17/1990    Years since quitting: 31.9   Smokeless tobacco: Never   Tobacco comments:    Quit in 1992  Substance Use Topics   Alcohol use: No    Alcohol/week: 0.0 standard drinks of alcohol   Allergies  Allergen Reactions   Penicillin G Swelling   Penicillins Swelling and Other (See Comments)    Has patient had a PCN reaction causing  immediate rash, facial/tongue/throat swelling, SOB or lightheadedness with hypotension: Yes Has patient had a PCN reaction causing severe rash involving mucus membranes or skin necrosis: No Has patient had a PCN reaction that required hospitalization No Has patient had a PCN reaction occurring within the last 10 years: No If all of the above answers are "NO", then may proceed with Cephalosporin  use.   Current Meds  Medication Sig   acetaminophen (TYLENOL) 500 MG tablet Take 500 mg by mouth as needed.    albuterol (PROVENTIL) (2.5 MG/3ML) 0.083% nebulizer solution INHALE CONTENTS OF 1 VIAL VIA NEBULIZER EVERY 6 HOURS AS NEEDED FOR WHEEZE OR SHORTNESS OF BREATH   albuterol (VENTOLIN HFA) 108 (90 Base) MCG/ACT inhaler Inhale 1-2 puffs into the lungs every 6 (six) hours as needed for wheezing or shortness of breath.   aspirin EC 81 MG tablet Take 81 mg by mouth daily.   azelastine (ASTELIN) 0.1 % nasal spray Use in each nostril as directed   Calcium Carbonate-Vitamin D 600-400 MG-UNIT tablet Take 1 tablet by mouth at bedtime.   Cranberry 500 MG TABS Take 500 mg by mouth daily at 2 PM.   famotidine (PEPCID) 20 MG tablet Take 20 mg by mouth daily as needed for heartburn or indigestion.   ferrous sulfate 325 (65 FE) MG tablet Take 325 mg by mouth 2 (two) times daily with a meal.    fexofenadine (ALLEGRA) 180 MG tablet Take 180 mg by mouth daily.   furosemide (LASIX) 40 MG tablet Take 0.5 tablets (20 mg total) by mouth every other day.   lansoprazole (PREVACID) 30 MG capsule Take 30 mg by mouth daily.   meclizine (ANTIVERT) 25 MG tablet Take 25 mg by mouth as needed for dizziness.   methylPREDNISolone (MEDROL DOSEPAK) 4 MG TBPK tablet Medrol dosepack 6 day taper.  Take as directed.   metoprolol tartrate (LOPRESSOR) 50 MG tablet TAKE 1/2 TABLET BY MOUTH TWICE A DAY   Multiple Vitamins-Minerals (CENTRUM SILVER PO) Take by mouth.   OXYGEN Inhale 2 L into the lungs at bedtime.   Probiotic Product (ALIGN PO) Take by mouth.   rosuvastatin (CRESTOR) 10 MG tablet TAKE 1 TABLET (10 MG TOTAL) BY MOUTH DAILY. NEEDS APPT FOR FURTHER REFILL. SEE MYCHART MESSAGE   spironolactone (ALDACTONE) 25 MG tablet TAKE 1/2 TABLET BY MOUTH EVERY DAY   TRELEGY ELLIPTA 100-62.5-25 MCG/ACT AEPB INHALE 1 PUFF BY MOUTH EVERY DAY   Immunization History  Administered Date(s) Administered   Fluad Quad(high Dose 65+)  12/15/2018, 12/30/2020   Influenza Split 01/15/2020   Influenza, High Dose Seasonal PF 01/13/2016, 01/25/2017, 01/10/2018   Influenza,inj,Quad PF,6+ Mos 12/26/2012, 01/12/2014, 01/14/2015   Moderna Covid-19 Vaccine Bivalent Booster 74yrs & up 01/26/2021, 02/08/2022   Moderna Sars-Covid-2 Vaccination 05/02/2019, 05/30/2019, 02/14/2020, 08/11/2020   Pneumococcal Conjugate-13 09/02/2014   Pneumococcal Polysaccharide-23 11/14/2012   Tdap 11/14/2012      Objective:   Physical Exam BP 124/74 (BP Location: Left Arm, Cuff Size: Normal)   Pulse 96   Temp 98.2 F (36.8 C) (Temporal)   Ht  (1.626 m)   Wt 142 lb 3.2 oz (64.5 kg)   SpO2 100%   BMI 24.41 kg/m   GENERAL: Well-nourished female, spry, no acute respiratory distress, fully ambulatory. HEAD: Normocephalic, atraumatic.  EYES: Pupils equal, round, reactive to light.  No scleral icterus.  MOUTH: Nose/mouth/throat not examined due to masking requirements for COVID 19. NECK: Supple. No thyromegaly. No nodules. No JVD.  Trachea midline PULMONARY: Distant breath sounds,  coarse, no other adventitious sounds CARDIOVASCULAR: S1 and S2. Regular rate and rhythm.  Grade 2/6 systolic ejection murmur consistent with mitral regurg. GASTROINTESTINAL: Nondistended abdomen.   MUSCULOSKELETAL: No joint deformity, no clubbing, no edema.  NEUROLOGIC: No overt focal deficits speech fluent. SKIN: Intact,warm,dry.  Limited exam no rashes. PSYCH: Mood and behavior normal.     Assessment & Plan:     ICD-10-CM   1. COPD, moderate (HCC)  J44.9    Well compensated on Trelegy Continue Trelegy and as needed albuterol    2. Nocturnal hypoxemia due to obstructive chronic bronchitis (HCC)  J44.9    G47.36    Patient compliant with oxygen therapy Notes benefit from the therapy     Anita Ferrell appears to be doing well.  Continue oxygen at nighttime.  Continue Trelegy Ellipta.  We will see her in follow-up in 6 months time she is to contaKoreact us prior to  that time should any new difficulties arise.  Gailen Shelter, MD Advanced Bronchoscopy PCCM [facility]    *This note was dictated using voice recognition software/Dragon.  Despite best efforts to proofread, errors can occur which can change the meaning. Any transcriptional errors that result from this process are unintentional and may not be fully corrected at the time of dictation.

## 2021-08-05 NOTE — Patient Instructions (Signed)
Continue using oxygen at nighttime. ? ?Continue Trelegy Ellipta. ? ?We will see you in follow-up in 6 months time call sooner should any new problems arise. ?

## 2021-08-25 ENCOUNTER — Encounter: Payer: Self-pay | Admitting: Pulmonary Disease

## 2021-08-25 ENCOUNTER — Other Ambulatory Visit: Payer: Self-pay

## 2021-08-25 MED ORDER — ALBUTEROL SULFATE HFA 108 (90 BASE) MCG/ACT IN AERS: 1.0000 | INHALATION_SPRAY | Freq: Four times a day (QID) | RESPIRATORY_TRACT | 5 refills | Status: DC | PRN

## 2021-09-02 ENCOUNTER — Encounter: Payer: Self-pay | Admitting: Internal Medicine

## 2021-09-02 ENCOUNTER — Ambulatory Visit (INDEPENDENT_AMBULATORY_CARE_PROVIDER_SITE_OTHER): Payer: Medicare Other | Admitting: Internal Medicine

## 2021-09-02 VITALS — BP 122/72 | HR 93 | Temp 98.0°F | Resp 23 | Ht 63.0 in | Wt 140.0 lb

## 2021-09-02 DIAGNOSIS — I10 Essential (primary) hypertension: Secondary | ICD-10-CM

## 2021-09-02 DIAGNOSIS — I272 Pulmonary hypertension, unspecified: Secondary | ICD-10-CM

## 2021-09-02 DIAGNOSIS — R739 Hyperglycemia, unspecified: Secondary | ICD-10-CM

## 2021-09-02 DIAGNOSIS — M5441 Lumbago with sciatica, right side: Secondary | ICD-10-CM | POA: Diagnosis not present

## 2021-09-02 DIAGNOSIS — Z1231 Encounter for screening mammogram for malignant neoplasm of breast: Secondary | ICD-10-CM | POA: Diagnosis not present

## 2021-09-02 DIAGNOSIS — K2100 Gastro-esophageal reflux disease with esophagitis, without bleeding: Secondary | ICD-10-CM | POA: Diagnosis not present

## 2021-09-02 DIAGNOSIS — J449 Chronic obstructive pulmonary disease, unspecified: Secondary | ICD-10-CM | POA: Diagnosis not present

## 2021-09-02 DIAGNOSIS — N183 Chronic kidney disease, stage 3 unspecified: Secondary | ICD-10-CM | POA: Diagnosis not present

## 2021-09-02 DIAGNOSIS — R0902 Hypoxemia: Secondary | ICD-10-CM

## 2021-09-02 DIAGNOSIS — D649 Anemia, unspecified: Secondary | ICD-10-CM | POA: Diagnosis not present

## 2021-09-02 DIAGNOSIS — N200 Calculus of kidney: Secondary | ICD-10-CM

## 2021-09-02 DIAGNOSIS — M810 Age-related osteoporosis without current pathological fracture: Secondary | ICD-10-CM | POA: Diagnosis not present

## 2021-09-02 DIAGNOSIS — E7800 Pure hypercholesterolemia, unspecified: Secondary | ICD-10-CM

## 2021-09-02 LAB — HEPATIC FUNCTION PANEL
ALT: 21 U/L (ref 0–35)
AST: 28 U/L (ref 0–37)
Albumin: 4.2 g/dL (ref 3.5–5.2)
Alkaline Phosphatase: 64 U/L (ref 39–117)
Bilirubin, Direct: 0.1 mg/dL (ref 0.0–0.3)
Total Bilirubin: 0.4 mg/dL (ref 0.2–1.2)
Total Protein: 6.4 g/dL (ref 6.0–8.3)

## 2021-09-02 LAB — LIPID PANEL
Cholesterol: 109 mg/dL (ref 0–200)
HDL: 35.6 mg/dL — ABNORMAL LOW (ref >39.00)
LDL Cholesterol: 46 mg/dL (ref 0–99)
NonHDL: 73.1
Total CHOL/HDL Ratio: 3
Triglycerides: 138 mg/dL (ref 0.0–149.0)
VLDL: 27.6 mg/dL (ref 0.0–40.0)

## 2021-09-02 LAB — HEMOGLOBIN A1C: Hgb A1c MFr Bld: 6 % (ref 4.6–6.5)

## 2021-09-02 LAB — BASIC METABOLIC PANEL
BUN: 22 mg/dL (ref 6–23)
CO2: 28 mEq/L (ref 19–32)
Calcium: 9.6 mg/dL (ref 8.4–10.5)
Chloride: 104 mEq/L (ref 96–112)
Creatinine, Ser: 1.54 mg/dL — ABNORMAL HIGH (ref 0.40–1.20)
GFR: 30.21 mL/min — ABNORMAL LOW (ref >60.00)
Glucose, Bld: 124 mg/dL — ABNORMAL HIGH (ref 70–99)
Potassium: 4.4 mEq/L (ref 3.5–5.1)
Sodium: 142 mEq/L (ref 135–145)

## 2021-09-02 LAB — CBC WITH DIFFERENTIAL/PLATELET
Basophils Absolute: 0 10*3/uL (ref 0.0–0.1)
Basophils Relative: 0.5 % (ref 0.0–3.0)
Eosinophils Absolute: 0.1 10*3/uL (ref 0.0–0.7)
Eosinophils Relative: 2.2 % (ref 0.0–5.0)
HCT: 29.8 % — ABNORMAL LOW (ref 36.0–46.0)
Hemoglobin: 9.4 g/dL — ABNORMAL LOW (ref 12.0–15.0)
Lymphocytes Relative: 20.1 % (ref 12.0–46.0)
Lymphs Abs: 0.8 10*3/uL (ref 0.7–4.0)
MCHC: 31.8 g/dL (ref 30.0–36.0)
MCV: 82.9 fl (ref 78.0–100.0)
Monocytes Absolute: 0.2 10*3/uL (ref 0.1–1.0)
Monocytes Relative: 5.6 % (ref 3.0–12.0)
Neutro Abs: 2.7 10*3/uL (ref 1.4–7.7)
Neutrophils Relative %: 71.6 % (ref 43.0–77.0)
Platelets: 192 10*3/uL (ref 150.0–400.0)
RBC: 3.59 Mil/uL — ABNORMAL LOW (ref 3.87–5.11)
RDW: 14.9 % (ref 11.5–15.5)
WBC: 3.8 10*3/uL — ABNORMAL LOW (ref 4.0–10.5)

## 2021-09-02 LAB — VITAMIN D 25 HYDROXY (VIT D DEFICIENCY, FRACTURES): VITD: 60.04 ng/mL (ref 30.00–100.00)

## 2021-09-02 NOTE — Progress Notes (Signed)
Patient ID: Anita Ferrell, female   DOB: May 02, 1932, 88 y.o.   MRN: [id number]   Subjective:    Patient ID: Anita Ferrell, female    DOB: Sep 03, 1932, 88 y.o.   MRN: [id number]  This visit occurred during the SARS-CoV-2 public health emergency.  Safety protocols were in place, including screening questions prior to the visit, additional usage of staff PPE, and extensive cleaning of exam room while observing appropriate contact time as indicated for disinfecting solutions.   Patient here for a scheduled follow up.   Chief Complaint  Patient presents with   Follow-up    21mo follow up   Hypertension   Anemia   Irregular Heart Beat   .   HPI Reports she feels breathing is overall stable.  Sees Dr Jayme Cloud.  Evaluated 08/05/21.  Recommended continuing trelegy.  Uses oxygen at night.  No increased cough or congestion.  Is followed by Dr Thedore Mins for CKD.  Evaluated 06/18/21 - felt stable.  Calculus of the left kidney noted on renal ultrasound in July 2022. Patient is asymptomatic. Per note, wanted to hold on another ultrasound or x-ray.  Recommended f/u in 6 months.  No chest pain.  Eating.  No nausea or vomiting.  No abdominal pain or bowel issues reported. Was having increased back pain last visit.  Given medrol dose pack.  Xray: Multilevel degenerative disc disease, worst at L1-2, L4-5 and L5-S1. Interval progression at L4-5. Lower lumbar facet hypertrophy. Referred to ortho.  States back is better.  Taking tylenol and helping.     Past Medical History:  Diagnosis Date   A-fib (HCC)    Allergy    Asthma    CHF (congestive heart failure) (HCC)    COPD (chronic obstructive pulmonary disease) (HCC)    GERD (gastroesophageal reflux disease)    Hypercholesteremia    Hypertension    Mitral valve insufficiency    Pulmonary hypertension (HCC)    Right Ventricular systolic pressure at 60 mm Hg by echo on july of 2011; Right heart cardic catherization revealed pulmonary artery pressusre of  27/10 with a mean of; Ventricular pressure 29/10 with pulmonary capillary wedge at 9   TB (pulmonary tuberculosis)    treated with INH therapy    Past Surgical History:  Procedure Laterality Date   CARDIAC CATHETERIZATION  10/2016   [facility]   CATARACT EXTRACTION Bilateral 2008   CHOLECYSTECTOMY     TONSILLECTOMY  1964   Family History  Problem Relation Age of Onset   Heart disease Mother    Heart disease Father    Hypertension Sister    Heart disease Brother    Breast cancer Maternal Aunt    Social History   Socioeconomic History   Marital status: Widowed    Spouse name: Not on file   Number of children: 3   Years of education: Not on file   Highest education level: Not on file  Occupational History   Not on file  Tobacco Use   Smoking status: Former    Packs/day: 1.00    Years: 40.00    Pack years: 40.00    Types: Cigarettes    Quit date: 08/17/1990    Years since quitting: 31.0   Smokeless tobacco: Never   Tobacco comments:    Quit in 1992  Vaping Use   Vaping Use: Never used  Substance and Sexual Activity   Alcohol use: No    Alcohol/week: 0.0 standard drinks   Drug use: No  Sexual activity: Not Currently  Other Topics Concern   Not on file  Social History Narrative   Not on file   Social Determinants of Health   Financial Resource Strain: Not on file  Food Insecurity: Not on file  Transportation Needs: Not on file  Physical Activity: Not on file  Stress: Not on file  Social Connections: Not on file     Review of Systems  Constitutional:  Negative for appetite change and unexpected weight change.  HENT:  Negative for congestion and sinus pressure.   Respiratory:  Negative for cough and chest tightness.        Feels breathing is overall stable.   Cardiovascular:  Negative for chest pain, palpitations and leg swelling.  Gastrointestinal:  Negative for abdominal pain, diarrhea, nausea and vomiting.  Genitourinary:  Negative for difficulty urinating  and dysuria.  Musculoskeletal:  Negative for joint swelling and myalgias.       Back pain is better.   Skin:  Negative for color change and rash.  Neurological:  Negative for dizziness, light-headedness and headaches.  Psychiatric/Behavioral:  Negative for agitation and dysphoric mood.       Objective:     BP 122/72   Pulse 93   Temp 98 F (36.7 C) (Temporal)   Resp (!) 23   Ht $R'5\' 3"'nk$  (1.6 m)   Wt 140 lb (63.5 kg)   SpO2 98%   BMI 24.80 kg/m  Wt Readings from Last 3 Encounters:  09/02/21 140 lb (63.5 kg)  08/05/21 142 lb 3.2 oz (64.5 kg)  05/04/21 141 lb 9.6 oz (64.2 kg)    Physical Exam Vitals reviewed.  Constitutional:      General: She is not in acute distress.    Appearance: Normal appearance.  HENT:     Head: Normocephalic and atraumatic.     Right Ear: External ear normal.     Left Ear: External ear normal.  Eyes:     General: No scleral icterus.       Right eye: No discharge.        Left eye: No discharge.     Conjunctiva/sclera: Conjunctivae normal.  Neck:     Thyroid: No thyromegaly.  Cardiovascular:     Rate and Rhythm: Normal rate and regular rhythm.  Pulmonary:     Effort: No respiratory distress.     Breath sounds: Normal breath sounds. No wheezing.  Abdominal:     General: Bowel sounds are normal.     Palpations: Abdomen is soft.     Tenderness: There is no abdominal tenderness.  Musculoskeletal:        General: No swelling or tenderness.     Cervical back: Neck supple. No tenderness.  Lymphadenopathy:     Cervical: No cervical adenopathy.  Skin:    Findings: No erythema or rash.  Neurological:     Mental Status: She is alert.  Psychiatric:        Mood and Affect: Mood normal.        Behavior: Behavior normal.     Outpatient Encounter Medications as of 09/02/2021  Medication Sig   acetaminophen (TYLENOL) 500 MG tablet Take 500 mg by mouth as needed.    albuterol (PROVENTIL) (2.5 MG/3ML) 0.083% nebulizer solution INHALE CONTENTS OF 1  VIAL VIA NEBULIZER EVERY 6 HOURS AS NEEDED FOR WHEEZE OR SHORTNESS OF BREATH   albuterol (VENTOLIN HFA) 108 (90 Base) MCG/ACT inhaler Inhale 1-2 puffs into the lungs every 6 (six) hours as needed for  wheezing or shortness of breath.   aspirin EC 81 MG tablet Take 81 mg by mouth daily.   azelastine (ASTELIN) 0.1 % nasal spray Use in each nostril as directed   Calcium Carbonate-Vitamin D 600-400 MG-UNIT tablet Take 1 tablet by mouth at bedtime.   Cranberry 500 MG TABS Take 500 mg by mouth daily at 2 PM.   famotidine (PEPCID) 20 MG tablet Take 20 mg by mouth daily as needed for heartburn or indigestion.   ferrous sulfate 325 (65 FE) MG tablet Take 325 mg by mouth 2 (two) times daily with a meal.    fexofenadine (ALLEGRA) 180 MG tablet Take 180 mg by mouth daily.   furosemide (LASIX) 40 MG tablet Take 0.5 tablets (20 mg total) by mouth every other day.   lansoprazole (PREVACID) 30 MG capsule Take 30 mg by mouth daily.   meclizine (ANTIVERT) 25 MG tablet Take 25 mg by mouth as needed for dizziness.   methylPREDNISolone (MEDROL DOSEPAK) 4 MG TBPK tablet Medrol dosepack 6 day taper.  Take as directed.   metoprolol tartrate (LOPRESSOR) 50 MG tablet TAKE 1/2 TABLET BY MOUTH TWICE A DAY   Multiple Vitamins-Minerals (CENTRUM SILVER PO) Take by mouth.   OXYGEN Inhale 2 L into the lungs at bedtime.   Probiotic Product (ALIGN PO) Take by mouth.   rosuvastatin (CRESTOR) 10 MG tablet TAKE 1 TABLET (10 MG TOTAL) BY MOUTH DAILY. NEEDS APPT FOR FURTHER REFILL. SEE MYCHART MESSAGE   spironolactone (ALDACTONE) 25 MG tablet TAKE 1/2 TABLET BY MOUTH EVERY DAY   TRELEGY ELLIPTA 100-62.5-25 MCG/ACT AEPB INHALE 1 PUFF BY MOUTH EVERY DAY   No facility-administered encounter medications on file as of 09/02/2021.     Lab Results  Component Value Date   WBC 3.8 (L) 09/02/2021   HGB 9.4 (L) 09/02/2021   HCT 29.8 (L) 09/02/2021   PLT 192.0 09/02/2021   GLUCOSE 124 (H) 09/02/2021   CHOL 109 09/02/2021   TRIG 138.0  09/02/2021   HDL 35.60 (L) 09/02/2021   LDLDIRECT 127.1 08/25/2012   LDLCALC 46 09/02/2021   ALT 21 09/02/2021   AST 28 09/02/2021   NA 142 09/02/2021   K 4.4 09/02/2021   CL 104 09/02/2021   CREATININE 1.54 (H) 09/02/2021   BUN 22 09/02/2021   CO2 28 09/02/2021   TSH 1.37 12/30/2020   HGBA1C 6.0 09/02/2021    MM 3D SCREEN BREAST BILATERAL  Result Date: 03/06/2021 CLINICAL DATA:  Screening. EXAM: DIGITAL SCREENING BILATERAL MAMMOGRAM WITH TOMOSYNTHESIS AND CAD TECHNIQUE: Bilateral screening digital craniocaudal and mediolateral oblique mammograms were obtained. Bilateral screening digital breast tomosynthesis was performed. The images were evaluated with computer-aided detection. COMPARISON:  Previous exam(s). ACR Breast Density Category b: There are scattered areas of fibroglandular density. FINDINGS: There are no findings suspicious for malignancy. IMPRESSION: No mammographic evidence of malignancy. A result letter of this screening mammogram will be mailed directly to the patient. RECOMMENDATION: Screening mammogram in one year. (Code:SM-B-01Y) BI-RADS CATEGORY  1: Negative. Electronically Signed   By: Lovey Newcomer M.D.   On: 03/06/2021 14:57      Assessment & Plan:   Problem List Items Addressed This Visit     Anemia    Has been evaluated by hematology previously.  Had recommended following as long as hgb stable.  Recheck cbc, iron studies and B12 today.        Relevant Orders   CBC w/Diff (Completed)   Ferritin   CKD (chronic kidney disease) stage 3, GFR  30-59 ml/min [facility])    Seeing nephrology.  Stable.  Avoid antiinflammatories.  Follow met b       COPD with hypoxia [facility])    She feels her breathing is stable.  Continue trelegy.  Continue nocturnal oxygen.  Follow.        Essential hypertension    Continue aldactone and metoprolol.  Blood pressure doing well.  Follow pressures.  Follow metabolic panel.         GERD (gastroesophageal reflux disease)    Continue  prevacid.  Controlled.        Hypercholesterolemia    Continue crestor.  Low cholesterol diet and exercise.  Follow lipid panel and liver function tests.         Relevant Orders   Lipid Profile (Completed)   Hyperglycemia    Low carb diet and exercise.  Follow met b and a1c.        Relevant Orders   HgB A1c (Completed)   Low back pain    Increased back pain last visit.  Xray: Multilevel degenerative disc disease, worst at L1-2, L4-5 and L5-S1. Interval progression at L4-5.  Lower lumbar facet hypertrophy. Had placed order for referral to ortho.  Pain is better.  Taking tylenol.  Will notify me if feels needs any further intervention.        Nephrolithiasis    Calculus of the left kidney noted on renal ultrasound in July 2022. Patient is asymptomatic. Per nephrology note, wanted to hold on another ultrasound or x-ray at this time.  Follow.        Osteoporosis    Check vitamin D level today.  Had discussed prolia.        Relevant Orders   VITAMIN D 25 Hydroxy (Vit-D Deficiency, Fractures) (Completed)   Pulmonary HTN ([place])    Continue trelegy.  Continue oxygen.  Followed by pulmonary and cardiology.         Relevant Orders   Basic Metabolic Panel (BMET) (Completed)   Hepatic function panel (Completed)   Other Visit Diagnoses     Visit for screening mammogram    -  Primary   Relevant Orders   MM 3D SCREEN BREAST BILATERAL        [name], MD

## 2021-09-03 ENCOUNTER — Other Ambulatory Visit (INDEPENDENT_AMBULATORY_CARE_PROVIDER_SITE_OTHER): Payer: Medicare Other

## 2021-09-03 ENCOUNTER — Ambulatory Visit: Payer: Self-pay | Admitting: Urology

## 2021-09-03 ENCOUNTER — Other Ambulatory Visit: Payer: Self-pay | Admitting: Internal Medicine

## 2021-09-03 DIAGNOSIS — D649 Anemia, unspecified: Secondary | ICD-10-CM

## 2021-09-03 LAB — IBC + FERRITIN
Ferritin: 36.4 ng/mL (ref 10.0–291.0)
Iron: 44 ug/dL (ref 42–145)
Saturation Ratios: 13.5 % — ABNORMAL LOW (ref 20.0–50.0)
TIBC: 326.2 ug/dL (ref 250.0–450.0)
Transferrin: 233 mg/dL (ref 212.0–360.0)

## 2021-09-03 LAB — VITAMIN B12: Vitamin B-12: 744 pg/mL (ref 211–911)

## 2021-09-03 NOTE — Progress Notes (Signed)
Orders placed for add on labs.  

## 2021-09-04 ENCOUNTER — Encounter: Payer: Self-pay | Admitting: Internal Medicine

## 2021-09-04 NOTE — Assessment & Plan Note (Signed)
Calculus of the left kidney noted on renal ultrasound in July 2022. Patient is asymptomatic. Per nephrology note, wanted to hold on another ultrasound or x-ray at this time.  Follow.

## 2021-09-04 NOTE — Assessment & Plan Note (Signed)
Check vitamin D level today.  Had discussed prolia.

## 2021-09-04 NOTE — Assessment & Plan Note (Signed)
She feels her breathing is stable.  Continue trelegy.  Continue nocturnal oxygen.  Follow.  

## 2021-09-04 NOTE — Assessment & Plan Note (Signed)
Continue trelegy.  Continue oxygen.  Followed by pulmonary and cardiology.   

## 2021-09-04 NOTE — Assessment & Plan Note (Signed)
Continue aldactone and metoprolol.  Blood pressure doing well.  Follow pressures.  Follow metabolic panel.

## 2021-09-04 NOTE — Assessment & Plan Note (Signed)
Seeing nephrology.  Stable.  Avoid antiinflammatories.  Follow met b

## 2021-09-04 NOTE — Assessment & Plan Note (Signed)
Low carb diet and exercise.  Follow met b and a1c.  

## 2021-09-04 NOTE — Assessment & Plan Note (Signed)
Has been evaluated by hematology previously.  Had recommended following as long as hgb stable.  Recheck cbc, iron studies and B12 today.

## 2021-09-04 NOTE — Assessment & Plan Note (Signed)
Increased back pain last visit.  Xray: Multilevel degenerative disc disease, worst at L1-2, L4-5 and L5-S1. Interval progression at L4-5.  Lower lumbar facet hypertrophy. Had placed order for referral to ortho.  Pain is better.  Taking tylenol.  Will notify me if feels needs any further intervention.

## 2021-09-04 NOTE — Assessment & Plan Note (Signed)
Continue crestor.  Low cholesterol diet and exercise. Follow lipid panel and liver function tests.   

## 2021-09-04 NOTE — Assessment & Plan Note (Signed)
Continue prevacid.  Controlled.  

## 2021-09-05 LAB — FOLATE RBC: RBC Folate: 1064 ng/mL RBC (ref >280)

## 2021-09-06 ENCOUNTER — Other Ambulatory Visit: Payer: Self-pay | Admitting: Pulmonary Disease

## 2021-09-11 ENCOUNTER — Other Ambulatory Visit: Payer: Self-pay | Admitting: Internal Medicine

## 2021-09-11 DIAGNOSIS — D649 Anemia, unspecified: Secondary | ICD-10-CM

## 2021-09-11 NOTE — Progress Notes (Signed)
Order placed for hematology referral.  

## 2021-09-14 ENCOUNTER — Emergency Department
Admission: EM | Admit: 2021-09-14 | Discharge: 2021-09-14 | Disposition: A | Discharge status: Home / Self Care | Payer: Medicare Other | Attending: Emergency Medicine | Admitting: Emergency Medicine

## 2021-09-14 ENCOUNTER — Other Ambulatory Visit: Payer: Self-pay

## 2021-09-14 ENCOUNTER — Emergency Department: Payer: Medicare Other

## 2021-09-14 DIAGNOSIS — M549 Dorsalgia, unspecified: Secondary | ICD-10-CM | POA: Diagnosis not present

## 2021-09-14 DIAGNOSIS — I10 Essential (primary) hypertension: Secondary | ICD-10-CM | POA: Diagnosis not present

## 2021-09-14 DIAGNOSIS — N189 Chronic kidney disease, unspecified: Secondary | ICD-10-CM | POA: Diagnosis not present

## 2021-09-14 DIAGNOSIS — I509 Heart failure, unspecified: Secondary | ICD-10-CM | POA: Diagnosis not present

## 2021-09-14 DIAGNOSIS — R52 Pain, unspecified: Secondary | ICD-10-CM | POA: Diagnosis not present

## 2021-09-14 DIAGNOSIS — W19XXXA Unspecified fall, initial encounter: Secondary | ICD-10-CM | POA: Diagnosis not present

## 2021-09-14 DIAGNOSIS — M25531 Pain in right wrist: Secondary | ICD-10-CM | POA: Diagnosis not present

## 2021-09-14 DIAGNOSIS — W06XXXA Fall from bed, initial encounter: Secondary | ICD-10-CM | POA: Insufficient documentation

## 2021-09-14 DIAGNOSIS — J449 Chronic obstructive pulmonary disease, unspecified: Secondary | ICD-10-CM | POA: Diagnosis not present

## 2021-09-14 DIAGNOSIS — I959 Hypotension, unspecified: Secondary | ICD-10-CM | POA: Diagnosis not present

## 2021-09-14 DIAGNOSIS — S52611A Displaced fracture of right ulna styloid process, initial encounter for closed fracture: Secondary | ICD-10-CM | POA: Insufficient documentation

## 2021-09-14 DIAGNOSIS — I129 Hypertensive chronic kidney disease with stage 1 through stage 4 chronic kidney disease, or unspecified chronic kidney disease: Secondary | ICD-10-CM | POA: Insufficient documentation

## 2021-09-14 DIAGNOSIS — S6991XA Unspecified injury of right wrist, hand and finger(s), initial encounter: Secondary | ICD-10-CM | POA: Diagnosis present

## 2021-09-14 DIAGNOSIS — S52501A Unspecified fracture of the lower end of right radius, initial encounter for closed fracture: Secondary | ICD-10-CM | POA: Insufficient documentation

## 2021-09-14 DIAGNOSIS — S6291XA Unspecified fracture of right wrist and hand, initial encounter for closed fracture: Secondary | ICD-10-CM | POA: Diagnosis not present

## 2021-09-14 DIAGNOSIS — S52531A Colles' fracture of right radius, initial encounter for closed fracture: Secondary | ICD-10-CM | POA: Diagnosis not present

## 2021-09-14 LAB — CBC
HCT: 30 % — ABNORMAL LOW (ref 36.0–46.0)
Hemoglobin: 8.9 g/dL — ABNORMAL LOW (ref 12.0–15.0)
MCH: 25.9 pg — ABNORMAL LOW (ref 26.0–34.0)
MCHC: 29.7 g/dL — ABNORMAL LOW (ref 30.0–36.0)
MCV: 87.2 fL (ref 80.0–100.0)
Platelets: 187 10*3/uL (ref 150–400)
RBC: 3.44 MIL/uL — ABNORMAL LOW (ref 3.87–5.11)
RDW: 13.9 % (ref 11.5–15.5)
WBC: 3.5 10*3/uL — ABNORMAL LOW (ref 4.0–10.5)
nRBC: 0 % (ref 0.0–0.2)

## 2021-09-14 LAB — BASIC METABOLIC PANEL
Anion gap: 8 (ref 5–15)
BUN: 25 mg/dL — ABNORMAL HIGH (ref 8–23)
CO2: 28 mmol/L (ref 22–32)
Calcium: 9.2 mg/dL (ref 8.9–10.3)
Chloride: 104 mmol/L (ref 98–111)
Creatinine, Ser: 1.42 mg/dL — ABNORMAL HIGH (ref 0.44–1.00)
GFR, Estimated: 36 mL/min — ABNORMAL LOW (ref >60)
Glucose, Bld: 135 mg/dL — ABNORMAL HIGH (ref 70–99)
Potassium: 3.9 mmol/L (ref 3.5–5.1)
Sodium: 140 mmol/L (ref 135–145)

## 2021-09-14 MED ORDER — TRAMADOL HCL 50 MG PO TABS: 50.0000 mg | ORAL_TABLET | Freq: Four times a day (QID) | ORAL | 0 refills | Status: DC | PRN

## 2021-09-14 MED ORDER — ONDANSETRON HCL 4 MG/2ML IJ SOLN
4.0000 mg | Freq: Once | INTRAMUSCULAR | Status: AC
  Administered 2021-09-14: 4 mg via INTRAVENOUS
  Filled 2021-09-14: qty 2

## 2021-09-14 MED ORDER — NALOXONE HCL 2 MG/2ML IJ SOSY
PREFILLED_SYRINGE | INTRAMUSCULAR | Status: AC
  Filled 2021-09-14: qty 2

## 2021-09-14 MED ORDER — FENTANYL CITRATE PF 50 MCG/ML IJ SOSY
50.0000 ug | PREFILLED_SYRINGE | Freq: Once | INTRAMUSCULAR | Status: AC
  Administered 2021-09-14: 50 ug via INTRAVENOUS
  Filled 2021-09-14: qty 1

## 2021-09-14 MED ORDER — FENTANYL CITRATE PF 50 MCG/ML IJ SOSY
25.0000 ug | PREFILLED_SYRINGE | Freq: Once | INTRAMUSCULAR | Status: AC
  Administered 2021-09-14: 25 ug via INTRAVENOUS
  Filled 2021-09-14: qty 1

## 2021-09-14 MED ORDER — MIDAZOLAM HCL 2 MG/2ML IJ SOLN
2.0000 mg | Freq: Once | INTRAMUSCULAR | Status: AC
Start: 2021-09-14 — End: 2021-09-14
  Administered 2021-09-14: 1 mg via INTRAVENOUS
  Filled 2021-09-14: qty 2

## 2021-09-14 MED ORDER — LIDOCAINE HCL (PF) 1 % IJ SOLN
INTRAMUSCULAR | Status: AC
  Filled 2021-09-14: qty 5

## 2021-09-14 NOTE — Discharge Instructions (Addendum)
Follow up with the orthopedist within the next week.  Keep the splint on at all times until you follow up.    You should take over the counter tylenol for pain but you can take the prescribed tramadol if needed for pain not relieved with tylenol.  Return to the ER for new or worsening pain, swelling, numbness, inability to move the fingers, recurrent falls or weakness, or any other new or worsening symptoms that concern you.

## 2021-09-14 NOTE — ED Triage Notes (Signed)
Pt to ED [facility] from home for mechanical fall on the way to bathroom. Denies loc, hitting head. Deformity to left wrist. Pt in splint from [facility].  Received 75 mcg fentanyl and '4mg'$  zofran EMS PTA.  20g IV to Left AC.  Pt alert and oriented.

## 2021-09-14 NOTE — ED Notes (Signed)
Pt placed on 2 L [place] after fentanyl administration

## 2021-09-14 NOTE — ED Notes (Signed)
Pt placed on external cath

## 2021-09-14 NOTE — Sedation Documentation (Signed)
Anita Hoard, CNA   Faith,CNA and Maria,CNA at bedside for procedure.

## 2021-09-14 NOTE — ED Provider Notes (Addendum)
[facility] Provider Note    Event Date/Time   First MD Initiated Contact with Patient 09/14/21 [phone]     (approximate)   History   Fall   HPI  Anita Ferrell is a 88 y.o. female with a history of A-fib, CHF, COPD, hypertension, pulmonary hypertension, chronic kidney disease who presents with a right wrist injury.  The patient states that she got up out of bed to go to the bathroom and fell.  She is not sure exactly how she fell but denies passing out.  She states that she quite often feels a bit dizzy when she first wakes up but did not specifically feel dizzy or weak today.  She did not hit her head.  She reports pain to the right wrist as well as some generalized pain to the back that is not any specific area.  She denies any other injuries.    Physical Exam   Triage Vital Signs: ED Triage Vitals  Enc Vitals Group     BP 09/14/21 0824 117/60     Pulse Rate 09/14/21 0823 75     Resp 09/14/21 0823 20     Temp 09/14/21 0823 (!) 97.3 F (36.3 C)     Temp Source 09/14/21 0823 Oral     SpO2 09/14/21 0823 96 %     Weight 09/14/21 0823 138 lb 14.2 oz (63 kg)     Height 09/14/21 0823 '5\' 3"'$  (1.6 m)     Head Circumference --      Peak Flow --      Pain Score 09/14/21 0823 5     Pain Loc --      Pain Edu? --      Excl. in [place]? --     Most recent vital signs: Vitals:   09/14/21 1315 09/14/21 1330  BP: (!) 121/57 (!) 122/59  Pulse: 84 85  Resp: 19 18  Temp:    SpO2: 100% 100%    General: Alert and oriented, somewhat frail appearing but in no acute distress. CV:  Good peripheral perfusion.  Resp:  Normal effort.  Abd:  No distention.  Other:  No midline cervical, thoracic, or lumbar spinal tenderness.  Right wrist deformity.  2+ radial pulse.  Motor and sensory intact in median, ulnar, and radial distributions.   ED Results / Procedures / Treatments   Labs (all labs ordered are listed, but only abnormal results are displayed) Labs Reviewed   CBC - Abnormal; Notable for the following components:      Result Value   WBC 3.5 (*)    RBC 3.44 (*)    Hemoglobin 8.9 (*)    HCT 30.0 (*)    MCH 25.9 (*)    MCHC 29.7 (*)    All other components within normal limits  BASIC METABOLIC PANEL - Abnormal; Notable for the following components:   Glucose, Bld 135 (*)    BUN 25 (*)    Creatinine, Ser 1.42 (*)    GFR, Estimated 36 (*)    All other components within normal limits  URINALYSIS, ROUTINE W REFLEX MICROSCOPIC     EKG  ED ECG REPORT I, Arta Silence, the attending physician, personally viewed and interpreted this ECG.  Date: 09/14/2021 EKG Time: 0822 Rate: 75 Rhythm: normal sinus rhythm QRS Axis: normal Intervals: Nonspecific IVCD ST/T Wave abnormalities: normal Narrative Interpretation: no evidence of acute ischemia; no significant change when compared to EKG of 01/07/2020 (although interpretation limited by poor  EKG baseline)    RADIOLOGY  XR R wrist: I independently viewed and interpreted the images; there is a displaced and impacted distal radius fracture  XR R wrist post reduction: Improved alignment of the fracture  PROCEDURES:  Critical Care performed: No  .Ortho Injury Treatment  Date/Time: 09/14/2021 2:15 PM Performed by: Arta Silence, MD Authorized by: Arta Silence, MD   Consent:    Consent obtained:  Written   Consent given by:  Patient   Risks discussed:  Nerve damage, restricted joint movement and recurrent dislocation   Alternatives discussed:  ImmobilizationInjury location: wrist Location details: left wrist Injury type: fracture Fracture type: distal radius Pre-procedure neurovascular assessment: neurovascularly intact Anesthesia: hematoma block  Anesthesia: Local anesthesia used: yes Local Anesthetic: lidocaine 1% without epinephrine Anesthetic total: 6 mL  Patient sedated: Yes. Refer to sedation procedure documentation for details of sedation. Manipulation  performed: yes Skin traction used: yes X-ray confirmed reduction: yes Immobilization: splint Splint type: sugar tong Splint Applied by: ED Tech Post-procedure neurovascular assessment: post-procedure neurovascularly intact   .Sedation  Date/Time: 09/14/2021 2:16 PM Performed by: Arta Silence, MD Authorized by: Arta Silence, MD   Consent:    Consent obtained:  Written   Consent given by:  Patient   Risks discussed:  Prolonged hypoxia resulting in organ damage, dysrhythmia, prolonged sedation necessitating reversal, inadequate sedation and respiratory compromise necessitating ventilatory assistance and intubation   Alternatives discussed:  Analgesia without sedation Universal protocol:    Immediately prior to procedure, a time out was called: yes   Pre-sedation assessment:    Time since last food or drink:  6 hours   ASA classification: class 3 - patient with severe systemic disease     Mallampati score:  I - soft palate, uvula, fauces, pillars visible   Pre-sedation assessments completed and reviewed: airway patency, cardiovascular function, mental status, pain level and respiratory function   Procedure details (see MAR for exact dosages):    Sedation:  Midazolam   Intended level of sedation: moderate (conscious sedation)   Analgesia:  Fentanyl   Intra-procedure monitoring:  Blood pressure monitoring, continuous capnometry, frequent LOC assessments, cardiac monitor, continuous pulse oximetry and frequent vital sign checks   Intra-procedure events: none     Total Provider sedation time (minutes):  10 Post-procedure details:    Attendance: Constant attendance by certified staff until patient recovered     Recovery: Patient returned to pre-procedure baseline     Patient is stable for discharge or admission: yes     Procedure completion:  Tolerated well, no immediate complications   MEDICATIONS ORDERED IN ED: Medications  fentaNYL (SUBLIMAZE) injection 50 mcg (50  mcg Intravenous Given 09/14/21 1019)  ondansetron (ZOFRAN) injection 4 mg (4 mg Intravenous Given 09/14/21 1018)  lidocaine (PF) (XYLOCAINE) 1 % injection (  Given by Other 09/14/21 1115)  midazolam (VERSED) injection 2 mg (1 mg Intravenous Given 09/14/21 1215)  fentaNYL (SUBLIMAZE) injection 25 mcg (25 mcg Intravenous Given 09/14/21 1215)     IMPRESSION / MDM / [place] / ED COURSE  I reviewed the triage vital signs and the nursing notes.  88 year old female with PMH as noted above presents with a right wrist injury after a fall.  She denies other acute injuries.  On exam her vital signs are normal.  There is deformity to the right wrist but the extremity is neuro/vascular intact.  There is no midline spinal tenderness or evidence of other injuries.  Differential diagnosis includes, but is not limited to,  fracture, contusion, dislocation.  Overall presentation is most consistent with mechanical fall; the patient does not describe a specific presyncope or any loss of consciousness.  There is no evidence of primary cardiac cause.  Patient's presentation is most consistent with acute presentation with potential threat to life or bodily function.  We will obtain x-rays, basic labs, and reassess.  ----------------------------------------- 1:57 PM on 09/14/2021 -----------------------------------------  X-ray confirms displaced distal radius fracture.  I consulted Dr. Posey Pronto from orthopedics and discussed the case with him.  He recommended closed reduction in the ED and outpatient follow-up.  The wrist was reduced successfully under moderate sedation.  Due to the patient's age and comorbidities I initially consider reduction with hematoma block only but the patient was not able to tolerate any manipulation so she was consented for sedation.  She tolerated procedure well with no complications and has now returned to her baseline mental status.  Repeat x-ray shows significant improvement  in the alignment.  Lab work-up is unremarkable.  The patient has a chronically elevated creatinine and has anemia, both unchanged from prior.    At this time, the patient is stable for discharge home.  I counseled her and her son on the results of the work-up, orthopedic follow-up, and return precautions; they expressed understanding.   FINAL CLINICAL IMPRESSION(S) / ED DIAGNOSES   Final diagnoses:  Closed fracture of distal end of right radius, unspecified fracture morphology, initial encounter     Rx / DC Orders   ED Discharge Orders          Ordered    traMADol (ULTRAM) 50 MG tablet  Every 6 hours PRN        09/14/21 1347             Note:  This document was prepared using Dragon voice recognition software and may include unintentional dictation errors.    Arta Silence, MD 09/14/21 1415    Arta Silence, MD 09/14/21 [phone]

## 2021-09-21 DIAGNOSIS — S52501A Unspecified fracture of the lower end of right radius, initial encounter for closed fracture: Secondary | ICD-10-CM | POA: Diagnosis not present

## 2021-09-22 ENCOUNTER — Encounter: Payer: Self-pay | Admitting: Internal Medicine

## 2021-09-22 ENCOUNTER — Other Ambulatory Visit: Payer: Self-pay | Admitting: Orthopedic Surgery

## 2021-09-22 NOTE — Telephone Encounter (Signed)
Thank her for the update.  Also, I would recommend she let Dr Patsey Berthold know of planned surgery to see if has any pulmonary recs prior to surgery.  Keep [place] posted

## 2021-09-23 MED ORDER — VANCOMYCIN HCL IN DEXTROSE 1-5 GM/200ML-% IV SOLN
1000.0000 mg | INTRAVENOUS | Status: AC
  Administered 2021-09-24: 1000 mg via INTRAVENOUS

## 2021-09-24 ENCOUNTER — Encounter
Admission: RE | Disposition: A | Discharge status: Home / Self Care | Payer: Self-pay | Source: Home / Self Care | Attending: Orthopedic Surgery

## 2021-09-24 ENCOUNTER — Ambulatory Visit: Payer: Medicare Other

## 2021-09-24 ENCOUNTER — Encounter: Payer: Self-pay | Admitting: Orthopedic Surgery

## 2021-09-24 ENCOUNTER — Other Ambulatory Visit: Payer: Self-pay

## 2021-09-24 ENCOUNTER — Ambulatory Visit: Payer: Medicare Other | Admitting: Anesthesiology

## 2021-09-24 ENCOUNTER — Ambulatory Visit: Payer: Medicare Other | Admitting: Urology

## 2021-09-24 ENCOUNTER — Ambulatory Visit
Admission: RE | Admit: 2021-09-24 | Discharge: 2021-09-24 | Disposition: A | Discharge status: Home / Self Care | Payer: Medicare Other | Attending: Orthopedic Surgery | Admitting: Orthopedic Surgery

## 2021-09-24 DIAGNOSIS — Y92009 Unspecified place in unspecified non-institutional (private) residence as the place of occurrence of the external cause: Secondary | ICD-10-CM | POA: Diagnosis not present

## 2021-09-24 DIAGNOSIS — N184 Chronic kidney disease, stage 4 (severe): Secondary | ICD-10-CM | POA: Insufficient documentation

## 2021-09-24 DIAGNOSIS — D649 Anemia, unspecified: Secondary | ICD-10-CM | POA: Diagnosis not present

## 2021-09-24 DIAGNOSIS — W19XXXA Unspecified fall, initial encounter: Secondary | ICD-10-CM | POA: Insufficient documentation

## 2021-09-24 DIAGNOSIS — I509 Heart failure, unspecified: Secondary | ICD-10-CM | POA: Insufficient documentation

## 2021-09-24 DIAGNOSIS — Y9301 Activity, walking, marching and hiking: Secondary | ICD-10-CM | POA: Insufficient documentation

## 2021-09-24 DIAGNOSIS — K219 Gastro-esophageal reflux disease without esophagitis: Secondary | ICD-10-CM | POA: Insufficient documentation

## 2021-09-24 DIAGNOSIS — I4891 Unspecified atrial fibrillation: Secondary | ICD-10-CM | POA: Diagnosis not present

## 2021-09-24 DIAGNOSIS — Z79899 Other long term (current) drug therapy: Secondary | ICD-10-CM | POA: Diagnosis not present

## 2021-09-24 DIAGNOSIS — I272 Pulmonary hypertension, unspecified: Secondary | ICD-10-CM | POA: Insufficient documentation

## 2021-09-24 DIAGNOSIS — M810 Age-related osteoporosis without current pathological fracture: Secondary | ICD-10-CM | POA: Insufficient documentation

## 2021-09-24 DIAGNOSIS — D759 Disease of blood and blood-forming organs, unspecified: Secondary | ICD-10-CM | POA: Insufficient documentation

## 2021-09-24 DIAGNOSIS — Z9981 Dependence on supplemental oxygen: Secondary | ICD-10-CM | POA: Insufficient documentation

## 2021-09-24 DIAGNOSIS — Z880 Allergy status to penicillin: Secondary | ICD-10-CM | POA: Diagnosis not present

## 2021-09-24 DIAGNOSIS — J449 Chronic obstructive pulmonary disease, unspecified: Secondary | ICD-10-CM | POA: Insufficient documentation

## 2021-09-24 DIAGNOSIS — I340 Nonrheumatic mitral (valve) insufficiency: Secondary | ICD-10-CM | POA: Insufficient documentation

## 2021-09-24 DIAGNOSIS — S52501A Unspecified fracture of the lower end of right radius, initial encounter for closed fracture: Secondary | ICD-10-CM | POA: Diagnosis not present

## 2021-09-24 DIAGNOSIS — I132 Hypertensive heart and chronic kidney disease with heart failure and with stage 5 chronic kidney disease, or end stage renal disease: Secondary | ICD-10-CM | POA: Insufficient documentation

## 2021-09-24 DIAGNOSIS — Z87891 Personal history of nicotine dependence: Secondary | ICD-10-CM | POA: Diagnosis not present

## 2021-09-24 HISTORY — PX: ORIF WRIST FRACTURE: SHX2133

## 2021-09-24 SURGERY — OPEN REDUCTION INTERNAL FIXATION (ORIF) WRIST FRACTURE
Anesthesia: Monitor Anesthesia Care | Site: Wrist | Laterality: Right

## 2021-09-24 MED ORDER — LIDOCAINE HCL (PF) 1 % IJ SOLN
INTRAMUSCULAR | Status: DC | PRN
  Administered 2021-09-24: 1 mL via SUBCUTANEOUS

## 2021-09-24 MED ORDER — MORPHINE SULFATE (PF) 2 MG/ML IV SOLN: 0.5000 mg | INTRAVENOUS | Status: DC | PRN

## 2021-09-24 MED ORDER — PROPOFOL 1000 MG/100ML IV EMUL
INTRAVENOUS | Status: AC
Start: 2021-09-24 — End: ?
  Filled 2021-09-24: qty 100

## 2021-09-24 MED ORDER — BUPIVACAINE HCL (PF) 0.5 % IJ SOLN
INTRAMUSCULAR | Status: AC
  Filled 2021-09-24: qty 20

## 2021-09-24 MED ORDER — VANCOMYCIN HCL IN DEXTROSE 1-5 GM/200ML-% IV SOLN
INTRAVENOUS | Status: AC
  Filled 2021-09-24: qty 200

## 2021-09-24 MED ORDER — METOCLOPRAMIDE HCL 10 MG PO TABS: 5.0000 mg | ORAL_TABLET | Freq: Three times a day (TID) | ORAL | Status: DC | PRN

## 2021-09-24 MED ORDER — ORAL CARE MOUTH RINSE
15.0000 mL | Freq: Once | OROMUCOSAL | Status: AC
Start: 2021-09-24 — End: 2021-09-24

## 2021-09-24 MED ORDER — ACETAMINOPHEN 10 MG/ML IV SOLN: 1000.0000 mg | Freq: Once | INTRAVENOUS | Status: DC | PRN

## 2021-09-24 MED ORDER — BUPIVACAINE HCL (PF) 0.5 % IJ SOLN
INTRAMUSCULAR | Status: DC | PRN
  Administered 2021-09-24: 20 mL via PERINEURAL

## 2021-09-24 MED ORDER — LACTATED RINGERS IV SOLN: INTRAVENOUS | Status: DC

## 2021-09-24 MED ORDER — FENTANYL CITRATE (PF) 100 MCG/2ML IJ SOLN
INTRAMUSCULAR | Status: DC | PRN
  Administered 2021-09-24 (×3): 25 ug via INTRAVENOUS

## 2021-09-24 MED ORDER — HYDROCODONE-ACETAMINOPHEN 7.5-325 MG PO TABS: 1.0000 | ORAL_TABLET | ORAL | Status: DC | PRN

## 2021-09-24 MED ORDER — METOCLOPRAMIDE HCL 5 MG/ML IJ SOLN: 5.0000 mg | Freq: Three times a day (TID) | INTRAMUSCULAR | Status: DC | PRN

## 2021-09-24 MED ORDER — CHLORHEXIDINE GLUCONATE 0.12 % MT SOLN: 15.0000 mL | Freq: Once | OROMUCOSAL | Status: AC

## 2021-09-24 MED ORDER — 0.9 % SODIUM CHLORIDE (POUR BTL) OPTIME
TOPICAL | Status: DC | PRN
  Administered 2021-09-24: 500 mL

## 2021-09-24 MED ORDER — ONDANSETRON HCL 4 MG PO TABS: 4.0000 mg | ORAL_TABLET | Freq: Four times a day (QID) | ORAL | Status: DC | PRN

## 2021-09-24 MED ORDER — ONDANSETRON HCL 4 MG/2ML IJ SOLN: 4.0000 mg | Freq: Four times a day (QID) | INTRAMUSCULAR | Status: DC | PRN

## 2021-09-24 MED ORDER — FENTANYL CITRATE PF 50 MCG/ML IJ SOSY: 50.0000 ug | PREFILLED_SYRINGE | INTRAMUSCULAR | Status: DC | PRN

## 2021-09-24 MED ORDER — CHLORHEXIDINE GLUCONATE 0.12 % MT SOLN
OROMUCOSAL | Status: AC
  Administered 2021-09-24: 15 mL via OROMUCOSAL
  Filled 2021-09-24: qty 15

## 2021-09-24 MED ORDER — PROPOFOL 500 MG/50ML IV EMUL
INTRAVENOUS | Status: DC | PRN
  Administered 2021-09-24: 50 ug/kg/min via INTRAVENOUS

## 2021-09-24 MED ORDER — HYDROCODONE-ACETAMINOPHEN 5-325 MG PO TABS: 1.0000 | ORAL_TABLET | ORAL | Status: DC | PRN

## 2021-09-24 MED ORDER — SODIUM CHLORIDE 0.9 % IV SOLN
INTRAVENOUS | Status: DC
Start: 2021-09-24 — End: 2021-09-24

## 2021-09-24 MED ORDER — FENTANYL CITRATE PF 50 MCG/ML IJ SOSY
PREFILLED_SYRINGE | INTRAMUSCULAR | Status: AC
  Filled 2021-09-24: qty 1

## 2021-09-24 MED ORDER — MIDAZOLAM HCL 2 MG/2ML IJ SOLN
INTRAMUSCULAR | Status: AC
  Filled 2021-09-24: qty 2

## 2021-09-24 MED ORDER — DROPERIDOL 2.5 MG/ML IJ SOLN
0.6250 mg | Freq: Once | INTRAMUSCULAR | Status: DC | PRN
Start: 2021-09-24 — End: 2021-09-24

## 2021-09-24 MED ORDER — ACETAMINOPHEN 325 MG PO TABS: 325.0000 mg | ORAL_TABLET | Freq: Four times a day (QID) | ORAL | Status: DC | PRN

## 2021-09-24 MED ORDER — FENTANYL CITRATE (PF) 100 MCG/2ML IJ SOLN
INTRAMUSCULAR | Status: AC
  Filled 2021-09-24: qty 2

## 2021-09-24 MED ORDER — LIDOCAINE HCL (PF) 1 % IJ SOLN
INTRAMUSCULAR | Status: AC
  Filled 2021-09-24: qty 5

## 2021-09-24 MED ORDER — HYDROCODONE-ACETAMINOPHEN 5-325 MG PO TABS: 1.0000 | ORAL_TABLET | Freq: Four times a day (QID) | ORAL | 0 refills | Status: DC | PRN

## 2021-09-24 MED ORDER — PROMETHAZINE HCL 25 MG/ML IJ SOLN: 6.2500 mg | INTRAMUSCULAR | Status: DC | PRN

## 2021-09-24 SURGICAL SUPPLY — 46 items
APL PRP STRL LF DISP 70% ISPRP (MISCELLANEOUS) ×1
BIT DRILL 2 FAST STEP (BIT) ×1 IMPLANT
BIT DRILL 2.5X4 QC (BIT) ×1 IMPLANT
BNDG CMPR STD VLCR NS LF 5.8X4 (GAUZE/BANDAGES/DRESSINGS) ×1
BNDG ELASTIC 4X5.8 VLCR NS LF (GAUZE/BANDAGES/DRESSINGS) ×2 IMPLANT
BNDG ELASTIC 4X5.8 VLCR STR LF (GAUZE/BANDAGES/DRESSINGS) ×2 IMPLANT
CHLORAPREP W/TINT 26 (MISCELLANEOUS) ×2 IMPLANT
CUFF TOURN SGL QUICK 18X4 (TOURNIQUET CUFF) IMPLANT
DRAPE FLUOR MINI C-ARM 54X84 (DRAPES) ×2 IMPLANT
ELECT CAUTERY BLADE 6.4 (BLADE) ×2 IMPLANT
ELECT REM PT RETURN 9FT ADLT (ELECTROSURGICAL) ×2
ELECTRODE REM PT RTRN 9FT ADLT (ELECTROSURGICAL) ×1 IMPLANT
GAUZE SPONGE 4X4 12PLY STRL (GAUZE/BANDAGES/DRESSINGS) ×2 IMPLANT
GAUZE XEROFORM 1X8 LF (GAUZE/BANDAGES/DRESSINGS) ×2 IMPLANT
GLOVE SURG SYN 9.0  PF PI (GLOVE) ×1
GLOVE SURG SYN 9.0 PF PI (GLOVE) ×1 IMPLANT
GOWN SRG 2XL LVL 4 RGLN SLV (GOWNS) ×1 IMPLANT
GOWN STRL NON-REIN 2XL LVL4 (GOWNS) ×2
GOWN STRL REUS W/ TWL LRG LVL3 (GOWN DISPOSABLE) ×1 IMPLANT
GOWN STRL REUS W/TWL LRG LVL3 (GOWN DISPOSABLE) ×2
K-WIRE ACE 1.6X6 (WIRE) ×2
KIT TURNOVER KIT A (KITS) ×2 IMPLANT
KWIRE ACE 1.6X6 (WIRE) IMPLANT
MANIFOLD NEPTUNE II (INSTRUMENTS) ×2 IMPLANT
NDL FILTER BLUNT 18X1 1/2 (NEEDLE) ×1 IMPLANT
NEEDLE FILTER BLUNT 18X 1/2SAF (NEEDLE)
NEEDLE FILTER BLUNT 18X1 1/2 (NEEDLE) IMPLANT
NS IRRIG 500ML POUR BTL (IV SOLUTION) ×2 IMPLANT
PACK EXTREMITY ARMC (MISCELLANEOUS) ×2 IMPLANT
PAD CAST CTTN 4X4 STRL (SOFTGOODS) ×1 IMPLANT
PADDING CAST COTTON 4X4 STRL (SOFTGOODS) ×2
PEG SUBCHONDRAL SMOOTH 2.0X14 (Peg) ×1 IMPLANT
PEG SUBCHONDRAL SMOOTH 2.0X16 (Peg) ×1 IMPLANT
PEG SUBCHONDRAL SMOOTH 2.0X18 (Peg) ×1 IMPLANT
PEG SUBCHONDRAL SMOOTH 2.0X20 (Peg) ×2 IMPLANT
PEG SUBCHONDRAL SMOOTH 2.0X22 (Peg) ×1 IMPLANT
PLATE SHORT 21.6X48.9 NRRW RT (Plate) ×1 IMPLANT
SCALPEL PROTECTED #15 DISP (BLADE) ×4 IMPLANT
SCREW CORT 3.5X10 LNG (Screw) ×3 IMPLANT
SPLINT CAST 1 STEP 3X12 (MISCELLANEOUS) ×2 IMPLANT
SUT ETHILON 4-0 (SUTURE) ×2
SUT ETHILON 4-0 FS2 18XMFL BLK (SUTURE) ×1
SUT VICRYL 3-0 27IN (SUTURE) ×2 IMPLANT
SUTURE ETHLN 4-0 FS2 18XMF BLK (SUTURE) ×1 IMPLANT
SYR 3ML LL SCALE MARK (SYRINGE) ×2 IMPLANT
WATER STERILE IRR 500ML POUR (IV SOLUTION) ×2 IMPLANT

## 2021-09-24 NOTE — Anesthesia Preprocedure Evaluation (Addendum)
Anesthesia Evaluation  Patient identified by MRN, date of birth, ID band Patient awake    Reviewed: Allergy & Precautions, NPO status , Patient's Chart, lab work & pertinent test results, reviewed documented beta blocker date and time   Airway Mallampati: III  TM Distance: >3 FB Neck ROM: full    Dental  (+) Lower Dentures, Upper Dentures   Pulmonary shortness of breath and with exertion, COPD,  oxygen dependent, former smoker,  H/o TB treated with INH  Nightly 2L o2   Pulmonary exam normal        Cardiovascular Exercise Tolerance: Poor hypertension, Pt. on home beta blockers and Pt. on medications pulmonary hypertension (h/o pulm htn)+CHF  + dysrhythmias Atrial Fibrillation + Valvular Problems/Murmurs MR  Rhythm:Regular Rate:Normal - Systolic murmurs ECHO 5974: 1. Left ventricular ejection fraction, by estimation, is 55 to 60%. The  left ventricle has normal function. The left ventricle has no regional  wall motion abnormalities. There is mild left ventricular hypertrophy.  Indeterminate diastolic filling due to  E-A fusion.  2. Right ventricular systolic function is normal. The right ventricular  size is normal. Mildly increased right ventricular wall thickness. There  is normal pulmonary artery systolic pressure.  3. The mitral valve was not well visualized. No evidence of mitral valve  regurgitation.  4. Tricuspid valve regurgitation is mild to moderate.  5. The aortic valve was not well visualized. Aortic valve regurgitation  is not visualized. No aortic stenosis is present.  6. Pulmonic valve regurgitation not well assessed.  7. The inferior vena cava is normal in size with greater than 50%  respiratory variability, suggesting right atrial pressure of 3 mmHg.  EKG 5/23: Sinus rhythm Ventricular premature complex IVCD, consider atypical RBBB Baseline wander in lead(s) I III aVL V2   Neuro/Psych negative  neurological ROS  negative psych ROS   GI/Hepatic Neg liver ROS, GERD  Medicated,  Endo/Other  negative endocrine ROS  Renal/GU CRFRenal disease  negative genitourinary   Musculoskeletal   Abdominal Normal abdominal exam  (+)   Peds  Hematology  (+) Blood dyscrasia, anemia ,   Anesthesia Other Findings RIGHT DISTAL RADIUS FRACTURE  Past Medical History: No date: A-fib ([place]) No date: Allergy No date: Asthma No date: CHF (congestive heart failure) ([facility]) No date: COPD (chronic obstructive pulmonary disease) ([facility]) No date: GERD (gastroesophageal reflux disease) No date: Hypercholesteremia No date: Hypertension No date: Mitral valve insufficiency No date: Pulmonary hypertension ([place])     Comment:  Right Ventricular systolic pressure at 60 mm Hg by echo               on july of 2011; Right heart cardic catherization               revealed pulmonary artery pressusre of 27/10 with a mean               of; Ventricular pressure 29/10 with pulmonary capillary               wedge at 9 No date: TB (pulmonary tuberculosis)     Comment:  treated with INH therapy   Past Surgical History: 10/2016: CARDIAC CATHETERIZATION     Comment:  [facility] 2008: CATARACT EXTRACTION; Bilateral No date: CHOLECYSTECTOMY 1964: TONSILLECTOMY     Reproductive/Obstetrics negative OB ROS                           Anesthesia Physical Anesthesia Plan  ASA: 3  Anesthesia Plan: MAC   Post-op Pain Management: Regional block*   Induction: Intravenous  PONV Risk Score and Plan: Propofol infusion and TIVA  Airway Management Planned: Natural Airway and Nasal Cannula  Additional Equipment:   Intra-op Plan:   Post-operative Plan:   Informed Consent:     Dental Advisory Given  Plan Discussed with: Anesthesiologist, CRNA and Surgeon  Anesthesia Plan Comments:         Anesthesia Quick Evaluation

## 2021-09-24 NOTE — Op Note (Signed)
09/24/2021  1:12 PM  PATIENT:  Anita Ferrell  88 y.o. female  PRE-OPERATIVE DIAGNOSIS:  Closed fracture of distal end of right radius, unspecified fracture morphology, initial encounter S52.501A  POST-OPERATIVE DIAGNOSIS:  Closed fracture of distal end of right radius, unspecified fracture morphology, initial encounter S52.501A  PROCEDURE:  Procedure(s): OPEN REDUCTION INTERNAL FIXATION OF RIGHT DISTAL RADIUS FRACTURE (Right)  SURGEON: Laurene Footman, MD  ASSISTANTS: None  ANESTHESIA:   paracervical block  EBL:  Total I/O In: 600 [I.V.:400; IV Piggyback:200] Out: 5 [Blood:5]  BLOOD ADMINISTERED:none  DRAINS: none   LOCAL MEDICATIONS USED:  NONE  SPECIMEN:  No Specimen  DISPOSITION OF SPECIMEN:  N/A  COUNTS:  YES  TOURNIQUET:   Total Tourniquet Time Documented: Upper Arm (Right) - 23 minutes Total: Upper Arm (Right) - 23 minutes   IMPLANTS: [employer] short narrow DVR plate with multiple smooth pegs and screws  DICTATION: .Dragon Dictation patient was brought to the operating room after a block of been obtained in the preop holding area.  The right arm was prepped and draped in the usual sterile manner with a tourniquet applied the upper arm.  After prepping and draping in the usual sterile manner appropriate patient identification and timeout procedures were completed.  Tourniquet was raised and a volar approach was made with traction applied to the middle and index fingers with 10 pounds of traction of the end of the table.  Incision was centered over the FCR tendon and the tendon sheath incised and retracted radially protect the radial artery and associated veins.  Deep fascia was then incised and the distal shaft exposed with further dissection the distal fragment was noted to be quite dorsally displaced with shortening.  With traction applied some of the length was restored and with a distal first technique the plate applied and all distal holes filled the  plate plate could be brought down and restoration of length and radial inclination was obtained there still was some loss of volar tilt.  With the shaft portion of the plate brought down to 310 mm screws were inserted after traction was released to prevent over distraction of the fracture site.  Mini C arm views were utilized and with oblique views there is no penetration into the joint with viscous smooth pegs in the subchondral bone.  The hand was moved and there did not appear to be motion at the fracture site.  The tourniquet was let down and the wound irrigated and closed with 3-0 Vicryl in a simple interrupted manner followed by 4-0 nylon for the skin Xeroform 4 x 4's web roll and volar splint with an Ace wrap.  X-rays were taken intraoperatively with the mini C arm to be saved.  PLAN OF CARE: Discharge to home after PACU  PATIENT DISPOSITION:  PACU - hemodynamically stable.

## 2021-09-24 NOTE — H&P (Signed)
Chief Complaint  Patient presents with   Wrist Injury  RIGHT WRIST INJURY; DOI: 09/13/2021   History of Present Illness:   Anita Ferrell is a 88 y.o. female that presents 7 days after sustaining a right distal radius fracture on 09/13/21. Patient does have history of CKD Stage IV, mitral regurgitation, bradycardia, a-fib, and COPD. The patient presents with her son.  Patient states she was walking to the bathroom at home when she fell, landing on the outstretched right hand. Patient transported to the ED via EMS where xrays showed displaced distal radius fracture. Reduction was performed with improved alignment and a splint applied. Patient presents today with her husband. Patient is right-handed.   Patient states she has been compliant with use of the splint. She has been taking Tylenol as needed for her pain. Denies any numbness, tingling to the right hand.   Patient reports a penicillin allergy. States she had swelling after injection of penicillin in the 1980s. Patient has history of osteoporosis which she takes Prolio for.   Past Medical, Surgical, Family, Social History, Allergies, Medications:   Past Medical History:  Past Medical History:  Diagnosis Date   Allergic sinusitis   Asthma without status asthmaticus   Bleeding internal hemorrhoids   Colon polyps 05/07/2014  Sessile serrated adenoma, Tubular Adenoma and Hyperplastic polyp   COPD (chronic obstructive pulmonary disease) (CMS-HCC)   Diverticulosis 05/07/2014  Sigmoid colon, descending colon and transverse colon   GERD (gastroesophageal reflux disease)   Hypercholesterolemia   Internal hemorrhoids 05/07/2014  non-bleeding   Irregular Z line of esophagus 05/07/2014   Nephrolithiasis   Osteoporosis   Pulmonary hypertension (CMS-HCC)   Redundant colon 05/07/2014   Rhinitis, allergic   Skin tag of perianal region 05/07/2014   TB (tuberculosis)  treated with INH therapy   Past Surgical History:  Past Surgical  History:  Procedure Laterality Date   [place] 2010  patient reported   CATARACT EXTRACTION   COLONOSCOPY 03/25/1999, 09/01/2007, 09/21/2011, and 05/07/2014  Sessile Serrated Adenoma/Tubular adenoma noted in 2016, Repeat 61yr(2021) if desired/MUS   EGD 09/01/2007 ([facility]), 09/02/2008, 09/21/2011, 05/07/2014  most recent negative   Internal hemorrhoid rubberband ligation 12/11/2012 by Dr. WRochel Brome  Right heart cath  Right heart cath mean pulmonary pressure 17; elevated right ventricular systolic pressure by echo.   Current Medications:  Current Outpatient Medications  Medication Sig Dispense Refill   acetaminophen (TYLENOL) 500 MG tablet Take 500 mg by mouth every 6 (six) hours as needed.    albuterol (PROVENTIL) 2.5 mg /3 mL (0.083 %) nebulizer solution INHALE CONTENTS 1 VIAL VIA NEBULIZER EVERY 6 HOURS AS NEEDED WHEEZE/SHORTNESS OF BREATH J44.9   albuterol 90 mcg/actuation inhaler Inhale 2 inhalations into the lungs every 6 (six) hours as needed   aspirin 81 MG EC tablet Take 81 mg by mouth once daily.    azelastine (ASTELIN) 137 mcg nasal spray 1 spray by Nasal route 2 (two) times daily.    Bifidobacterium infantis (ALIGN) 4 mg capsule Take 1 capsule by mouth once daily.    calcium carbonate-vitamin D3 (CALTRATE 600+D) 600 mg(1,'500mg'$ ) -400 unit tablet Take 1 tablet by mouth once daily.    calcium carbonate-vitamin D3 (CALTRATE 600+D) 600 mg-10 mcg (400 unit) tablet Take 1 tablet by mouth at bedtime   cranberry fruit extract (CRANBERRY EXTRACT) 500 mg Tab Take 500 mg by mouth daily with breakfast   docusate (COLACE) 100 MG capsule Take 100 mg by mouth 2 (two)  times daily as needed for Constipation.   famotidine (PEPCID) 20 MG tablet Take 20 mg by mouth 2 (two) times daily   ferrous fumarate 324 mg (106 mg iron) tablet Take 324 mg by mouth 2 (two) times daily with meals.    fexofenadine (ALLEGRA) 180 MG tablet Take 180 mg by mouth once daily.     fluticasone-umeclidinium-vilanterol (TRELEGY ELLIPTA) 100-62.5-25 mcg inhaler Inhale 1 inhalation into the lungs once daily   FUROsemide (LASIX) 40 MG tablet Take one tab every other day or daily as needed. 30 tablet 11   lansoprazole (PREVACID) 30 MG DR capsule Take 60 mg by mouth once daily.    meclizine (ANTIVERT) 25 mg tablet Take 25 mg by mouth as needed.    metoprolol tartrate (LOPRESSOR) 50 MG tablet Take 0.5 tablets (25 mg total) by mouth 2 (two) times daily. 30 tablet 11   miscellaneous medical supply Misc Inhale 2 L into the lungs at bedtime as needed   multivit-min/iron/folic/lutein (CENTRUM SILVER WOMEN ORAL) Take 1 tablet by mouth once daily.   rosuvastatin (CRESTOR) 10 MG tablet Take 10 mg by mouth once daily 1   traMADoL (ULTRAM) 50 mg tablet Take 50 mg by mouth every 6 (six) hours as needed   cranberry 500 mg Cap Take by mouth   cranberry 500 mg Cap Take by mouth   famotidine (PEPCID) 20 MG tablet Take by mouth   FUROsemide (LASIX) 40 MG tablet Take by mouth   lansoprazole (PREVACID) 30 MG DR capsule Take by mouth   methylPREDNISolone (MEDROL DOSEPACK) 4 mg tablet   metoprolol tartrate (LOPRESSOR) 50 MG tablet Take 0.5 tablets by mouth 2 (two) times daily   sacubitril-valsartan (ENTRESTO) 24-26 mg tablet Take 1 tablet by mouth 2 (two) times daily   spironolactone (ALDACTONE) 25 MG tablet Take 0.5 tablets (12.5 mg total) by mouth once daily. 15 tablet 11   spironolactone (ALDACTONE) 25 MG tablet Take 0.5 tablets by mouth once daily   umeclidinium-vilanteroL (ANORO ELLIPTA) 62.5-25 mcg/actuation inhaler   No current facility-administered medications for this visit.   Allergies:  Allergies  Allergen Reactions   Penicillin G Potassium Swelling   Social History:  Social History   Socioeconomic History   Marital status: Widowed  Tobacco Use   Smoking status: Former  Packs/day: 1.00  Years: 40.00  Pack years: 40.00  Types: Cigarettes   Smokeless tobacco: Never   Vaping Use   Vaping Use: Never used  Substance and Sexual Activity   Alcohol use: No   Drug use: No   Sexual activity: Defer   Family History:  Family History  Problem Relation Age of Onset   Coronary Artery Disease (Blocked arteries around heart) Mother   Coronary Artery Disease (Blocked arteries around heart) Father   Coronary Artery Disease (Blocked arteries around heart) Brother   High blood pressure (Hypertension) Sister   Breast cancer Maternal Aunt   Colon cancer Neg Hx   Colon polyps Neg Hx   Liver disease Neg Hx   Rectal cancer Neg Hx   Ulcers Neg Hx   Review of Systems:   A 10+ ROS was performed, reviewed, and the pertinent orthopaedic findings are documented in the HPI.   Physical Examination:   BP 120/70 (BP Location: Left upper arm, Patient Position: Sitting, BP Cuff Size: Adult)  Ht 160 cm ('5\' 3"'$ )  Wt 61.7 kg (136 lb)  BMI 24.09 kg/m   The patient is a well-developed, well-nourished Female in no acute distress. The patient is  alert and oriented to person, place and time. The patient is able to flex and extend the right elbow.  Patient presents in a right sugar tong splint. Following splint removal, skin over the wrist is clean and dry. Moderate edema and significant ecchymosis noted over the wrist and forearm. ROM of the wrist deferred. Patient able to flex and extend fingers. Radial pulse 2+. Sensation intact to pinprick over the median nerve distribution. Cardiac: Slightly tachycardic rate with irregular rhythm. No murmurs/rubs/gallops. Respiratory: Lungs clear to auscultation bilaterally.   Tests Performed/Reviewed:  X-rays  3 views of the right wrist were obtained. Images reveal displaced, comminuted distal radius fracture with significant dorsal angulation. Minimally displaced ulna styloid fracture also noted.  I personally reviewed and visualized the imaging studies if available. I additionally personally interpreted any radiographs taken during  today's visit.  Impression:   ICD-10-CM  1. Closed fracture of distal end of right radius, unspecified fracture morphology, initial encounter S52.501A   Plan:   -displaced right distal radius fracture sustained 09/13/21 Splint replaced over right forearm. Treatment options, both operative and non-operative, were discussed with the patient. Advised that non-operative treatment would result in loss of right wrist function given degree of angulation of the fracture site. Patient would benefit from open reduction, internal fixation of the right distal radius with Dr Rudene Christians. Risks of surgery, including infection and nerve damage, were discussed with the patient. Patient would like to move forward with surgery.   Follow-up for post-operative visit.  Contact our office with any questions or concerns. Follow up as indicated, or sooner should any new problems arise, if conditions worsen, or if they are otherwise concerned.   Gwenlyn Fudge, PA-C [facility] [address] [place], [place] [address] Phone: [phone]  This note was generated in part with voice recognition software and I apologize for any typographical errors that were not detected and corrected.   Electronically signed by Gwenlyn Fudge, PA at 09/22/2021 1:16 PM EDT  Reviewed  H+P. No changes noted.

## 2021-09-24 NOTE — Discharge Instructions (Addendum)
Keep arm elevated is much as possible through the weekend Ice to the back of the wrist may help with pain today and tomorrow Work on finger motion all you can Pain medicine as directed Call office if you are having problems   AMBULATORY SURGERY  DISCHARGE INSTRUCTIONS   The drugs that you were given will stay in your system until tomorrow so for the next 24 hours you should not:  Drive an automobile Make any legal decisions Drink any alcoholic beverage   You may resume regular meals tomorrow.  Today it is better to start with liquids and gradually work up to solid foods.  You may eat anything you prefer, but it is better to start with liquids, then soup and crackers, and gradually work up to solid foods.   Please notify your doctor immediately if you have any unusual bleeding, trouble breathing, redness and pain at the surgery site, drainage, fever, or pain not relieved by medication.    Additional Instructions:        Please contact your physician with any problems or Same Day Surgery at [phone], Monday through Friday 6 am to 4 pm, or [place] at [facility] number at [phone].

## 2021-09-24 NOTE — Anesthesia Procedure Notes (Addendum)
Anesthesia Regional Block: Supraclavicular block   Pre-Anesthetic Checklist: , timeout performed,  Correct Patient, Correct Site, Correct Laterality,  Correct Procedure, Correct Position, site marked,  Risks and benefits discussed,  Surgical consent,  Pre-op evaluation,  At surgeon's request and post-op pain management  Laterality: Upper and Right  Prep: chloraprep       Needles:  Injection technique: Single-shot  Needle Type: Stimiplex     Needle Length: 9cm  Needle Gauge: 22     Additional Needles:   Procedures:,,,, ultrasound used (permanent image in chart),,    Narrative:  Start time: 09/24/2021 10:50 AM End time: 09/24/2021 10:53 AM Injection made incrementally with aspirations every 20 mL.  Performed by: Personally  Anesthesiologist: Iran Ouch, MD  Additional Notes: Patient consented for risk and benefits of nerve block including but not limited to nerve damage, failed block, bleeding and infection.  Patient voiced understanding.  Functioning IV was confirmed and monitors were applied.  Timeout done prior to procedure and prior to any sedation being given to the patient.  Patient confirmed procedure site prior to any sedation given to the patient.  A 82m 22ga Stimuplex needle was used. Sterile prep,hand hygiene and sterile gloves were used.  Minimal sedation used for procedure.  No paresthesia endorsed by patient during the procedure.  Negative aspiration and negative test dose prior to incremental administration of local anesthetic. The patient tolerated the procedure well with no immediate complications.

## 2021-09-24 NOTE — Transfer of Care (Signed)
Immediate Anesthesia Transfer of Care Note  Patient: Anita Ferrell  Procedure(s) Performed: OPEN REDUCTION INTERNAL FIXATION OF RIGHT DISTAL RADIUS FRACTURE (Right: Wrist)  Patient Location: PACU  Anesthesia Type:Regional  Level of Consciousness: awake and drowsy  Airway & Oxygen Therapy: Patient Spontanous Breathing and Patient connected to nasal cannula oxygen  Post-op Assessment: Report given to RN and Post -op Vital signs reviewed and stable  Post vital signs: Reviewed and stable  Last Vitals:  Vitals Value Taken Time  BP 105/61 09/24/21 1307  Temp    Pulse 90 09/24/21 1311  Resp 25 09/24/21 1312  SpO2 99 % 09/24/21 1311  Vitals shown include unvalidated device data.  Last Pain:  Vitals:   09/24/21 1057  TempSrc:   PainSc: 0-No pain         Complications: No notable events documented.

## 2021-09-25 NOTE — Anesthesia Postprocedure Evaluation (Signed)
Anesthesia Post Note  Patient: Anita Ferrell  Procedure(s) Performed: OPEN REDUCTION INTERNAL FIXATION OF RIGHT DISTAL RADIUS FRACTURE (Right: Wrist)  Patient location during evaluation: PACU Anesthesia Type: MAC Level of consciousness: awake and alert Pain management: pain level controlled Vital Signs Assessment: post-procedure vital signs reviewed and stable Respiratory status: spontaneous breathing, nonlabored ventilation and respiratory function stable Cardiovascular status: blood pressure returned to baseline and stable Postop Assessment: no apparent nausea or vomiting Anesthetic complications: no   No notable events documented.   Last Vitals:  Vitals:   09/24/21 1330 09/24/21 1345  BP: 106/65 126/62  Pulse: 85 87  Resp: 18 18  Temp: 36.9 C 36.5 C  SpO2: 94% 93%    Last Pain:  Vitals:   09/24/21 1345  TempSrc: Temporal  PainSc: [place]

## 2021-09-28 ENCOUNTER — Encounter: Payer: Self-pay | Admitting: Orthopedic Surgery

## 2021-09-29 DIAGNOSIS — Z9889 Other specified postprocedural states: Secondary | ICD-10-CM | POA: Diagnosis not present

## 2021-09-29 DIAGNOSIS — Z8781 Personal history of (healed) traumatic fracture: Secondary | ICD-10-CM | POA: Diagnosis not present

## 2021-09-29 DIAGNOSIS — S52501D Unspecified fracture of the lower end of right radius, subsequent encounter for closed fracture with routine healing: Secondary | ICD-10-CM | POA: Diagnosis not present

## 2021-10-09 DIAGNOSIS — S52501D Unspecified fracture of the lower end of right radius, subsequent encounter for closed fracture with routine healing: Secondary | ICD-10-CM | POA: Diagnosis not present

## 2021-10-09 DIAGNOSIS — Z8781 Personal history of (healed) traumatic fracture: Secondary | ICD-10-CM | POA: Diagnosis not present

## 2021-10-09 DIAGNOSIS — Z9889 Other specified postprocedural states: Secondary | ICD-10-CM | POA: Diagnosis not present

## 2021-10-28 DIAGNOSIS — Z9889 Other specified postprocedural states: Secondary | ICD-10-CM | POA: Diagnosis not present

## 2021-10-28 DIAGNOSIS — Z8781 Personal history of (healed) traumatic fracture: Secondary | ICD-10-CM | POA: Diagnosis not present

## 2021-10-28 DIAGNOSIS — S52501A Unspecified fracture of the lower end of right radius, initial encounter for closed fracture: Secondary | ICD-10-CM | POA: Diagnosis not present

## 2021-11-22 ENCOUNTER — Other Ambulatory Visit: Payer: Self-pay | Admitting: Cardiovascular Disease

## 2021-11-23 DIAGNOSIS — Z8781 Personal history of (healed) traumatic fracture: Secondary | ICD-10-CM | POA: Diagnosis not present

## 2021-11-23 DIAGNOSIS — S52501A Unspecified fracture of the lower end of right radius, initial encounter for closed fracture: Secondary | ICD-10-CM | POA: Diagnosis not present

## 2021-11-23 DIAGNOSIS — M810 Age-related osteoporosis without current pathological fracture: Secondary | ICD-10-CM | POA: Diagnosis not present

## 2021-11-23 DIAGNOSIS — Z9889 Other specified postprocedural states: Secondary | ICD-10-CM | POA: Diagnosis not present

## 2021-12-04 ENCOUNTER — Ambulatory Visit: Payer: Medicare Other | Admitting: Internal Medicine

## 2021-12-04 ENCOUNTER — Other Ambulatory Visit: Payer: Self-pay | Admitting: Pulmonary Disease

## 2021-12-25 ENCOUNTER — Other Ambulatory Visit: Payer: Self-pay | Admitting: Cardiovascular Disease

## 2021-12-27 ENCOUNTER — Other Ambulatory Visit: Payer: Self-pay | Admitting: Cardiovascular Disease

## 2022-01-02 ENCOUNTER — Other Ambulatory Visit: Payer: Self-pay | Admitting: Pulmonary Disease

## 2022-01-19 ENCOUNTER — Other Ambulatory Visit: Payer: Self-pay | Admitting: Cardiovascular Disease

## 2022-02-01 ENCOUNTER — Encounter: Payer: Self-pay | Admitting: Pulmonary Disease

## 2022-02-01 ENCOUNTER — Ambulatory Visit (INDEPENDENT_AMBULATORY_CARE_PROVIDER_SITE_OTHER): Payer: Medicare Other | Admitting: Pulmonary Disease

## 2022-02-01 VITALS — BP 120/80 | HR 89 | Temp 97.5°F | Ht 63.0 in | Wt 132.2 lb

## 2022-02-01 DIAGNOSIS — J4489 Other specified chronic obstructive pulmonary disease: Secondary | ICD-10-CM | POA: Diagnosis not present

## 2022-02-01 DIAGNOSIS — G4736 Sleep related hypoventilation in conditions classified elsewhere: Secondary | ICD-10-CM

## 2022-02-01 DIAGNOSIS — J449 Chronic obstructive pulmonary disease, unspecified: Secondary | ICD-10-CM | POA: Diagnosis not present

## 2022-02-01 DIAGNOSIS — Z23 Encounter for immunization: Secondary | ICD-10-CM

## 2022-02-01 DIAGNOSIS — G4734 Idiopathic sleep related nonobstructive alveolar hypoventilation: Secondary | ICD-10-CM

## 2022-02-01 DIAGNOSIS — R0602 Shortness of breath: Secondary | ICD-10-CM

## 2022-02-01 NOTE — Patient Instructions (Addendum)
Continue using your Trelegy and as needed albuterol.  Continue oxygen at nighttime.  You received your flu vaccine today.  We will see you in follow-up in 6 months time call sooner should any new problems arise.

## 2022-02-03 ENCOUNTER — Ambulatory Visit (INDEPENDENT_AMBULATORY_CARE_PROVIDER_SITE_OTHER): Payer: Medicare Other | Admitting: Internal Medicine

## 2022-02-03 ENCOUNTER — Encounter: Payer: Self-pay | Admitting: Internal Medicine

## 2022-02-03 VITALS — BP 118/58 | HR 91 | Temp 97.5°F | Ht 63.0 in | Wt 131.2 lb

## 2022-02-03 DIAGNOSIS — E7800 Pure hypercholesterolemia, unspecified: Secondary | ICD-10-CM

## 2022-02-03 DIAGNOSIS — I10 Essential (primary) hypertension: Secondary | ICD-10-CM | POA: Diagnosis not present

## 2022-02-03 DIAGNOSIS — J449 Chronic obstructive pulmonary disease, unspecified: Secondary | ICD-10-CM

## 2022-02-03 DIAGNOSIS — N200 Calculus of kidney: Secondary | ICD-10-CM

## 2022-02-03 DIAGNOSIS — M810 Age-related osteoporosis without current pathological fracture: Secondary | ICD-10-CM

## 2022-02-03 DIAGNOSIS — I272 Pulmonary hypertension, unspecified: Secondary | ICD-10-CM | POA: Diagnosis not present

## 2022-02-03 DIAGNOSIS — R739 Hyperglycemia, unspecified: Secondary | ICD-10-CM | POA: Diagnosis not present

## 2022-02-03 DIAGNOSIS — N183 Chronic kidney disease, stage 3 unspecified: Secondary | ICD-10-CM

## 2022-02-03 DIAGNOSIS — D649 Anemia, unspecified: Secondary | ICD-10-CM | POA: Diagnosis not present

## 2022-02-03 DIAGNOSIS — G4734 Idiopathic sleep related nonobstructive alveolar hypoventilation: Secondary | ICD-10-CM | POA: Diagnosis not present

## 2022-02-03 DIAGNOSIS — K2100 Gastro-esophageal reflux disease with esophagitis, without bleeding: Secondary | ICD-10-CM | POA: Diagnosis not present

## 2022-02-03 LAB — CBC WITH DIFFERENTIAL/PLATELET
Basophils Absolute: 0 10*3/uL (ref 0.0–0.1)
Basophils Relative: 0.6 % (ref 0.0–3.0)
Eosinophils Absolute: 0.2 10*3/uL (ref 0.0–0.7)
Eosinophils Relative: 6.7 % — ABNORMAL HIGH (ref 0.0–5.0)
HCT: 28.1 % — ABNORMAL LOW (ref 36.0–46.0)
Hemoglobin: 9 g/dL — ABNORMAL LOW (ref 12.0–15.0)
Lymphocytes Relative: 19.5 % (ref 12.0–46.0)
Lymphs Abs: 0.7 10*3/uL (ref 0.7–4.0)
MCHC: 31.9 g/dL (ref 30.0–36.0)
MCV: 79.9 fl (ref 78.0–100.0)
Monocytes Absolute: 0.3 10*3/uL (ref 0.1–1.0)
Monocytes Relative: 8.2 % (ref 3.0–12.0)
Neutro Abs: 2.3 10*3/uL (ref 1.4–7.7)
Neutrophils Relative %: 65 % (ref 43.0–77.0)
Platelets: 193 10*3/uL (ref 150.0–400.0)
RBC: 3.52 Mil/uL — ABNORMAL LOW (ref 3.87–5.11)
RDW: 15.3 % (ref 11.5–15.5)
WBC: 3.6 10*3/uL — ABNORMAL LOW (ref 4.0–10.5)

## 2022-02-03 LAB — HEMOGLOBIN A1C: Hgb A1c MFr Bld: 6.5 % (ref 4.6–6.5)

## 2022-02-03 LAB — LIPID PANEL
Cholesterol: 90 mg/dL (ref 0–200)
HDL: 31.9 mg/dL — ABNORMAL LOW (ref >39.00)
LDL Cholesterol: 35 mg/dL (ref 0–99)
NonHDL: 57.67
Total CHOL/HDL Ratio: 3
Triglycerides: 113 mg/dL (ref 0.0–149.0)
VLDL: 22.6 mg/dL (ref 0.0–40.0)

## 2022-02-03 LAB — IBC + FERRITIN
Ferritin: 36.3 ng/mL (ref 10.0–291.0)
Iron: 24 ug/dL — ABNORMAL LOW (ref 42–145)
Saturation Ratios: 8.4 % — ABNORMAL LOW (ref 20.0–50.0)
TIBC: 287 ug/dL (ref 250.0–450.0)
Transferrin: 205 mg/dL — ABNORMAL LOW (ref 212.0–360.0)

## 2022-02-03 LAB — HEPATIC FUNCTION PANEL
ALT: 21 U/L (ref 0–35)
AST: 29 U/L (ref 0–37)
Albumin: 3.9 g/dL (ref 3.5–5.2)
Alkaline Phosphatase: 67 U/L (ref 39–117)
Bilirubin, Direct: 0.1 mg/dL (ref 0.0–0.3)
Total Bilirubin: 0.4 mg/dL (ref 0.2–1.2)
Total Protein: 6.2 g/dL (ref 6.0–8.3)

## 2022-02-03 LAB — BASIC METABOLIC PANEL
BUN: 24 mg/dL — ABNORMAL HIGH (ref 6–23)
CO2: 29 mEq/L (ref 19–32)
Calcium: 9.7 mg/dL (ref 8.4–10.5)
Chloride: 102 mEq/L (ref 96–112)
Creatinine, Ser: 1.56 mg/dL — ABNORMAL HIGH (ref 0.40–1.20)
GFR: 29.66 mL/min — ABNORMAL LOW (ref >60.00)
Glucose, Bld: 107 mg/dL — ABNORMAL HIGH (ref 70–99)
Potassium: 4.4 mEq/L (ref 3.5–5.1)
Sodium: 140 mEq/L (ref 135–145)

## 2022-02-03 LAB — TSH: TSH: 0.59 u[IU]/mL (ref 0.35–5.50)

## 2022-02-03 NOTE — Progress Notes (Signed)
Patient ID: Anita Ferrell, female   DOB: 1932-06-26, 88 y.o.   MRN: [id number]   Subjective:    Patient ID: Anita Ferrell, female    DOB: May 23, 1932, 88 y.o.   MRN: [id number]   Patient here for  Chief Complaint  Patient presents with   Follow-up    3 month f/u  Pt stated she fell on memorial day and had to get surgery on her arm (Right arm) and they put a plate in   .   HPI Here to follow up regarding hypercholesterolemia, hypertension and COPD.  Just saw Dr Patsey Berthold.  Recommended continuing trelegy.  Continue oxygen at night.  Prolia 11/23/21 - endocrinology.  Is s/p ORIF - right wrist 09/24/21. Being followed by ortho.    Is followed by Dr Candiss Norse for CKD. Evaluated 06/18/21 - felt stable.  Calculus of the left kidney noted on renal ultrasound in July 2022. Patient is asymptomatic. Per note, wanted to hold on another ultrasound or x-ray.  Recommended f/u in 6 months.  Reports she is doing relatively well.  Moved.  No chest pain.  Feels overall breathing is stable.  No abdominal pain or bowel change reported.    Past Medical History:  Diagnosis Date   A-fib ([place])    Allergy    Asthma    CHF (congestive heart failure) (HCC)    COPD (chronic obstructive pulmonary disease) (HCC)    GERD (gastroesophageal reflux disease)    Hypercholesteremia    Hypertension    Mitral valve insufficiency    Pulmonary hypertension (HCC)    Right Ventricular systolic pressure at 60 mm Hg by echo on july of 2011; Right heart cardic catherization revealed pulmonary artery pressusre of 27/10 with a mean of; Ventricular pressure 29/10 with pulmonary capillary wedge at 9   TB (pulmonary tuberculosis)    treated with INH therapy    Past Surgical History:  Procedure Laterality Date   CARDIAC CATHETERIZATION  10/2016   [facility]   CATARACT EXTRACTION Bilateral 2008   CHOLECYSTECTOMY     ORIF WRIST FRACTURE Right 09/24/2021   Procedure: OPEN REDUCTION INTERNAL FIXATION OF RIGHT DISTAL RADIUS FRACTURE;  Surgeon:  Hessie Knows, MD;  Location: [facility] ORS;  Service: Orthopedics;  Laterality: Right;   TONSILLECTOMY  1964   Family History  Problem Relation Age of Onset   Heart disease Mother    Heart disease Father    Hypertension Sister    Heart disease Brother    Breast cancer Maternal Aunt    Social History   Socioeconomic History   Marital status: Widowed    Spouse name: Not on file   Number of children: 3   Years of education: Not on file   Highest education level: Not on file  Occupational History   Not on file  Tobacco Use   Smoking status: Former    Packs/day: 1.00    Years: 40.00    Total pack years: 40.00    Types: Cigarettes    Quit date: 08/17/1990    Years since quitting: 31.5   Smokeless tobacco: Never   Tobacco comments:    Quit in 1992  Vaping Use   Vaping Use: Never used  Substance and Sexual Activity   Alcohol use: No    Alcohol/week: 0.0 standard drinks of alcohol   Drug use: No   Sexual activity: Not Currently  Other Topics Concern   Not on file  Social History Narrative   Not on file   Social  Determinants of Health   Financial Resource Strain: Low Risk  (02/14/2018)   Overall Financial Resource Strain (CARDIA)    Difficulty of Paying Living Expenses: Not hard at all  Food Insecurity: No Food Insecurity (02/14/2018)   Hunger Vital Sign    Worried About Running Out of Food in the Last Year: Never true    Ran Out of Food in the Last Year: Never true  Transportation Needs: No Transportation Needs (02/14/2018)   PRAPARE - Hydrologist (Medical): No    Lack of Transportation (Non-Medical): No  Physical Activity: Unknown (02/14/2018)   Exercise Vital Sign    Days of Exercise per Week: 0 days    Minutes of Exercise per Session: Not on file  Stress: No Stress Concern Present (02/14/2018)   [place]    Feeling of Stress : Not at all  Social Connections: Not on  file     Review of Systems  Constitutional:  Negative for appetite change and unexpected weight change.  HENT:  Negative for congestion and sinus pressure.   Respiratory:  Negative for chest tightness.        Breathing overall stable.   Cardiovascular:  Negative for chest pain, palpitations and leg swelling.  Gastrointestinal:  Negative for abdominal pain, diarrhea, nausea and vomiting.  Genitourinary:  Negative for difficulty urinating and dysuria.  Musculoskeletal:  Negative for joint swelling and myalgias.  Skin:  Negative for color change and rash.  Neurological:  Negative for dizziness and headaches.  Psychiatric/Behavioral:  Negative for agitation and dysphoric mood.        Objective:     BP (!) 118/58 (BP Location: Left Arm, Patient Position: Sitting, Cuff Size: Normal)   Pulse 91   Temp (!) 97.5 F (36.4 C) (Oral)   Ht $R'5\' 3"'tg$  (1.6 m)   Wt 131 lb 3.2 oz (59.5 kg)   SpO2 95%   BMI 23.24 kg/m  Wt Readings from Last 3 Encounters:  02/03/22 131 lb 3.2 oz (59.5 kg)  02/01/22 132 lb 3.2 oz (60 kg)  09/14/21 138 lb 14.2 oz (63 kg)    Physical Exam Vitals reviewed.  Constitutional:      General: She is not in acute distress.    Appearance: Normal appearance.  HENT:     Head: Normocephalic and atraumatic.     Right Ear: External ear normal.     Left Ear: External ear normal.  Eyes:     General: No scleral icterus.       Right eye: No discharge.        Left eye: No discharge.     Conjunctiva/sclera: Conjunctivae normal.  Neck:     Thyroid: No thyromegaly.  Cardiovascular:     Rate and Rhythm: Normal rate and regular rhythm.  Pulmonary:     Effort: No respiratory distress.     Breath sounds: No wheezing.  Abdominal:     General: Bowel sounds are normal.     Palpations: Abdomen is soft.     Tenderness: There is no abdominal tenderness.  Musculoskeletal:        General: No swelling or tenderness.     Cervical back: Neck supple. No tenderness.   Lymphadenopathy:     Cervical: No cervical adenopathy.  Skin:    Findings: No erythema or rash.  Neurological:     Mental Status: She is alert.  Psychiatric:        Mood  and Affect: Mood normal.        Behavior: Behavior normal.      Outpatient Encounter Medications as of 02/03/2022  Medication Sig   acetaminophen (TYLENOL) 500 MG tablet Take 500 mg by mouth as needed.    albuterol (PROVENTIL) (2.5 MG/3ML) 0.083% nebulizer solution INHALE CONTENTS 1 VIAL VIA NEBULIZER EVERY 6 HOURS AS NEEDED WHEEZE/SHORTNESS OF BREATH J44.9   albuterol (VENTOLIN HFA) 108 (90 Base) MCG/ACT inhaler Inhale 1-2 puffs into the lungs every 6 (six) hours as needed for wheezing or shortness of breath.   aspirin EC 81 MG tablet Take 81 mg by mouth daily.   azelastine (ASTELIN) 0.1 % nasal spray Use in each nostril as directed   Calcium Carbonate-Vitamin D 600-400 MG-UNIT tablet Take 1 tablet by mouth at bedtime.   famotidine (PEPCID) 20 MG tablet Take 20 mg by mouth daily as needed for heartburn or indigestion.   ferrous sulfate 325 (65 FE) MG tablet Take 325 mg by mouth 2 (two) times daily with a meal.    fexofenadine (ALLEGRA) 180 MG tablet Take 180 mg by mouth daily.   furosemide (LASIX) 40 MG tablet TAKE 0.5 TABLETS (20 MG TOTAL) BY MOUTH EVERY OTHER DAY.   lansoprazole (PREVACID) 30 MG capsule Take 30 mg by mouth daily.   meclizine (ANTIVERT) 25 MG tablet Take 25 mg by mouth as needed for dizziness.   metoprolol tartrate (LOPRESSOR) 50 MG tablet TAKE 1/2 TABLET BY MOUTH TWICE A DAY   Multiple Vitamins-Minerals (CENTRUM SILVER PO) Take by mouth.   OXYGEN Inhale 2 L into the lungs at bedtime.   Probiotic Product (ALIGN PO) Take by mouth.   rosuvastatin (CRESTOR) 10 MG tablet TAKE 1 TABLET (10 MG TOTAL) BY MOUTH DAILY. NEEDS APPT FOR FURTHER REFILL. SEE MYCHART MESSAGE   spironolactone (ALDACTONE) 25 MG tablet TAKE 1/2 TABLET BY MOUTH EVERY DAY   TRELEGY ELLIPTA 100-62.5-25 MCG/ACT AEPB INHALE 1 PUFF BY  MOUTH EVERY DAY   [DISCONTINUED] Cranberry 500 MG TABS Take 500 mg by mouth daily at 2 PM. (Patient not taking: Reported on 02/03/2022)   [DISCONTINUED] Cranberry, Vacc oxycoccus, (HM CRANBERRY) 500 MG TABS Take by mouth. Take 500 mg by mouth daily with breakfast (Patient not taking: Reported on 02/01/2022)   [DISCONTINUED] HYDROcodone-acetaminophen (NORCO) 5-325 MG tablet Take 1 tablet by mouth every 6 (six) hours as needed for moderate pain. (Patient not taking: Reported on 02/01/2022)   [DISCONTINUED] methylPREDNISolone (MEDROL DOSEPAK) 4 MG TBPK tablet Medrol dosepack 6 day taper.  Take as directed. (Patient not taking: Reported on 02/03/2022)   [DISCONTINUED] traMADol (ULTRAM) 50 MG tablet Take 1 tablet (50 mg total) by mouth every 6 (six) hours as needed for severe pain. (Patient not taking: Reported on 02/01/2022)   No facility-administered encounter medications on file as of 02/03/2022.     Lab Results  Component Value Date   WBC 3.6 (L) 02/03/2022   HGB 9.0 (L) 02/03/2022   HCT 28.1 (L) 02/03/2022   PLT 193.0 02/03/2022   GLUCOSE 107 (H) 02/03/2022   CHOL 90 02/03/2022   TRIG 113.0 02/03/2022   HDL 31.90 (L) 02/03/2022   LDLDIRECT 127.1 08/25/2012   LDLCALC 35 02/03/2022   ALT 21 02/03/2022   AST 29 02/03/2022   NA 140 02/03/2022   K 4.4 02/03/2022   CL 102 02/03/2022   CREATININE 1.56 (H) 02/03/2022   BUN 24 (H) 02/03/2022   CO2 29 02/03/2022   TSH 0.59 02/03/2022   HGBA1C 6.5 02/03/2022  Assessment & Plan:   Problem List Items Addressed This Visit     Anemia    Has been evaluated by hematology previously.  Had recommended following as long as hgb stable.  Recheck cbc and iron studies today.  Discussed with her.  States she is ok with rechecking, but desires no further w/up or evaluation at this time.       Relevant Orders   CBC with Differential/Platelet (Completed)   IBC + Ferritin (Completed)   CKD (chronic kidney disease) stage 3, GFR 30-59 ml/min  (HCC)     Is followed by Dr Candiss Norse for CKD. Evaluated 06/18/21 - felt stable. Avoid antiinflammatories.        Relevant Orders   Basic metabolic panel (Completed)   COPD with hypoxia ([place])    She feels her breathing is stable.  Continue trelegy.  Continue nocturnal oxygen.  Follow.       Essential hypertension - Primary    Continue aldactone and metoprolol.  Blood pressure doing well.  Follow pressures.  Follow metabolic panel.  Blood pressure rechecked by me 128/68.       Relevant Orders   Basic metabolic panel (Completed)   GERD (gastroesophageal reflux disease)    Continue prevacid.  Controlled.       Hypercholesterolemia    Continue crestor.  Low cholesterol diet and exercise.  Follow lipid panel and liver function tests.        Relevant Orders   Lipid panel (Completed)   Hepatic function panel (Completed)   TSH (Completed)   Hyperglycemia    Low carb diet and exercise.  Follow met b and a1c.       Relevant Orders   Hemoglobin A1c (Completed)   Nephrolithiasis     Calculus of the left kidney noted on renal ultrasound in July 2022. Patient is asymptomatic. Per note, wanted to hold on another ultrasound or x-ray.  Recommended f/u in 9 months.      Nocturnal hypoxia    Continues with oxygen at night.  Follow.       Osteoporosis    prolia 11/23/21.       Pulmonary HTN ([place])    Continue trelegy.  Continue oxygen.  Followed by pulmonary and cardiology.          [name], MD

## 2022-02-08 DIAGNOSIS — Z23 Encounter for immunization: Secondary | ICD-10-CM | POA: Diagnosis not present

## 2022-02-08 NOTE — Assessment & Plan Note (Signed)
Continue crestor.  Low cholesterol diet and exercise. Follow lipid panel and liver function tests.   

## 2022-02-08 NOTE — Assessment & Plan Note (Signed)
Continues with oxygen at night.  Follow.

## 2022-02-08 NOTE — Assessment & Plan Note (Signed)
Continue prevacid.  Controlled.  

## 2022-02-08 NOTE — Assessment & Plan Note (Addendum)
Has been evaluated by hematology previously.  Had recommended following as long as hgb stable.  Recheck cbc and iron studies today.  Discussed with her.  States she is ok with rechecking, but desires no further w/up or evaluation at this time.  

## 2022-02-08 NOTE — Assessment & Plan Note (Signed)
Calculus of the left kidney noted on renal ultrasound in July 2022. Patient is asymptomatic. Per note, wanted to hold on another ultrasound or x-ray.  Recommended f/u in 9 months.

## 2022-02-08 NOTE — Assessment & Plan Note (Signed)
She feels her breathing is stable.  Continue trelegy.  Continue nocturnal oxygen.  Follow.  

## 2022-02-08 NOTE — Assessment & Plan Note (Signed)
Continue trelegy.  Continue oxygen.  Followed by pulmonary and cardiology.   

## 2022-02-08 NOTE — Assessment & Plan Note (Signed)
prolia 11/23/21.

## 2022-02-08 NOTE — Assessment & Plan Note (Signed)
Is followed by Dr Candiss Norse for CKD. Evaluated 06/18/21 - felt stable. Avoid antiinflammatories.

## 2022-02-08 NOTE — Assessment & Plan Note (Addendum)
Continue aldactone and metoprolol.  Blood pressure doing well.  Follow pressures.  Follow metabolic panel.  Blood pressure rechecked by me 128/68.

## 2022-02-08 NOTE — Assessment & Plan Note (Signed)
Low carb diet and exercise.  Follow met b and a1c.  

## 2022-02-18 NOTE — Progress Notes (Signed)
Date:  02/19/2022   ID:  [name], DOB 1932-12-25, MRN [id number]  Patient Location:  [place] [id number]   Provider location:   [facility], [place] office  PCP:  [name], MD  Cardiologist:  [facility]   Chief Complaint  Patient presents with   12 month follow up    "Doing well." Medications reviewed by the patient verbally.     History of Present Illness:    [name] is a 88 y.o. female past medical history of Pulmonary hypertension HTN,  COPD, smoker 88 yo quit 56 Moderate Mitral Regurg,(resolved on repeat echo) mild to moderate tricuspid valve regurgitation  chronic shortness of breath with exertion.  2015  PFT reports of an FEV1 of 1.1 L.   Echo in 08/2015: EF 35% Nonobstructive coronary disease by catheterization July 2018 Moderately elevated right heart pressures with normal PCWP of 8, by right heart catheterization , on Lasix every other day Mildly reduced LV function, LVEF approximately 45% by TEE at [facility] Ejection fraction 55 to 60% in March 2021, right heart pressures estimated to be normal Who presents for mitral valve regurgitation, shortness of breath, pulm HTN  LOV July 2022  In follow-up today reports she is doing well Recent fall, wrist fracture, required surgery  Takes lasix 1/2 pill daily (20 mg daily) Breathing is "pretty good" Followed by Dr. Candiss Norse, nephrology No lower extremity edema, denies abdominal distention from fluid retention Feels her breathing is stable Uses oxygen when sleeping 2 L  Active, recently moved into the house in a long, has been on the move  Lab work reviewed A1C 6.5 Total chol 90, LDL 35  EKG personally reviewed by myself on todays visit Normal sinus rhythm rate 74 bpm right bundle branch block  Other past medical history reviewed  emergency room February 2020 with shortness of breath Chronic shortness of breath from COPD pulmonary hypertension,  takes Lasix every other day  Echocardiogram March 2021 Ejection fraction 50% in March 2021, right heart pressures estimated to be normal No significant mitral valve regurgitation  Lab work reviewed total cholesterol 112 LDL 49 Hemoglobin A1c 6.1  Takes extra lasix for weight gain, rare  Goal weight <145 Today is 142  RHC, at [facility] in 2018,  1.  Cardiac index 2.2 L/m/m2 at rest with PCWP 8 mmHg. 2.  Moderate pulmonary hypertension in the absence of elevated PCWP, PVR 6.0 Wu. 3.  Non obstructive CAD medical therapy of moderate MR and severe COPD   Prior CV studies:    Right Ventricular systolic pressure at 60 mm Hg by echo on july of 2011; Right heart cardic catherization revealed pulmonary artery pressusre of 27/10 with a mean of; Ventricular pressure 29/10 with pulmonary capillary wedge at 9  Records from [facility] reviewed with her in detail on today's visit Cath 10/2016 1.  Cardiac index 2.2 L/m/m2 at rest with PCWP 8 mmHg. 2.  Moderate pulmonary hypertension in the absence of elevated PCWP, PVR 6.0 Wu. 3.  Non obstructive CAD Plan medical therapy of moderate MR and severe COPD    Previously followed by Dr Aline Brochure at [facility].   ECHO - mild to moderate left ventricular dysfunction with moderate to severe MR and moderate TR.     TEE:  1. Moderate Functional MR (EROA by PISA = 0.1)   2. Moderate TR   3. Mild PR   4. No interatrial septal communication  5. Mildly reduced LV function, LVEF approximately 45%   Lab work reviewed with creatinine 1.4    Past Medical History:  Diagnosis Date   A-fib ([place])    Allergy    Asthma    CHF (congestive heart failure) (HCC)    COPD (chronic obstructive pulmonary disease) (HCC)    GERD (gastroesophageal reflux disease)    Hypercholesteremia    Hypertension    Mitral valve insufficiency    Pulmonary hypertension (HCC)    Right Ventricular systolic pressure at 60 mm Hg by echo on july of 2011; Right heart cardic catherization revealed  pulmonary artery pressusre of 27/10 with a mean of; Ventricular pressure 29/10 with pulmonary capillary wedge at 9   TB (pulmonary tuberculosis)    treated with INH therapy    Past Surgical History:  Procedure Laterality Date   CARDIAC CATHETERIZATION  10/2016   [facility]   CATARACT EXTRACTION Bilateral 2008   CHOLECYSTECTOMY     ORIF WRIST FRACTURE Right 09/24/2021   Procedure: OPEN REDUCTION INTERNAL FIXATION OF RIGHT DISTAL RADIUS FRACTURE;  Surgeon: Hessie Knows, MD;  Location: [facility] ORS;  Service: Orthopedics;  Laterality: Right;   TONSILLECTOMY  1964     Current Meds  Medication Sig   acetaminophen (TYLENOL) 500 MG tablet Take 500 mg by mouth as needed.    albuterol (PROVENTIL) (2.5 MG/3ML) 0.083% nebulizer solution INHALE CONTENTS 1 VIAL VIA NEBULIZER EVERY 6 HOURS AS NEEDED WHEEZE/SHORTNESS OF BREATH J44.9   albuterol (VENTOLIN HFA) 108 (90 Base) MCG/ACT inhaler Inhale 1-2 puffs into the lungs every 6 (six) hours as needed for wheezing or shortness of breath.   aspirin EC 81 MG tablet Take 81 mg by mouth daily.   azelastine (ASTELIN) 0.1 % nasal spray Use in each nostril as directed   Calcium Carbonate-Vitamin D 600-400 MG-UNIT tablet Take 1 tablet by mouth at bedtime.   famotidine (PEPCID) 20 MG tablet Take 20 mg by mouth daily as needed for heartburn or indigestion.   ferrous sulfate 325 (65 FE) MG tablet Take 325 mg by mouth 2 (two) times daily with a meal.    fexofenadine (ALLEGRA) 180 MG tablet Take 180 mg by mouth daily.   furosemide (LASIX) 40 MG tablet Take 20 mg by mouth daily.   lansoprazole (PREVACID) 30 MG capsule Take 30 mg by mouth daily.   meclizine (ANTIVERT) 25 MG tablet Take 25 mg by mouth as needed for dizziness.   metoprolol tartrate (LOPRESSOR) 50 MG tablet TAKE 1/2 TABLET BY MOUTH TWICE A DAY   Multiple Vitamins-Minerals (CENTRUM SILVER PO) Take by mouth.   OXYGEN Inhale 2 L into the lungs at bedtime.   Probiotic Product (ALIGN PO) Take by mouth.   rosuvastatin  (CRESTOR) 10 MG tablet TAKE 1 TABLET (10 MG TOTAL) BY MOUTH DAILY. NEEDS APPT FOR FURTHER REFILL. SEE MYCHART MESSAGE   spironolactone (ALDACTONE) 25 MG tablet TAKE 1/2 TABLET BY MOUTH EVERY DAY   TRELEGY ELLIPTA 100-62.5-25 MCG/ACT AEPB INHALE 1 PUFF BY MOUTH EVERY DAY     Allergies:   Penicillin g and Penicillins   Social History   Tobacco Use   Smoking status: Former    Packs/day: 1.00    Years: 40.00    Total pack years: 40.00    Types: Cigarettes    Quit date: 08/17/1990    Years since quitting: 31.5   Smokeless tobacco: Never   Tobacco comments:    Quit in 1992  Vaping Use   Vaping Use: Never used  Substance Use Topics   Alcohol use: No    Alcohol/week: 0.0 standard drinks of alcohol   Drug use: No     Family Hx: The patient's family history includes Breast cancer in her maternal aunt; Heart disease in her brother, father, and mother; Hypertension in her sister.  ROS:   Please see the history of present illness.    Review of Systems  Constitutional: Negative.   HENT: Negative.    Respiratory: Negative.    Cardiovascular: Negative.   Gastrointestinal: Negative.   Musculoskeletal: Negative.   Neurological: Negative.   Psychiatric/Behavioral: Negative.    All other systems reviewed and are negative.    Labs/Other Tests and Data Reviewed:    Recent Labs: 02/03/2022: ALT 21; BUN 24; Creatinine, Ser 1.56; Hemoglobin 9.0; Platelets 193.0; Potassium 4.4; Sodium 140; TSH 0.59   Recent Lipid Panel Lab Results  Component Value Date/Time   CHOL 90 02/03/2022 11:03 AM   TRIG 113.0 02/03/2022 11:03 AM   HDL 31.90 (L) 02/03/2022 11:03 AM   CHOLHDL 3 02/03/2022 11:03 AM   LDLCALC 35 02/03/2022 11:03 AM   LDLDIRECT 127.1 08/25/2012 11:03 AM    Wt Readings from Last 3 Encounters:  02/19/22 131 lb (59.4 kg)  02/03/22 131 lb 3.2 oz (59.5 kg)  02/01/22 132 lb 3.2 oz (60 kg)     Exam:    Vital Signs: Vital signs may also be detailed in the HPI BP 120/60 (BP  Location: Left Arm, Patient Position: Sitting, Cuff Size: Normal)   Pulse 74   Ht '5\' 4"'$  (1.626 m)   Wt 131 lb (59.4 kg)   SpO2 97%   BMI 22.49 kg/m   Constitutional:  oriented to person, place, and time. No distress.  HENT:  Head: Grossly normal Eyes:  no discharge. No scleral icterus.  Neck: No JVD, no carotid bruits  Cardiovascular: Regular rate and rhythm, no murmurs appreciated Pulmonary/Chest: Clear to auscultation bilaterally, no wheezes or rails Abdominal: Soft.  no distension.  no tenderness.  Musculoskeletal: Normal range of motion Neurological:  normal muscle tone. Coordination normal. No atrophy Skin: Skin warm and dry Psychiatric: normal affect, pleasant   ASSESSMENT & PLAN:    Pulmonary HTN (HCC) Severe underlying lung disease Oxygen at nighttime Weight stable, continue Lasix 20 daily For any worsening renal function may need to decrease Lasix dosing once or twice a week.  Slight trend up in BUN, stable creatinine  COPD with hypoxia (HCC) Uses nebulizer, inhalers, stable  no recent COPD exacerbation Managed by pulmonary Reports she is at her baseline  Moderate mitral regurgitation Moderate MR previously seen on echocardiogram, appeared to resolve on echo March 2021 No change in clinical status, no further testing ordered at this time  CKD (chronic kidney disease) stage 3, GFR 30-59 ml/min (HCC) No changes made to her medications Stable renal function creatinine 1.5, stable  Essential hypertension Blood pressure is well controlled on today's visit. No changes made to the medications.  Hypercholesterolemia Cholesterol is at goal on the current lipid regimen. No changes to the medications were made.    Total encounter time more than 30 minutes  Greater than 50% was spent in counseling and coordination of care with the patient   Signed, Ida Rogue, MD  02/19/2022 11:05 AM    [place] [id number], [place], [place] [address]

## 2022-02-19 ENCOUNTER — Ambulatory Visit: Payer: Medicare Other | Attending: Cardiovascular Disease | Admitting: Cardiovascular Disease

## 2022-02-19 ENCOUNTER — Encounter: Payer: Self-pay | Admitting: Cardiovascular Disease

## 2022-02-19 VITALS — BP 120/60 | HR 74 | Ht 64.0 in | Wt 131.0 lb

## 2022-02-19 DIAGNOSIS — I340 Nonrheumatic mitral (valve) insufficiency: Secondary | ICD-10-CM | POA: Diagnosis not present

## 2022-02-19 DIAGNOSIS — I10 Essential (primary) hypertension: Secondary | ICD-10-CM | POA: Diagnosis not present

## 2022-02-19 DIAGNOSIS — E7800 Pure hypercholesterolemia, unspecified: Secondary | ICD-10-CM | POA: Insufficient documentation

## 2022-02-19 DIAGNOSIS — D649 Anemia, unspecified: Secondary | ICD-10-CM | POA: Diagnosis not present

## 2022-02-19 DIAGNOSIS — I498 Other specified cardiac arrhythmias: Secondary | ICD-10-CM | POA: Diagnosis not present

## 2022-02-19 DIAGNOSIS — K2100 Gastro-esophageal reflux disease with esophagitis, without bleeding: Secondary | ICD-10-CM | POA: Insufficient documentation

## 2022-02-19 DIAGNOSIS — I272 Pulmonary hypertension, unspecified: Secondary | ICD-10-CM | POA: Diagnosis not present

## 2022-02-19 MED ORDER — METOPROLOL TARTRATE 25 MG PO TABS: 25.0000 mg | ORAL_TABLET | Freq: Two times a day (BID) | ORAL | 3 refills | Status: DC

## 2022-02-19 MED ORDER — SPIRONOLACTONE 25 MG PO TABS: 12.5000 mg | ORAL_TABLET | Freq: Every day | ORAL | 3 refills | Status: DC

## 2022-02-19 MED ORDER — FUROSEMIDE 20 MG PO TABS: 20.0000 mg | ORAL_TABLET | Freq: Every day | ORAL | 3 refills | Status: DC

## 2022-02-19 NOTE — Patient Instructions (Addendum)
Medication Instructions:  - Your physician has recommended you make the following change in your medication:   1) Change lasix to 20 mg daily (not the 40 mg )  2) Change metoprolol  to 25 mg twice a day (not 50 mg)  If you need a refill on your cardiac medications before your next appointment, please call your pharmacy.    Lab work: No new labs needed   Testing/Procedures: No new testing needed   Follow-Up: At [facility], you and your health needs are our priority.  As part of our continuing mission to provide you with exceptional heart care, we have created designated Provider Care Teams.  These Care Teams include your primary Cardiologist (physician) and Advanced Practice Providers (APPs -  Physician Assistants and Nurse Practitioners) who all work together to provide you with the care you need, when you need it.  You will need a follow up appointment in 12 months  Providers on your designated Care Team:   Murray Hodgkins, NP Christell Faith, PA-C Cadence Kathlen Mody, [place]  COVID-19 Vaccine Information can be found at: [email or web address].uk For questions related to vaccine distribution or appointments, please email [email or web address] or call 6395479601.

## 2022-04-08 DIAGNOSIS — M810 Age-related osteoporosis without current pathological fracture: Secondary | ICD-10-CM | POA: Diagnosis not present

## 2022-04-15 DIAGNOSIS — M810 Age-related osteoporosis without current pathological fracture: Secondary | ICD-10-CM | POA: Diagnosis not present

## 2022-05-02 ENCOUNTER — Other Ambulatory Visit: Payer: Self-pay | Admitting: Pulmonary Disease

## 2022-06-01 DIAGNOSIS — M810 Age-related osteoporosis without current pathological fracture: Secondary | ICD-10-CM | POA: Diagnosis not present

## 2022-06-09 ENCOUNTER — Encounter: Payer: Self-pay | Admitting: Internal Medicine

## 2022-06-09 ENCOUNTER — Ambulatory Visit (INDEPENDENT_AMBULATORY_CARE_PROVIDER_SITE_OTHER): Payer: Medicare Other | Admitting: Internal Medicine

## 2022-06-09 VITALS — BP 128/72 | HR 90 | Temp 98.5°F | Resp 16 | Ht 63.0 in | Wt 129.8 lb

## 2022-06-09 DIAGNOSIS — R739 Hyperglycemia, unspecified: Secondary | ICD-10-CM | POA: Diagnosis not present

## 2022-06-09 DIAGNOSIS — K2100 Gastro-esophageal reflux disease with esophagitis, without bleeding: Secondary | ICD-10-CM | POA: Diagnosis not present

## 2022-06-09 DIAGNOSIS — E7800 Pure hypercholesterolemia, unspecified: Secondary | ICD-10-CM | POA: Diagnosis not present

## 2022-06-09 DIAGNOSIS — N183 Chronic kidney disease, stage 3 unspecified: Secondary | ICD-10-CM

## 2022-06-09 DIAGNOSIS — I10 Essential (primary) hypertension: Secondary | ICD-10-CM

## 2022-06-09 DIAGNOSIS — K2270 Barrett's esophagus without dysplasia: Secondary | ICD-10-CM

## 2022-06-09 DIAGNOSIS — I272 Pulmonary hypertension, unspecified: Secondary | ICD-10-CM | POA: Diagnosis not present

## 2022-06-09 DIAGNOSIS — D649 Anemia, unspecified: Secondary | ICD-10-CM

## 2022-06-09 DIAGNOSIS — Z0000 Encounter for general adult medical examination without abnormal findings: Secondary | ICD-10-CM

## 2022-06-09 DIAGNOSIS — J449 Chronic obstructive pulmonary disease, unspecified: Secondary | ICD-10-CM

## 2022-06-09 LAB — CBC WITH DIFFERENTIAL/PLATELET
Basophils Absolute: 0 10*3/uL (ref 0.0–0.1)
Basophils Relative: 0.4 % (ref 0.0–3.0)
Eosinophils Absolute: 0.1 10*3/uL (ref 0.0–0.7)
Eosinophils Relative: 2.8 % (ref 0.0–5.0)
HCT: 29.1 % — ABNORMAL LOW (ref 36.0–46.0)
Hemoglobin: 9.7 g/dL — ABNORMAL LOW (ref 12.0–15.0)
Lymphocytes Relative: 21.4 % (ref 12.0–46.0)
Lymphs Abs: 0.6 10*3/uL — ABNORMAL LOW (ref 0.7–4.0)
MCHC: 33.3 g/dL (ref 30.0–36.0)
MCV: 81.8 fl (ref 78.0–100.0)
Monocytes Absolute: 0.2 10*3/uL (ref 0.1–1.0)
Monocytes Relative: 5.8 % (ref 3.0–12.0)
Neutro Abs: 1.8 10*3/uL (ref 1.4–7.7)
Neutrophils Relative %: 69.6 % (ref 43.0–77.0)
Platelets: 172 10*3/uL (ref 150.0–400.0)
RBC: 3.56 Mil/uL — ABNORMAL LOW (ref 3.87–5.11)
RDW: 15.7 % — ABNORMAL HIGH (ref 11.5–15.5)
WBC: 2.6 10*3/uL — ABNORMAL LOW (ref 4.0–10.5)

## 2022-06-09 LAB — HEMOGLOBIN A1C: Hgb A1c MFr Bld: 6.2 % (ref 4.6–6.5)

## 2022-06-09 MED ORDER — ROSUVASTATIN CALCIUM 10 MG PO TABS: 10.0000 mg | ORAL_TABLET | Freq: Every day | ORAL | 3 refills | Status: DC

## 2022-06-09 NOTE — Assessment & Plan Note (Addendum)
Physical today 06/09/22.  Mammogram 03/06/21 - Birads I.  Request mammogram every other year.  Colonoscopy 04/2014.  Prolia 06/01/22.

## 2022-06-09 NOTE — Progress Notes (Signed)
Subjective:    Patient ID: Anita Ferrell, female    DOB: 29-Mar-1933, 89 y.o.   MRN: [id number]  Patient here for  Chief Complaint  Patient presents with   Annual Exam    HPI With past history of hypercholesterolemia, hypertension and COPD - comes in today to follow up on these issues as well as for a complete physical exam. Sees Dr Patsey Berthold. Recommended continuing trelegy. Continue oxygen at night. Saw Dr Rockey Situ 02/19/22 - f/u pulmonary HTN.  Recommended to continue lasix daily.  Can adjust - following renal function. Seeing Dr Honor Junes - f/u osteoprosis.  Recent bone density improved.  Prolia 06/01/22. She reports she is doing relatively well.  Breathing stable.  Adjusting to the move.  No increased cough or congestion.  No abdominal pain or bowel change.  Discussed mammogram.  Wants to do every other year.     Past Medical History:  Diagnosis Date   A-fib ([place])    Allergy    Asthma    CHF (congestive heart failure) (HCC)    COPD (chronic obstructive pulmonary disease) (HCC)    GERD (gastroesophageal reflux disease)    Hypercholesteremia    Hypertension    Mitral valve insufficiency    Pulmonary hypertension (HCC)    Right Ventricular systolic pressure at 60 mm Hg by echo on july of 2011; Right heart cardic catherization revealed pulmonary artery pressusre of 27/10 with a mean of; Ventricular pressure 29/10 with pulmonary capillary wedge at 9   TB (pulmonary tuberculosis)    treated with INH therapy    Past Surgical History:  Procedure Laterality Date   CARDIAC CATHETERIZATION  10/2016   [facility]   CATARACT EXTRACTION Bilateral 2008   CHOLECYSTECTOMY     ORIF WRIST FRACTURE Right 09/24/2021   Procedure: OPEN REDUCTION INTERNAL FIXATION OF RIGHT DISTAL RADIUS FRACTURE;  Surgeon: Hessie Knows, MD;  Location: [facility] ORS;  Service: Orthopedics;  Laterality: Right;   TONSILLECTOMY  1964   Family History  Problem Relation Age of Onset   Heart disease Mother    Heart disease  Father    Hypertension Sister    Heart disease Brother    Breast cancer Maternal Aunt    Social History   Socioeconomic History   Marital status: Widowed    Spouse name: Not on file   Number of children: 3   Years of education: Not on file   Highest education level: Not on file  Occupational History   Not on file  Tobacco Use   Smoking status: Former    Packs/day: 1.00    Years: 40.00    Total pack years: 40.00    Types: Cigarettes    Quit date: 08/17/1990    Years since quitting: 31.8   Smokeless tobacco: Never   Tobacco comments:    Quit in [place] Use   Vaping Use: Never used  Substance and Sexual Activity   Alcohol use: No    Alcohol/week: 0.0 standard drinks of alcohol   Drug use: No   Sexual activity: Not Currently  Other Topics Concern   Not on file  Social History Narrative   Not on file   Social Determinants of Health   Financial Resource Strain: Low Risk  (02/14/2018)   Overall Financial Resource Strain (CARDIA)    Difficulty of Paying Living Expenses: Not hard at all  Food Insecurity: No Food Insecurity (02/14/2018)   Hunger Vital Sign    Worried About Charity fundraiser in  the Last Year: Never true    [place] in the Last Year: Never true  Transportation Needs: No Transportation Needs (02/14/2018)   PRAPARE - Hydrologist (Medical): No    Lack of Transportation (Non-Medical): No  Physical Activity: Unknown (02/14/2018)   Exercise Vital Sign    Days of Exercise per Week: 0 days    Minutes of Exercise per Session: Not on file  Stress: No Stress Concern Present (02/14/2018)   [place]    Feeling of Stress : Not at all  Social Connections: Not on file     Review of Systems  Constitutional:  Negative for appetite change and unexpected weight change.  HENT:  Negative for congestion, sinus pressure and sore throat.   Eyes:  Negative for pain  and visual disturbance.  Respiratory:  Negative for cough and chest tightness.        Breathing stable.   Cardiovascular:  Negative for chest pain, palpitations and leg swelling.  Gastrointestinal:  Negative for abdominal pain, diarrhea, nausea and vomiting.  Genitourinary:  Negative for difficulty urinating and dysuria.  Musculoskeletal:  Negative for joint swelling and myalgias.  Skin:  Negative for color change and rash.  Neurological:  Negative for dizziness and headaches.  Hematological:  Negative for adenopathy. Does not bruise/bleed easily.  Psychiatric/Behavioral:  Negative for agitation and decreased concentration.        Objective:     BP 128/72   Pulse 90   Temp 98.5 F (36.9 C)   Resp 16   Ht '5\' 3"'$  (1.6 m)   Wt 129 lb 12.8 oz (58.9 kg)   SpO2 98%   BMI 22.99 kg/m  Wt Readings from Last 3 Encounters:  06/09/22 129 lb 12.8 oz (58.9 kg)  02/19/22 131 lb (59.4 kg)  02/03/22 131 lb 3.2 oz (59.5 kg)    Physical Exam Vitals reviewed.  Constitutional:      General: She is not in acute distress.    Appearance: Normal appearance. She is well-developed.  HENT:     Head: Normocephalic and atraumatic.     Right Ear: External ear normal.     Left Ear: External ear normal.  Eyes:     General: No scleral icterus.       Right eye: No discharge.        Left eye: No discharge.     Conjunctiva/sclera: Conjunctivae normal.  Neck:     Thyroid: No thyromegaly.  Cardiovascular:     Rate and Rhythm: Normal rate and regular rhythm.  Pulmonary:     Effort: No tachypnea, accessory muscle usage or respiratory distress.     Breath sounds: Normal breath sounds. No decreased breath sounds or wheezing.  Chest:  Breasts:    Right: No inverted nipple, mass, nipple discharge or tenderness (no axillary adenopathy).     Left: No inverted nipple, mass, nipple discharge or tenderness (no axilarry adenopathy).  Abdominal:     General: Bowel sounds are normal.     Palpations: Abdomen is  soft.     Tenderness: There is no abdominal tenderness.  Musculoskeletal:        General: No swelling or tenderness.     Cervical back: Neck supple.  Lymphadenopathy:     Cervical: No cervical adenopathy.  Skin:    Findings: No erythema or rash.  Neurological:     Mental Status: She is alert and oriented to person,  place, and time.  Psychiatric:        Mood and Affect: Mood normal.        Behavior: Behavior normal.      Outpatient Encounter Medications as of 06/09/2022  Medication Sig   acetaminophen (TYLENOL) 500 MG tablet Take 500 mg by mouth as needed.    albuterol (PROVENTIL) (2.5 MG/3ML) 0.083% nebulizer solution INHALE CONTENTS 1 VIAL VIA NEBULIZER EVERY 6 HOURS AS NEEDED WHEEZE/SHORTNESS OF BREATH J44.9   albuterol (VENTOLIN HFA) 108 (90 Base) MCG/ACT inhaler Inhale 1-2 puffs into the lungs every 6 (six) hours as needed for wheezing or shortness of breath.   aspirin EC 81 MG tablet Take 81 mg by mouth daily.   azelastine (ASTELIN) 0.1 % nasal spray Use in each nostril as directed   Calcium Carbonate-Vitamin D 600-400 MG-UNIT tablet Take 1 tablet by mouth at bedtime.   famotidine (PEPCID) 20 MG tablet Take 20 mg by mouth daily as needed for heartburn or indigestion.   ferrous sulfate 325 (65 FE) MG tablet Take 325 mg by mouth 2 (two) times daily with a meal.    fexofenadine (ALLEGRA) 180 MG tablet Take 180 mg by mouth daily.   furosemide (LASIX) 20 MG tablet Take 1 tablet (20 mg total) by mouth daily.   lansoprazole (PREVACID) 30 MG capsule Take 30 mg by mouth daily.   meclizine (ANTIVERT) 25 MG tablet Take 25 mg by mouth as needed for dizziness.   metoprolol tartrate (LOPRESSOR) 25 MG tablet Take 1 tablet (25 mg total) by mouth 2 (two) times daily.   Multiple Vitamins-Minerals (CENTRUM SILVER PO) Take by mouth.   OXYGEN Inhale 2 L into the lungs at bedtime.   Probiotic Product (ALIGN PO) Take by mouth.   spironolactone (ALDACTONE) 25 MG tablet Take 0.5 tablets (12.5 mg  total) by mouth daily.   TRELEGY ELLIPTA 100-62.5-25 MCG/ACT AEPB INHALE 1 PUFF BY MOUTH EVERY DAY   [DISCONTINUED] rosuvastatin (CRESTOR) 10 MG tablet TAKE 1 TABLET (10 MG TOTAL) BY MOUTH DAILY. NEEDS APPT FOR FURTHER REFILL. SEE MYCHART MESSAGE   rosuvastatin (CRESTOR) 10 MG tablet Take 1 tablet (10 mg total) by mouth daily.   No facility-administered encounter medications on file as of 06/09/2022.     Lab Results  Component Value Date   WBC 2.6 (L) 06/09/2022   HGB 9.7 (L) 06/09/2022   HCT 29.1 (L) 06/09/2022   PLT 172.0 06/09/2022   GLUCOSE 105 (H) 06/09/2022   CHOL 106 06/09/2022   TRIG 133.0 06/09/2022   HDL 36.10 (L) 06/09/2022   LDLDIRECT 127.1 08/25/2012   LDLCALC 43 06/09/2022   ALT 23 06/09/2022   AST 31 06/09/2022   NA 144 06/09/2022   K 4.5 06/09/2022   CL 104 06/09/2022   CREATININE 1.65 (H) 06/09/2022   BUN 27 (H) 06/09/2022   CO2 28 06/09/2022   TSH 0.59 02/03/2022   HGBA1C 6.2 06/09/2022    [place] OR NERVE BLOCK-IMAGE ONLY [facility])  Result Date: 09/24/2021 There is no interpretation for this exam.  This order is for images obtained during a surgical procedure.  Please See "Surgeries" Tab for more information regarding the procedure.   DG MINI C-ARM IMAGE ONLY  Result Date: 09/24/2021 There is no interpretation for this exam.  This order is for images obtained during a surgical procedure.  Please See "Surgeries" Tab for more information regarding the procedure.       Assessment & Plan:  Essential hypertension Assessment & Plan: Continue aldactone and metoprolol.  Blood pressure doing well.  Follow pressures.  Follow metabolic panel.  Blood pressure rechecked by me 128/72.   Orders: -     Basic metabolic panel -     CBC with Differential/Platelet  Hyperglycemia Assessment & Plan: Low carb diet and exercise.  Follow met b and a1c.   Orders: -     Hemoglobin A1c  Hypercholesterolemia Assessment & Plan: Continue crestor.  Low cholesterol diet and  exercise.  Follow lipid panel and liver function tests.    Orders: -     Lipid panel -     Hepatic function panel  Anemia, unspecified type Assessment & Plan: Has been evaluated by hematology previously.  Had recommended following as long as hgb stable.  Recheck cbc and iron studies today.  Discussed with her.  States she is ok with rechecking, but desires no further w/up or evaluation at this time.   Orders: -     IBC + Ferritin -     CBC with Differential/Platelet  Health care maintenance Assessment & Plan: Physical today 06/09/22.  Mammogram 03/06/21 - [facility].  Request mammogram every other year.  Colonoscopy 04/2014.  Prolia 06/01/22.    Barrett's esophagus without dysplasia Assessment & Plan: No upper symptoms.  On prevacid.    Stage 3 chronic kidney disease, unspecified whether stage 3a or 3b CKD ([place]) Assessment & Plan: Has been evaluated by Dr Candiss Norse for CKD. Has been stable. Avoid antiinflammatories.  Follow renal function with lasix use.    COPD with hypoxia [facility]) Assessment & Plan: She feels her breathing is stable.  Continue trelegy.  Continue nocturnal oxygen.  Follow.    Gastroesophageal reflux disease with esophagitis without hemorrhage Assessment & Plan: Continue prevacid.  Controlled.    Pulmonary HTN ([place]) Assessment & Plan: Continue trelegy.  Continue oxygen.  Followed by pulmonary and cardiology.     Other orders -     Rosuvastatin Calcium; Take 1 tablet (10 mg total) by mouth daily.  Dispense: 90 tablet; Refill: 3     [name], MD

## 2022-06-10 LAB — HEPATIC FUNCTION PANEL
ALT: 23 U/L (ref 0–35)
AST: 31 U/L (ref 0–37)
Albumin: 4.3 g/dL (ref 3.5–5.2)
Alkaline Phosphatase: 58 U/L (ref 39–117)
Bilirubin, Direct: 0.1 mg/dL (ref 0.0–0.3)
Total Bilirubin: 0.4 mg/dL (ref 0.2–1.2)
Total Protein: 6.5 g/dL (ref 6.0–8.3)

## 2022-06-10 LAB — IBC + FERRITIN
Ferritin: 30.2 ng/mL (ref 10.0–291.0)
Iron: 53 ug/dL (ref 42–145)
Saturation Ratios: 14.7 % — ABNORMAL LOW (ref 20.0–50.0)
TIBC: 359.8 ug/dL (ref 250.0–450.0)
Transferrin: 257 mg/dL (ref 212.0–360.0)

## 2022-06-10 LAB — BASIC METABOLIC PANEL
BUN: 27 mg/dL — ABNORMAL HIGH (ref 6–23)
CO2: 28 mEq/L (ref 19–32)
Calcium: 9.8 mg/dL (ref 8.4–10.5)
Chloride: 104 mEq/L (ref 96–112)
Creatinine, Ser: 1.65 mg/dL — ABNORMAL HIGH (ref 0.40–1.20)
GFR: 27.66 mL/min — ABNORMAL LOW (ref >60.00)
Glucose, Bld: 105 mg/dL — ABNORMAL HIGH (ref 70–99)
Potassium: 4.5 mEq/L (ref 3.5–5.1)
Sodium: 144 mEq/L (ref 135–145)

## 2022-06-10 LAB — LIPID PANEL
Cholesterol: 106 mg/dL (ref 0–200)
HDL: 36.1 mg/dL — ABNORMAL LOW (ref >39.00)
LDL Cholesterol: 43 mg/dL (ref 0–99)
NonHDL: 69.46
Total CHOL/HDL Ratio: 3
Triglycerides: 133 mg/dL (ref 0.0–149.0)
VLDL: 26.6 mg/dL (ref 0.0–40.0)

## 2022-06-11 ENCOUNTER — Other Ambulatory Visit: Payer: Self-pay

## 2022-06-11 DIAGNOSIS — R944 Abnormal results of kidney function studies: Secondary | ICD-10-CM

## 2022-06-20 ENCOUNTER — Encounter: Payer: Self-pay | Admitting: Internal Medicine

## 2022-06-20 NOTE — Assessment & Plan Note (Signed)
Has been evaluated by Dr Candiss Norse for CKD. Has been stable. Avoid antiinflammatories.  Follow renal function with lasix use.

## 2022-06-20 NOTE — Assessment & Plan Note (Signed)
Continue crestor.  Low cholesterol diet and exercise.  Follow lipid panel and liver function tests.  

## 2022-06-20 NOTE — Assessment & Plan Note (Signed)
Has been evaluated by hematology previously.  Had recommended following as long as hgb stable.  Recheck cbc and iron studies today.  Discussed with her.  States she is ok with rechecking, but desires no further w/up or evaluation at this time.

## 2022-06-20 NOTE — Assessment & Plan Note (Signed)
Continue aldactone and metoprolol.  Blood pressure doing well.  Follow pressures.  Follow metabolic panel.  Blood pressure rechecked by me 128/72.

## 2022-06-20 NOTE — Assessment & Plan Note (Signed)
Low carb diet and exercise.  Follow met b and a1c.   

## 2022-06-20 NOTE — Assessment & Plan Note (Signed)
She feels her breathing is stable.  Continue trelegy.  Continue nocturnal oxygen.  Follow.

## 2022-06-20 NOTE — Assessment & Plan Note (Signed)
Continue trelegy.  Continue oxygen.  Followed by pulmonary and cardiology.

## 2022-06-20 NOTE — Assessment & Plan Note (Signed)
Continue prevacid.  Controlled.

## 2022-06-20 NOTE — Assessment & Plan Note (Signed)
No upper symptoms.  On prevacid.

## 2022-06-28 ENCOUNTER — Other Ambulatory Visit (INDEPENDENT_AMBULATORY_CARE_PROVIDER_SITE_OTHER): Payer: Medicare Other

## 2022-06-28 DIAGNOSIS — D649 Anemia, unspecified: Secondary | ICD-10-CM | POA: Diagnosis not present

## 2022-06-28 DIAGNOSIS — R944 Abnormal results of kidney function studies: Secondary | ICD-10-CM

## 2022-06-28 LAB — BASIC METABOLIC PANEL
BUN: 28 mg/dL — ABNORMAL HIGH (ref 6–23)
CO2: 30 mEq/L (ref 19–32)
Calcium: 9.9 mg/dL (ref 8.4–10.5)
Chloride: 100 mEq/L (ref 96–112)
Creatinine, Ser: 1.64 mg/dL — ABNORMAL HIGH (ref 0.40–1.20)
GFR: 27.85 mL/min — ABNORMAL LOW (ref >60.00)
Glucose, Bld: 103 mg/dL — ABNORMAL HIGH (ref 70–99)
Potassium: 4.6 mEq/L (ref 3.5–5.1)
Sodium: 140 mEq/L (ref 135–145)

## 2022-06-28 LAB — FERRITIN: Ferritin: 28.8 ng/mL (ref 10.0–291.0)

## 2022-07-01 ENCOUNTER — Other Ambulatory Visit: Payer: Self-pay

## 2022-07-01 DIAGNOSIS — R944 Abnormal results of kidney function studies: Secondary | ICD-10-CM

## 2022-07-28 ENCOUNTER — Other Ambulatory Visit (INDEPENDENT_AMBULATORY_CARE_PROVIDER_SITE_OTHER): Payer: Medicare Other

## 2022-07-28 DIAGNOSIS — R944 Abnormal results of kidney function studies: Secondary | ICD-10-CM | POA: Diagnosis not present

## 2022-07-28 LAB — BASIC METABOLIC PANEL
BUN: 20 mg/dL (ref 6–23)
CO2: 29 mEq/L (ref 19–32)
Calcium: 9.3 mg/dL (ref 8.4–10.5)
Chloride: 103 mEq/L (ref 96–112)
Creatinine, Ser: 1.5 mg/dL — ABNORMAL HIGH (ref 0.40–1.20)
GFR: 30.98 mL/min — ABNORMAL LOW (ref >60.00)
Glucose, Bld: 100 mg/dL — ABNORMAL HIGH (ref 70–99)
Potassium: 4.9 mEq/L (ref 3.5–5.1)
Sodium: 141 mEq/L (ref 135–145)

## 2022-08-02 ENCOUNTER — Ambulatory Visit (INDEPENDENT_AMBULATORY_CARE_PROVIDER_SITE_OTHER): Payer: Medicare Other | Admitting: Pulmonary Disease

## 2022-08-02 ENCOUNTER — Encounter: Payer: Self-pay | Admitting: Pulmonary Disease

## 2022-08-02 VITALS — BP 122/84 | HR 81 | Temp 97.7°F | Ht 63.0 in | Wt 132.0 lb

## 2022-08-02 DIAGNOSIS — G4736 Sleep related hypoventilation in conditions classified elsewhere: Secondary | ICD-10-CM | POA: Diagnosis not present

## 2022-08-02 DIAGNOSIS — J449 Chronic obstructive pulmonary disease, unspecified: Secondary | ICD-10-CM | POA: Diagnosis not present

## 2022-08-02 DIAGNOSIS — J4489 Other specified chronic obstructive pulmonary disease: Secondary | ICD-10-CM

## 2022-08-02 MED ORDER — ALBUTEROL SULFATE (2.5 MG/3ML) 0.083% IN NEBU: INHALATION_SOLUTION | RESPIRATORY_TRACT | 5 refills | Status: DC

## 2022-08-02 NOTE — Patient Instructions (Addendum)
Your lungs sounded really clear today.  Your heart murmur sounded the same as prior.  I do not think that this is worse.  Continue using your Trelegy and as needed albuterol.  Continue using your oxygen at nighttime.  We will see you in follow-up in 3 to 4 months time call sooner should any new problems arise.

## 2022-08-02 NOTE — Progress Notes (Addendum)
Subjective:    Patient ID: Anita Ferrell, female    DOB: 1932/10/22, 89 y.o.   MRN: [id number] Patient Care Team: Dale Saco, MD as PCP - General (Internal Medicine) Salena Saner, MD as Consulting Physician (Pulmonary Disease)  Chief Complaint  Patient presents with   Follow-up    Doing Good! SOB with exertion. No wheezing. Cough with clear sputum.   PULMONARY PROBLEMS:  Remote smoker Moderate COPD/emphysema Lung nodule -decreased in size, no further follow-up H/O positive PPD - S/P 9 months INH.   HPI Is an 89 year old remote former smoker who follows up for COPD and nocturnal hypoxemia due to bronchitis.  Last seen on 01 February 2022 by me, she was noted to be doing well at that time.  She is using Trelegy Ellipta 100/25, 1 inhalation daily.  Feels she is doing well with this medication.  Rare use of albuterol.  On nocturnal oxygen at 2 L/min feels refreshed when she wakes up in the morning.  Compliant.  Notes benefit from the therapy.  No overt complaint today no chest pain, fevers, chills or sweats.  No orthopnea or paroxysmal nocturnal dyspnea.  No lower extremity edema, no calf tenderness.  Recently moved to the [place] area and notes that she is more active in her new home.  She has had more issues with ambulation due to lower extremity pain which is attributed to her osteoporosis.  She states that this is getting better on her Prolia infusions.   Overall she feels well and looks well.   DATA: CT chest 07/29/15:Significant decrease in size of the previously demonstrated right lower lobe nodule, compatible with a benign process. No significant change in multiple additional sub cm nodules and calcified granulomata in both lungs, compatible with a benign process. Mild changes of COPD and chronic bronchitis. Resolved infection in the lingula and left lower lobe PFTs 07/29/15: moderate obstruction, normal lung volumes, mild reduction DLCO.  TEE 10/19/16: LVEF 45%. Moderate  mitral regurgitation, moderate TR [facility] 10/25/16: Nonobstructive coronary disease. Moderate pulmonary hypertension (systolic 50 mmHg, mean 30 mmHg) 2D echo 07/17/2019: LVEF 55 to 60%, indeterminate diastolic parameters.  Normal pulmonary artery systolic pressure.  Tricuspid regurgitation mild to moderate.  No aortic stenosis.  Review of Systems A 10 point review of systems was performed and it is as noted above otherwise negative.  Patient Active Problem List   Diagnosis Date Noted   Osteoporosis 08/19/2020   Low back pain 05/20/2020   Left hip pain 05/20/2020   Irregular heart rhythm 01/15/2020   Nocturnal hypoxia 12/18/2019   Pulmonary HTN 12/26/2016   Moderate mitral regurgitation 10/27/2016   Bradycardia 09/23/2016   Ventricular bigeminy 09/23/2016   Diarrhea 01/13/2016   CKD (chronic kidney disease) stage 3, GFR 30-59 ml/min 10/16/2015   Shortness of breath 03/11/2015   COPD with hypoxia 03/11/2015   Environmental allergies 01/07/2015   Health care maintenance 05/29/2014   Nephrolithiasis 03/13/2014   Obesity 03/10/2014   Splenomegaly 03/05/2014   Rectal bleeding 03/05/2014   Hyperglycemia 12/30/2012   Hemorrhoids 12/30/2012   Grade I internal hemorrhoids 12/30/2012   Anemia 08/27/2012   Hypercholesterolemia 08/25/2012   Essential hypertension 05/28/2012   Asthma, chronic 05/28/2012   GERD (gastroesophageal reflux disease) 05/28/2012   Barrett's esophagus 05/28/2012   Social History   Tobacco Use   Smoking status: Former    Packs/day: 1.00    Years: 40.00    Additional pack years: 0.00    Total pack years: 40.00  Types: Cigarettes    Quit date: 08/17/1990    Years since quitting: 31.9   Smokeless tobacco: Never   Tobacco comments:    Quit in 1992  Substance Use Topics   Alcohol use: No    Alcohol/week: 0.0 standard drinks of alcohol   Social History   Tobacco Use   Smoking status: Former    Packs/day: 1.00    Years: 40.00    Additional pack years:  0.00    Total pack years: 40.00    Types: Cigarettes    Quit date: 08/17/1990    Years since quitting: 31.9   Smokeless tobacco: Never   Tobacco comments:    Quit in 1992  Substance Use Topics   Alcohol use: No    Alcohol/week: 0.0 standard drinks of alcohol   Allergies  Allergen Reactions   Penicillin G Swelling   Penicillins Swelling and Other (See Comments)    Has patient had a PCN reaction causing immediate rash, facial/tongue/throat swelling, SOB or lightheadedness with hypotension: Yes Has patient had a PCN reaction causing severe rash involving mucus membranes or skin necrosis: No Has patient had a PCN reaction that required hospitalization No Has patient had a PCN reaction occurring within the last 10 years: No If all of the above answers are "NO", then may proceed with Cephalosporin use.   Current Meds  Medication Sig   acetaminophen (TYLENOL) 500 MG tablet Take 500 mg by mouth as needed.    albuterol (PROVENTIL) (2.5 MG/3ML) 0.083% nebulizer solution INHALE CONTENTS 1 VIAL VIA NEBULIZER EVERY 6 HOURS AS NEEDED WHEEZE/SHORTNESS OF BREATH J44.9   albuterol (VENTOLIN HFA) 108 (90 Base) MCG/ACT inhaler Inhale 1-2 puffs into the lungs every 6 (six) hours as needed for wheezing or shortness of breath.   aspirin EC 81 MG tablet Take 81 mg by mouth daily.   azelastine (ASTELIN) 0.1 % nasal spray Use in each nostril as directed   Calcium Carbonate-Vitamin D 600-400 MG-UNIT tablet Take 1 tablet by mouth at bedtime.   famotidine (PEPCID) 20 MG tablet Take 20 mg by mouth daily as needed for heartburn or indigestion.   ferrous sulfate 325 (65 FE) MG tablet Take 325 mg by mouth 2 (two) times daily with a meal.    fexofenadine (ALLEGRA) 180 MG tablet Take 180 mg by mouth daily.   furosemide (LASIX) 20 MG tablet Take 1 tablet (20 mg total) by mouth daily.   lansoprazole (PREVACID) 30 MG capsule Take 30 mg by mouth daily.   meclizine (ANTIVERT) 25 MG tablet Take 25 mg by mouth as needed  for dizziness.   metoprolol tartrate (LOPRESSOR) 25 MG tablet Take 1 tablet (25 mg total) by mouth 2 (two) times daily.   Multiple Vitamins-Minerals (CENTRUM SILVER PO) Take by mouth.   OXYGEN Inhale 2 L into the lungs at bedtime.   Probiotic Product (ALIGN PO) Take by mouth.   rosuvastatin (CRESTOR) 10 MG tablet Take 1 tablet (10 mg total) by mouth daily.   spironolactone (ALDACTONE) 25 MG tablet Take 0.5 tablets (12.5 mg total) by mouth daily.   TRELEGY ELLIPTA 100-62.5-25 MCG/ACT AEPB INHALE 1 PUFF BY MOUTH EVERY DAY   Immunization History  Administered Date(s) Administered   Fluad Quad(high Dose 65+) 12/15/2018, 12/30/2020, 02/01/2022   Influenza Split 01/15/2020   Influenza, High Dose Seasonal PF 01/13/2016, 01/25/2017, 01/10/2018   Influenza,inj,Quad PF,6+ Mos 12/26/2012, 01/12/2014, 01/14/2015   Moderna Covid-19 Vaccine Bivalent Booster 24yrs & up 01/26/2021, 02/08/2022   Moderna Sars-Covid-2 Vaccination 05/02/2019, 05/30/2019,  02/14/2020, 08/11/2020   Pneumococcal Conjugate-13 09/02/2014   Pneumococcal Polysaccharide-23 11/14/2012   Tdap 11/14/2012       Objective:   Physical Exam BP 122/84 (BP Location: Left Arm, Cuff Size: Normal)   Pulse 81   Temp 97.7 F (36.5 C)   Ht  (1.6 m)   Wt 132 lb (59.9 kg) Comment: per the patient. in a wheelchair today.  SpO2 94%   BMI 23.38 kg/m   SpO2: 94 % O2 Device: None (Room air)  GENERAL: Well-nourished female, spry, no acute respiratory distress, presents in transport chair.  Very well-groomed.  Speech is fluent. HEAD: Normocephalic, atraumatic.  EYES: Pupils equal, round, reactive to light.  No scleral icterus.  MOUTH: Oral mucosa moist.  Dentures uppers and lowers NECK: Supple. No thyromegaly. No nodules. No JVD.  Trachea midline PULMONARY: Distant breath sounds, coarse, no other adventitious sounds CARDIOVASCULAR: S1 and S2. Regular rate and rhythm.  Grade 2/6 systolic ejection murmur consistent with mitral  regurg. GASTROINTESTINAL: Nondistended abdomen.   MUSCULOSKELETAL: No joint deformity, no clubbing, no edema.  NEUROLOGIC: No overt focal deficits, speech fluent. SKIN: Intact,warm,dry.  Limited exam no rashes. PSYCH: Mood and behavior normal.     Assessment & Plan:     ICD-10-CM   1. COPD, moderate  J44.9    Open stated on her current regimen Continue Trelegy Ellipta Continue as needed albuterol    2. Nocturnal hypoxemia due to obstructive chronic bronchitis  J44.89    G47.36    Compliant with oxygen at 2 L/min nocturnally Notes benefit from the therapy Continue same     Patient appears to be well compensated today.  She is to continue regimen as is.  Will see her in follow-up in 3 to 4 months time she is to contact us prior to that time should any new difficulties arise.  Gailen Shelter, MD Advanced Bronchoscopy P[facility]    *This note was dictated using voice recognition software/Dragon.  Despite best efforts to proofread, errors can occur which can change the meaning. Any transcriptional errors that result from this process are unintentional and may not be fully corrected at the time of dictation.

## 2022-08-13 ENCOUNTER — Encounter: Payer: Self-pay | Admitting: Pulmonary Disease

## 2022-08-13 NOTE — Progress Notes (Signed)
Subjective:    Patient ID: Anita Ferrell, female    DOB: 1932/06/18, 89 y.o.   MRN: [id number] Patient Care Team: Dale Brogden, MD as PCP - General (Internal Medicine) Salena Saner, MD as Consulting Physician (Pulmonary Disease)  Chief Complaint  Patient presents with   Follow-up    COPD. Constant SOB. Occasional wheezing. Cough with clear sputum.    HPI [name] is an 89 year old remote former smoker who follows up for COPD and nocturnal hypoxemia due to bronchitis.  Last seen on 08/05/2021 by me, she was noted to be doing well at that time.  She is using Trelegy Ellipta 100/25, 1 inhalation daily.  Feels she is doing well with this medication.  Rare use of albuterol.  On nocturnal oxygen at 2 L/min feels refreshed when she wakes up in the morning.  Compliant.  Notes benefit from the therapy.  No overt complaint today no chest pain, fevers, chills or sweats.  No orthopnea or paroxysmal nocturnal dyspnea.  No lower extremity edema, no calf tenderness.  She lives with her son [name] helps her with her activities of daily living.  Overall she feels that she is doing well.   She desires to get flu vaccine today.  DATA: CT chest 07/29/15:Significant decrease in size of the previously demonstrated right lower lobe nodule, compatible with a benign process. No significant change in multiple additional sub cm nodules and calcified granulomata in both lungs, compatible with a benign process. Mild changes of COPD and chronic bronchitis. Resolved infection in the lingula and left lower lobe PFTs 07/29/15: moderate obstruction, normal lung volumes, mild reduction DLCO.  TEE 10/19/16: LVEF 45%. Moderate mitral regurgitation, moderate TR [facility] 10/25/16: Nonobstructive coronary disease. Moderate pulmonary hypertension (systolic 50 mmHg, mean 30 mmHg) 2D echo 07/17/2019: LVEF 55 to 60%, indeterminate diastolic parameters.  Normal pulmonary artery systolic pressure. Tricuspid regurgitation mild to  moderate.  No aortic stenosis.  Review of Systems A 10 point review of systems was performed and it is as noted above otherwise negative.  Patient Active Problem List   Diagnosis Date Noted   Osteoporosis 08/19/2020   Low back pain 05/20/2020   Left hip pain 05/20/2020   Irregular heart rhythm 01/15/2020   Nocturnal hypoxia 12/18/2019   Pulmonary HTN (HCC) 12/26/2016   Moderate mitral regurgitation 10/27/2016   Bradycardia 09/23/2016   Ventricular bigeminy 09/23/2016   Diarrhea 01/13/2016   CKD (chronic kidney disease) stage 3, GFR 30-59 ml/min (HCC) 10/16/2015   Shortness of breath 03/11/2015   COPD with hypoxia (HCC) 03/11/2015   Environmental allergies 01/07/2015   Health care maintenance 05/29/2014   Nephrolithiasis 03/13/2014   Obesity 03/10/2014   Splenomegaly 03/05/2014   Rectal bleeding 03/05/2014   Hyperglycemia 12/30/2012   Hemorrhoids 12/30/2012   Grade I internal hemorrhoids 12/30/2012   Anemia 08/27/2012   Hypercholesterolemia 08/25/2012   Essential hypertension 05/28/2012   Asthma, chronic 05/28/2012   GERD (gastroesophageal reflux disease) 05/28/2012   Barrett's esophagus 05/28/2012   Social History   Tobacco Use   Smoking status: Former    Packs/day: 1.00    Years: 40.00    Additional pack years: 0.00    Total pack years: 40.00    Types: Cigarettes    Quit date: 08/17/1990    Years since quitting: 32.0   Smokeless tobacco: Never   Tobacco comments:    Quit in 1992  Substance Use Topics   Alcohol use: No    Alcohol/week: 0.0 standard drinks of alcohol  Allergies  Allergen Reactions   Penicillin G Swelling   Penicillins Swelling and Other (See Comments)    Has patient had a PCN reaction causing immediate rash, facial/tongue/throat swelling, SOB or lightheadedness with hypotension: Yes Has patient had a PCN reaction causing severe rash involving mucus membranes or skin necrosis: No Has patient had a PCN reaction that required hospitalization  No Has patient had a PCN reaction occurring within the last 10 years: No If all of the above answers are "NO", then may proceed with Cephalosporin use.   Current Meds  Medication Sig   acetaminophen (TYLENOL) 500 MG tablet Take 500 mg by mouth as needed.    albuterol (VENTOLIN HFA) 108 (90 Base) MCG/ACT inhaler Inhale 1-2 puffs into the lungs every 6 (six) hours as needed for wheezing or shortness of breath.   aspirin EC 81 MG tablet Take 81 mg by mouth daily.   azelastine (ASTELIN) 0.1 % nasal spray Use in each nostril as directed   Calcium Carbonate-Vitamin D 600-400 MG-UNIT tablet Take 1 tablet by mouth at bedtime.   famotidine (PEPCID) 20 MG tablet Take 20 mg by mouth daily as needed for heartburn or indigestion.   ferrous sulfate 325 (65 FE) MG tablet Take 325 mg by mouth 2 (two) times daily with a meal.    fexofenadine (ALLEGRA) 180 MG tablet Take 180 mg by mouth daily.   lansoprazole (PREVACID) 30 MG capsule Take 30 mg by mouth daily.   meclizine (ANTIVERT) 25 MG tablet Take 25 mg by mouth as needed for dizziness.   Multiple Vitamins-Minerals (CENTRUM SILVER PO) Take by mouth.   OXYGEN Inhale 2 L into the lungs at bedtime.   Probiotic Product (ALIGN PO) Take by mouth.   metoprolol tartrate (LOPRESSOR) 50 MG tablet TAKE 1/2 TABLET BY MOUTH TWICE A DAY   rosuvastatin (CRESTOR) 10 MG tablet TAKE 1 TABLET (10 MG TOTAL) BY MOUTH DAILY. NEEDS APPT FOR FURTHER REFILL. SEE MYCHART MESSAGE   spironolactone (ALDACTONE) 25 MG tablet TAKE 1/2 TABLET BY MOUTH EVERY DAY   TRELEGY ELLIPTA 100-62.5-25 MCG/ACT AEPB INHALE 1 PUFF BY MOUTH EVERY DAY   Immunization History  Administered Date(s) Administered   Fluad Quad(high Dose 65+) 12/15/2018, 12/30/2020, 02/01/2022   Influenza Split 01/15/2020   Influenza, High Dose Seasonal PF 01/13/2016, 01/25/2017, 01/10/2018   Influenza,inj,Quad PF,6+ Mos 12/26/2012, 01/12/2014, 01/14/2015   Moderna Covid-19 Vaccine Bivalent Booster 41yrs & up 01/26/2021,  02/08/2022   Moderna Sars-Covid-2 Vaccination 05/02/2019, 05/30/2019, 02/14/2020, 08/11/2020   Pneumococcal Conjugate-13 09/02/2014   Pneumococcal Polysaccharide-23 11/14/2012   Tdap 11/14/2012      Objective:   Physical Exam BP 120/80 (BP Location: Left Arm, Cuff Size: Normal)   Pulse 89   Temp (!) 97.5 F (36.4 C)   Ht 5\' 3"  (1.6 m)   Wt 132 lb 3.2 oz (60 kg)   SpO2 95%   BMI 23.42 kg/m   GENERAL: Well-nourished female, spry, no acute respiratory distress, fully ambulatory. HEAD: Normocephalic, atraumatic.  EYES: Pupils equal, round, reactive to light.  No scleral icterus.  MOUTH: Dentures both.  Oral mucosa moist. NECK: Supple. No thyromegaly. No nodules. No JVD.  Trachea midline PULMONARY: Distant breath sounds, coarse, no other adventitious sounds CARDIOVASCULAR: S1 and S2. Regular rate and rhythm.  Grade 2/6 systolic ejection murmur consistent with mitral regurg. GASTROINTESTINAL: Nondistended abdomen.   MUSCULOSKELETAL: No joint deformity, no clubbing, no edema.  NEUROLOGIC: No overt focal deficits speech fluent. SKIN: Intact,warm,dry.  Limited exam no rashes. PSYCH: Mood and  behavior normal.     Assessment & Plan:     ICD-10-CM   1. COPD, moderate (HCC)  J44.9    Continue Trelegy Ellipta Continue as needed albuterol    2. Nocturnal hypoxemia due to obstructive chronic bronchitis  J44.89    G47.36    Patient compliant with nocturnal oxygen at 2 L/min Patient notes benefit from the therapy Continue same    3. Need for immunization against influenza  Z23 Flu Vaccine QUAD High Dose(Fluad)   Patient received flu vaccine today     Orders Placed This Encounter  Procedures   Flu Vaccine QUAD High Dose(Fluad)   Will see the patient in follow-up in 6 months time she is to call sooner should any new problems arise.  Gailen Shelter, MD Advanced Bronchoscopy P[facility]    *This note was dictated using voice recognition  software/Dragon.  Despite best efforts to proofread, errors can occur which can change the meaning. Any transcriptional errors that result from this process are unintentional and may not be fully corrected at the time of dictation.

## 2022-08-26 ENCOUNTER — Other Ambulatory Visit: Payer: Self-pay | Admitting: Pulmonary Disease

## 2022-10-11 ENCOUNTER — Ambulatory Visit: Payer: Medicare Other | Admitting: Internal Medicine

## 2022-10-19 ENCOUNTER — Encounter: Payer: Self-pay | Admitting: Internal Medicine

## 2022-10-19 ENCOUNTER — Ambulatory Visit (INDEPENDENT_AMBULATORY_CARE_PROVIDER_SITE_OTHER): Payer: Medicare Other | Admitting: Internal Medicine

## 2022-10-19 VITALS — BP 134/76 | HR 89 | Temp 97.9°F | Resp 16 | Ht 64.0 in | Wt 132.0 lb

## 2022-10-19 DIAGNOSIS — K2270 Barrett's esophagus without dysplasia: Secondary | ICD-10-CM

## 2022-10-19 DIAGNOSIS — K2100 Gastro-esophageal reflux disease with esophagitis, without bleeding: Secondary | ICD-10-CM

## 2022-10-19 DIAGNOSIS — R739 Hyperglycemia, unspecified: Secondary | ICD-10-CM

## 2022-10-19 DIAGNOSIS — M810 Age-related osteoporosis without current pathological fracture: Secondary | ICD-10-CM

## 2022-10-19 DIAGNOSIS — E7800 Pure hypercholesterolemia, unspecified: Secondary | ICD-10-CM

## 2022-10-19 DIAGNOSIS — I272 Pulmonary hypertension, unspecified: Secondary | ICD-10-CM

## 2022-10-19 DIAGNOSIS — J449 Chronic obstructive pulmonary disease, unspecified: Secondary | ICD-10-CM

## 2022-10-19 DIAGNOSIS — G4734 Idiopathic sleep related nonobstructive alveolar hypoventilation: Secondary | ICD-10-CM | POA: Diagnosis not present

## 2022-10-19 DIAGNOSIS — D649 Anemia, unspecified: Secondary | ICD-10-CM

## 2022-10-19 DIAGNOSIS — I10 Essential (primary) hypertension: Secondary | ICD-10-CM | POA: Diagnosis not present

## 2022-10-19 DIAGNOSIS — N183 Chronic kidney disease, stage 3 unspecified: Secondary | ICD-10-CM

## 2022-10-19 NOTE — Progress Notes (Signed)
Subjective:    Patient ID: Anita Ferrell, female    DOB: 09/01/1932, 89 y.o.   MRN: [id number]  Patient here for  Chief Complaint  Patient presents with   Medical Management of Chronic Issues    HPI Here to follow up regarding  hypercholesterolemia, hypertension and COPD. Reports she is doing relatively well.  Breathing stable.  Continues on trelegy and oxygen at night.  Is followed by cardiology - f/u pulmonary hypertension.  On lasix.  Receiving prolia injections. Last 06/01/22.  Last bone density improved.  She has moved.  This has helped. Feels better. No chest pain.  No abdominal pain or bowel change reported.     Past Medical History:  Diagnosis Date   A-fib (HCC)    Allergy    Asthma    CHF (congestive heart failure) (HCC)    COPD (chronic obstructive pulmonary disease) (HCC)    GERD (gastroesophageal reflux disease)    Hypercholesteremia    Hypertension    Mitral valve insufficiency    Pulmonary hypertension (HCC)    Right Ventricular systolic pressure at 60 mm Hg by echo on july of 2011; Right heart cardic catherization revealed pulmonary artery pressusre of 27/10 with a mean of; Ventricular pressure 29/10 with pulmonary capillary wedge at 9   TB (pulmonary tuberculosis)    treated with INH therapy    Past Surgical History:  Procedure Laterality Date   CARDIAC CATHETERIZATION  10/2016   [facility]   CATARACT EXTRACTION Bilateral 2008   CHOLECYSTECTOMY     ORIF WRIST FRACTURE Right 09/24/2021   Procedure: OPEN REDUCTION INTERNAL FIXATION OF RIGHT DISTAL RADIUS FRACTURE;  Surgeon: Kennedy Bucker, MD;  Location: [facility] ORS;  Service: Orthopedics;  Laterality: Right;   TONSILLECTOMY  1964   Family History  Problem Relation Age of Onset   Heart disease Mother    Heart disease Father    Hypertension Sister    Heart disease Brother    Breast cancer Maternal Aunt    Social History   Socioeconomic History   Marital status: Widowed    Spouse name: Not on file   Number  of children: 3   Years of education: Not on file   Highest education level: Not on file  Occupational History   Not on file  Tobacco Use   Smoking status: Former    Packs/day: 1.00    Years: 40.00    Additional pack years: 0.00    Total pack years: 40.00    Types: Cigarettes    Quit date: 08/17/1990    Years since quitting: 32.2   Smokeless tobacco: Never   Tobacco comments:    Quit in 1992  Vaping Use   Vaping Use: Never used  Substance and Sexual Activity   Alcohol use: No    Alcohol/week: 0.0 standard drinks of alcohol   Drug use: No   Sexual activity: Not Currently  Other Topics Concern   Not on file  Social History Narrative   Not on file   Social Determinants of Health   Financial Resource Strain: Low Risk  (02/14/2018)   Overall Financial Resource Strain (CARDIA)    Difficulty of Paying Living Expenses: Not hard at all  Food Insecurity: No Food Insecurity (02/14/2018)   Hunger Vital Sign    Worried About Running Out of Food in the Last Year: Never true    Ran Out of Food in the Last Year: Never true  Transportation Needs: No Transportation Needs (02/14/2018)   PRAPARE -  Administrator, Civil Service (Medical): No    Lack of Transportation (Non-Medical): No  Physical Activity: Unknown (02/14/2018)   Exercise Vital Sign    Days of Exercise per Week: 0 days    Minutes of Exercise per Session: Not on file  Stress: No Stress Concern Present (02/14/2018)   [employer] of Occupational Health - Occupational Stress Questionnaire    Feeling of Stress : Not at all  Social Connections: Not on file     Review of Systems  Constitutional:  Negative for appetite change and unexpected weight change.  HENT:  Negative for congestion and sinus pressure.   Respiratory:  Negative for cough and chest tightness.        Breathing stable.   Cardiovascular:  Negative for chest pain and palpitations.       No increased swelling.   Gastrointestinal:  Negative for  abdominal pain, diarrhea, nausea and vomiting.  Genitourinary:  Negative for difficulty urinating and dysuria.  Musculoskeletal:  Negative for joint swelling and myalgias.  Skin:  Negative for color change and rash.  Neurological:  Negative for dizziness and headaches.  Psychiatric/Behavioral:  Negative for agitation and dysphoric mood.        Objective:     BP 134/76   Pulse 89   Temp 97.9 F (36.6 C)   Resp 16   Ht 5\' 4"  (1.626 m)   Wt 132 lb (59.9 kg)   SpO2 97%   BMI 22.66 kg/m  Wt Readings from Last 3 Encounters:  10/19/22 132 lb (59.9 kg)  08/02/22 132 lb (59.9 kg)  06/09/22 129 lb 12.8 oz (58.9 kg)    Physical Exam Vitals reviewed.  Constitutional:      General: She is not in acute distress.    Appearance: Normal appearance.  HENT:     Head: Normocephalic and atraumatic.     Right Ear: External ear normal.     Left Ear: External ear normal.  Eyes:     General: No scleral icterus.       Right eye: No discharge.        Left eye: No discharge.     Conjunctiva/sclera: Conjunctivae normal.  Neck:     Thyroid: No thyromegaly.  Cardiovascular:     Rate and Rhythm: Normal rate and regular rhythm.  Pulmonary:     Effort: No respiratory distress.     Breath sounds: Normal breath sounds. No wheezing.  Abdominal:     General: Bowel sounds are normal.     Palpations: Abdomen is soft.     Tenderness: There is no abdominal tenderness.  Musculoskeletal:        General: No swelling or tenderness.     Cervical back: Neck supple. No tenderness.  Lymphadenopathy:     Cervical: No cervical adenopathy.  Skin:    Findings: No erythema or rash.  Neurological:     Mental Status: She is alert.  Psychiatric:        Mood and Affect: Mood normal.        Behavior: Behavior normal.      Outpatient Encounter Medications as of 10/19/2022  Medication Sig   acetaminophen (TYLENOL) 500 MG tablet Take 500 mg by mouth as needed.    albuterol (PROVENTIL) (2.5 MG/3ML) 0.083%  nebulizer solution INHALE CONTENTS 1 VIAL VIA NEBULIZER EVERY 6 HOURS AS NEEDED WHEEZE/SHORTNESS OF BREATH J44.9   albuterol (VENTOLIN HFA) 108 (90 Base) MCG/ACT inhaler Inhale 1-2 puffs into the lungs every 6 (  six) hours as needed for wheezing or shortness of breath.   aspirin EC 81 MG tablet Take 81 mg by mouth daily.   azelastine (ASTELIN) 0.1 % nasal spray Use in each nostril as directed   Calcium Carbonate-Vitamin D 600-400 MG-UNIT tablet Take 1 tablet by mouth at bedtime.   famotidine (PEPCID) 20 MG tablet Take 20 mg by mouth daily as needed for heartburn or indigestion.   ferrous sulfate 325 (65 FE) MG tablet Take 325 mg by mouth 2 (two) times daily with a meal.    fexofenadine (ALLEGRA) 180 MG tablet Take 180 mg by mouth daily.   furosemide (LASIX) 20 MG tablet Take 1 tablet (20 mg total) by mouth daily.   lansoprazole (PREVACID) 30 MG capsule Take 30 mg by mouth daily.   meclizine (ANTIVERT) 25 MG tablet Take 25 mg by mouth as needed for dizziness.   metoprolol tartrate (LOPRESSOR) 25 MG tablet Take 1 tablet (25 mg total) by mouth 2 (two) times daily.   Multiple Vitamins-Minerals (CENTRUM SILVER PO) Take by mouth.   OXYGEN Inhale 2 L into the lungs at bedtime.   Probiotic Product (ALIGN PO) Take by mouth.   rosuvastatin (CRESTOR) 10 MG tablet Take 1 tablet (10 mg total) by mouth daily.   spironolactone (ALDACTONE) 25 MG tablet Take 0.5 tablets (12.5 mg total) by mouth daily.   TRELEGY ELLIPTA 100-62.5-25 MCG/ACT AEPB INHALE 1 PUFF BY MOUTH EVERY DAY   No facility-administered encounter medications on file as of 10/19/2022.     Lab Results  Component Value Date   WBC 2.6 (L) 06/09/2022   HGB 9.7 (L) 06/09/2022   HCT 29.1 (L) 06/09/2022   PLT 172.0 06/09/2022   GLUCOSE 100 (H) 07/28/2022   CHOL 106 06/09/2022   TRIG 133.0 06/09/2022   HDL 36.10 (L) 06/09/2022   LDLDIRECT 127.1 08/25/2012   LDLCALC 43 06/09/2022   ALT 23 06/09/2022   AST 31 06/09/2022   NA 141 07/28/2022   K  4.9 07/28/2022   CL 103 07/28/2022   CREATININE 1.50 (H) 07/28/2022   BUN 20 07/28/2022   CO2 29 07/28/2022   TSH 0.59 02/03/2022   HGBA1C 6.2 06/09/2022    [place] OR NERVE BLOCK-IMAGE ONLY [facility])  Result Date: 09/24/2021 There is no interpretation for this exam.  This order is for images obtained during a surgical procedure.  Please See "Surgeries" Tab for more information regarding the procedure.   DG MINI C-ARM IMAGE ONLY  Result Date: 09/24/2021 There is no interpretation for this exam.  This order is for images obtained during a surgical procedure.  Please See "Surgeries" Tab for more information regarding the procedure.       Assessment & Plan:  Anemia, unspecified type Assessment & Plan: Has been evaluated by hematology previously.  Had recommended following as long as hgb stable.  Hgb remains decreased, but stable last check - 9.7.  She desires no further w/up or evaluation at this time.    Barrett's esophagus without dysplasia Assessment & Plan: No upper symptoms.  On prevacid.    Stage 3 chronic kidney disease, unspecified whether stage 3a or 3b CKD (HCC) Assessment & Plan: Has been evaluated by Dr Thedore Mins for CKD. Has been stable. Avoid antiinflammatories.  Follow renal function with lasix use.    COPD with hypoxia [facility]) Assessment & Plan: She feels her breathing is stable.  Continue trelegy.  Continue nocturnal oxygen.  Follow.    Essential hypertension Assessment & Plan: Continue aldactone and  metoprolol.  Blood pressure doing well.  Follow pressures.  Follow metabolic panel.     Gastroesophageal reflux disease with esophagitis without hemorrhage Assessment & Plan: Continue prevacid.  Controlled.    Hypercholesterolemia Assessment & Plan: Continue crestor.  Low cholesterol diet and exercise.  Follow lipid panel and liver function tests.     Hyperglycemia Assessment & Plan: Low carb diet and exercise.  Follow met b and a1c.    Nocturnal  hypoxia Assessment & Plan: Continues with oxygen at night.  Follow.    Pulmonary HTN (HCC) Assessment & Plan: Continue trelegy.  Continue oxygen.  Followed by pulmonary and cardiology.     Osteoporosis without current pathological fracture, unspecified osteoporosis type Assessment & Plan: prolia last 05/2022.       Dale Newcastle, MD

## 2022-10-24 ENCOUNTER — Encounter: Payer: Self-pay | Admitting: Internal Medicine

## 2022-10-24 NOTE — Assessment & Plan Note (Signed)
Continues with oxygen at night.  Follow.  

## 2022-10-24 NOTE — Assessment & Plan Note (Signed)
Continue prevacid.  Controlled.  

## 2022-10-24 NOTE — Assessment & Plan Note (Signed)
She feels her breathing is stable.  Continue trelegy.  Continue nocturnal oxygen.  Follow.  

## 2022-10-24 NOTE — Assessment & Plan Note (Signed)
prolia last 05/2022.

## 2022-10-24 NOTE — Assessment & Plan Note (Signed)
Continue trelegy.  Continue oxygen.  Followed by pulmonary and cardiology.   

## 2022-10-24 NOTE — Assessment & Plan Note (Signed)
Continue aldactone and metoprolol.  Blood pressure doing well.  Follow pressures.  Follow metabolic panel.   °

## 2022-10-24 NOTE — Assessment & Plan Note (Signed)
Continue crestor.  Low cholesterol diet and exercise.  Follow lipid panel and liver function tests.  

## 2022-10-24 NOTE — Assessment & Plan Note (Signed)
Has been evaluated by Dr Singh for CKD. Has been stable. Avoid antiinflammatories.  Follow renal function with lasix use.  

## 2022-10-24 NOTE — Assessment & Plan Note (Signed)
Low carb diet and exercise.  Follow met b and a1c.   

## 2022-10-24 NOTE — Assessment & Plan Note (Signed)
No upper symptoms.  On prevacid.  

## 2022-10-24 NOTE — Assessment & Plan Note (Signed)
Has been evaluated by hematology previously.  Had recommended following as long as hgb stable.  Hgb remains decreased, but stable last check - 9.7.  She desires no further w/up or evaluation at this time.

## 2022-11-24 ENCOUNTER — Ambulatory Visit (INDEPENDENT_AMBULATORY_CARE_PROVIDER_SITE_OTHER): Payer: Medicare Other | Admitting: Pulmonary Disease

## 2022-11-24 ENCOUNTER — Encounter: Payer: Self-pay | Admitting: Pulmonary Disease

## 2022-11-24 VITALS — BP 110/80 | HR 76 | Temp 97.5°F | Ht 64.0 in | Wt 132.6 lb

## 2022-11-24 DIAGNOSIS — J449 Chronic obstructive pulmonary disease, unspecified: Secondary | ICD-10-CM

## 2022-11-24 DIAGNOSIS — J4489 Other specified chronic obstructive pulmonary disease: Secondary | ICD-10-CM | POA: Diagnosis not present

## 2022-11-24 DIAGNOSIS — G4736 Sleep related hypoventilation in conditions classified elsewhere: Secondary | ICD-10-CM

## 2022-11-24 MED ORDER — TRELEGY ELLIPTA 100-62.5-25 MCG/ACT IN AEPB: 1.0000 | INHALATION_SPRAY | Freq: Every day | RESPIRATORY_TRACT | Status: DC

## 2022-11-24 NOTE — Progress Notes (Signed)
Subjective:    Patient ID: Anita Ferrell, female    DOB: 03-Apr-1933, 89 y.o.   MRN: [id number]  Patient Care Team: Dale Maize, MD as PCP - General (Internal Medicine) Salena Saner, MD as Consulting Physician (Pulmonary Disease)  Chief Complaint  Patient presents with   Follow-up    DOE. No wheezing. Cough with clear sputum.   HPI  Anita Ferrell is an 89 year old remote former smoker who follows up for COPD and nocturnal hypoxemia due to chronic bronchitis.  Last seen on 02 August 2022 by me, she was noted to be doing well at that time.  She is using Trelegy Ellipta 100/25, 1 inhalation daily.  Feels she is doing well with this medication.  Rare use of albuterol.  On nocturnal oxygen at 2 L/min feels refreshed when she wakes up in the morning.  Compliant.  Notes benefit from the therapy.  No overt complaint today no chest pain, fevers, chills or sweats.  No orthopnea or paroxysmal nocturnal dyspnea.  No lower extremity edema, no calf tenderness.  Since her move to the [place] area she is more active in her new home.  Previously she had some issues with ambulation due to lower extremity pain which is attributed to her osteoporosis.  This has now pretty much resolved on her Prolia infusions and with increased activity. She does not endorse any symptomatology today.   Overall she feels well and looks well.  She is very spry.   DATA: CT chest 07/29/15:Significant decrease in size of the previously demonstrated right lower lobe nodule, compatible with a benign process. No significant change in multiple additional sub cm nodules and calcified granulomata in both lungs, compatible with a benign process. Mild changes of COPD and chronic bronchitis. Resolved infection in the lingula and left lower lobe PFTs 07/29/15: moderate obstruction, normal lung volumes, mild reduction DLCO.  TEE 10/19/16: LVEF 45%. Moderate mitral regurgitation, moderate TR [facility] 10/25/16: Nonobstructive coronary disease.  Moderate pulmonary hypertension (systolic 50 mmHg, mean 30 mmHg) 2D echo 07/17/2019: LVEF 55 to 60%, indeterminate diastolic parameters.  Normal pulmonary artery systolic pressure.  Tricuspid regurgitation mild to moderate.  No aortic stenosis.  Review of Systems A 10 point review of systems was performed and it is as noted above otherwise negative.   Patient Active Problem List   Diagnosis Date Noted   Osteoporosis 08/19/2020   Low back pain 05/20/2020   Left hip pain 05/20/2020   Irregular heart rhythm 01/15/2020   Nocturnal hypoxia 12/18/2019   Pulmonary HTN (HCC) 12/26/2016   Moderate mitral regurgitation 10/27/2016   Bradycardia 09/23/2016   Ventricular bigeminy 09/23/2016   Diarrhea 01/13/2016   CKD (chronic kidney disease) stage 3, GFR 30-59 ml/min (HCC) 10/16/2015   Shortness of breath 03/11/2015   COPD with hypoxia (HCC) 03/11/2015   Environmental allergies 01/07/2015   Health care maintenance 05/29/2014   Nephrolithiasis 03/13/2014   Obesity 03/10/2014   Splenomegaly 03/05/2014   Rectal bleeding 03/05/2014   Hyperglycemia 12/30/2012   Hemorrhoids 12/30/2012   Grade I internal hemorrhoids 12/30/2012   Anemia 08/27/2012   Hypercholesterolemia 08/25/2012   Essential hypertension 05/28/2012   Asthma, chronic 05/28/2012   GERD (gastroesophageal reflux disease) 05/28/2012   Barrett's esophagus 05/28/2012    Social History   Tobacco Use   Smoking status: Former    Current packs/day: 0.00    Average packs/day: 1 pack/day for 40.0 years (40.0 ttl pk-yrs)    Types: Cigarettes    Start date: 08/17/1950  Quit date: 08/17/1990    Years since quitting: 32.2   Smokeless tobacco: Never   Tobacco comments:    Quit in 1992  Substance Use Topics   Alcohol use: No    Alcohol/week: 0.0 standard drinks of alcohol    Allergies  Allergen Reactions   Penicillin G Swelling   Penicillins Swelling and Other (See Comments)    Has patient had a PCN reaction causing immediate  rash, facial/tongue/throat swelling, SOB or lightheadedness with hypotension: Yes Has patient had a PCN reaction causing severe rash involving mucus membranes or skin necrosis: No Has patient had a PCN reaction that required hospitalization No Has patient had a PCN reaction occurring within the last 10 years: No If all of the above answers are "NO", then may proceed with Cephalosporin use.    No outpatient medications have been marked as taking for the 11/24/22 encounter (Office Visit) with Salena Saner, MD.    Immunization History  Administered Date(s) Administered   Fluad Quad(high Dose 65+) 12/15/2018, 12/30/2020, 02/01/2022   Influenza Split 01/15/2020   Influenza, High Dose Seasonal PF 01/13/2016, 01/25/2017, 01/10/2018   Influenza,inj,Quad PF,6+ Mos 12/26/2012, 01/12/2014, 01/14/2015   Moderna Covid-19 Vaccine Bivalent Booster 70yrs & up 01/26/2021, 02/08/2022   Moderna Sars-Covid-2 Vaccination 05/02/2019, 05/30/2019, 02/14/2020, 08/11/2020   Pneumococcal Conjugate-13 09/02/2014   Pneumococcal Polysaccharide-23 11/14/2012   Tdap 11/14/2012        Objective:   BP 110/80 (BP Location: Left Arm, Cuff Size: Normal)   Pulse 76   Temp (!) 97.5 F (36.4 C)   Ht 5\' 4"  (1.626 m)   Wt 132 lb 9.6 oz (60.1 kg)   SpO2 95%   BMI 22.76 kg/m   SpO2: 95 % O2 Device: None (Room air)  GENERAL: Well-nourished female, spry, no acute respiratory distress, ambulatory with assistance of a walker.  Very well-groomed.  Speech is fluent. HEAD: Normocephalic, atraumatic.  EYES: Pupils equal, round, reactive to light.  No scleral icterus.  MOUTH: Oral mucosa moist.  Dentures uppers and lowers NECK: Supple. No thyromegaly. No nodules. No JVD.  Trachea midline PULMONARY: Distant breath sounds, coarse, no other adventitious sounds CARDIOVASCULAR: S1 and S2. Regular rate and rhythm.  Grade 2/6 systolic ejection murmur consistent with mitral regurg. GASTROINTESTINAL: Nondistended abdomen.    MUSCULOSKELETAL: No joint deformity, no clubbing, no edema.  NEUROLOGIC: No overt focal deficits, speech fluent. SKIN: Intact,warm,dry.  Limited exam no rashes. PSYCH: Mood and behavior normal.   Assessment & Plan:     ICD-10-CM   1. COPD, moderate (HCC)  J44.9    Well compensated on current regimen Continue Trelegy Ellipta Continue as needed albuterol    2. Nocturnal hypoxemia due to obstructive chronic bronchitis  J44.89    G47.36    Compliant with oxygen therapy Notes benefit of therapy Continue same     Meds ordered this encounter  Medications   Fluticasone-Umeclidin-Vilant (TRELEGY ELLIPTA) 100-62.5-25 MCG/ACT AEPB    Sig: Inhale 1 puff into the lungs daily.    Order Specific Question:   Lot Number?    Answer:   [id number]    Order Specific Question:   Expiration Date?    Answer:   02/18/2024    Order Specific Question:   Quantity    Answer:   2   Will see the patient in follow-up in 6 months time she is to contact us prior to that time should any new difficulties arise.   Gailen Shelter, MD Advanced Bronchoscopy PCCM [facility]    *  This note was dictated using voice recognition software/Dragon.  Despite best efforts to proofread, errors can occur which can change the meaning. Any transcriptional errors that result from this process are unintentional and may not be fully corrected at the time of dictation.

## 2022-11-24 NOTE — Patient Instructions (Signed)
Continue taking your Trelegy once a day.  Make sure you rinse your mouth after use it.  Continue your oxygen at nighttime.  Your lungs sounded very clear today.  Will see you in follow-up in 6 months time call sooner should any new problems arise.

## 2022-12-02 DIAGNOSIS — M810 Age-related osteoporosis without current pathological fracture: Secondary | ICD-10-CM | POA: Diagnosis not present

## 2023-01-20 ENCOUNTER — Other Ambulatory Visit: Payer: Self-pay | Admitting: Cardiovascular Disease

## 2023-01-20 NOTE — Telephone Encounter (Signed)
Please contact pt for future appointment. Pt due for 12 month f/u. 

## 2023-01-20 NOTE — Telephone Encounter (Signed)
Pt is scheduled on 11/12.

## 2023-01-22 ENCOUNTER — Other Ambulatory Visit: Payer: Self-pay | Admitting: Pulmonary Disease

## 2023-01-27 DIAGNOSIS — Z23 Encounter for immunization: Secondary | ICD-10-CM | POA: Diagnosis not present

## 2023-02-21 ENCOUNTER — Ambulatory Visit (INDEPENDENT_AMBULATORY_CARE_PROVIDER_SITE_OTHER): Payer: Medicare Other | Admitting: Internal Medicine

## 2023-02-21 VITALS — BP 128/74 | HR 90 | Temp 98.2°F | Resp 16 | Ht 64.0 in | Wt 132.8 lb

## 2023-02-21 DIAGNOSIS — D649 Anemia, unspecified: Secondary | ICD-10-CM

## 2023-02-21 DIAGNOSIS — N183 Chronic kidney disease, stage 3 unspecified: Secondary | ICD-10-CM | POA: Diagnosis not present

## 2023-02-21 DIAGNOSIS — G4734 Idiopathic sleep related nonobstructive alveolar hypoventilation: Secondary | ICD-10-CM | POA: Diagnosis not present

## 2023-02-21 DIAGNOSIS — E7800 Pure hypercholesterolemia, unspecified: Secondary | ICD-10-CM | POA: Diagnosis not present

## 2023-02-21 DIAGNOSIS — I10 Essential (primary) hypertension: Secondary | ICD-10-CM

## 2023-02-21 DIAGNOSIS — J449 Chronic obstructive pulmonary disease, unspecified: Secondary | ICD-10-CM | POA: Diagnosis not present

## 2023-02-21 DIAGNOSIS — R739 Hyperglycemia, unspecified: Secondary | ICD-10-CM | POA: Diagnosis not present

## 2023-02-21 DIAGNOSIS — M810 Age-related osteoporosis without current pathological fracture: Secondary | ICD-10-CM

## 2023-02-21 DIAGNOSIS — I272 Pulmonary hypertension, unspecified: Secondary | ICD-10-CM

## 2023-02-21 LAB — IBC + FERRITIN
Ferritin: 21.2 ng/mL (ref 10.0–291.0)
Iron: 34 ug/dL — ABNORMAL LOW (ref 42–145)
Saturation Ratios: 10 % — ABNORMAL LOW (ref 20.0–50.0)
TIBC: 341.6 ug/dL (ref 250.0–450.0)
Transferrin: 244 mg/dL (ref 212.0–360.0)

## 2023-02-21 LAB — BASIC METABOLIC PANEL
BUN: 22 mg/dL (ref 6–23)
CO2: 30 meq/L (ref 19–32)
Calcium: 9.4 mg/dL (ref 8.4–10.5)
Chloride: 102 meq/L (ref 96–112)
Creatinine, Ser: 1.5 mg/dL — ABNORMAL HIGH (ref 0.40–1.20)
GFR: 30.86 mL/min — ABNORMAL LOW (ref >60.00)
Glucose, Bld: 96 mg/dL (ref 70–99)
Potassium: 4.7 meq/L (ref 3.5–5.1)
Sodium: 140 meq/L (ref 135–145)

## 2023-02-21 LAB — CBC WITH DIFFERENTIAL/PLATELET
Basophils Absolute: 0 10*3/uL (ref 0.0–0.1)
Basophils Relative: 0.7 % (ref 0.0–3.0)
Eosinophils Absolute: 0.1 10*3/uL (ref 0.0–0.7)
Eosinophils Relative: 4 % (ref 0.0–5.0)
HCT: 29.6 % — ABNORMAL LOW (ref 36.0–46.0)
Hemoglobin: 9 g/dL — ABNORMAL LOW (ref 12.0–15.0)
Lymphocytes Relative: 28.5 % (ref 12.0–46.0)
Lymphs Abs: 0.9 10*3/uL (ref 0.7–4.0)
MCHC: 30.3 g/dL (ref 30.0–36.0)
MCV: 84.5 fL (ref 78.0–100.0)
Monocytes Absolute: 0.2 10*3/uL (ref 0.1–1.0)
Monocytes Relative: 7.9 % (ref 3.0–12.0)
Neutro Abs: 1.9 10*3/uL (ref 1.4–7.7)
Neutrophils Relative %: 58.9 % (ref 43.0–77.0)
Platelets: 178 10*3/uL (ref 150.0–400.0)
RBC: 3.5 Mil/uL — ABNORMAL LOW (ref 3.87–5.11)
RDW: 16 % — ABNORMAL HIGH (ref 11.5–15.5)
WBC: 3.2 10*3/uL — ABNORMAL LOW (ref 4.0–10.5)

## 2023-02-21 LAB — HEPATIC FUNCTION PANEL
ALT: 18 U/L (ref 0–35)
AST: 31 U/L (ref 0–37)
Albumin: 4 g/dL (ref 3.5–5.2)
Alkaline Phosphatase: 62 U/L (ref 39–117)
Bilirubin, Direct: 0.1 mg/dL (ref 0.0–0.3)
Total Bilirubin: 0.3 mg/dL (ref 0.2–1.2)
Total Protein: 6.6 g/dL (ref 6.0–8.3)

## 2023-02-21 LAB — LIPID PANEL
Cholesterol: 101 mg/dL (ref 0–200)
HDL: 33.8 mg/dL — ABNORMAL LOW (ref >39.00)
LDL Cholesterol: 44 mg/dL (ref 0–99)
NonHDL: 67.07
Total CHOL/HDL Ratio: 3
Triglycerides: 113 mg/dL (ref 0.0–149.0)
VLDL: 22.6 mg/dL (ref 0.0–40.0)

## 2023-02-21 LAB — TSH: TSH: 0.91 u[IU]/mL (ref 0.35–5.50)

## 2023-02-21 LAB — VITAMIN D 25 HYDROXY (VIT D DEFICIENCY, FRACTURES): VITD: 54.49 ng/mL (ref 30.00–100.00)

## 2023-02-21 LAB — HEMOGLOBIN A1C: Hgb A1c MFr Bld: 6 % (ref 4.6–6.5)

## 2023-02-21 NOTE — Progress Notes (Unsigned)
Subjective:    Patient ID: Anita Ferrell, female    DOB: [birth year], 89 y.o.   MRN: [id number]  Patient here for  Chief Complaint  Patient presents with   Medical Management of Chronic Issues    HPI Here to follow up regarding  hypercholesterolemia, hypertension and COPD. Breathing stable. Continues on trelegy and oxygen at night. Is followed by cardiology - f/u pulmonary hypertension. On lasix. Reports breathing is stable.  No increased cough or congestion.  No chest pain.  Eating.  No abdominal pain or bowel change reported.  Doing well in her new home. Receiving prolia. Last injection 12/02/22.    Past Medical History:  Diagnosis Date   A-fib (HCC)    Allergy    Asthma    CHF (congestive heart failure) (HCC)    COPD (chronic obstructive pulmonary disease) (HCC)    GERD (gastroesophageal reflux disease)    Hypercholesteremia    Hypertension    Mitral valve insufficiency    Pulmonary hypertension (HCC)    Right Ventricular systolic pressure at 60 mm Hg by echo on july of 2011; Right heart cardic catherization revealed pulmonary artery pressusre of 27/10 with a mean of; Ventricular pressure 29/10 with pulmonary capillary wedge at 9   TB (pulmonary tuberculosis)    treated with INH therapy    Past Surgical History:  Procedure Laterality Date   CARDIAC CATHETERIZATION  10/2016   [facility]   CATARACT EXTRACTION Bilateral 2008   CHOLECYSTECTOMY     ORIF WRIST FRACTURE Right 09/24/2021   Procedure: OPEN REDUCTION INTERNAL FIXATION OF RIGHT DISTAL RADIUS FRACTURE;  Surgeon: Kennedy Bucker, MD;  Location: [facility] ORS;  Service: Orthopedics;  Laterality: Right;   TONSILLECTOMY  1964   Family History  Problem Relation Age of Onset   Heart disease Mother    Heart disease Father    Hypertension Sister    Heart disease Brother    Breast cancer Maternal Aunt    Social History   Socioeconomic History   Marital status: Widowed    Spouse name: Not on file   Number of children: 3    Years of education: Not on file   Highest education level: Not on file  Occupational History   Not on file  Tobacco Use   Smoking status: Former    Current packs/day: 0.00    Average packs/day: 1 pack/day for 40.0 years (40.0 ttl pk-yrs)    Types: Cigarettes    Start date: 08/17/1950    Quit date: 08/17/1990    Years since quitting: 32.5   Smokeless tobacco: Never   Tobacco comments:    Quit in 1992  Vaping Use   Vaping status: Never Used  Substance and Sexual Activity   Alcohol use: No    Alcohol/week: 0.0 standard drinks of alcohol   Drug use: No   Sexual activity: Not Currently  Other Topics Concern   Not on file  Social History Narrative   Not on file   Social Determinants of Health   Financial Resource Strain: Low Risk  (02/14/2018)   Overall Financial Resource Strain (CARDIA)    Difficulty of Paying Living Expenses: Not hard at all  Food Insecurity: No Food Insecurity (02/14/2018)   Hunger Vital Sign    Worried About Running Out of Food in the Last Year: Never true    Ran Out of Food in the Last Year: Never true  Transportation Needs: No Transportation Needs (02/14/2018)   PRAPARE - Transportation    Lack  of Transportation (Medical): No    Lack of Transportation (Non-Medical): No  Physical Activity: Unknown (02/14/2018)   Exercise Vital Sign    Days of Exercise per Week: 0 days    Minutes of Exercise per Session: Not on file  Stress: No Stress Concern Present (02/14/2018)   [employer] of Occupational Health - Occupational Stress Questionnaire    Feeling of Stress : Not at all  Social Connections: Not on file     Review of Systems  Constitutional:  Negative for appetite change and unexpected weight change.  HENT:  Negative for congestion and sinus pressure.   Respiratory:  Negative for cough, chest tightness and shortness of breath.   Cardiovascular:  Negative for chest pain and palpitations.  Gastrointestinal:  Negative for abdominal pain, diarrhea,  nausea and vomiting.  Genitourinary:  Negative for difficulty urinating and dysuria.  Musculoskeletal:  Negative for joint swelling and myalgias.  Skin:  Negative for color change and rash.  Neurological:  Negative for dizziness and headaches.  Psychiatric/Behavioral:  Negative for agitation and dysphoric mood.        Objective:     BP 128/74   Pulse 90   Temp 98.2 F (36.8 C)   Resp 16   Ht 5\' 4"  (1.626 m)   Wt 132 lb 12.8 oz (60.2 kg)   SpO2 97%   BMI 22.80 kg/m  Wt Readings from Last 3 Encounters:  02/21/23 132 lb 12.8 oz (60.2 kg)  11/24/22 132 lb 9.6 oz (60.1 kg)  10/19/22 132 lb (59.9 kg)    Physical Exam Vitals reviewed.  Constitutional:      General: She is not in acute distress.    Appearance: Normal appearance.  HENT:     Head: Normocephalic and atraumatic.     Right Ear: External ear normal.     Left Ear: External ear normal.  Eyes:     General: No scleral icterus.       Right eye: No discharge.        Left eye: No discharge.     Conjunctiva/sclera: Conjunctivae normal.  Neck:     Thyroid: No thyromegaly.  Cardiovascular:     Rate and Rhythm: Normal rate and regular rhythm.  Pulmonary:     Effort: No respiratory distress.     Breath sounds: Normal breath sounds. No wheezing.  Abdominal:     General: Bowel sounds are normal.     Palpations: Abdomen is soft.     Tenderness: There is no abdominal tenderness.  Musculoskeletal:        General: No swelling or tenderness.     Cervical back: Neck supple. No tenderness.  Lymphadenopathy:     Cervical: No cervical adenopathy.  Skin:    Findings: No erythema or rash.  Neurological:     Mental Status: She is alert.  Psychiatric:        Mood and Affect: Mood normal.        Behavior: Behavior normal.      Outpatient Encounter Medications as of 02/21/2023  Medication Sig   acetaminophen (TYLENOL) 500 MG tablet Take 500 mg by mouth as needed.    albuterol (PROVENTIL) (2.5 MG/3ML) 0.083% nebulizer  solution INHALE CONTENTS 1 VIAL VIA NEBULIZER EVERY 6 HOURS AS NEEDED WHEEZE/SHORTNESS OF BREATH J44.9   albuterol (VENTOLIN HFA) 108 (90 Base) MCG/ACT inhaler Inhale 1-2 puffs into the lungs every 6 (six) hours as needed for wheezing or shortness of breath.   aspirin EC 81 MG tablet  Take 81 mg by mouth daily.   azelastine (ASTELIN) 0.1 % nasal spray Use in each nostril as directed   Calcium Carbonate-Vitamin D 600-400 MG-UNIT tablet Take 1 tablet by mouth at bedtime.   famotidine (PEPCID) 20 MG tablet Take 20 mg by mouth daily as needed for heartburn or indigestion.   ferrous sulfate 325 (65 FE) MG tablet Take 325 mg by mouth 2 (two) times daily with a meal.    fexofenadine (ALLEGRA) 180 MG tablet Take 180 mg by mouth daily.   Fluticasone-Umeclidin-Vilant (TRELEGY ELLIPTA) 100-62.5-25 MCG/ACT AEPB Inhale 1 puff into the lungs daily.   furosemide (LASIX) 20 MG tablet TAKE 1 TABLET BY MOUTH EVERY DAY   lansoprazole (PREVACID) 30 MG capsule Take 30 mg by mouth daily.   meclizine (ANTIVERT) 25 MG tablet Take 25 mg by mouth as needed for dizziness.   metoprolol tartrate (LOPRESSOR) 25 MG tablet TAKE 1 TABLET BY MOUTH TWICE A DAY   Multiple Vitamins-Minerals (CENTRUM SILVER PO) Take by mouth.   OXYGEN Inhale 2 L into the lungs at bedtime.   Probiotic Product (ALIGN PO) Take by mouth.   rosuvastatin (CRESTOR) 10 MG tablet Take 1 tablet (10 mg total) by mouth daily.   spironolactone (ALDACTONE) 25 MG tablet Take 0.5 tablets (12.5 mg total) by mouth daily.   TRELEGY ELLIPTA 100-62.5-25 MCG/ACT AEPB INHALE 1 PUFF BY MOUTH EVERY DAY   No facility-administered encounter medications on file as of 02/21/2023.     Lab Results  Component Value Date   WBC 3.2 (L) 02/21/2023   HGB 9.0 (L) 02/21/2023   HCT 29.6 (L) 02/21/2023   PLT 178.0 02/21/2023   GLUCOSE 96 02/21/2023   CHOL 101 02/21/2023   TRIG 113.0 02/21/2023   HDL 33.80 (L) 02/21/2023   LDLDIRECT 127.1 08/25/2012   LDLCALC 44 02/21/2023    ALT 18 02/21/2023   AST 31 02/21/2023   NA 140 02/21/2023   K 4.7 02/21/2023   CL 102 02/21/2023   CREATININE 1.50 (H) 02/21/2023   BUN 22 02/21/2023   CO2 30 02/21/2023   TSH 0.91 02/21/2023   HGBA1C 6.0 02/21/2023    [place] OR NERVE BLOCK-IMAGE ONLY [facility])  Result Date: 09/24/2021 There is no interpretation for this exam.  This order is for images obtained during a surgical procedure.  Please See "Surgeries" Tab for more information regarding the procedure.   DG MINI C-ARM IMAGE ONLY  Result Date: 09/24/2021 There is no interpretation for this exam.  This order is for images obtained during a surgical procedure.  Please See "Surgeries" Tab for more information regarding the procedure.       Assessment & Plan:  Hypercholesterolemia Assessment & Plan: Continue crestor.  Low cholesterol diet and exercise.  Follow lipid panel and liver function tests.    Orders: -     Hepatic function panel -     TSH -     Lipid panel  Hyperglycemia Assessment & Plan: Low carb diet and exercise.  Follow met b and a1c.   Orders: -     Hemoglobin A1c  Essential hypertension -     Basic metabolic panel  Osteoporosis without current pathological fracture, unspecified osteoporosis type Assessment & Plan: Receiving prolia. Last injection 12/02/22.   Orders: -     VITAMIN D 25 Hydroxy (Vit-D Deficiency, Fractures)  Anemia, unspecified type Assessment & Plan: Has been evaluated by hematology previously.  Had recommended following as long as hgb stable.  Hgb remains decreased, but stable  last check - 9.7.  She desires no further w/up or evaluation at this time. Recheck cbc today.   Orders: -     CBC with Differential/Platelet -     IBC + Ferritin  Pulmonary HTN (HCC) Assessment & Plan: Continue trelegy.  Continue oxygen.  Followed by pulmonary and cardiology.  Breathing stable.    Nocturnal hypoxia Assessment & Plan: Continues with oxygen at night.  Follow.    COPD, moderate  (HCC) Assessment & Plan: She feels her breathing is stable.  Continue trelegy.  Continue nocturnal oxygen.  Follow.    Stage 3 chronic kidney disease, unspecified whether stage 3a or 3b CKD (HCC) Assessment & Plan: Has been evaluated by Dr Thedore Mins for CKD. Has been stable. Avoid antiinflammatories.  Follow renal function with lasix use.       Dale , MD

## 2023-02-22 ENCOUNTER — Telehealth: Payer: Self-pay | Admitting: *Deleted

## 2023-02-22 ENCOUNTER — Encounter: Payer: Self-pay | Admitting: Internal Medicine

## 2023-02-22 ENCOUNTER — Encounter: Payer: Self-pay | Admitting: *Deleted

## 2023-02-22 NOTE — Assessment & Plan Note (Signed)
Continue trelegy.  Continue oxygen.  Followed by pulmonary and cardiology.  Breathing stable.

## 2023-02-22 NOTE — Telephone Encounter (Signed)
Unable to reach pt. No answer & no voicemail. Sent mychart for pt to call for results.

## 2023-02-22 NOTE — Telephone Encounter (Signed)
-----   Message from [place] sent at 02/22/2023  5:18 AM EST ----- Notify - white blood cell count and hgb remain decreased, but stable from recent checks.  She has declined referral back to hematology previously.  If she changes her mind, let me know and I can place order for referral. Is she taking any iron?  If no, can start ferrous sulfate 325mg  q day.  Recheck cbc and iron studies in 6 weeks. Kidney function is stable. Cholesterol levels ok. Overall sugar control continuing to improve.  Vitamin D level, thyroid test and liver function tests wnl.

## 2023-02-22 NOTE — Assessment & Plan Note (Signed)
Continues with oxygen at night.  Follow.  

## 2023-02-22 NOTE — Assessment & Plan Note (Signed)
Receiving prolia. Last injection 12/02/22.

## 2023-02-22 NOTE — Assessment & Plan Note (Signed)
She feels her breathing is stable.  Continue trelegy.  Continue nocturnal oxygen.  Follow.  

## 2023-02-22 NOTE — Assessment & Plan Note (Signed)
Has been evaluated by hematology previously.  Had recommended following as long as hgb stable.  Hgb remains decreased, but stable last check - 9.7.  She desires no further w/up or evaluation at this time. Recheck cbc today.

## 2023-02-22 NOTE — Assessment & Plan Note (Signed)
Low carb diet and exercise.  Follow met b and a1c.   

## 2023-02-22 NOTE — Assessment & Plan Note (Signed)
Has been evaluated by Dr Singh for CKD. Has been stable. Avoid antiinflammatories.  Follow renal function with lasix use.  

## 2023-02-22 NOTE — Assessment & Plan Note (Signed)
Continue crestor.  Low cholesterol diet and exercise.  Follow lipid panel and liver function tests.  

## 2023-02-23 ENCOUNTER — Other Ambulatory Visit: Payer: Self-pay

## 2023-02-23 DIAGNOSIS — D649 Anemia, unspecified: Secondary | ICD-10-CM

## 2023-02-23 NOTE — Telephone Encounter (Signed)
Pt spoke to [place] ans scheduled for repeat labs

## 2023-02-23 NOTE — Telephone Encounter (Signed)
Patient just called back. Can you give her a call when you can to go over her lab results. Her number is [phone].

## 2023-02-28 NOTE — Progress Notes (Unsigned)
Date:  03/01/2023   ID:  Anita Ferrell, DOB Dec 06, 1932, MRN [id number]  Patient Location:  [address] [place] [address]   Provider location:   [facility] [place] office  PCP:  Dale Good Hope, MD  Cardiologist:  Hubbard Robinson [facility]   Chief Complaint  Patient presents with   12 month follow up     Patient c/o shortness of breath with over exertion. Medications reviewed by the patient verbally.     History of Present Illness:    Anita Ferrell is a 89 y.o. female past medical history of Pulmonary hypertension HTN,  COPD, smoker 89 yo quit 58 Moderate Mitral Regurg,(resolved on repeat echo) mild to moderate tricuspid valve regurgitation  chronic shortness of breath with exertion.  2015  PFT reports of an FEV1 of 1.1 L.   Echo in 08/2015: EF 35% Nonobstructive coronary disease by catheterization July 2018 Moderately elevated right heart pressures with normal PCWP of 8, by right heart catheterization , on Lasix every other day Mildly reduced LV function, LVEF approximately 45% by TEE at [facility] Ejection fraction 55 to 60% in March 2021, right heart pressures estimated to be normal Who presents for mitral valve regurgitation, shortness of breath, pulm HTN  Last seen by myself in clinic November 2023  [facility] with walker every other day down the drive Cramps in legs at times Compliant with her Lasix 20 mg daily, spironolactone 12.5 daily Denies significant lower extremity edema No significant shortness of breath  No recent falls Prior history of falls, wrist fracture, required surgery  Hobbies include reading  Lab work reviewed A1C 6.0 Total chol 101, LDL 44 CR 1.5, stable   EKG personally reviewed by myself on todays visit EKG Interpretation Date/Time:  Tuesday March 01 2023 10:00:31 EST Ventricular Rate:  89 PR Interval:  140 QRS Duration:  116 QT Interval:  402 QTC Calculation: 489 R Axis:   -10  Text  Interpretation: Sinus rhythm with occasional Premature ventricular complexes Low voltage QRS Right bundle branch block Septal infarct , age undetermined When compared with ECG of 14-Sep-2021 08:22, No significant change was found Confirmed by Julien Nordmann [phone]) on 03/01/2023 10:17:24 AM    Other past medical history reviewed  emergency room February 2020 with shortness of breath Chronic shortness of breath from COPD pulmonary hypertension, takes Lasix every other day  Echocardiogram March 2021 Ejection fraction 50% in March 2021, right heart pressures estimated to be normal No significant mitral valve regurgitation  Lab work reviewed total cholesterol 112 LDL 49 Hemoglobin A1c 6.1  Takes extra lasix for weight gain, rare  Goal weight <145 Today is 142  RHC, at [facility] in 2018,  1.  Cardiac index 2.2 L/m/m2 at rest with PCWP 8 mmHg. 2.  Moderate pulmonary hypertension in the absence of elevated PCWP, PVR 6.0 Wu. 3.  Non obstructive CAD medical therapy of moderate MR and severe COPD   Prior CV studies:    Right Ventricular systolic pressure at 60 mm Hg by echo on july of 2011; Right heart cardic catherization revealed pulmonary artery pressusre of 27/10 with a mean of; Ventricular pressure 29/10 with pulmonary capillary wedge at 9  Records from [facility] reviewed with her in detail on today's visit Cath 10/2016 1.  Cardiac index 2.2 L/m/m2 at rest with PCWP 8 mmHg. 2.  Moderate pulmonary hypertension in the absence of elevated PCWP, PVR 6.0 Wu. 3.  Non obstructive CAD Plan medical therapy  of moderate MR and severe COPD    Previously followed by Dr Romeo Apple at [facility].   ECHO - mild to moderate left ventricular dysfunction with moderate to severe MR and moderate TR.     TEE:  1. Moderate Functional MR (EROA by PISA = 0.1)   2. Moderate TR   3. Mild PR   4. No interatrial septal communication   5. Mildly reduced LV function, LVEF approximately 45%   Lab work reviewed with  creatinine 1.4    Past Medical History:  Diagnosis Date   A-fib (HCC)    Allergy    Asthma    CHF (congestive heart failure) (HCC)    COPD (chronic obstructive pulmonary disease) (HCC)    GERD (gastroesophageal reflux disease)    Hypercholesteremia    Hypertension    Mitral valve insufficiency    Pulmonary hypertension (HCC)    Right Ventricular systolic pressure at 60 mm Hg by echo on july of 2011; Right heart cardic catherization revealed pulmonary artery pressusre of 27/10 with a mean of; Ventricular pressure 29/10 with pulmonary capillary wedge at 9   TB (pulmonary tuberculosis)    treated with INH therapy    Past Surgical History:  Procedure Laterality Date   CARDIAC CATHETERIZATION  10/2016   [facility]   CATARACT EXTRACTION Bilateral 2008   CHOLECYSTECTOMY     ORIF WRIST FRACTURE Right 09/24/2021   Procedure: OPEN REDUCTION INTERNAL FIXATION OF RIGHT DISTAL RADIUS FRACTURE;  Surgeon: Kennedy Bucker, MD;  Location: [facility] ORS;  Service: Orthopedics;  Laterality: Right;   TONSILLECTOMY  1964     Current Meds  Medication Sig   acetaminophen (TYLENOL) 500 MG tablet Take 500 mg by mouth as needed.    albuterol (PROVENTIL) (2.5 MG/3ML) 0.083% nebulizer solution INHALE CONTENTS 1 VIAL VIA NEBULIZER EVERY 6 HOURS AS NEEDED WHEEZE/SHORTNESS OF BREATH J44.9   albuterol (VENTOLIN HFA) 108 (90 Base) MCG/ACT inhaler Inhale 1-2 puffs into the lungs every 6 (six) hours as needed for wheezing or shortness of breath.   aspirin EC 81 MG tablet Take 81 mg by mouth daily.   azelastine (ASTELIN) 0.1 % nasal spray Use in each nostril as directed   Calcium Carbonate-Vitamin D 600-400 MG-UNIT tablet Take 1 tablet by mouth at bedtime.   famotidine (PEPCID) 20 MG tablet Take 20 mg by mouth daily as needed for heartburn or indigestion.   ferrous sulfate 325 (65 FE) MG tablet Take 325 mg by mouth 2 (two) times daily with a meal.    fexofenadine (ALLEGRA) 180 MG tablet Take 180 mg by mouth daily.    Fluticasone-Umeclidin-Vilant (TRELEGY ELLIPTA) 100-62.5-25 MCG/ACT AEPB Inhale 1 puff into the lungs daily.   furosemide (LASIX) 20 MG tablet TAKE 1 TABLET BY MOUTH EVERY DAY   lansoprazole (PREVACID) 30 MG capsule Take 30 mg by mouth daily.   meclizine (ANTIVERT) 25 MG tablet Take 25 mg by mouth as needed for dizziness.   metoprolol tartrate (LOPRESSOR) 25 MG tablet TAKE 1 TABLET BY MOUTH TWICE A DAY   Multiple Vitamins-Minerals (CENTRUM SILVER PO) Take by mouth.   OXYGEN Inhale 2 L into the lungs at bedtime.   Probiotic Product (ALIGN PO) Take by mouth.   rosuvastatin (CRESTOR) 10 MG tablet Take 1 tablet (10 mg total) by mouth daily.   spironolactone (ALDACTONE) 25 MG tablet Take 0.5 tablets (12.5 mg total) by mouth daily.     Allergies:   Penicillin g and Penicillins   Social History   Tobacco Use  Smoking status: Former    Current packs/day: 0.00    Average packs/day: 1 pack/day for 40.0 years (40.0 ttl pk-yrs)    Types: Cigarettes    Start date: 08/17/1950    Quit date: 08/17/1990    Years since quitting: 32.5   Smokeless tobacco: Never   Tobacco comments:    Quit in 1992  Vaping Use   Vaping status: Never Used  Substance Use Topics   Alcohol use: No    Alcohol/week: 0.0 standard drinks of alcohol   Drug use: No     Family Hx: The patient's family history includes Breast cancer in her maternal aunt; Heart disease in her brother, father, and mother; Hypertension in her sister.  ROS:   Please see the history of present illness.    Review of Systems  Constitutional: Negative.   HENT: Negative.    Respiratory: Negative.    Cardiovascular: Negative.   Gastrointestinal: Negative.   Musculoskeletal: Negative.   Neurological: Negative.   Psychiatric/Behavioral: Negative.    All other systems reviewed and are negative.    Labs/Other Tests and Data Reviewed:    Recent Labs: 02/21/2023: ALT 18; BUN 22; Creatinine, Ser 1.50; Hemoglobin 9.0; Platelets 178.0; Potassium  4.7; Sodium 140; TSH 0.91   Recent Lipid Panel Lab Results  Component Value Date/Time   CHOL 101 02/21/2023 12:04 PM   TRIG 113.0 02/21/2023 12:04 PM   HDL 33.80 (L) 02/21/2023 12:04 PM   CHOLHDL 3 02/21/2023 12:04 PM   LDLCALC 44 02/21/2023 12:04 PM   LDLDIRECT 127.1 08/25/2012 11:03 AM    Wt Readings from Last 3 Encounters:  03/01/23 132 lb 8 oz (60.1 kg)  02/21/23 132 lb 12.8 oz (60.2 kg)  11/24/22 132 lb 9.6 oz (60.1 kg)     Exam:    Vital Signs: Vital signs may also be detailed in the HPI BP (!) 110/58 (BP Location: Left Arm, Patient Position: Sitting, Cuff Size: Normal)   Pulse 89   Ht 5\' 4"  (1.626 m)   Wt 132 lb 8 oz (60.1 kg)   SpO2 95%   BMI 22.74 kg/m   Constitutional:  oriented to person, place, and time. No distress.  HENT:  Head: Grossly normal Eyes:  no discharge. No scleral icterus.  Neck: No JVD, no carotid bruits  Cardiovascular: Regular rate and rhythm, no murmurs appreciated Pulmonary/Chest: Clear to auscultation bilaterally,  Rales appreciated anteriorly Abdominal: Soft.  no distension.  no tenderness.  Musculoskeletal: Normal range of motion Neurological:  normal muscle tone. Coordination normal. No atrophy Skin: Skin warm and dry Psychiatric: normal affect, pleasant   ASSESSMENT & PLAN:    Pulmonary HTN (HCC) Severe underlying lung disease Oxygen at nighttime Weight stable, continue Lasix 20 daily Renal function stable, no edema  COPD with hypoxia (HCC) Uses nebulizer, inhalers, stable  no recent COPD exacerbation Managed by pulmonary  Moderate mitral regurgitation Moderate MR previously seen on echocardiogram, appeared to resolve on echo March 2021 There was significant murmur on exam  CKD (chronic kidney disease) stage 3, GFR 30-59 ml/min (HCC) No changes made to her medications Creatinine 1.5, stable, followed by nephrology  Essential hypertension Blood pressure is well controlled on today's visit. No changes made to the  medications.  Hypercholesterolemia Cholesterol is at goal on the current lipid regimen. No changes to the medications were made.    Signed, Julien Nordmann, MD  03/01/2023 10:16 AM    [facility] [address], [place], [place]  [address]     

## 2023-03-01 ENCOUNTER — Ambulatory Visit: Payer: Medicare Other | Attending: Cardiovascular Disease | Admitting: Cardiovascular Disease

## 2023-03-01 ENCOUNTER — Encounter: Payer: Self-pay | Admitting: Cardiovascular Disease

## 2023-03-01 VITALS — BP 110/58 | HR 89 | Ht 64.0 in | Wt 132.5 lb

## 2023-03-01 DIAGNOSIS — I272 Pulmonary hypertension, unspecified: Secondary | ICD-10-CM | POA: Insufficient documentation

## 2023-03-01 DIAGNOSIS — I10 Essential (primary) hypertension: Secondary | ICD-10-CM | POA: Diagnosis not present

## 2023-03-01 DIAGNOSIS — E7800 Pure hypercholesterolemia, unspecified: Secondary | ICD-10-CM | POA: Insufficient documentation

## 2023-03-01 DIAGNOSIS — I340 Nonrheumatic mitral (valve) insufficiency: Secondary | ICD-10-CM | POA: Insufficient documentation

## 2023-03-01 DIAGNOSIS — D649 Anemia, unspecified: Secondary | ICD-10-CM | POA: Diagnosis not present

## 2023-03-01 DIAGNOSIS — I498 Other specified cardiac arrhythmias: Secondary | ICD-10-CM | POA: Insufficient documentation

## 2023-03-01 MED ORDER — SPIRONOLACTONE 25 MG PO TABS: 12.5000 mg | ORAL_TABLET | Freq: Every day | ORAL | 3 refills | Status: DC

## 2023-03-01 NOTE — Patient Instructions (Signed)

## 2023-04-06 ENCOUNTER — Other Ambulatory Visit (INDEPENDENT_AMBULATORY_CARE_PROVIDER_SITE_OTHER): Payer: Medicare Other

## 2023-04-06 DIAGNOSIS — D649 Anemia, unspecified: Secondary | ICD-10-CM

## 2023-04-06 LAB — IBC + FERRITIN
Ferritin: 22.1 ng/mL (ref 10.0–291.0)
Iron: 39 ug/dL — ABNORMAL LOW (ref 42–145)
Saturation Ratios: 11.8 % — ABNORMAL LOW (ref 20.0–50.0)
TIBC: 331.8 ug/dL (ref 250.0–450.0)
Transferrin: 237 mg/dL (ref 212.0–360.0)

## 2023-04-06 LAB — CBC WITH DIFFERENTIAL/PLATELET
Basophils Absolute: 0 10*3/uL (ref 0.0–0.1)
Basophils Relative: 0.4 % (ref 0.0–3.0)
Eosinophils Absolute: 0.1 10*3/uL (ref 0.0–0.7)
Eosinophils Relative: 3.5 % (ref 0.0–5.0)
HCT: 29 % — ABNORMAL LOW (ref 36.0–46.0)
Hemoglobin: 9.2 g/dL — ABNORMAL LOW (ref 12.0–15.0)
Lymphocytes Relative: 34.7 % (ref 12.0–46.0)
Lymphs Abs: 1.4 10*3/uL (ref 0.7–4.0)
MCHC: 31.7 g/dL (ref 30.0–36.0)
MCV: 83.3 fL (ref 78.0–100.0)
Monocytes Absolute: 0.3 10*3/uL (ref 0.1–1.0)
Monocytes Relative: 7 % (ref 3.0–12.0)
Neutro Abs: 2.2 10*3/uL (ref 1.4–7.7)
Neutrophils Relative %: 54.4 % (ref 43.0–77.0)
Platelets: 188 10*3/uL (ref 150.0–400.0)
RBC: 3.48 Mil/uL — ABNORMAL LOW (ref 3.87–5.11)
RDW: 15.7 % — ABNORMAL HIGH (ref 11.5–15.5)
WBC: 4 10*3/uL (ref 4.0–10.5)

## 2023-04-07 ENCOUNTER — Other Ambulatory Visit: Payer: Self-pay

## 2023-04-07 DIAGNOSIS — D649 Anemia, unspecified: Secondary | ICD-10-CM

## 2023-04-17 ENCOUNTER — Other Ambulatory Visit: Payer: Self-pay | Admitting: Cardiovascular Disease

## 2023-05-10 ENCOUNTER — Other Ambulatory Visit: Payer: Self-pay | Admitting: Cardiovascular Disease

## 2023-05-15 ENCOUNTER — Other Ambulatory Visit: Payer: Self-pay | Admitting: Pulmonary Disease

## 2023-05-19 ENCOUNTER — Other Ambulatory Visit (INDEPENDENT_AMBULATORY_CARE_PROVIDER_SITE_OTHER): Payer: Medicare Other

## 2023-05-19 DIAGNOSIS — D649 Anemia, unspecified: Secondary | ICD-10-CM

## 2023-05-19 LAB — CBC WITH DIFFERENTIAL/PLATELET
Basophils Absolute: 0 10*3/uL (ref 0.0–0.1)
Basophils Relative: 0.6 % (ref 0.0–3.0)
Eosinophils Absolute: 0.2 10*3/uL (ref 0.0–0.7)
Eosinophils Relative: 4.8 % (ref 0.0–5.0)
HCT: 28.9 % — ABNORMAL LOW (ref 36.0–46.0)
Hemoglobin: 9.4 g/dL — ABNORMAL LOW (ref 12.0–15.0)
Lymphocytes Relative: 34 % (ref 12.0–46.0)
Lymphs Abs: 1.2 10*3/uL (ref 0.7–4.0)
MCHC: 32.4 g/dL (ref 30.0–36.0)
MCV: 81.9 fL (ref 78.0–100.0)
Monocytes Absolute: 0.2 10*3/uL (ref 0.1–1.0)
Monocytes Relative: 7.1 % (ref 3.0–12.0)
Neutro Abs: 1.8 10*3/uL (ref 1.4–7.7)
Neutrophils Relative %: 53.5 % (ref 43.0–77.0)
Platelets: 172 10*3/uL (ref 150.0–400.0)
RBC: 3.52 Mil/uL — ABNORMAL LOW (ref 3.87–5.11)
RDW: 15.1 % (ref 11.5–15.5)
WBC: 3.4 10*3/uL — ABNORMAL LOW (ref 4.0–10.5)

## 2023-05-19 LAB — IBC + FERRITIN
Ferritin: 20.4 ng/mL (ref 10.0–291.0)
Iron: 33 ug/dL — ABNORMAL LOW (ref 42–145)
Saturation Ratios: 9.9 % — ABNORMAL LOW (ref 20.0–50.0)
TIBC: 334.6 ug/dL (ref 250.0–450.0)
Transferrin: 239 mg/dL (ref 212.0–360.0)

## 2023-05-24 ENCOUNTER — Telehealth: Payer: Self-pay

## 2023-05-24 NOTE — Telephone Encounter (Signed)
 Copied from CRM [id number]. Topic: Clinical - Lab/Test Results >> May 24, 2023 11:02 AM Russell PARAS wrote: Reason for CRM: Patient is returning call concerning lab results. Reviewed results and asked if open to hematology referral. Son has surgery coming up and other family situation to deal with and is not sure when she will have time to see hematologist. Would like to speak with Dr. Glendia about the referral at her next appt on 06/22/2023

## 2023-05-24 NOTE — Telephone Encounter (Signed)
 Noted

## 2023-06-07 DIAGNOSIS — M810 Age-related osteoporosis without current pathological fracture: Secondary | ICD-10-CM | POA: Diagnosis not present

## 2023-06-16 ENCOUNTER — Encounter: Payer: Self-pay | Admitting: Pulmonary Disease

## 2023-06-16 ENCOUNTER — Ambulatory Visit: Payer: Medicare Other | Admitting: Pulmonary Disease

## 2023-06-16 VITALS — BP 122/78 | HR 89 | Temp 96.8°F | Ht 64.0 in | Wt 132.4 lb

## 2023-06-16 DIAGNOSIS — I272 Pulmonary hypertension, unspecified: Secondary | ICD-10-CM

## 2023-06-16 DIAGNOSIS — G4736 Sleep related hypoventilation in conditions classified elsewhere: Secondary | ICD-10-CM

## 2023-06-16 DIAGNOSIS — J449 Chronic obstructive pulmonary disease, unspecified: Secondary | ICD-10-CM

## 2023-06-16 DIAGNOSIS — R0609 Other forms of dyspnea: Secondary | ICD-10-CM | POA: Diagnosis not present

## 2023-06-16 NOTE — Patient Instructions (Addendum)
 VISIT SUMMARY:  [name], today we reviewed your chronic obstructive pulmonary disease (COPD) and nocturnal hypoxemia. Your breathing has remained stable, and you are managing well with your current medications. We also discussed your occasional leg cramps and heart issues, and we have a plan to address these concerns.  YOUR PLAN:  -CHRONIC OBSTRUCTIVE PULMONARY DISEASE (COPD): COPD is a chronic lung condition that makes it hard to breathe. You will continue using Trelegy and nebulized albuterol twice daily. We discussed adding Ohtuvayre to help reduce inflammation and improve your breathing, pending approval from the specialty pharmacy. We will also schedule pulmonary function tests to assess your lung function and consider pulmonary rehab if your symptoms persist.  -NOCTURNAL HYPOXEMIA: Nocturnal hypoxemia is a condition where your oxygen levels drop during sleep. Your oxygen levels were stable during the walk test. Continue with your current management strategies, including pacing and pursed lip breathing.  -CARDIAC EVALUATION: We need to assess your heart function since your last echocardiogram was in 2021. We will order a new echocardiogram to evaluate your current heart health.  -LEG CRAMPS: You experience occasional leg cramps, which you manage with exercise and magnesium cream. Continue with these methods as they are effective for you.  INSTRUCTIONS:  Please schedule a follow-up appointment in 3 months. Before your next visit, complete the pulmonary function tests. We will also order an echocardiogram to assess your heart function.

## 2023-06-16 NOTE — Progress Notes (Signed)
 Subjective:    Patient ID: Anita Ferrell, female    DOB: 04-22-1932, 89 y.o.   MRN: [id number]  Patient Care Team: Dale De Queen, MD as PCP - General (Internal Medicine) Salena Saner, MD as Consulting Physician (Pulmonary Disease)  Chief Complaint  Patient presents with   Follow-up    DOE. No wheezing. Cough with clear.     BACKGROUND/INTERVAL:Anita Ferrell is an 89 year old remote former smoker who follows up for COPD and nocturnal hypoxemia due to chronic bronchitis.  Last seen on 24 November 2022 by me, she was noted to be doing well at that time.  She is using Trelegy Ellipta 100/25, 1 inhalation daily.  Feels she is doing well with this medication.  Rare use of albuterol.  On nocturnal oxygen at 2 L/min feels refreshed when she wakes up in the morning.  Compliant.  Notes benefit from the therapy.    HPI Discussed the use of AI scribe software for clinical note transcription with the patient, who gave verbal consent to proceed.  History of Present Illness   [name] is an 89 year old female with COPD and nocturnal hypoxemia who presents for follow-up.  She follows up for chronic obstructive pulmonary disease (COPD) and nocturnal hypoxemia. Her breathing has remained stable. She uses Trelegy without issues and a nebulizer with albuterol twice daily. Her last pulmonary function tests were conducted eight years ago at [facility], showing moderate COPD. She practices pursed lip breathing to manage symptoms.  She experiences occasional nighttime leg swelling and leg cramps. She uses magnesium cream for leg cramps, as oral magnesium affects her kidneys. She continues to exercise as part of her management plan.  She mentions having some heart problems, which are followed by Dr. Mariah Milling. Her last echocardiogram was in 2021.     DATA: CT chest 07/29/15:Significant decrease in size of the previously demonstrated right lower lobe nodule, compatible with a benign process. No  significant change in multiple additional sub cm nodules and calcified granulomata in both lungs, compatible with a benign process. Mild changes of COPD and chronic bronchitis. Resolved infection in the lingula and left lower lobe PFTs 07/29/15: moderate obstruction, normal lung volumes, mild reduction DLCO.  TEE 10/19/16: LVEF 45%. Moderate mitral regurgitation, moderate TR [facility] 10/25/16: Nonobstructive coronary disease. Moderate pulmonary hypertension (systolic 50 mmHg, mean 30 mmHg) 2D echo 07/17/2019: LVEF 55 to 60%, indeterminate diastolic parameters.  Normal pulmonary artery systolic pressure.  Tricuspid regurgitation mild to moderate.  No aortic stenosis.  Review of Systems A 10 point review of systems was performed and it is as noted above otherwise negative.   Patient Active Problem List   Diagnosis Date Noted   Nocturnal hypoxemia due to obstructive chronic bronchitis (HCC) 11/24/2022   Osteoporosis 08/19/2020   Low back pain 05/20/2020   Left hip pain 05/20/2020   Irregular heart rhythm 01/15/2020   Nocturnal hypoxia 12/18/2019   Pulmonary HTN (HCC) 12/26/2016   Moderate mitral regurgitation 10/27/2016   Bradycardia 09/23/2016   Ventricular bigeminy 09/23/2016   Diarrhea 01/13/2016   CKD (chronic kidney disease) stage 3, GFR 30-59 ml/min (HCC) 10/16/2015   Shortness of breath 03/11/2015   COPD, moderate (HCC) 03/11/2015   Environmental allergies 01/07/2015   Health care maintenance 05/29/2014   Nephrolithiasis 03/13/2014   Obesity 03/10/2014   Splenomegaly 03/05/2014   Rectal bleeding 03/05/2014   Hyperglycemia 12/30/2012   Hemorrhoids 12/30/2012   Grade I internal hemorrhoids 12/30/2012   Anemia 08/27/2012   Hypercholesterolemia 08/25/2012  Essential hypertension 05/28/2012   Asthma, chronic 05/28/2012   GERD (gastroesophageal reflux disease) 05/28/2012   Barrett's esophagus 05/28/2012    Social History   Tobacco Use   Smoking status: Former    Current  packs/day: 0.00    Average packs/day: 1 pack/day for 40.0 years (40.0 ttl pk-yrs)    Types: Cigarettes    Start date: 08/17/1950    Quit date: 08/17/1990    Years since quitting: 32.8   Smokeless tobacco: Never   Tobacco comments:    Quit in 1992  Substance Use Topics   Alcohol use: No    Alcohol/week: 0.0 standard drinks of alcohol    Allergies  Allergen Reactions   Penicillin G Swelling   Penicillins Swelling and Other (See Comments)    Has patient had a PCN reaction causing immediate rash, facial/tongue/throat swelling, SOB or lightheadedness with hypotension: Yes Has patient had a PCN reaction causing severe rash involving mucus membranes or skin necrosis: No Has patient had a PCN reaction that required hospitalization No Has patient had a PCN reaction occurring within the last 10 years: No If all of the above answers are "NO", then may proceed with Cephalosporin use.    Current Meds  Medication Sig   acetaminophen (TYLENOL) 500 MG tablet Take 500 mg by mouth as needed.    albuterol (PROVENTIL) (2.5 MG/3ML) 0.083% nebulizer solution INHALE CONTENTS 1 VIAL VIA NEBULIZER EVERY 6 HOURS AS NEEDED WHEEZE/SHORTNESS OF BREATH J44.9   albuterol (VENTOLIN HFA) 108 (90 Base) MCG/ACT inhaler Inhale 1-2 puffs into the lungs every 6 (six) hours as needed for wheezing or shortness of breath.   aspirin EC 81 MG tablet Take 81 mg by mouth daily.   Calcium Carbonate-Vitamin D 600-400 MG-UNIT tablet Take 1 tablet by mouth at bedtime.   famotidine (PEPCID) 20 MG tablet Take 20 mg by mouth daily as needed for heartburn or indigestion.   ferrous sulfate 325 (65 FE) MG tablet Take 325 mg by mouth 2 (two) times daily with a meal.    fexofenadine (ALLEGRA) 180 MG tablet Take 180 mg by mouth daily.   Fluticasone-Umeclidin-Vilant (TRELEGY ELLIPTA) 100-62.5-25 MCG/ACT AEPB INHALE 1 PUFF BY MOUTH EVERY DAY   furosemide (LASIX) 20 MG tablet TAKE 1 TABLET BY MOUTH EVERY DAY   lansoprazole (PREVACID) 30 MG  capsule Take 30 mg by mouth daily.   meclizine (ANTIVERT) 25 MG tablet Take 25 mg by mouth as needed for dizziness.   metoprolol tartrate (LOPRESSOR) 25 MG tablet TAKE 1 TABLET BY MOUTH TWICE A DAY   Multiple Vitamins-Minerals (CENTRUM SILVER PO) Take by mouth.   OXYGEN Inhale 2 L into the lungs at bedtime.   Probiotic Product (ALIGN PO) Take by mouth.   spironolactone (ALDACTONE) 25 MG tablet Take 0.5 tablets (12.5 mg total) by mouth daily.   [DISCONTINUED] azelastine (ASTELIN) 0.1 % nasal spray Use in each nostril as directed   [DISCONTINUED] rosuvastatin (CRESTOR) 10 MG tablet Take 1 tablet (10 mg total) by mouth daily.    Immunization History  Administered Date(s) Administered   Fluad Quad(high Dose 65+) 12/15/2018, 12/30/2020, 02/01/2022   Influenza Split 01/15/2020   Influenza, High Dose Seasonal PF 01/13/2016, 01/25/2017, 01/10/2018, 01/27/2023   Influenza,inj,Quad PF,6+ Mos 12/26/2012, 01/12/2014, 01/14/2015   Moderna Covid-19 Fall Seasonal Vaccine 79yrs & older 01/27/2023   Moderna Covid-19 Vaccine Bivalent Booster 35yrs & up 01/26/2021, 02/08/2022   Moderna Sars-Covid-2 Vaccination 05/02/2019, 05/30/2019, 02/14/2020, 08/11/2020   Pneumococcal Conjugate-13 09/02/2014   Pneumococcal Polysaccharide-23 11/14/2012  Tdap 11/14/2012        Objective:     BP 122/78 (BP Location: Right Arm, Cuff Size: Normal)   Pulse 89   Temp (!) 96.8 F (36 C)   Ht 5\' 4"  (1.626 m)   Wt 132 lb 6.4 oz (60.1 kg)   SpO2 93%   BMI 22.73 kg/m   SpO2: 93 % O2 Device: None (Room air)  GENERAL: Well-nourished female, spry, no acute respiratory distress, ambulatory with assistance of a walker.  Very well-groomed.  Speech is fluent. HEAD: Normocephalic, atraumatic.  EYES: Pupils equal, round, reactive to light.  No scleral icterus.  MOUTH: Oral mucosa moist.  Dentures uppers and lowers NECK: Supple. No thyromegaly. No nodules. No JVD.  Trachea midline PULMONARY: Distant breath sounds, coarse,  no other adventitious sounds CARDIOVASCULAR: S1 and S2. Regular rate and rhythm.  Grade 2/6 systolic ejection murmur consistent with mitral regurg. GASTROINTESTINAL: Nondistended abdomen.  Benign. MUSCULOSKELETAL: No joint deformity, no clubbing, no edema.  NEUROLOGIC: No overt focal deficits, speech fluent. SKIN: Intact,warm,dry.  Limited exam no rashes. PSYCH: Mood and behavior normal.      Ambulatory oxymetry was performed today:  At rest on room air oxygen saturation was 97%, the patient ambulated at a moderate pace, completed 3 laps, O2 nadir 94%, severe shortness of breath.  Resting heart rate was 94 bpm at maximum for this exercise 124 bpm.  Patient exhibited significant tachypnea and increased work of breathing during ambulatory oximetry.       Assessment & Plan:     ICD-10-CM   1. Dyspnea on exertion  R06.09 Pulmonary Function Test [facility] Only    ECHOCARDIOGRAM COMPLETE    2. COPD, moderate (HCC)  J44.9     3. Nocturnal hypoxemia due to obstructive chronic bronchitis (HCC)  J44.89    G47.36     4. Pulmonary HTN (HCC)  I27.20       Orders Placed This Encounter  Procedures   Pulmonary Function Test [facility] Only    Standing Status:   Future    Expected Date:   09/13/2023    Expiration Date:   06/15/2024    Full PFT: includes the following: basic spirometry, spirometry pre & post bronchodilator, diffusion capacity (DLCO), lung volumes:   Full PFT    This test can only be performed at:   [facility]   ECHOCARDIOGRAM COMPLETE    Patient is established with Dr. Mariah Milling    Standing Status:   Future    Expected Date:   07/14/2023    Expiration Date:   06/15/2024    Where should this test be performed:   [facility] [place]    Perflutren DEFINITY (image enhancing agent) should be administered unless hypersensitivity or allergy exist:   Administer Perflutren    Reason for exam-Echo:   Dyspnea  R06.00   Discussion:    Chronic Obstructive Pulmonary Disease (COPD) [name]  reports stable breathing. Currently on Trelegy and nebulized albuterol twice daily. Discussed air trapping and potential benefits of adding Ohtuvayre to reduce inflammation and dyspnea. Noted the high cost of Ohtuvayre but mentioned assistance programs. Oxygen levels were stable during a walk test. - Continue Trelegy - Continue nebulized albuterol twice daily - Initiate Ohtuvayre via nebulizer pending specialty pharmacy approval - Schedule pulmonary function tests - Consider pulmonary rehab if symptoms persist - Follow up in 3 months  Nocturnal Hypoxemia Ch[place]'s oxygen levels were stable during a walk test. Emphasized pacing and pursed lip breathing. - Monitor oxygen levels -  Continue current management strategies  Cardiac Evaluation [name] is followed by Dr. Mariah Milling for cardiac issues. Last echocardiogram was in 2021. New echocardiogram needed to assess current function and status of previously noted pulmonary hypertension. - Order echocardiogram  Leg Cramps [name] manages occasional leg cramps with exercise and magnesium cream. Unable to take magnesium p.o. due to renal issues. - Continue magnesium cream - Continue exercise regimen  Follow-up - Schedule follow-up in 3 months - Complete pulmonary function tests before next visit.     Advised if symptoms do not improve or worsen, to please contact office for sooner follow up or seek emergency care.    I spent 30 minutes of dedicated to the care of this patient on the date of this encounter to include pre-visit review of records, face-to-face time with the patient discussing conditions above, post visit ordering of testing, clinical documentation with the electronic health record, making appropriate referrals as documented, and communicating necessary findings to members of the patients care team.     C. Danice Goltz, MD Advanced Bronchoscopy P[facility]    *This note was generated using voice  recognition software/Dragon and/or AI transcription program.  Despite best efforts to proofread, errors can occur which can change the meaning. Any transcriptional errors that result from this process are unintentional and may not be fully corrected at the time of dictation.

## 2023-06-20 ENCOUNTER — Other Ambulatory Visit: Payer: Self-pay | Admitting: Internal Medicine

## 2023-06-22 ENCOUNTER — Ambulatory Visit (INDEPENDENT_AMBULATORY_CARE_PROVIDER_SITE_OTHER): Payer: Medicare Other | Admitting: Internal Medicine

## 2023-06-22 ENCOUNTER — Telehealth: Payer: Self-pay | Admitting: Pharmacist

## 2023-06-22 ENCOUNTER — Encounter: Payer: Self-pay | Admitting: Internal Medicine

## 2023-06-22 ENCOUNTER — Encounter: Payer: Self-pay | Admitting: Pulmonary Disease

## 2023-06-22 VITALS — BP 124/70 | HR 100 | Temp 97.5°F | Ht 64.0 in | Wt 129.4 lb

## 2023-06-22 DIAGNOSIS — G4734 Idiopathic sleep related nonobstructive alveolar hypoventilation: Secondary | ICD-10-CM | POA: Diagnosis not present

## 2023-06-22 DIAGNOSIS — I10 Essential (primary) hypertension: Secondary | ICD-10-CM

## 2023-06-22 DIAGNOSIS — Z0000 Encounter for general adult medical examination without abnormal findings: Secondary | ICD-10-CM

## 2023-06-22 DIAGNOSIS — D649 Anemia, unspecified: Secondary | ICD-10-CM

## 2023-06-22 DIAGNOSIS — Z1231 Encounter for screening mammogram for malignant neoplasm of breast: Secondary | ICD-10-CM

## 2023-06-22 DIAGNOSIS — J449 Chronic obstructive pulmonary disease, unspecified: Secondary | ICD-10-CM

## 2023-06-22 DIAGNOSIS — N183 Chronic kidney disease, stage 3 unspecified: Secondary | ICD-10-CM | POA: Diagnosis not present

## 2023-06-22 DIAGNOSIS — E7800 Pure hypercholesterolemia, unspecified: Secondary | ICD-10-CM

## 2023-06-22 DIAGNOSIS — R0602 Shortness of breath: Secondary | ICD-10-CM | POA: Diagnosis not present

## 2023-06-22 DIAGNOSIS — K2100 Gastro-esophageal reflux disease with esophagitis, without bleeding: Secondary | ICD-10-CM | POA: Diagnosis not present

## 2023-06-22 DIAGNOSIS — I272 Pulmonary hypertension, unspecified: Secondary | ICD-10-CM

## 2023-06-22 DIAGNOSIS — R739 Hyperglycemia, unspecified: Secondary | ICD-10-CM

## 2023-06-22 LAB — IBC + FERRITIN
Ferritin: 27.1 ng/mL (ref 10.0–291.0)
Iron: 38 ug/dL — ABNORMAL LOW (ref 42–145)
Saturation Ratios: 11.8 % — ABNORMAL LOW (ref 20.0–50.0)
TIBC: 323.4 ug/dL (ref 250.0–450.0)
Transferrin: 231 mg/dL (ref 212.0–360.0)

## 2023-06-22 LAB — CBC WITH DIFFERENTIAL/PLATELET
Basophils Absolute: 0 10*3/uL (ref 0.0–0.1)
Basophils Relative: 0.7 % (ref 0.0–3.0)
Eosinophils Absolute: 0.2 10*3/uL (ref 0.0–0.7)
Eosinophils Relative: 5.1 % — ABNORMAL HIGH (ref 0.0–5.0)
HCT: 30.2 % — ABNORMAL LOW (ref 36.0–46.0)
Hemoglobin: 9.4 g/dL — ABNORMAL LOW (ref 12.0–15.0)
Lymphocytes Relative: 25.6 % (ref 12.0–46.0)
Lymphs Abs: 0.9 10*3/uL (ref 0.7–4.0)
MCHC: 31.3 g/dL (ref 30.0–36.0)
MCV: 82.3 fl (ref 78.0–100.0)
Monocytes Absolute: 0.3 10*3/uL (ref 0.1–1.0)
Monocytes Relative: 7.8 % (ref 3.0–12.0)
Neutro Abs: 2.2 10*3/uL (ref 1.4–7.7)
Neutrophils Relative %: 60.8 % (ref 43.0–77.0)
Platelets: 187 10*3/uL (ref 150.0–400.0)
RBC: 3.66 Mil/uL — ABNORMAL LOW (ref 3.87–5.11)
RDW: 15.5 % (ref 11.5–15.5)
WBC: 3.5 10*3/uL — ABNORMAL LOW (ref 4.0–10.5)

## 2023-06-22 LAB — LIPID PANEL
Cholesterol: 103 mg/dL (ref 0–200)
HDL: 37.8 mg/dL — ABNORMAL LOW (ref >39.00)
LDL Cholesterol: 43 mg/dL (ref 0–99)
NonHDL: 64.92
Total CHOL/HDL Ratio: 3
Triglycerides: 108 mg/dL (ref 0.0–149.0)
VLDL: 21.6 mg/dL (ref 0.0–40.0)

## 2023-06-22 LAB — BASIC METABOLIC PANEL
BUN: 21 mg/dL (ref 6–23)
CO2: 30 meq/L (ref 19–32)
Calcium: 9.6 mg/dL (ref 8.4–10.5)
Chloride: 102 meq/L (ref 96–112)
Creatinine, Ser: 1.56 mg/dL — ABNORMAL HIGH (ref 0.40–1.20)
GFR: 29.37 mL/min — ABNORMAL LOW (ref >60.00)
Glucose, Bld: 113 mg/dL — ABNORMAL HIGH (ref 70–99)
Potassium: 4.5 meq/L (ref 3.5–5.1)
Sodium: 141 meq/L (ref 135–145)

## 2023-06-22 LAB — HEPATIC FUNCTION PANEL
ALT: 14 U/L (ref 0–35)
AST: 25 U/L (ref 0–37)
Albumin: 4 g/dL (ref 3.5–5.2)
Alkaline Phosphatase: 73 U/L (ref 39–117)
Bilirubin, Direct: 0.1 mg/dL (ref 0.0–0.3)
Total Bilirubin: 0.4 mg/dL (ref 0.2–1.2)
Total Protein: 6.6 g/dL (ref 6.0–8.3)

## 2023-06-22 LAB — HEMOGLOBIN A1C: Hgb A1c MFr Bld: 6.3 % (ref 4.6–6.5)

## 2023-06-22 MED ORDER — AZELASTINE HCL 0.1 % NA SOLN: NASAL | 5 refills | Status: AC

## 2023-06-22 MED ORDER — ROSUVASTATIN CALCIUM 10 MG PO TABS: 10.0000 mg | ORAL_TABLET | Freq: Every day | ORAL | 3 refills | Status: AC

## 2023-06-22 NOTE — Assessment & Plan Note (Signed)
Continue prevacid. 

## 2023-06-22 NOTE — Telephone Encounter (Signed)
 Received [employer] new start paperwork via fax. Completed form and faxed with clinicals and insurance card copy to [facility]   Phone#: [phone] Fax#: [phone]

## 2023-06-22 NOTE — Assessment & Plan Note (Signed)
 Low-carb diet and exercise.  Follow met b and A1c.

## 2023-06-22 NOTE — Assessment & Plan Note (Signed)
 Has been evaluated by Dr Thedore Mins. Continue to avoid antiinflammatory medication. Follow metabolic panel.

## 2023-06-22 NOTE — Assessment & Plan Note (Signed)
 Continues trelegy and oxygen at night. Planning for PFTs. Continue nebulized albuterol bid. Pulmonary discussed adding ohtuvayre. Trying to get insurance authorization.

## 2023-06-22 NOTE — Assessment & Plan Note (Signed)
 Continue aldactone and metoprolol. Follow pressures. Follow metabolic panel. No changes.

## 2023-06-22 NOTE — Assessment & Plan Note (Signed)
 Continues trelegy and oxygen at night. Known pulmonary hypertension. Planning f/u ECHO. Also planning for PFTs. Continue nebulized albuterol bid. Pulmonary discussed adding ohtuvayre. Trying to get insurance authorization.

## 2023-06-22 NOTE — Assessment & Plan Note (Addendum)
 Physical today 06/22/23.  Mammogram 03/06/21 - Birads I.  Request mammogram every other year.  Overdue. Discussed with her today. She wants to hold on mammogram at this time. Colonoscopy 04/2014.  Prolia 06/07/23.

## 2023-06-22 NOTE — Assessment & Plan Note (Addendum)
 Continue nocturnal oxygen

## 2023-06-22 NOTE — Assessment & Plan Note (Signed)
 Continue crestor.  Low cholesterol diet and exercise.  Follow lipid panel and liver function tests.

## 2023-06-22 NOTE — Progress Notes (Signed)
 Subjective:    Patient ID: Anita Ferrell, female    DOB: December 03, 1932, 89 y.o.   MRN: [id number]  Patient here for  Chief Complaint  Patient presents with   Annual Exam    HPI With past history of hypercholesterolemia, hypertension and COPD, she comes in today to follow up on these issues as well as for a complete physical exam. Continues on trelegy and oxygen at night. Is followed by cardiology - f/u pulmonary hypertension. On lasix. Saw pulmonary 06/16/23 - f/u COPD, chronic bronchitis and nocturnal hypoxemia. Did report has noticed some increased sob with exertion. Discussed with pulmonary. Recommended PFTs. Cotinue trelegy and nebulized albuterol twice daily. Discussed adding ohtuvayre. Echocardiogram ordered. Also, recommended exercise and magnesium cream for leg cramps. Followed by Dr Gershon Crane - prolia - last 06/07/23. Increased stress with son's health issues. Overall she feels she is handling things relatively well. Follow.    Past Medical History:  Diagnosis Date   A-fib (HCC)    Allergy    Asthma    CHF (congestive heart failure) (HCC)    COPD (chronic obstructive pulmonary disease) (HCC)    GERD (gastroesophageal reflux disease)    Hypercholesteremia    Hypertension    Mitral valve insufficiency    Pulmonary hypertension (HCC)    Right Ventricular systolic pressure at 60 mm Hg by echo on july of 2011; Right heart cardic catherization revealed pulmonary artery pressusre of 27/10 with a mean of; Ventricular pressure 29/10 with pulmonary capillary wedge at 9   TB (pulmonary tuberculosis)    treated with INH therapy    Past Surgical History:  Procedure Laterality Date   CARDIAC CATHETERIZATION  10/2016   [facility]   CATARACT EXTRACTION Bilateral 2008   CHOLECYSTECTOMY     ORIF WRIST FRACTURE Right 09/24/2021   Procedure: OPEN REDUCTION INTERNAL FIXATION OF RIGHT DISTAL RADIUS FRACTURE;  Surgeon: Kennedy Bucker, MD;  Location: [facility] ORS;  Service: Orthopedics;  Laterality:  Right;   TONSILLECTOMY  1964   Family History  Problem Relation Age of Onset   Heart disease Mother    Heart disease Father    Hypertension Sister    Heart disease Brother    Breast cancer Maternal Aunt    Social History   Socioeconomic History   Marital status: Widowed    Spouse name: Not on file   Number of children: 3   Years of education: Not on file   Highest education level: Not on file  Occupational History   Not on file  Tobacco Use   Smoking status: Former    Current packs/day: 0.00    Average packs/day: 1 pack/day for 40.0 years (40.0 ttl pk-yrs)    Types: Cigarettes    Start date: 08/17/1950    Quit date: 08/17/1990    Years since quitting: 32.8   Smokeless tobacco: Never   Tobacco comments:    Quit in 1992  Vaping Use   Vaping status: Never Used  Substance and Sexual Activity   Alcohol use: No    Alcohol/week: 0.0 standard drinks of alcohol   Drug use: No   Sexual activity: Not Currently  Other Topics Concern   Not on file  Social History Narrative   Not on file   Social Drivers of Health   Financial Resource Strain: Low Risk  (02/14/2018)   Overall Financial Resource Strain (CARDIA)    Difficulty of Paying Living Expenses: Not hard at all  Food Insecurity: No Food Insecurity (02/14/2018)   Hunger  Vital Sign    Worried About Programme researcher, broadcasting/film/video in the Last Year: Never true    Ran Out of Food in the Last Year: Never true  Transportation Needs: No Transportation Needs (02/14/2018)   PRAPARE - Administrator, Civil Service (Medical): No    Lack of Transportation (Non-Medical): No  Physical Activity: Unknown (02/14/2018)   Exercise Vital Sign    Days of Exercise per Week: 0 days    Minutes of Exercise per Session: Not on file  Stress: No Stress Concern Present (02/14/2018)   [employer] of Occupational Health - Occupational Stress Questionnaire    Feeling of Stress : Not at all  Social Connections: Not on file     Review of  Systems  Constitutional:  Negative for appetite change and unexpected weight change.  HENT:  Negative for sinus pressure and sore throat.        No increased congestion.   Eyes:  Negative for pain and visual disturbance.  Respiratory:  Negative for chest tightness.        SOB with exertion as outlined.   Cardiovascular:  Negative for chest pain, palpitations and leg swelling.  Gastrointestinal:  Negative for abdominal pain, diarrhea, nausea and vomiting.  Genitourinary:  Negative for difficulty urinating and dysuria.  Musculoskeletal:  Negative for joint swelling and myalgias.  Skin:  Negative for color change and rash.  Neurological:  Negative for dizziness and headaches.  Hematological:  Negative for adenopathy. Does not bruise/bleed easily.  Psychiatric/Behavioral:  Negative for agitation and dysphoric mood.        Objective:     BP 124/70   Pulse 100   Temp (!) 97.5 F (36.4 C)   Ht 5\' 4"  (1.626 m)   Wt 129 lb 6.4 oz (58.7 kg)   SpO2 95%   BMI 22.21 kg/m  Wt Readings from Last 3 Encounters:  06/22/23 129 lb 6.4 oz (58.7 kg)  06/16/23 132 lb 6.4 oz (60.1 kg)  03/01/23 132 lb 8 oz (60.1 kg)    Physical Exam Vitals reviewed.  Constitutional:      General: She is not in acute distress.    Appearance: Normal appearance. She is well-developed.  HENT:     Head: Normocephalic and atraumatic.     Right Ear: External ear normal.     Left Ear: External ear normal.  Eyes:     General: No scleral icterus.       Right eye: No discharge.        Left eye: No discharge.     Conjunctiva/sclera: Conjunctivae normal.  Neck:     Thyroid: No thyromegaly.  Cardiovascular:     Rate and Rhythm: Normal rate and regular rhythm.  Pulmonary:     Effort: No tachypnea, accessory muscle usage or respiratory distress.     Breath sounds: Normal breath sounds. No decreased breath sounds or wheezing.  Chest:  Breasts:    Right: No inverted nipple, mass, nipple discharge or tenderness (no  axillary adenopathy).     Left: No inverted nipple, mass, nipple discharge or tenderness (no axilarry adenopathy).  Abdominal:     General: Bowel sounds are normal.     Palpations: Abdomen is soft.     Tenderness: There is no abdominal tenderness.  Musculoskeletal:        General: No swelling or tenderness.     Cervical back: Neck supple.  Lymphadenopathy:     Cervical: No cervical adenopathy.  Skin:  Findings: No erythema or rash.  Neurological:     Mental Status: She is alert and oriented to person, place, and time.  Psychiatric:        Mood and Affect: Mood normal.        Behavior: Behavior normal.         Outpatient Encounter Medications as of 06/22/2023  Medication Sig   acetaminophen (TYLENOL) 500 MG tablet Take 500 mg by mouth as needed.    albuterol (PROVENTIL) (2.5 MG/3ML) 0.083% nebulizer solution INHALE CONTENTS 1 VIAL VIA NEBULIZER EVERY 6 HOURS AS NEEDED WHEEZE/SHORTNESS OF BREATH J44.9   albuterol (VENTOLIN HFA) 108 (90 Base) MCG/ACT inhaler Inhale 1-2 puffs into the lungs every 6 (six) hours as needed for wheezing or shortness of breath.   aspirin EC 81 MG tablet Take 81 mg by mouth daily.   Calcium Carbonate-Vitamin D 600-400 MG-UNIT tablet Take 1 tablet by mouth at bedtime.   famotidine (PEPCID) 20 MG tablet Take 20 mg by mouth daily as needed for heartburn or indigestion.   ferrous sulfate 325 (65 FE) MG tablet Take 325 mg by mouth 2 (two) times daily with a meal.    fexofenadine (ALLEGRA) 180 MG tablet Take 180 mg by mouth daily.   Fluticasone-Umeclidin-Vilant (TRELEGY ELLIPTA) 100-62.5-25 MCG/ACT AEPB INHALE 1 PUFF BY MOUTH EVERY DAY   furosemide (LASIX) 20 MG tablet TAKE 1 TABLET BY MOUTH EVERY DAY   lansoprazole (PREVACID) 30 MG capsule Take 30 mg by mouth daily.   meclizine (ANTIVERT) 25 MG tablet Take 25 mg by mouth as needed for dizziness.   metoprolol tartrate (LOPRESSOR) 25 MG tablet TAKE 1 TABLET BY MOUTH TWICE A DAY   Multiple Vitamins-Minerals  (CENTRUM SILVER PO) Take by mouth.   OXYGEN Inhale 2 L into the lungs at bedtime.   Probiotic Product (ALIGN PO) Take by mouth.   rosuvastatin (CRESTOR) 10 MG tablet TAKE 1 TABLET BY MOUTH EVERY DAY   spironolactone (ALDACTONE) 25 MG tablet Take 0.5 tablets (12.5 mg total) by mouth daily.   [DISCONTINUED] azelastine (ASTELIN) 0.1 % nasal spray Use in each nostril as directed   azelastine (ASTELIN) 0.1 % nasal spray Use in each nostril as directed   rosuvastatin (CRESTOR) 10 MG tablet Take 1 tablet (10 mg total) by mouth daily.   [DISCONTINUED] rosuvastatin (CRESTOR) 10 MG tablet Take 1 tablet (10 mg total) by mouth daily.   No facility-administered encounter medications on file as of 06/22/2023.     Lab Results  Component Value Date   WBC 3.5 (L) 06/22/2023   HGB 9.4 (L) 06/22/2023   HCT 30.2 (L) 06/22/2023   PLT 187.0 06/22/2023   GLUCOSE 113 (H) 06/22/2023   CHOL 103 06/22/2023   TRIG 108.0 06/22/2023   HDL 37.80 (L) 06/22/2023   LDLDIRECT 127.1 08/25/2012   LDLCALC 43 06/22/2023   ALT 14 06/22/2023   AST 25 06/22/2023   NA 141 06/22/2023   K 4.5 06/22/2023   CL 102 06/22/2023   CREATININE 1.56 (H) 06/22/2023   BUN 21 06/22/2023   CO2 30 06/22/2023   TSH 0.91 02/21/2023   HGBA1C 6.3 06/22/2023    [place] OR NERVE BLOCK-IMAGE ONLY [facility]) Result Date: 09/24/2021 There is no interpretation for this exam.  This order is for images obtained during a surgical procedure.  Please See "Surgeries" Tab for more information regarding the procedure.   DG MINI C-ARM IMAGE ONLY Result Date: 09/24/2021 There is no interpretation for this exam.  This order is for  images obtained during a surgical procedure.  Please See "Surgeries" Tab for more information regarding the procedure.       Assessment & Plan:  Encounter for screening mammogram for malignant neoplasm of breast -     3D Screening Mammogram, Left and Right; Future  Anemia, unspecified type -     CBC with Differential/Platelet -      IBC + Ferritin  Essential hypertension Assessment & Plan: Continue aldactone and metoprolol. Follow pressures. Follow metabolic panel. No changes.   Orders: -     Basic metabolic panel  Hyperglycemia Assessment & Plan: Low carb diet and exercise. Follow met b and A1c.   Orders: -     Hemoglobin A1c  Hypercholesterolemia Assessment & Plan: Continue crestor. Low cholesterol diet and exercise. Follow lipid panel and liver function tests.   Orders: -     Hepatic function panel -     Lipid panel  Health care maintenance Assessment & Plan: Physical today 06/22/23.  Mammogram 03/06/21 - Birads I.  Request mammogram every other year.  Overdue. Discussed with her today. She wants to hold on mammogram at this time. Colonoscopy 04/2014.  Prolia 06/07/23.    Pulmonary HTN (HCC) Assessment & Plan: Continues trelegy and oxygen at night. Known pulmonary hypertension. Planning f/u ECHO. Also planning for PFTs. Continue nebulized albuterol bid. Pulmonary discussed adding ohtuvayre. Trying to get insurance authorization.    Shortness of breath Assessment & Plan: Continues trelegy and oxygen at night. Known pulmonary hypertension. Planning f/u ECHO. Also planning for PFTs. Continue nebulized albuterol bid. Pulmonary discussed adding ohtuvayre. Trying to get insurance authorization.    Nocturnal hypoxia Assessment & Plan: Continue nocturnal oxygen.    Gastroesophageal reflux disease with esophagitis without hemorrhage Assessment & Plan: Continue prevacid.    COPD, moderate (HCC) Assessment & Plan: Continues trelegy and oxygen at night. Planning for PFTs. Continue nebulized albuterol bid. Pulmonary discussed adding ohtuvayre. Trying to get insurance authorization.    Stage 3 chronic kidney disease, unspecified whether stage 3a or 3b CKD (HCC) Assessment & Plan: Has been evaluated by Dr Thedore Mins. Continue to avoid antiinflammatory medication. Follow metabolic panel.    Other orders -      Rosuvastatin Calcium; Take 1 tablet (10 mg total) by mouth daily.  Dispense: 90 tablet; Refill: 3 -     Azelastine HCl; Use in each nostril as directed  Dispense: 30 mL; Refill: 5     Dale Ruskin, MD

## 2023-06-23 NOTE — Telephone Encounter (Signed)
 Received fax from [employer] with summary of benefits. Referral form for [facility] received. Rx will be triaged to [facility].. Once benefits investigation completed, pharmacy will reach out the patient to schedule shipment. If medication is unaffordable, patient will need to express financial hardship to be referred back to [place] [facility] for patient assistance program pre-screening.   Patient ID: [id number] Pharmacy phone: [phone] [place] [facility] Phone#: [phone]

## 2023-06-24 ENCOUNTER — Other Ambulatory Visit: Payer: Self-pay | Admitting: Internal Medicine

## 2023-06-24 DIAGNOSIS — D649 Anemia, unspecified: Secondary | ICD-10-CM

## 2023-06-24 NOTE — Progress Notes (Signed)
 Order placed for hematology referral.

## 2023-07-14 ENCOUNTER — Inpatient Hospital Stay: Attending: Oncology | Admitting: Oncology

## 2023-07-14 ENCOUNTER — Encounter: Payer: Self-pay | Admitting: Oncology

## 2023-07-14 ENCOUNTER — Inpatient Hospital Stay

## 2023-07-14 VITALS — BP 138/76 | HR 102 | Temp 98.8°F | Resp 18 | Wt 129.0 lb

## 2023-07-14 DIAGNOSIS — Z7982 Long term (current) use of aspirin: Secondary | ICD-10-CM | POA: Diagnosis not present

## 2023-07-14 DIAGNOSIS — I110 Hypertensive heart disease with heart failure: Secondary | ICD-10-CM

## 2023-07-14 DIAGNOSIS — I509 Heart failure, unspecified: Secondary | ICD-10-CM

## 2023-07-14 DIAGNOSIS — D509 Iron deficiency anemia, unspecified: Secondary | ICD-10-CM | POA: Diagnosis not present

## 2023-07-14 DIAGNOSIS — J449 Chronic obstructive pulmonary disease, unspecified: Secondary | ICD-10-CM

## 2023-07-14 DIAGNOSIS — Z87891 Personal history of nicotine dependence: Secondary | ICD-10-CM

## 2023-07-14 NOTE — Progress Notes (Signed)
 Patient is doing well, just an issue with her anemia, she used to come and see Dr. Irving Copas back in 2019.

## 2023-07-14 NOTE — Progress Notes (Signed)
 [facility]  [phone] [phone]  ID: Anita Ferrell OB: [birth year]  MR#: [id number]  MWN#:[id number]  Patient Care Team: Dale [place], MD as PCP - General (Internal Medicine) Salena Saner, MD as Consulting Physician (Pulmonary Disease) Jeralyn Ruths, MD as Consulting Physician (Oncology)  CHIEF COMPLAINT: Iron deficiency anemia.  INTERVAL HISTORY: Patient is an 89 year old female who was last evaluated in clinic greater than 6 years ago who is referred back for persistent anemia.  She was also noted to have decreased iron stores.  She currently feels well and is asymptomatic.  He has no neurologic complaints.  She denies any recent fevers or illnesses.  She does not complain of any weakness or fatigue.  She has good appetite and denies weight loss.  She has no chest pain, shortness of breath, cough, or hemoptysis.  She denies any nausea, vomiting, constipation, or diarrhea.  She has no melena or hematochezia.  She has no urinary complaints.  Patient offers no specific complaints today.  REVIEW OF SYSTEMS:   Review of Systems  Constitutional: Negative.  Negative for fever, malaise/fatigue and weight loss.  Respiratory: Negative.  Negative for cough, hemoptysis and shortness of breath.   Cardiovascular: Negative.  Negative for chest pain and leg swelling.  Gastrointestinal: Negative.  Negative for abdominal pain, blood in stool and melena.  Genitourinary: Negative.  Negative for hematuria.  Musculoskeletal: Negative.  Negative for back pain.  Skin: Negative.  Negative for rash.  Neurological: Negative.  Negative for dizziness, focal weakness, weakness and headaches.  Psychiatric/Behavioral: Negative.  The patient is not nervous/anxious.     As per HPI. Otherwise, a complete review of systems is negative.  PAST MEDICAL HISTORY: Past Medical History:  Diagnosis Date   A-fib (HCC)    Allergy    Asthma    CHF (congestive  heart failure) (HCC)    COPD (chronic obstructive pulmonary disease) (HCC)    GERD (gastroesophageal reflux disease)    Hypercholesteremia    Hypertension    Mitral valve insufficiency    Pulmonary hypertension (HCC)    Right Ventricular systolic pressure at 60 mm Hg by echo on july of 2011; Right heart cardic catherization revealed pulmonary artery pressusre of 27/10 with a mean of; Ventricular pressure 29/10 with pulmonary capillary wedge at 9   TB (pulmonary tuberculosis)    treated with INH therapy     PAST SURGICAL HISTORY: Past Surgical History:  Procedure Laterality Date   CARDIAC CATHETERIZATION  10/2016   [facility]   CATARACT EXTRACTION Bilateral 2008   CHOLECYSTECTOMY     ORIF WRIST FRACTURE Right 09/24/2021   Procedure: OPEN REDUCTION INTERNAL FIXATION OF RIGHT DISTAL RADIUS FRACTURE;  Surgeon: Kennedy Bucker, MD;  Location: [facility] ORS;  Service: Orthopedics;  Laterality: Right;   TONSILLECTOMY  1964    FAMILY HISTORY: Family History  Problem Relation Age of Onset   Heart disease Mother    Heart disease Father    Hypertension Sister    Heart disease Brother    Breast cancer Maternal Aunt     ADVANCED DIRECTIVES (Y/N):  N  HEALTH MAINTENANCE: Social History   Tobacco Use   Smoking status: Former    Current packs/day: 0.00    Average packs/day: 1 pack/day for 40.0 years (40.0 ttl pk-yrs)    Types: Cigarettes    Start date: 08/17/1950    Quit date: 08/17/1990    Years since quitting: 32.9   Smokeless tobacco: Never   Tobacco comments:  Quit in 1992  Vaping Use   Vaping status: Never Used  Substance Use Topics   Alcohol use: No    Alcohol/week: 0.0 standard drinks of alcohol   Drug use: No     Colonoscopy:  PAP:  Bone density:  Lipid panel:  Allergies  Allergen Reactions   Penicillin G Swelling   Penicillins Swelling and Other (See Comments)    Has patient had a PCN reaction causing immediate rash, facial/tongue/throat swelling, SOB or lightheadedness  with hypotension: Yes Has patient had a PCN reaction causing severe rash involving mucus membranes or skin necrosis: No Has patient had a PCN reaction that required hospitalization No Has patient had a PCN reaction occurring within the last 10 years: No If all of the above answers are "NO", then may proceed with Cephalosporin use.    Current Outpatient Medications  Medication Sig Dispense Refill   albuterol (VENTOLIN HFA) 108 (90 Base) MCG/ACT inhaler Inhale 1-2 puffs into the lungs every 6 (six) hours as needed for wheezing or shortness of breath. 18 g 5   acetaminophen (TYLENOL) 500 MG tablet Take 500 mg by mouth as needed.      albuterol (PROVENTIL) (2.5 MG/3ML) 0.083% nebulizer solution INHALE CONTENTS 1 VIAL VIA NEBULIZER EVERY 6 HOURS AS NEEDED WHEEZE/SHORTNESS OF BREATH J44.9 375 mL 5   aspirin EC 81 MG tablet Take 81 mg by mouth daily.     azelastine (ASTELIN) 0.1 % nasal spray Use in each nostril as directed 30 mL 5   Calcium Carbonate-Vitamin D 600-400 MG-UNIT tablet Take 1 tablet by mouth at bedtime.     famotidine (PEPCID) 20 MG tablet Take 20 mg by mouth daily as needed for heartburn or indigestion.     ferrous sulfate 325 (65 FE) MG tablet Take 325 mg by mouth 2 (two) times daily with a meal.      fexofenadine (ALLEGRA) 180 MG tablet Take 180 mg by mouth daily.     Fluticasone-Umeclidin-Vilant (TRELEGY ELLIPTA) 100-62.5-25 MCG/ACT AEPB INHALE 1 PUFF BY MOUTH EVERY DAY 60 each 11   furosemide (LASIX) 20 MG tablet TAKE 1 TABLET BY MOUTH EVERY DAY 90 tablet 3   lansoprazole (PREVACID) 30 MG capsule Take 30 mg by mouth daily.     meclizine (ANTIVERT) 25 MG tablet Take 25 mg by mouth as needed for dizziness.     metoprolol tartrate (LOPRESSOR) 25 MG tablet TAKE 1 TABLET BY MOUTH TWICE A DAY 180 tablet 0   Multiple Vitamins-Minerals (CENTRUM SILVER PO) Take by mouth.     OXYGEN Inhale 2 L into the lungs at bedtime.     Probiotic Product (ALIGN PO) Take by mouth.     rosuvastatin  (CRESTOR) 10 MG tablet TAKE 1 TABLET BY MOUTH EVERY DAY 90 tablet 3   rosuvastatin (CRESTOR) 10 MG tablet Take 1 tablet (10 mg total) by mouth daily. 90 tablet 3   spironolactone (ALDACTONE) 25 MG tablet Take 0.5 tablets (12.5 mg total) by mouth daily. 45 tablet 3   No current facility-administered medications for this visit.    OBJECTIVE: Vitals:   07/14/23 1336  BP: 138/76  Pulse: (!) 102  Resp: 18  Temp: 98.8 F (37.1 C)  SpO2: 100%     Body mass index is 22.14 kg/m.    ECOG FS:0 - Asymptomatic  General: Well-developed, well-nourished, no acute distress. Eyes: Pink conjunctiva, anicteric sclera. HEENT: Normocephalic, moist mucous membranes. Lungs: No audible wheezing or coughing. Heart: Regular rate and rhythm. Abdomen: Soft, nontender, no  obvious distention. Musculoskeletal: No edema, cyanosis, or clubbing. Neuro: Alert, answering all questions appropriately. Cranial nerves grossly intact. Skin: No rashes or petechiae noted. Psych: Normal affect. Lymphatics: No cervical, calvicular, axillary or inguinal LAD.   LAB RESULTS:  Lab Results  Component Value Date   NA 141 06/22/2023   K 4.5 06/22/2023   CL 102 06/22/2023   CO2 30 06/22/2023   GLUCOSE 113 (H) 06/22/2023   BUN 21 06/22/2023   CREATININE 1.56 (H) 06/22/2023   CALCIUM 9.6 06/22/2023   PROT 6.6 06/22/2023   ALBUMIN 4.0 06/22/2023   AST 25 06/22/2023   ALT 14 06/22/2023   ALKPHOS 73 06/22/2023   BILITOT 0.4 06/22/2023   GFRNONAA 36 (L) 09/14/2021   GFRAA 37 (L) 05/27/2018    Lab Results  Component Value Date   WBC 3.5 (L) 06/22/2023   NEUTROABS 2.2 06/22/2023   HGB 9.4 (L) 06/22/2023   HCT 30.2 (L) 06/22/2023   MCV 82.3 06/22/2023   PLT 187.0 06/22/2023   Lab Results  Component Value Date   IRON 38 (L) 06/22/2023   TIBC 323.4 06/22/2023   IRONPCTSAT 11.8 (L) 06/22/2023   Lab Results  Component Value Date   FERRITIN 27.1 06/22/2023     STUDIES: No results found.  ASSESSMENT: Iron  deficiency anemia.  PLAN:    Iron deficiency anemia: Patient noted to have a longstanding history of mild anemia, but recent laboratory work noted declining hemoglobin as well as reduced iron stores.  Her last colonoscopy was in 2016.  Patient takes oral iron supplementation and is tolerating it well, but has seen no appreciable change in her iron stores.  She would benefit from IV Venofer.  Return to clinic 5 times over the next 1 to 2 weeks to receive 200 mg IV Venofer.  Patient will then return to clinic in 4 months with repeat laboratory work, further evaluation, and consideration of additional treatment if necessary.  I spent a total of 45 minutes reviewing chart data, face-to-face evaluation with the patient, counseling and coordination of care as detailed above.   Patient expressed understanding and was in agreement with this plan. She also understands that She can call clinic at any time with any questions, concerns, or complaints.    Jeralyn Ruths, MD   07/14/2023 2:09 PM

## 2023-07-20 ENCOUNTER — Inpatient Hospital Stay: Payer: PRIVATE HEALTH INSURANCE | Attending: Oncology

## 2023-07-20 VITALS — BP 123/54 | HR 82 | Temp 97.0°F | Resp 18

## 2023-07-20 DIAGNOSIS — D509 Iron deficiency anemia, unspecified: Secondary | ICD-10-CM | POA: Diagnosis not present

## 2023-07-20 MED ORDER — IRON SUCROSE 20 MG/ML IV SOLN
200.0000 mg | Freq: Once | INTRAVENOUS | Status: AC
  Administered 2023-07-20: 200 mg via INTRAVENOUS
  Filled 2023-07-20: qty 10

## 2023-07-20 NOTE — Patient Instructions (Signed)

## 2023-07-26 ENCOUNTER — Inpatient Hospital Stay: Payer: PRIVATE HEALTH INSURANCE

## 2023-07-26 VITALS — BP 108/52 | HR 77 | Temp 96.0°F | Resp 18

## 2023-07-26 DIAGNOSIS — D509 Iron deficiency anemia, unspecified: Secondary | ICD-10-CM

## 2023-07-26 MED ORDER — IRON SUCROSE 20 MG/ML IV SOLN
200.0000 mg | Freq: Once | INTRAVENOUS | Status: AC
  Administered 2023-07-26: 200 mg via INTRAVENOUS

## 2023-07-26 MED ORDER — SODIUM CHLORIDE 0.9% FLUSH
10.0000 mL | Freq: Once | INTRAVENOUS | Status: AC | PRN
  Administered 2023-07-26: 10 mL
  Filled 2023-07-26: qty 10

## 2023-07-29 ENCOUNTER — Inpatient Hospital Stay: Payer: PRIVATE HEALTH INSURANCE

## 2023-07-29 VITALS — BP 121/60 | HR 85 | Temp 96.0°F | Resp 18

## 2023-07-29 DIAGNOSIS — D509 Iron deficiency anemia, unspecified: Secondary | ICD-10-CM

## 2023-07-29 MED ORDER — IRON SUCROSE 20 MG/ML IV SOLN
200.0000 mg | Freq: Once | INTRAVENOUS | Status: AC
Start: 2023-07-29 — End: 2023-07-29
  Administered 2023-07-29: 200 mg via INTRAVENOUS
  Filled 2023-07-29: qty 10

## 2023-07-29 MED ORDER — SODIUM CHLORIDE 0.9% FLUSH
10.0000 mL | Freq: Once | INTRAVENOUS | Status: AC | PRN
  Administered 2023-07-29: 10 mL
  Filled 2023-07-29: qty 10

## 2023-07-29 NOTE — Patient Instructions (Signed)

## 2023-07-29 NOTE — Progress Notes (Signed)
Patient tolerated Venofer infusion well. Explained recommendation of 30 min post monitoring. Patient refused to wait post monitoring. Educated on what signs to watch for & to call with any concerns. No questions, discharged. Stable  

## 2023-08-01 ENCOUNTER — Inpatient Hospital Stay: Payer: PRIVATE HEALTH INSURANCE

## 2023-08-01 VITALS — BP 123/76 | HR 88 | Temp 96.3°F | Resp 18

## 2023-08-01 DIAGNOSIS — D509 Iron deficiency anemia, unspecified: Secondary | ICD-10-CM | POA: Diagnosis not present

## 2023-08-01 MED ORDER — IRON SUCROSE 20 MG/ML IV SOLN
200.0000 mg | Freq: Once | INTRAVENOUS | Status: AC
  Administered 2023-08-01: 200 mg via INTRAVENOUS

## 2023-08-05 ENCOUNTER — Inpatient Hospital Stay: Payer: PRIVATE HEALTH INSURANCE

## 2023-08-05 VITALS — BP 135/65 | HR 92 | Temp 97.2°F | Resp 14 | Wt 127.8 lb

## 2023-08-05 DIAGNOSIS — D509 Iron deficiency anemia, unspecified: Secondary | ICD-10-CM

## 2023-08-05 MED ORDER — IRON SUCROSE 20 MG/ML IV SOLN
200.0000 mg | Freq: Once | INTRAVENOUS | Status: AC
  Administered 2023-08-05: 200 mg via INTRAVENOUS
  Filled 2023-08-05: qty 10

## 2023-08-05 MED ORDER — SODIUM CHLORIDE 0.9% FLUSH
10.0000 mL | Freq: Once | INTRAVENOUS | Status: AC | PRN
  Administered 2023-08-05: 10 mL
  Filled 2023-08-05: qty 10

## 2023-08-05 NOTE — Progress Notes (Signed)
Patient tolerated Venofer infusion well. Explained recommendation of 30 min post monitoring. Patient refused to wait post monitoring. Educated on what signs to watch for & to call with any concerns. No questions, discharged. Stable  

## 2023-08-05 NOTE — Patient Instructions (Signed)

## 2023-08-07 ENCOUNTER — Other Ambulatory Visit: Payer: Self-pay | Admitting: Cardiovascular Disease

## 2023-08-18 ENCOUNTER — Other Ambulatory Visit: Payer: Self-pay | Admitting: Pulmonary Disease

## 2023-09-05 ENCOUNTER — Other Ambulatory Visit: Payer: Self-pay

## 2023-09-05 DIAGNOSIS — R0609 Other forms of dyspnea: Secondary | ICD-10-CM

## 2023-09-05 DIAGNOSIS — J449 Chronic obstructive pulmonary disease, unspecified: Secondary | ICD-10-CM

## 2023-09-08 ENCOUNTER — Ambulatory Visit: Admitting: Pulmonary Disease

## 2023-09-08 DIAGNOSIS — R0609 Other forms of dyspnea: Secondary | ICD-10-CM | POA: Diagnosis not present

## 2023-09-08 DIAGNOSIS — J449 Chronic obstructive pulmonary disease, unspecified: Secondary | ICD-10-CM

## 2023-09-08 LAB — PULMONARY FUNCTION TEST
DL/VA % pred: 55 %
DL/VA: 2.28 ml/min/mmHg/L
DLCO unc % pred: 33 %
DLCO unc: 6.09 ml/min/mmHg
FEF 25-75 Pre: 0.38 L/s
FEF2575-%Pred-Pre: 40 %
FEV1-%Pred-Pre: 58 %
FEV1-Pre: 0.95 L
FEV1FVC-%Pred-Pre: 65 %
FEV6-%Pred-Pre: 95 %
FEV6-Pre: 1.96 L
FEV6FVC-%Pred-Pre: 103 %
FVC-%Pred-Pre: 92 %
FVC-Pre: 2.03 L
Pre FEV1/FVC ratio: 47 %
Pre FEV6/FVC Ratio: 97 %
RV % pred: 208 %
RV: 5.37 L
TLC % pred: 140 %
TLC: 7.08 L

## 2023-09-08 NOTE — Patient Instructions (Signed)
 Full PFT completed without post.

## 2023-09-08 NOTE — Progress Notes (Signed)
 Full PFT completed w/o post due to pt taking albuterol  right before pft.

## 2023-09-21 ENCOUNTER — Ambulatory Visit: Admitting: Pulmonary Disease

## 2023-09-21 ENCOUNTER — Encounter: Payer: Self-pay | Admitting: Pulmonary Disease

## 2023-09-21 VITALS — BP 130/60 | HR 84 | Temp 97.6°F | Ht 64.0 in | Wt 129.6 lb

## 2023-09-21 DIAGNOSIS — J449 Chronic obstructive pulmonary disease, unspecified: Secondary | ICD-10-CM | POA: Diagnosis not present

## 2023-09-21 DIAGNOSIS — J4489 Other specified chronic obstructive pulmonary disease: Secondary | ICD-10-CM | POA: Diagnosis not present

## 2023-09-21 DIAGNOSIS — R0609 Other forms of dyspnea: Secondary | ICD-10-CM

## 2023-09-21 DIAGNOSIS — G4736 Sleep related hypoventilation in conditions classified elsewhere: Secondary | ICD-10-CM

## 2023-09-21 NOTE — Patient Instructions (Addendum)
 VISIT SUMMARY:  You had a follow-up appointment to discuss your severe COPD and iron  deficiency anemia. Your breathing has slightly declined compared to last year, but it remains stable with your current treatment. You have been receiving iron  infusions, which have provided some improvement, though your iron  levels continue to decrease.  YOUR PLAN:  -SEVERE COPD: Chronic Obstructive Pulmonary Disease (COPD) is a long-term lung condition that makes it hard to breathe. Your breathing is stable with the use of oxygen  at night and your current inhaler regimen. You should continue using nocturnal oxygen  therapy, maintain your inhaler regimen with Trelegy, Ohtuvayre  and as-needed albuterol , and stay active with daily walks and light household activities.  -IRON  DEFICIENCY ANEMIA: Iron  deficiency anemia is a condition where your body lacks enough iron  to produce healthy red blood cells, which can affect your breathing. You have shown partial improvement with five iron  infusions, but your iron  levels are still decreasing. You should continue to follow up with your hematologist for further iron  infusions.  INSTRUCTIONS:  Please continue your nocturnal oxygen  therapy and inhaler/nebulizers regimen. Stay active with daily walks and light household activities. Follow up with your hematologist for further iron  infusions.  Will see you in follow-up in 4 months time call sooner should any new problems arise.

## 2023-09-21 NOTE — Progress Notes (Signed)
 Subjective:    Patient ID: Anita  F Ferrell, female    DOB: October 17, 1932, 89 y.o.   MRN: [id number]  Patient Care Team: Dellar Fenton, MD as PCP - General (Internal Medicine) Marc Senior, MD as Consulting Physician (Pulmonary Disease) Shellie Dials, MD as Consulting Physician (Oncology)  Chief Complaint  Patient presents with   Follow-up    Cough with clear phlegm. Shortness of breath on exertion. Wheezing.     BACKGROUND/INTERVAL:Anita Ferrell  is an 89 year old remote former smoker who follows up for COPD and nocturnal hypoxemia due to chronic bronchitis.  Last seen on 16 June 2023 by me, she was started on Ohtuvayre  at that time.  HPI Discussed the use of AI scribe software for clinical note transcription with the patient, who gave verbal consent to proceed.  History of Present Illness   Anita  F Ferrell is an 89 year old female with severe COPD who presents for follow-up. She was referred by Dr. Geralyn Knee to a hematologist for evaluation of her iron  levels.  She has been getting iron  infusions for severe iron  deficiency anemia.  Her breathing is stable but has slightly declined compared to PFTs from 2017. She uses oxygen  at night, which remains effective, and uses an inhaler without issues. She experiences some coughing, primarily from the sinuses, which she attributes to allergies since moving to [place]. She coughs up some mucus from the lungs, but not as much as from the sinuses.  She has been receiving iron  infusions, having completed five so far, which have provided some improvement, though her iron  levels continue to decrease.  She is on a regimen of Trelegy, Ohtuvayre  and as-needed albuterol , she takes Ohtuvayre  twice a day. She maintains an active lifestyle, engaging in daily walks and light household activities, which she finds beneficial.     DATA: CT chest 07/29/15:Significant decrease in size of the previously demonstrated right lower lobe nodule, compatible  with a benign process. No significant change in multiple additional sub cm nodules and calcified granulomata in both lungs, compatible with a benign process. Mild changes of COPD and chronic bronchitis. Resolved infection in the lingula and left lower lobe PFTs 07/29/15: moderate obstruction, normal lung volumes, mild reduction DLCO.  TEE 10/19/16: LVEF 45%. Moderate mitral regurgitation, moderate TR [facility] 10/25/16: Nonobstructive coronary disease. Moderate pulmonary hypertension (systolic 50 mmHg, mean 30 mmHg) 2D echo 07/17/2019: LVEF 55 to 60%, indeterminate diastolic parameters.  Normal pulmonary artery systolic pressure. Tricuspid regurgitation mild to moderate.  No aortic stenosis. PFTs 09/08/2023: FEV1 0.95 L or 58% predicted FVC 2.03 L or 92% predicted, FEV1/FVC 57%.  No bronchodilator test done as the patient had just used bronchodilator (albuterol ) prior to his testing.  There is significant hyperinflation and air trapping diffusion capacity is severely impaired.  Compared to study performed in 2017 there has been significant decline in FEV1 and FVC indicating progression of disease.   Review of Systems A 10 point review of systems was performed and it is as noted above otherwise negative.   Patient Active Problem List   Diagnosis Date Noted   Nocturnal hypoxemia due to obstructive chronic bronchitis (HCC) 11/24/2022   Osteoporosis 08/19/2020   Low back pain 05/20/2020   Left hip pain 05/20/2020   Irregular heart rhythm 01/15/2020   Nocturnal hypoxia 12/18/2019   Pulmonary HTN (HCC) 12/26/2016   Moderate mitral regurgitation 10/27/2016   Bradycardia 09/23/2016   Ventricular bigeminy 09/23/2016   Diarrhea 01/13/2016   CKD (chronic kidney disease) stage 3, GFR 30-59 ml/min (HCC)  10/16/2015   Shortness of breath 03/11/2015   COPD, moderate (HCC) 03/11/2015   Environmental allergies 01/07/2015   Health care maintenance 05/29/2014   Nephrolithiasis 03/13/2014   Obesity 03/10/2014    Splenomegaly 03/05/2014   Rectal bleeding 03/05/2014   Hyperglycemia 12/30/2012   Hemorrhoids 12/30/2012   Grade I internal hemorrhoids 12/30/2012   Anemia 08/27/2012   Hypercholesterolemia 08/25/2012   Essential hypertension 05/28/2012   Asthma, chronic 05/28/2012   GERD (gastroesophageal reflux disease) 05/28/2012   Barrett's esophagus 05/28/2012    Social History   Tobacco Use   Smoking status: Former    Current packs/day: 0.00    Average packs/day: 1 pack/day for 40.0 years (40.0 ttl pk-yrs)    Types: Cigarettes    Start date: 08/17/1950    Quit date: 08/17/1990    Years since quitting: 33.1   Smokeless tobacco: Never   Tobacco comments:    Quit in 1992  Substance Use Topics   Alcohol use: No    Alcohol/week: 0.0 standard drinks of alcohol    Allergies  Allergen Reactions   Penicillin G Swelling   Penicillins Swelling and Other (See Comments)    Has patient had a PCN reaction causing immediate rash, facial/tongue/throat swelling, SOB or lightheadedness with hypotension: Yes Has patient had a PCN reaction causing severe rash involving mucus membranes or skin necrosis: No Has patient had a PCN reaction that required hospitalization No Has patient had a PCN reaction occurring within the last 10 years: No If all of the above answers are "NO", then may proceed with Cephalosporin use.    Current Meds  Medication Sig   acetaminophen  (TYLENOL ) 500 MG tablet Take 500 mg by mouth as needed.    albuterol  (PROVENTIL ) (2.5 MG/3ML) 0.083% nebulizer solution INHALE CONTENTS 1 VIAL VIA NEBULIZER EVERY 6 HOURS AS NEEDED WHEEZE/SHORTNESS OF BREATH J44.9   albuterol  (VENTOLIN  HFA) 108 (90 Base) MCG/ACT inhaler Inhale 1-2 puffs into the lungs every 6 (six) hours as needed for wheezing or shortness of breath.   aspirin  EC 81 MG tablet Take 81 mg by mouth daily.   azelastine  (ASTELIN ) 0.1 % nasal spray Use in each nostril as directed   Calcium  Carbonate-Vitamin D  600-400 MG-UNIT tablet  Take 1 tablet by mouth at bedtime.   Ensifentrine  (OHTUVAYRE ) 3 MG/2.5ML SUSP Inhale into the lungs 2 (two) times daily.   famotidine (PEPCID) 20 MG tablet Take 20 mg by mouth daily as needed for heartburn or indigestion.   ferrous sulfate  325 (65 FE) MG tablet Take 325 mg by mouth 2 (two) times daily with a meal.    fexofenadine (ALLEGRA) 180 MG tablet Take 180 mg by mouth daily.   Fluticasone -Umeclidin-Vilant (TRELEGY ELLIPTA ) 100-62.5-25 MCG/ACT AEPB INHALE 1 PUFF BY MOUTH EVERY DAY   furosemide  (LASIX ) 20 MG tablet TAKE 1 TABLET BY MOUTH EVERY DAY   lansoprazole (PREVACID) 30 MG capsule Take 30 mg by mouth daily.   meclizine (ANTIVERT) 25 MG tablet Take 25 mg by mouth as needed for dizziness.   metoprolol  tartrate (LOPRESSOR ) 25 MG tablet TAKE 1 TABLET BY MOUTH TWICE A DAY   Multiple Vitamins-Minerals (CENTRUM SILVER PO) Take by mouth.   OXYGEN  Inhale 2 L into the lungs at bedtime.   Probiotic Product (ALIGN PO) Take by mouth.   rosuvastatin  (CRESTOR ) 10 MG tablet TAKE 1 TABLET BY MOUTH EVERY DAY   rosuvastatin  (CRESTOR ) 10 MG tablet Take 1 tablet (10 mg total) by mouth daily.   spironolactone  (ALDACTONE ) 25 MG tablet Take 0.5  tablets (12.5 mg total) by mouth daily.    Immunization History  Administered Date(s) Administered   Fluad Quad(high Dose 65+) 12/15/2018, 12/30/2020, 02/01/2022   Influenza Split 01/15/2020   Influenza, High Dose Seasonal PF 01/13/2016, 01/25/2017, 01/10/2018, 01/27/2023   Influenza,inj,Quad PF,6+ Mos 12/26/2012, 01/12/2014, 01/14/2015   Moderna Covid-19 Fall Seasonal Vaccine 57yrs & older 01/27/2023   Moderna Covid-19 Vaccine  Bivalent Booster 39yrs & up 01/26/2021, 02/08/2022   Moderna Sars-Covid-2 Vaccination 05/02/2019, 05/30/2019, 02/14/2020, 08/11/2020   Pneumococcal Conjugate-13 09/02/2014   Pneumococcal Polysaccharide-23 11/14/2012   Tdap 11/14/2012        Objective:     BP 130/60 (BP Location: Right Arm, Patient Position: Sitting, Cuff Size:  Normal)   Pulse 84   Temp 97.6 F (36.4 C) (Oral)   Ht 5\' 4"  (1.626 m)   Wt 129 lb 9.6 oz (58.8 kg)   SpO2 97%   BMI 22.25 kg/m   SpO2: 97 %  GENERAL: Well-nourished female, spry, no acute respiratory distress, ambulatory with assistance of a walker.  Very well-groomed.  Speech is fluent. HEAD: Normocephalic, atraumatic.  EYES: Pupils equal, round, reactive to light.  No scleral icterus.  MOUTH: Oral mucosa moist.  Dentures uppers and lowers NECK: Supple. No thyromegaly. No nodules. No JVD.  Trachea midline PULMONARY: Distant breath sounds, coarse, no other adventitious sounds CARDIOVASCULAR: S1 and S2. Regular rate and rhythm.  Grade 2/6 systolic ejection murmur consistent with mitral regurg. GASTROINTESTINAL: Nondistended abdomen.  Benign. MUSCULOSKELETAL: No joint deformity, no clubbing, no edema.  NEUROLOGIC: No overt focal deficits, speech fluent. SKIN: Intact,warm,dry.  Limited exam no rashes. PSYCH: Mood and behavior normal.   Ambulatory oxymetry was performed today:  At rest on room air oxygen  saturation was 99%, the patient ambulated at a normal/steady pace, completed 3 laps, O2 nadir 98%, mild shortness of breath.  Resting heart rate was 81 bpm at maximum for this exercise 114 bpm.   Assessment & Plan:     ICD-10-CM   1. Dyspnea on exertion  R06.09     2. Stage 3 severe COPD by GOLD classification (HCC)  J44.9 Pulmonary Function Test [facility] Only    3. Nocturnal hypoxemia due to obstructive chronic bronchitis (HCC)  J44.89    G47.36      Discussion:    Severe COPD Severe COPD with a slight decline in pulmonary function since 2017, consistent with disease progression. Breathing is well-managed with nocturnal oxygen  use and current inhaler regimen. No significant pulmonary congestion or mucus production. Oxygen  levels are maintained during walk test. Sinus-related coughing likely due to allergies. - Continue nocturnal oxygen  therapy. - Maintain current inhaler regimen  with Trelegy and as-needed albuterol . - Continue Ohtuvayre  twice a day via nebulizer. - Encourage physical activity and pacing of activities.   Iron  deficiency anemia Iron  deficiency anemia with partial improvement following five iron  infusions. Iron  levels continue to decrease, potentially affecting breathing due to reduced oxygen -carrying capacity. - Continue follow-up with hematologist for iron  infusions.      Advised if symptoms do not improve or worsen, to please contact office for sooner follow up or seek emergency care.    I spent 40 minutes of dedicated to the care of this patient on the date of this encounter to include pre-visit review of records, face-to-face time with the patient discussing conditions above, post visit ordering of testing, clinical documentation with the electronic health record, making appropriate referrals as documented, and communicating necessary findings to members of the patients care team.  Acey Ace, MD Advanced Bronchoscopy P[facility]    *This note was generated using voice recognition software/Dragon and/or AI transcription program.  Despite best efforts to proofread, errors can occur which can change the meaning. Any transcriptional errors that result from this process are unintentional and may not be fully corrected at the time of dictation.

## 2023-10-02 ENCOUNTER — Encounter: Payer: Self-pay | Admitting: Internal Medicine

## 2023-10-03 ENCOUNTER — Other Ambulatory Visit: Payer: Self-pay

## 2023-10-03 DIAGNOSIS — R300 Dysuria: Secondary | ICD-10-CM

## 2023-10-03 NOTE — Telephone Encounter (Signed)
 Called and discuss with pt and son. Son is going to come today to pick up urine cup and bring back in the morning. Patient has been scheduled for a virtual with Lorice Roof tomorrow PM.

## 2023-10-04 ENCOUNTER — Telehealth (INDEPENDENT_AMBULATORY_CARE_PROVIDER_SITE_OTHER): Admitting: Nurse Practitioner

## 2023-10-04 ENCOUNTER — Other Ambulatory Visit (INDEPENDENT_AMBULATORY_CARE_PROVIDER_SITE_OTHER)

## 2023-10-04 ENCOUNTER — Encounter: Payer: Self-pay | Admitting: Nurse Practitioner

## 2023-10-04 VITALS — Ht 64.0 in | Wt 129.0 lb

## 2023-10-04 DIAGNOSIS — R399 Unspecified symptoms and signs involving the genitourinary system: Secondary | ICD-10-CM | POA: Diagnosis not present

## 2023-10-04 DIAGNOSIS — R300 Dysuria: Secondary | ICD-10-CM

## 2023-10-04 LAB — URINALYSIS, ROUTINE W REFLEX MICROSCOPIC
Bilirubin Urine: NEGATIVE
Ketones, ur: NEGATIVE
Nitrite: NEGATIVE
Specific Gravity, Urine: 1.015 (ref 1.000–1.030)
Total Protein, Urine: 30 — AB
Urine Glucose: NEGATIVE
Urobilinogen, UA: 0.2 (ref 0.0–1.0)
pH: 6 (ref 5.0–8.0)

## 2023-10-04 MED ORDER — FOSFOMYCIN TROMETHAMINE 3 G PO PACK
3.0000 g | PACK | Freq: Once | ORAL | 0 refills | Status: AC
Start: 2023-10-04 — End: 2023-10-04

## 2023-10-04 NOTE — Progress Notes (Signed)
 MyChart Video Visit    Virtual Visit via Video Note   This visit type was conducted because this format is felt to be most appropriate for this patient at this time. Physical exam was limited by quality of the video and audio technology used for the visit. CMA was able to get the patient set up on a video visit.  Patient location: Home. Patient, patient's son, daughter, and provider in visit Provider location: Office  I discussed the limitations of evaluation and management by telemedicine and the availability of in person appointments. The patient expressed understanding and agreed to proceed.  Visit Date: 10/04/2023  Today's healthcare provider: Bluford Burkitt, NP     Subjective:    Patient ID: Anita Ferrell, female    DOB: December 14, 1932, 89 y.o.   MRN: [id number]  Chief Complaint  Patient presents with   Dysuria    HPI  Discussed the use of AI scribe software for clinical note transcription with the patient, who gave verbal consent to proceed.  History of Present Illness   Anita Ferrell is an 89 year old female who presents with symptoms suggestive of a urinary tract infection.  She has been experiencing dysuria for the past few days, which has slightly improved today but persists. Initially, she had urgency and urinary incontinence, feeling the urge to urinate and experiencing leakage before reaching the bathroom. This symptom has improved today, but she continues to experience urinary frequency. No hematuria, abdominal pain, or fever. She reports adequate fluid intake.  She is allergic to penicillin and has not been on antibiotics for a long time. She does not recall having a urinary tract infection in the past.  She recently completed iron  infusions and is on a new medication, Ohtuvayre  nebulizer solution, which she did not use last night or today. She experienced diarrhea yesterday but is fine today.      Past Medical History:  Diagnosis Date   A-fib (HCC)     Allergy    Asthma    CHF (congestive heart failure) (HCC)    COPD (chronic obstructive pulmonary disease) (HCC)    GERD (gastroesophageal reflux disease)    Hypercholesteremia    Hypertension    Mitral valve insufficiency    Pulmonary hypertension (HCC)    Right Ventricular systolic pressure at 60 mm Hg by echo on july of 2011; Right heart cardic catherization revealed pulmonary artery pressusre of 27/10 with a mean of; Ventricular pressure 29/10 with pulmonary capillary wedge at 9   TB (pulmonary tuberculosis)    treated with INH therapy     Past Surgical History:  Procedure Laterality Date   CARDIAC CATHETERIZATION  10/2016   [facility]   CATARACT EXTRACTION Bilateral 2008   CHOLECYSTECTOMY     ORIF WRIST FRACTURE Right 09/24/2021   Procedure: OPEN REDUCTION INTERNAL FIXATION OF RIGHT DISTAL RADIUS FRACTURE;  Surgeon: Molli Angelucci, MD;  Location: [facility] ORS;  Service: Orthopedics;  Laterality: Right;   TONSILLECTOMY  1964    Family History  Problem Relation Age of Onset   Heart disease Mother    Heart disease Father    Hypertension Sister    Heart disease Brother    Breast cancer Maternal Aunt     Social History   Socioeconomic History   Marital status: Widowed    Spouse name: Not on file   Number of children: 3   Years of education: Not on file   Highest education level: Not on file  Occupational History  Not on file  Tobacco Use   Smoking status: Former    Current packs/day: 0.00    Average packs/day: 1 pack/day for 40.0 years (40.0 ttl pk-yrs)    Types: Cigarettes    Start date: 08/17/1950    Quit date: 08/17/1990    Years since quitting: 33.1   Smokeless tobacco: Never   Tobacco comments:    Quit in 1992  Vaping Use   Vaping status: Never Used  Substance and Sexual Activity   Alcohol use: No    Alcohol/week: 0.0 standard drinks of alcohol   Drug use: No   Sexual activity: Not Currently  Other Topics Concern   Not on file  Social History Narrative   Not  on file   Social Drivers of Health   Financial Resource Strain: Low Risk  (02/14/2018)   Overall Financial Resource Strain (CARDIA)    Difficulty of Paying Living Expenses: Not hard at all  Food Insecurity: No Food Insecurity (07/14/2023)   Hunger Vital Sign    Worried About Running Out of Food in the Last Year: Never true    Ran Out of Food in the Last Year: Never true  Transportation Needs: No Transportation Needs (07/14/2023)   PRAPARE - Administrator, Civil Service (Medical): No    Lack of Transportation (Non-Medical): No  Physical Activity: Unknown (02/14/2018)   Exercise Vital Sign    Days of Exercise per Week: 0 days    Minutes of Exercise per Session: Not on file  Stress: No Stress Concern Present (02/14/2018)   [employer] of Occupational Health - Occupational Stress Questionnaire    Feeling of Stress : Not at all  Social Connections: Not on file  Intimate Partner Violence: Not At Risk (07/14/2023)   Humiliation, Afraid, Rape, and Kick questionnaire    Fear of Current or Ex-Partner: No    Emotionally Abused: No    Physically Abused: No    Sexually Abused: No    Outpatient Medications Prior to Visit  Medication Sig Dispense Refill   acetaminophen  (TYLENOL ) 500 MG tablet Take 500 mg by mouth as needed.      albuterol  (PROVENTIL ) (2.5 MG/3ML) 0.083% nebulizer solution INHALE CONTENTS 1 VIAL VIA NEBULIZER EVERY 6 HOURS AS NEEDED WHEEZE/SHORTNESS OF BREATH J44.9 375 mL 5   albuterol  (VENTOLIN  HFA) 108 (90 Base) MCG/ACT inhaler Inhale 1-2 puffs into the lungs every 6 (six) hours as needed for wheezing or shortness of breath. 18 g 5   aspirin  EC 81 MG tablet Take 81 mg by mouth daily.     azelastine  (ASTELIN ) 0.1 % nasal spray Use in each nostril as directed 30 mL 5   Calcium  Carbonate-Vitamin D  600-400 MG-UNIT tablet Take 1 tablet by mouth at bedtime.     Ensifentrine  (OHTUVAYRE ) 3 MG/2.5ML SUSP Inhale into the lungs 2 (two) times daily.     famotidine  (PEPCID) 20 MG tablet Take 20 mg by mouth daily as needed for heartburn or indigestion.     ferrous sulfate  325 (65 FE) MG tablet Take 325 mg by mouth 2 (two) times daily with a meal.      fexofenadine (ALLEGRA) 180 MG tablet Take 180 mg by mouth daily.     Fluticasone -Umeclidin-Vilant (TRELEGY ELLIPTA ) 100-62.5-25 MCG/ACT AEPB INHALE 1 PUFF BY MOUTH EVERY DAY 60 each 11   furosemide  (LASIX ) 20 MG tablet TAKE 1 TABLET BY MOUTH EVERY DAY 90 tablet 3   lansoprazole (PREVACID) 30 MG capsule Take 30 mg by mouth daily.  meclizine (ANTIVERT) 25 MG tablet Take 25 mg by mouth as needed for dizziness.     metoprolol  tartrate (LOPRESSOR ) 25 MG tablet TAKE 1 TABLET BY MOUTH TWICE A DAY 180 tablet 3   Multiple Vitamins-Minerals (CENTRUM SILVER PO) Take by mouth.     OXYGEN  Inhale 2 L into the lungs at bedtime.     Probiotic Product (ALIGN PO) Take by mouth.     rosuvastatin  (CRESTOR ) 10 MG tablet TAKE 1 TABLET BY MOUTH EVERY DAY 90 tablet 3   rosuvastatin  (CRESTOR ) 10 MG tablet Take 1 tablet (10 mg total) by mouth daily. 90 tablet 3   spironolactone  (ALDACTONE ) 25 MG tablet Take 0.5 tablets (12.5 mg total) by mouth daily. 45 tablet 3   No facility-administered medications prior to visit.    Allergies  Allergen Reactions   Penicillin G Swelling   Penicillins Swelling and Other (See Comments)    Has patient had a PCN reaction causing immediate rash, facial/tongue/throat swelling, SOB or lightheadedness with hypotension: Yes Has patient had a PCN reaction causing severe rash involving mucus membranes or skin necrosis: No Has patient had a PCN reaction that required hospitalization No Has patient had a PCN reaction occurring within the last 10 years: No If all of the above answers are NO, then may proceed with Cephalosporin use.    ROS See HPI    Objective:    Physical Exam  Ht 5' 4 (1.626 m)   Wt 129 lb (58.5 kg)   BMI 22.14 kg/m  Wt Readings from Last 3 Encounters:  10/04/23 129 lb  (58.5 kg)  09/21/23 129 lb 9.6 oz (58.8 kg)  09/08/23 127 lb 9.6 oz (57.9 kg)   GENERAL: alert, oriented, appears well and in no acute distress   HEENT: atraumatic, conjunttiva clear, no obvious abnormalities on inspection of external nose and ears   NECK: normal movements of the head and neck   LUNGS: on inspection no signs of respiratory distress, breathing rate appears normal, no obvious gross SOB, gasping or wheezing   CV: no obvious cyanosis   MS: moves all visible extremities without noticeable abnormality   PSYCH/NEURO: pleasant and cooperative, no obvious depression or anxiety, speech and thought processing grossly intact    Assessment & Plan:   Problem List Items Addressed This Visit     UTI symptoms - Primary   Dysuria, increased frequency, and urgency present, UA with leukocytes and hematuria suggestive of a UTI. Awaiting urine culture. She has a penicillin allergy. Prescribe Fosfomycin 3 mg for one dose. Encouraged increased fluid intake. We will contact patient with culture results.        Relevant Medications   fosfomycin (MONUROL) 3 g PACK    I am having Anita Ferrell start on fosfomycin. I am also having her maintain her aspirin  EC, fexofenadine, lansoprazole, Calcium  Carbonate-Vitamin D , ferrous sulfate , Multiple Vitamins-Minerals (CENTRUM SILVER PO), Probiotic Product (ALIGN PO), acetaminophen , meclizine, OXYGEN , famotidine, albuterol , spironolactone , furosemide , Trelegy Ellipta , rosuvastatin , rosuvastatin , azelastine , metoprolol  tartrate, albuterol , and Ohtuvayre .  Meds ordered this encounter  Medications   fosfomycin (MONUROL) 3 g PACK    Sig: Take 3 g by mouth once for 1 dose.    Dispense:  3 g    Refill:  0    Supervising Provider:   Thersia Flax [[id number]]    I discussed the assessment and treatment plan with the patient. The patient was provided an opportunity to ask questions and all were answered. The patient agreed with the plan  and  demonstrated an understanding of the instructions.   The patient was advised to call back or seek an in-person evaluation if the symptoms worsen or if the condition fails to improve as anticipated.   Bluford Burkitt, NP [facility] at [facility] [phone] (phone) [phone] (fax)  [facility]

## 2023-10-04 NOTE — Assessment & Plan Note (Addendum)
 Dysuria, increased frequency, and urgency present, UA with leukocytes and hematuria suggestive of a UTI. Awaiting urine culture. She has a penicillin allergy. Prescribe Fosfomycin 3 mg for one dose. Encouraged increased fluid intake. We will contact patient with culture results.

## 2023-10-06 LAB — URINE CULTURE
MICRO NUMBER:: 16590512
SPECIMEN QUALITY:: ADEQUATE

## 2023-10-07 ENCOUNTER — Ambulatory Visit: Payer: Self-pay | Admitting: Nurse Practitioner

## 2023-10-17 ENCOUNTER — Ambulatory Visit: Attending: Pulmonary Disease

## 2023-10-17 DIAGNOSIS — R0609 Other forms of dyspnea: Secondary | ICD-10-CM | POA: Diagnosis not present

## 2023-10-18 ENCOUNTER — Ambulatory Visit: Payer: Self-pay | Admitting: Pulmonary Disease

## 2023-10-18 LAB — ECHOCARDIOGRAM COMPLETE
AR max vel: 2.04 cm2
AV Area VTI: 1.92 cm2
AV Area mean vel: 2 cm2
AV Mean grad: 3 mmHg
AV Peak grad: 6.1 mmHg
Ao pk vel: 1.23 m/s
Area-P 1/2: 3.91 cm2
Calc EF: 60.7 %
S' Lateral: 3.1 cm
Single Plane A2C EF: 60.1 %
Single Plane A4C EF: 59.8 %

## 2023-10-19 NOTE — Telephone Encounter (Signed)
 I notified the patient of the Echo results. She wanted me to tell you that she had to stop the [facility] about 1- 1.5 weeks ago due to Urinary and GI problems. She is currently talking to her PCP about the issue and how to resolve it.   Nothing further needed.

## 2023-10-19 NOTE — Progress Notes (Signed)
 Noted. Nothing further needed.

## 2023-10-26 ENCOUNTER — Ambulatory Visit (INDEPENDENT_AMBULATORY_CARE_PROVIDER_SITE_OTHER): Payer: PRIVATE HEALTH INSURANCE | Admitting: Internal Medicine

## 2023-10-26 ENCOUNTER — Encounter: Payer: Self-pay | Admitting: Internal Medicine

## 2023-10-26 VITALS — BP 128/72 | HR 96 | Resp 16 | Ht 64.0 in | Wt 128.0 lb

## 2023-10-26 DIAGNOSIS — M810 Age-related osteoporosis without current pathological fracture: Secondary | ICD-10-CM | POA: Diagnosis not present

## 2023-10-26 DIAGNOSIS — R739 Hyperglycemia, unspecified: Secondary | ICD-10-CM | POA: Diagnosis not present

## 2023-10-26 DIAGNOSIS — I272 Pulmonary hypertension, unspecified: Secondary | ICD-10-CM | POA: Diagnosis not present

## 2023-10-26 DIAGNOSIS — E7800 Pure hypercholesterolemia, unspecified: Secondary | ICD-10-CM | POA: Diagnosis not present

## 2023-10-26 DIAGNOSIS — K2100 Gastro-esophageal reflux disease with esophagitis, without bleeding: Secondary | ICD-10-CM

## 2023-10-26 DIAGNOSIS — N183 Chronic kidney disease, stage 3 unspecified: Secondary | ICD-10-CM | POA: Diagnosis not present

## 2023-10-26 DIAGNOSIS — J449 Chronic obstructive pulmonary disease, unspecified: Secondary | ICD-10-CM

## 2023-10-26 DIAGNOSIS — D649 Anemia, unspecified: Secondary | ICD-10-CM | POA: Diagnosis not present

## 2023-10-26 DIAGNOSIS — I10 Essential (primary) hypertension: Secondary | ICD-10-CM | POA: Diagnosis not present

## 2023-10-26 DIAGNOSIS — R350 Frequency of micturition: Secondary | ICD-10-CM | POA: Diagnosis not present

## 2023-10-26 LAB — LIPID PANEL
Cholesterol: 104 mg/dL (ref 0–200)
HDL: 38.1 mg/dL — ABNORMAL LOW (ref >39.00)
LDL Cholesterol: 44 mg/dL (ref 0–99)
NonHDL: 65.86
Total CHOL/HDL Ratio: 3
Triglycerides: 109 mg/dL (ref 0.0–149.0)
VLDL: 21.8 mg/dL (ref 0.0–40.0)

## 2023-10-26 LAB — HEPATIC FUNCTION PANEL
ALT: 20 U/L (ref 0–35)
AST: 27 U/L (ref 0–37)
Albumin: 4.1 g/dL (ref 3.5–5.2)
Alkaline Phosphatase: 82 U/L (ref 39–117)
Bilirubin, Direct: 0.1 mg/dL (ref 0.0–0.3)
Total Bilirubin: 0.4 mg/dL (ref 0.2–1.2)
Total Protein: 6.7 g/dL (ref 6.0–8.3)

## 2023-10-26 LAB — TSH: TSH: 0.97 u[IU]/mL (ref 0.35–5.50)

## 2023-10-26 LAB — BASIC METABOLIC PANEL WITH GFR
BUN: 22 mg/dL (ref 6–23)
CO2: 29 meq/L (ref 19–32)
Calcium: 10.1 mg/dL (ref 8.4–10.5)
Chloride: 104 meq/L (ref 96–112)
Creatinine, Ser: 1.39 mg/dL — ABNORMAL HIGH (ref 0.40–1.20)
GFR: 33.65 mL/min — ABNORMAL LOW (ref >60.00)
Glucose, Bld: 108 mg/dL — ABNORMAL HIGH (ref 70–99)
Potassium: 5.1 meq/L (ref 3.5–5.1)
Sodium: 142 meq/L (ref 135–145)

## 2023-10-26 LAB — HEMOGLOBIN A1C: Hgb A1c MFr Bld: 6.1 % (ref 4.6–6.5)

## 2023-10-26 MED ORDER — NYSTATIN 100000 UNIT/GM EX CREA
1.0000 | TOPICAL_CREAM | Freq: Two times a day (BID) | CUTANEOUS | 0 refills | Status: DC
Start: 2023-10-26 — End: 2023-11-25

## 2023-10-26 NOTE — Progress Notes (Signed)
 Subjective:    Patient ID: Anita Ferrell, female    DOB: 1933/01/14, 89 y.o.   MRN: [id number]  Patient here for  Chief Complaint  Patient presents with   Medical Management of Chronic Issues    HPI Here for a scheduled follow up - follow up regarding COPD, hypertension and hypercholesterolemia. Continues on trelegy and oxygen  at night. Followed by Dr Cherilyn - prolia  - last 06/07/23. Saw Dr Jacobo 07/14/23 - IDA. Recommended IV venofer . Saw Dr Tamea 09/21/23 - breathing slightly declined compared to previous. Recommended to continue using nocturnal oxygen . Continue trelegy and as needed albuterol . ECHO - overall ok.  Recently evaluated by Leron Glance - with urinary symptoms. Urine culture negative for infection. She still reports noticing some urinary incontinence. Wearing a pad. Also some dysuria and nocturia. On questioning about the discomfort, she describes burning on the outside of the vaginal area and the vaginal opening. No intravaginal symptoms. No discharge.    Past Medical History:  Diagnosis Date   A-fib (HCC)    Allergy    Asthma    CHF (congestive heart failure) (HCC)    COPD (chronic obstructive pulmonary disease) (HCC)    GERD (gastroesophageal reflux disease)    Hypercholesteremia    Hypertension    Mitral valve insufficiency    Pulmonary hypertension (HCC)    Right Ventricular systolic pressure at 60 mm Hg by echo on july of 2011; Right heart cardic catherization revealed pulmonary artery pressusre of 27/10 with a mean of; Ventricular pressure 29/10 with pulmonary capillary wedge at 9   TB (pulmonary tuberculosis)    treated with INH therapy    Past Surgical History:  Procedure Laterality Date   CARDIAC CATHETERIZATION  10/2016   [facility]   CATARACT EXTRACTION Bilateral 2008   CHOLECYSTECTOMY     ORIF WRIST FRACTURE Right 09/24/2021   Procedure: OPEN REDUCTION INTERNAL FIXATION OF RIGHT DISTAL RADIUS FRACTURE;  Surgeon: Kathlynn Sharper, MD;  Location: [facility]  ORS;  Service: Orthopedics;  Laterality: Right;   TONSILLECTOMY  1964   Family History  Problem Relation Age of Onset   Heart disease Mother    Heart disease Father    Hypertension Sister    Heart disease Brother    Breast cancer Maternal Aunt    Social History   Socioeconomic History   Marital status: Widowed    Spouse name: Not on file   Number of children: 3   Years of education: Not on file   Highest education level: Not on file  Occupational History   Not on file  Tobacco Use   Smoking status: Former    Current packs/day: 0.00    Average packs/day: 1 pack/day for 40.0 years (40.0 ttl pk-yrs)    Types: Cigarettes    Start date: 08/17/1950    Quit date: 08/17/1990    Years since quitting: 33.2   Smokeless tobacco: Never   Tobacco comments:    Quit in 1992  Vaping Use   Vaping status: Never Used  Substance and Sexual Activity   Alcohol use: No    Alcohol/week: 0.0 standard drinks of alcohol   Drug use: No   Sexual activity: Not Currently  Other Topics Concern   Not on file  Social History Narrative   Not on file   Social Drivers of Health   Financial Resource Strain: Low Risk  (02/14/2018)   Overall Financial Resource Strain (CARDIA)    Difficulty of Paying Living Expenses: Not hard at all  Food Insecurity: No Food Insecurity (07/14/2023)   Hunger Vital Sign    Worried About Running Out of Food in the Last Year: Never true    Ran Out of Food in the Last Year: Never true  Transportation Needs: No Transportation Needs (07/14/2023)   PRAPARE - Administrator, Civil Service (Medical): No    Lack of Transportation (Non-Medical): No  Physical Activity: Unknown (02/14/2018)   Exercise Vital Sign    Days of Exercise per Week: 0 days    Minutes of Exercise per Session: Not on file  Stress: No Stress Concern Present (02/14/2018)   [employer] of Occupational Health - Occupational Stress Questionnaire    Feeling of Stress : Not at all  Social  Connections: Not on file     Review of Systems  Constitutional:  Negative for appetite change and unexpected weight change.  HENT:  Negative for congestion and sinus pressure.   Respiratory:  Negative for chest tightness.        Breathing overall stable.   Cardiovascular:  Negative for chest pain, palpitations and leg swelling.  Gastrointestinal:  Negative for abdominal pain, constipation, diarrhea, nausea and vomiting.  Genitourinary:  Positive for dysuria.       Some urinary incontinence. Vaginal burning as outlined.   Musculoskeletal:  Negative for joint swelling and myalgias.  Skin:  Negative for color change and wound.  Neurological:  Negative for dizziness and headaches.  Psychiatric/Behavioral:  Negative for agitation and dysphoric mood.        Objective:     BP 128/72   Pulse 96   Resp 16   Ht 5' 4 (1.626 m)   Wt 128 lb (58.1 kg)   SpO2 97%   BMI 21.97 kg/m  Wt Readings from Last 3 Encounters:  10/26/23 128 lb (58.1 kg)  10/04/23 129 lb (58.5 kg)  09/21/23 129 lb 9.6 oz (58.8 kg)    Physical Exam Vitals reviewed.  Constitutional:      General: She is not in acute distress.    Appearance: Normal appearance.  HENT:     Head: Normocephalic and atraumatic.     Right Ear: External ear normal.     Left Ear: External ear normal.     Mouth/Throat:     Pharynx: No oropharyngeal exudate or posterior oropharyngeal erythema.  Eyes:     General: No scleral icterus.       Right eye: No discharge.        Left eye: No discharge.     Conjunctiva/sclera: Conjunctivae normal.  Neck:     Thyroid : No thyromegaly.  Cardiovascular:     Rate and Rhythm: Normal rate and regular rhythm.  Pulmonary:     Effort: No respiratory distress.     Breath sounds: Normal breath sounds. No wheezing.  Abdominal:     General: Bowel sounds are normal.     Palpations: Abdomen is soft.     Tenderness: There is no abdominal tenderness.  Genitourinary:    Comments: Vaginal exam -  erythema - peri vaginal erythema.  Musculoskeletal:        General: No swelling or tenderness.     Cervical back: Neck supple. No tenderness.  Lymphadenopathy:     Cervical: No cervical adenopathy.  Skin:    Findings: No erythema or rash.  Neurological:     Mental Status: She is alert.  Psychiatric:        Mood and Affect: Mood normal.  Behavior: Behavior normal.         Outpatient Encounter Medications as of 10/26/2023  Medication Sig   acetaminophen  (TYLENOL ) 500 MG tablet Take 500 mg by mouth as needed.    albuterol  (PROVENTIL ) (2.5 MG/3ML) 0.083% nebulizer solution INHALE CONTENTS 1 VIAL VIA NEBULIZER EVERY 6 HOURS AS NEEDED WHEEZE/SHORTNESS OF BREATH J44.9   albuterol  (VENTOLIN  HFA) 108 (90 Base) MCG/ACT inhaler Inhale 1-2 puffs into the lungs every 6 (six) hours as needed for wheezing or shortness of breath.   aspirin  EC 81 MG tablet Take 81 mg by mouth daily.   azelastine  (ASTELIN ) 0.1 % nasal spray Use in each nostril as directed   Calcium  Carbonate-Vitamin D  600-400 MG-UNIT tablet Take 1 tablet by mouth at bedtime.   famotidine (PEPCID) 20 MG tablet Take 20 mg by mouth daily as needed for heartburn or indigestion.   ferrous sulfate  325 (65 FE) MG tablet Take 325 mg by mouth 2 (two) times daily with a meal.    fexofenadine (ALLEGRA) 180 MG tablet Take 180 mg by mouth daily.   Fluticasone -Umeclidin-Vilant (TRELEGY ELLIPTA ) 100-62.5-25 MCG/ACT AEPB INHALE 1 PUFF BY MOUTH EVERY DAY   furosemide  (LASIX ) 20 MG tablet TAKE 1 TABLET BY MOUTH EVERY DAY   lansoprazole (PREVACID) 30 MG capsule Take 30 mg by mouth daily.   meclizine (ANTIVERT) 25 MG tablet Take 25 mg by mouth as needed for dizziness.   metoprolol  tartrate (LOPRESSOR ) 25 MG tablet TAKE 1 TABLET BY MOUTH TWICE A DAY   Multiple Vitamins-Minerals (CENTRUM SILVER PO) Take by mouth.   nystatin  cream (MYCOSTATIN ) Apply 1 Application topically 2 (two) times daily.   OXYGEN  Inhale 2 L into the lungs at bedtime.    Probiotic Product (ALIGN PO) Take by mouth.   rosuvastatin  (CRESTOR ) 10 MG tablet Take 1 tablet (10 mg total) by mouth daily.   spironolactone  (ALDACTONE ) 25 MG tablet Take 0.5 tablets (12.5 mg total) by mouth daily.   Ensifentrine  (OHTUVAYRE ) 3 MG/2.5ML SUSP Inhale into the lungs 2 (two) times daily. (Patient not taking: Reported on 10/26/2023)   [DISCONTINUED] rosuvastatin  (CRESTOR ) 10 MG tablet TAKE 1 TABLET BY MOUTH EVERY DAY   No facility-administered encounter medications on file as of 10/26/2023.     Lab Results  Component Value Date   WBC 3.5 (L) 06/22/2023   HGB 9.4 (L) 06/22/2023   HCT 30.2 (L) 06/22/2023   PLT 187.0 06/22/2023   GLUCOSE 108 (H) 10/26/2023   CHOL 104 10/26/2023   TRIG 109.0 10/26/2023   HDL 38.10 (L) 10/26/2023   LDLDIRECT 127.1 08/25/2012   LDLCALC 44 10/26/2023   ALT 20 10/26/2023   AST 27 10/26/2023   NA 142 10/26/2023   K 5.1 10/26/2023   CL 104 10/26/2023   CREATININE 1.39 (H) 10/26/2023   BUN 22 10/26/2023   CO2 29 10/26/2023   TSH 0.97 10/26/2023   HGBA1C 6.1 10/26/2023    US  OR NERVE BLOCK-IMAGE ONLY [facility]) Result Date: 09/24/2021 There is no interpretation for this exam.  This order is for images obtained during a surgical procedure.  Please See Surgeries Tab for more information regarding the procedure.   DG MINI C-ARM IMAGE ONLY Result Date: 09/24/2021 There is no interpretation for this exam.  This order is for images obtained during a surgical procedure.  Please See Surgeries Tab for more information regarding the procedure.       Assessment & Plan:  Essential hypertension Assessment & Plan: Continue aldactone  and metoprolol . Follow pressures. Follow metabolic panel.  No changes in medication today.   Orders: -     Basic metabolic panel with GFR  Stage 3 chronic kidney disease, unspecified whether stage 3a or 3b CKD (HCC) Assessment & Plan: Has been evaluated by Dr Dennise. Continue to avoid antiinflammatory medication. Follow  metabolic panel.    Pulmonary HTN (HCC) Assessment & Plan: Continues trelegy and oxygen  at night. Known pulmonary hypertension. ECHO - 10/18/23 - EF 60-65%, G1DD, mild LA enlargement and mild MR.  Also PFTs planned. Continue nebulized albuterol  bid. Pulmonary discussed adding ohtuvayre . Question -insurance authorization.    Hyperglycemia Assessment & Plan: Low carb diet and exercise. Follow met b and A1c.   Orders: -     Hemoglobin A1c  Hypercholesterolemia Assessment & Plan: Continue crestor . Low cholesterol diet and exercise. Follow lipid panel and liver function tests.   Orders: -     Hepatic function panel -     Lipid panel -     TSH  Urinary frequency Assessment & Plan: Urinary incontinence as outlined. Dysuria reported. On questioning, perivaginal irritation. Some nocturia. Wanted urine rechecked. Will recheck urine and culture today. Also, vaginal exam - erythema - peri vaginal area. Will treat with nystatin  cream as directed. Follow.   Orders: -     Urinalysis, Routine w reflex microscopic; Future -     Urine Culture; Future  Anemia, unspecified type Assessment & Plan: Has been evaluated by hematology previously.  Had recommended following as long as hgb stable.  Hgb remains decreased, but stable last check - 9.7.  have discussed further w/up and follow up with hematology.  Has desired no further w/up or evaluation at this time.    COPD, moderate (HCC) Assessment & Plan: Continues trelegy and oxygen  at night. Planning for PFTs. Continue nebulized albuterol  bid. Pulmonary discussed adding ohtuvayre  (insurance).  Breathing stable.    Gastroesophageal reflux disease with esophagitis without hemorrhage Assessment & Plan: Continues pepcid.    Osteoporosis without current pathological fracture, unspecified osteoporosis type Assessment & Plan: Receiving prolia . Last injection 06/07/23.    Other orders -     Nystatin ; Apply 1 Application topically 2 (two) times  daily.  Dispense: 30 g; Refill: 0     Allena Hamilton, MD

## 2023-10-27 DIAGNOSIS — R350 Frequency of micturition: Secondary | ICD-10-CM | POA: Diagnosis not present

## 2023-10-27 LAB — URINALYSIS, ROUTINE W REFLEX MICROSCOPIC
Bilirubin Urine: NEGATIVE
Ketones, ur: NEGATIVE
Nitrite: NEGATIVE
Specific Gravity, Urine: 1.015 (ref 1.000–1.030)
Urine Glucose: NEGATIVE
Urobilinogen, UA: 0.2 (ref 0.0–1.0)
pH: 6 (ref 5.0–8.0)

## 2023-10-29 LAB — URINE CULTURE
MICRO NUMBER:: 16682487
SPECIMEN QUALITY:: ADEQUATE

## 2023-10-31 ENCOUNTER — Other Ambulatory Visit: Payer: Self-pay | Admitting: Internal Medicine

## 2023-10-31 ENCOUNTER — Encounter: Payer: Self-pay | Admitting: Internal Medicine

## 2023-10-31 ENCOUNTER — Ambulatory Visit: Payer: Self-pay | Admitting: Internal Medicine

## 2023-10-31 DIAGNOSIS — D649 Anemia, unspecified: Secondary | ICD-10-CM

## 2023-10-31 DIAGNOSIS — R300 Dysuria: Secondary | ICD-10-CM

## 2023-10-31 DIAGNOSIS — R399 Unspecified symptoms and signs involving the genitourinary system: Secondary | ICD-10-CM

## 2023-10-31 DIAGNOSIS — E875 Hyperkalemia: Secondary | ICD-10-CM

## 2023-10-31 DIAGNOSIS — R319 Hematuria, unspecified: Secondary | ICD-10-CM

## 2023-10-31 MED ORDER — CIPROFLOXACIN HCL 250 MG PO TABS: 250.0000 mg | ORAL_TABLET | Freq: Two times a day (BID) | ORAL | 0 refills | Status: DC

## 2023-10-31 NOTE — Assessment & Plan Note (Signed)
 Has been evaluated by hematology previously.  Had recommended following as long as hgb stable.  Hgb remains decreased, but stable last check - 9.7.  have discussed further w/up and follow up with hematology.  Has desired no further w/up or evaluation at this time.

## 2023-10-31 NOTE — Assessment & Plan Note (Signed)
 Receiving prolia . Last injection 06/07/23.

## 2023-10-31 NOTE — Progress Notes (Signed)
Order placed for f/u labs.  

## 2023-10-31 NOTE — Assessment & Plan Note (Signed)
 Continue crestor.  Low cholesterol diet and exercise.  Follow lipid panel and liver function tests.

## 2023-10-31 NOTE — Assessment & Plan Note (Signed)
 Urinary incontinence as outlined. Dysuria reported. On questioning, perivaginal irritation. Some nocturia. Wanted urine rechecked. Will recheck urine and culture today. Also, vaginal exam - erythema - peri vaginal area. Will treat with nystatin  cream as directed. Follow.

## 2023-10-31 NOTE — Assessment & Plan Note (Addendum)
 Continues trelegy and oxygen  at night. Planning for PFTs. Continue nebulized albuterol  bid. Pulmonary discussed adding ohtuvayre  (insurance).  Breathing stable.

## 2023-10-31 NOTE — Assessment & Plan Note (Signed)
 Continues trelegy and oxygen  at night. Known pulmonary hypertension. ECHO - 10/18/23 - EF 60-65%, G1DD, mild LA enlargement and mild MR.  Also PFTs planned. Continue nebulized albuterol  bid. Pulmonary discussed adding ohtuvayre . Question -insurance authorization.

## 2023-10-31 NOTE — Assessment & Plan Note (Signed)
 Has been evaluated by Dr Thedore Mins. Continue to avoid antiinflammatory medication. Follow metabolic panel.

## 2023-10-31 NOTE — Assessment & Plan Note (Signed)
Continues pepcid

## 2023-10-31 NOTE — Assessment & Plan Note (Signed)
 Continue aldactone  and metoprolol . Follow pressures. Follow metabolic panel. No changes in medication today.

## 2023-10-31 NOTE — Assessment & Plan Note (Signed)
 Low-carb diet and exercise.  Follow met b and A1c.

## 2023-11-10 ENCOUNTER — Encounter: Payer: Self-pay | Admitting: Pulmonary Disease

## 2023-11-10 DIAGNOSIS — R0609 Other forms of dyspnea: Secondary | ICD-10-CM

## 2023-11-10 DIAGNOSIS — J449 Chronic obstructive pulmonary disease, unspecified: Secondary | ICD-10-CM

## 2023-11-10 MED ORDER — ALBUTEROL SULFATE HFA 108 (90 BASE) MCG/ACT IN AERS
1.0000 | INHALATION_SPRAY | Freq: Four times a day (QID) | RESPIRATORY_TRACT | 5 refills | Status: AC | PRN
Start: 2023-11-10 — End: ?

## 2023-11-14 ENCOUNTER — Other Ambulatory Visit (INDEPENDENT_AMBULATORY_CARE_PROVIDER_SITE_OTHER)

## 2023-11-14 ENCOUNTER — Telehealth: Payer: Self-pay

## 2023-11-14 DIAGNOSIS — E875 Hyperkalemia: Secondary | ICD-10-CM

## 2023-11-14 DIAGNOSIS — R319 Hematuria, unspecified: Secondary | ICD-10-CM | POA: Diagnosis not present

## 2023-11-14 LAB — SPECIMEN STATUS REPORT

## 2023-11-14 NOTE — Telephone Encounter (Signed)
 CRITICAL VALUE STICKER  CRITICAL VALUE: Stat Potassium 5.2  RECEIVER (on-site recipient of call):  Ival Pacer  DATE & TIME NOTIFIED: 11/14/23 12:59pm  MESSENGER (representative from lab): Renna ([facility])  MD NOTIFIED: Dr. Glendia  TIME OF NOTIFICATION: 1:03 pm

## 2023-11-14 NOTE — Telephone Encounter (Signed)
 See result note.

## 2023-11-14 NOTE — Addendum Note (Signed)
 Addended by: MARYLEN PRO A on: 11/14/2023 10:17 AM   Modules accepted: Orders

## 2023-11-15 ENCOUNTER — Inpatient Hospital Stay: Payer: PRIVATE HEALTH INSURANCE | Attending: Oncology

## 2023-11-15 ENCOUNTER — Ambulatory Visit: Payer: Self-pay | Admitting: Internal Medicine

## 2023-11-15 DIAGNOSIS — R319 Hematuria, unspecified: Secondary | ICD-10-CM

## 2023-11-15 DIAGNOSIS — Z87891 Personal history of nicotine dependence: Secondary | ICD-10-CM | POA: Insufficient documentation

## 2023-11-15 DIAGNOSIS — D509 Iron deficiency anemia, unspecified: Secondary | ICD-10-CM | POA: Insufficient documentation

## 2023-11-15 DIAGNOSIS — Z79899 Other long term (current) drug therapy: Secondary | ICD-10-CM | POA: Insufficient documentation

## 2023-11-15 DIAGNOSIS — E875 Hyperkalemia: Secondary | ICD-10-CM

## 2023-11-15 LAB — URINALYSIS, ROUTINE W REFLEX MICROSCOPIC
Specific Gravity, UA: 1.017 (ref 1.005–1.030)
Urobilinogen, Ur: 0.2 mg/dL (ref 0.2–1.0)
pH, UA: 6.5 (ref 5.0–7.5)

## 2023-11-15 LAB — CBC (CANCER CENTER ONLY)
HCT: 30.3 % — ABNORMAL LOW (ref 36.0–46.0)
Hemoglobin: 9.2 g/dL — ABNORMAL LOW (ref 12.0–15.0)
MCH: 27.9 pg (ref 26.0–34.0)
MCHC: 30.4 g/dL (ref 30.0–36.0)
MCV: 91.8 fL (ref 80.0–100.0)
Platelet Count: 184 K/uL (ref 150–400)
RBC: 3.3 MIL/uL — ABNORMAL LOW (ref 3.87–5.11)
RDW: 13.1 % (ref 11.5–15.5)
WBC Count: 3.5 K/uL — ABNORMAL LOW (ref 4.0–10.5)
nRBC: 0 % (ref 0.0–0.2)

## 2023-11-15 LAB — VITAMIN B12: Vitamin B-12: 504 pg/mL (ref 180–914)

## 2023-11-15 LAB — IRON AND TIBC
Iron: 47 ug/dL (ref 28–170)
Saturation Ratios: 16 % (ref 10.4–31.8)
TIBC: 297 ug/dL (ref 250–450)
UIBC: 250 ug/dL

## 2023-11-15 LAB — FOLATE: Folate: >40 ng/mL (ref >5.9)

## 2023-11-15 LAB — MICROSCOPIC EXAMINATION
Bacteria, UA: NONE SEEN
Casts: NONE SEEN /LPF

## 2023-11-15 LAB — FERRITIN: Ferritin: 121 ng/mL (ref 11–307)

## 2023-11-15 LAB — LACTATE DEHYDROGENASE: LDH: 145 U/L (ref 98–192)

## 2023-11-15 LAB — POTASSIUM: Potassium: 5.2 mmol/L (ref 3.5–5.2)

## 2023-11-16 ENCOUNTER — Inpatient Hospital Stay: Payer: PRIVATE HEALTH INSURANCE

## 2023-11-16 ENCOUNTER — Inpatient Hospital Stay (HOSPITAL_BASED_OUTPATIENT_CLINIC_OR_DEPARTMENT_OTHER): Payer: PRIVATE HEALTH INSURANCE | Admitting: Oncology

## 2023-11-16 ENCOUNTER — Encounter: Payer: Self-pay | Admitting: Oncology

## 2023-11-16 VITALS — BP 135/70 | HR 89 | Temp 96.0°F | Resp 18 | Wt 130.0 lb

## 2023-11-16 DIAGNOSIS — Z79899 Other long term (current) drug therapy: Secondary | ICD-10-CM | POA: Diagnosis not present

## 2023-11-16 DIAGNOSIS — D509 Iron deficiency anemia, unspecified: Secondary | ICD-10-CM

## 2023-11-16 DIAGNOSIS — Z87891 Personal history of nicotine dependence: Secondary | ICD-10-CM | POA: Diagnosis not present

## 2023-11-16 NOTE — Progress Notes (Unsigned)
 [facility]  [phone] [phone]  ID: AVRIL BUSSER OB: [birth year]  MR#: [id number]  RDW#:[id number]  Patient Care Team: Glendia Shad, MD as PCP - General (Internal Medicine) Tamea Dedra CROME, MD as Consulting Physician (Pulmonary Disease) Jacobo Evalene PARAS, MD as Consulting Physician (Oncology)  CHIEF COMPLAINT: Iron  deficiency anemia.  INTERVAL HISTORY: Patient returns to clinic today for repeat laboratory work, further evaluation, and consideration of additional IV Venofer .  She continues to feel well and remains asymptomatic.  She does not complain of any weakness or fatigue.  She has no neurologic complaints.  She denies any recent fevers or illnesses.  Patient has a good appetite and denies weight loss.  She has no chest pain, shortness of breath, cough, or hemoptysis.  She denies any nausea, vomiting, constipation, or diarrhea.  She has no melena or hematochezia.  She has no urinary complaints.  Patient offers no specific complaints today.  REVIEW OF SYSTEMS:   Review of Systems  Constitutional: Negative.  Negative for fever, malaise/fatigue and weight loss.  Respiratory: Negative.  Negative for cough, hemoptysis and shortness of breath.   Cardiovascular: Negative.  Negative for chest pain and leg swelling.  Gastrointestinal: Negative.  Negative for abdominal pain, blood in stool and melena.  Genitourinary: Negative.  Negative for hematuria.  Musculoskeletal: Negative.  Negative for back pain.  Skin: Negative.  Negative for rash.  Neurological: Negative.  Negative for dizziness, focal weakness, weakness and headaches.  Psychiatric/Behavioral: Negative.  The patient is not nervous/anxious.     As per HPI. Otherwise, a complete review of systems is negative.  PAST MEDICAL HISTORY: Past Medical History:  Diagnosis Date   A-fib (HCC)    Allergy    Asthma    CHF (congestive heart failure) (HCC)    COPD (chronic obstructive  pulmonary disease) (HCC)    GERD (gastroesophageal reflux disease)    Hypercholesteremia    Hypertension    Mitral valve insufficiency    Pulmonary hypertension (HCC)    Right Ventricular systolic pressure at 60 mm Hg by echo on july of 2011; Right heart cardic catherization revealed pulmonary artery pressusre of 27/10 with a mean of; Ventricular pressure 29/10 with pulmonary capillary wedge at 9   TB (pulmonary tuberculosis)    treated with INH therapy     PAST SURGICAL HISTORY: Past Surgical History:  Procedure Laterality Date   CARDIAC CATHETERIZATION  10/2016   [facility]   CATARACT EXTRACTION Bilateral 2008   CHOLECYSTECTOMY     ORIF WRIST FRACTURE Right 09/24/2021   Procedure: OPEN REDUCTION INTERNAL FIXATION OF RIGHT DISTAL RADIUS FRACTURE;  Surgeon: Kathlynn Sharper, MD;  Location: [facility] ORS;  Service: Orthopedics;  Laterality: Right;   TONSILLECTOMY  1964    FAMILY HISTORY: Family History  Problem Relation Age of Onset   Heart disease Mother    Heart disease Father    Hypertension Sister    Heart disease Brother    Breast cancer Maternal Aunt     ADVANCED DIRECTIVES (Y/N):  N  HEALTH MAINTENANCE: Social History   Tobacco Use   Smoking status: Former    Current packs/day: 0.00    Average packs/day: 1 pack/day for 40.0 years (40.0 ttl pk-yrs)    Types: Cigarettes    Start date: 08/17/1950    Quit date: 08/17/1990    Years since quitting: 33.2   Smokeless tobacco: Never   Tobacco comments:    Quit in 1992  Vaping Use   Vaping status: Never Used  Substance Use Topics   Alcohol use: No    Alcohol/week: 0.0 standard drinks of alcohol   Drug use: No     Colonoscopy:  PAP:  Bone density:  Lipid panel:  Allergies  Allergen Reactions   Penicillin G Swelling   Penicillins Swelling and Other (See Comments)    Has patient had a PCN reaction causing immediate rash, facial/tongue/throat swelling, SOB or lightheadedness with hypotension: Yes Has patient had a PCN  reaction causing severe rash involving mucus membranes or skin necrosis: No Has patient had a PCN reaction that required hospitalization No Has patient had a PCN reaction occurring within the last 10 years: No If all of the above answers are NO, then may proceed with Cephalosporin use.    Current Outpatient Medications  Medication Sig Dispense Refill   acetaminophen  (TYLENOL ) 500 MG tablet Take 500 mg by mouth as needed.      albuterol  (PROVENTIL ) (2.5 MG/3ML) 0.083% nebulizer solution INHALE CONTENTS 1 VIAL VIA NEBULIZER EVERY 6 HOURS AS NEEDED WHEEZE/SHORTNESS OF BREATH J44.9 375 mL 5   albuterol  (VENTOLIN  HFA) 108 (90 Base) MCG/ACT inhaler Inhale 1-2 puffs into the lungs every 6 (six) hours as needed for wheezing or shortness of breath. 18 g 5   aspirin  EC 81 MG tablet Take 81 mg by mouth daily.     azelastine  (ASTELIN ) 0.1 % nasal spray Use in each nostril as directed 30 mL 5   Calcium  Carbonate-Vitamin D  600-400 MG-UNIT tablet Take 1 tablet by mouth at bedtime.     ciprofloxacin  (CIPRO ) 250 MG tablet Take 1 tablet (250 mg total) by mouth 2 (two) times daily. 10 tablet 0   famotidine (PEPCID) 20 MG tablet Take 20 mg by mouth daily as needed for heartburn or indigestion.     ferrous sulfate  325 (65 FE) MG tablet Take 325 mg by mouth 2 (two) times daily with a meal.      fexofenadine (ALLEGRA) 180 MG tablet Take 180 mg by mouth daily.     Fluticasone -Umeclidin-Vilant (TRELEGY ELLIPTA ) 100-62.5-25 MCG/ACT AEPB INHALE 1 PUFF BY MOUTH EVERY DAY 60 each 11   furosemide  (LASIX ) 20 MG tablet TAKE 1 TABLET BY MOUTH EVERY DAY 90 tablet 3   lansoprazole (PREVACID) 30 MG capsule Take 30 mg by mouth daily.     meclizine (ANTIVERT) 25 MG tablet Take 25 mg by mouth as needed for dizziness.     metoprolol  tartrate (LOPRESSOR ) 25 MG tablet TAKE 1 TABLET BY MOUTH TWICE A DAY 180 tablet 3   Multiple Vitamins-Minerals (CENTRUM SILVER PO) Take by mouth.     nystatin  cream (MYCOSTATIN ) Apply 1 Application  topically 2 (two) times daily. 30 g 0   OXYGEN  Inhale 2 L into the lungs at bedtime.     Probiotic Product (ALIGN PO) Take by mouth.     rosuvastatin  (CRESTOR ) 10 MG tablet Take 1 tablet (10 mg total) by mouth daily. 90 tablet 3   spironolactone  (ALDACTONE ) 25 MG tablet Take 0.5 tablets (12.5 mg total) by mouth daily. 45 tablet 3   Ensifentrine  (OHTUVAYRE ) 3 MG/2.5ML SUSP Inhale into the lungs 2 (two) times daily. (Patient not taking: Reported on 11/16/2023)     No current facility-administered medications for this visit.    OBJECTIVE: Vitals:   11/16/23 1117  BP: 135/70  Pulse: 89  Resp: 18  Temp: (!) 96 F (35.6 C)  SpO2: 97%     Body mass index is 22.31 kg/m.    ECOG FS:0 - Asymptomatic  General: Well-developed, well-nourished, no acute distress. Eyes: Pink conjunctiva, anicteric sclera. HEENT: Normocephalic, moist mucous membranes. Lungs: No audible wheezing or coughing. Heart: Regular rate and rhythm. Abdomen: Soft, nontender, no obvious distention. Musculoskeletal: No edema, cyanosis, or clubbing. Neuro: Alert, answering all questions appropriately. Cranial nerves grossly intact. Skin: No rashes or petechiae noted. Psych: Normal affect.   LAB RESULTS:  Lab Results  Component Value Date   NA 142 10/26/2023   K 5.2 11/14/2023   CL 104 10/26/2023   CO2 29 10/26/2023   GLUCOSE 108 (H) 10/26/2023   BUN 22 10/26/2023   CREATININE 1.39 (H) 10/26/2023   CALCIUM  10.1 10/26/2023   PROT 6.7 10/26/2023   ALBUMIN 4.1 10/26/2023   AST 27 10/26/2023   ALT 20 10/26/2023   ALKPHOS 82 10/26/2023   BILITOT 0.4 10/26/2023   GFRNONAA 36 (L) 09/14/2021   GFRAA 37 (L) 05/27/2018    Lab Results  Component Value Date   WBC 3.5 (L) 11/15/2023   NEUTROABS 2.2 06/22/2023   HGB 9.2 (L) 11/15/2023   HCT 30.3 (L) 11/15/2023   MCV 91.8 11/15/2023   PLT 184 11/15/2023   Lab Results  Component Value Date   IRON  47 11/15/2023   TIBC 297 11/15/2023   IRONPCTSAT 16 11/15/2023    Lab Results  Component Value Date   FERRITIN 121 11/15/2023     STUDIES: ECHOCARDIOGRAM COMPLETE Result Date: 10/18/2023    ECHOCARDIOGRAM REPORT   Patient Name:   Nesha  F Foiles Date of Exam: 10/17/2023 Medical Rec #:  [id number]          Height:       64.0 in Accession #:    [id number]         Weight:       129.0 lb Date of Birth:  [birth year]          BSA:          1.624 m Patient Age:    89 years           BP:           130/60 mmHg Patient Gender: F                  HR:           103 bpm. Exam Location:  [facility] Procedure: 2D Echo, Cardiac Doppler and Color Doppler (Both Spectral and Color            Flow Doppler were utilized during procedure). Indications:    R06.9 DOE  History:        Patient has prior history of Echocardiogram examinations, most                 recent 07/08/2023. COPD, Arrythmias:Bradycardia,                 Signs/Symptoms:Shortness of Breath; Risk Factors:Hypertension                 and Dyslipidemia.  Sonographer:    Alm Minerva Referring Phys: [id number] CARMEN L GONZALEZ IMPRESSIONS  1. Left ventricular ejection fraction, by estimation, is 60 to 65%. Left ventricular ejection fraction by 2D MOD biplane is 60.7 %. The left ventricle has normal function. The left ventricle has no regional wall motion abnormalities. Left ventricular diastolic parameters are consistent with Grade I diastolic dysfunction (impaired relaxation).  2. Right ventricular systolic function is normal. The right ventricular size is normal.  3. Left atrial size was mildly dilated.  4. The  mitral valve is normal in structure. Mild mitral valve regurgitation.  5. The aortic valve is grossly normal. Aortic valve regurgitation is not visualized.  6. The inferior vena cava is normal in size with greater than 50% respiratory variability, suggesting right atrial pressure of 3 mmHg. FINDINGS  Left Ventricle: Left ventricular ejection fraction, by estimation, is 60 to 65%. Left ventricular ejection fraction by 2D MOD  biplane is 60.7 %. The left ventricle has normal function. The left ventricle has no regional wall motion abnormalities. The left ventricular internal cavity size was normal in size. There is no left ventricular hypertrophy. Left ventricular diastolic parameters are consistent with Grade I diastolic dysfunction (impaired relaxation). Right Ventricle: The right ventricular size is normal. No increase in right ventricular wall thickness. Right ventricular systolic function is normal. Left Atrium: Left atrial size was mildly dilated. Right Atrium: Right atrial size was normal in size. Pericardium: There is no evidence of pericardial effusion. Mitral Valve: The mitral valve is normal in structure. Mild mitral valve regurgitation. Tricuspid Valve: The tricuspid valve is normal in structure. Tricuspid valve regurgitation is mild. Aortic Valve: The aortic valve is grossly normal. Aortic valve regurgitation is not visualized. Aortic valve mean gradient measures 3.0 mmHg. Aortic valve peak gradient measures 6.1 mmHg. Aortic valve area, by VTI measures 1.92 cm. Pulmonic Valve: The pulmonic valve was normal in structure. Pulmonic valve regurgitation is mild. Aorta: The aortic root and ascending aorta are structurally normal, with no evidence of dilitation. Venous: The inferior vena cava is normal in size with greater than 50% respiratory variability, suggesting right atrial pressure of 3 mmHg. IAS/Shunts: No atrial level shunt detected by color flow Doppler.  LEFT VENTRICLE PLAX 2D                        Biplane EF (MOD) LVIDd:         5.10 cm         LV Biplane EF:   Left LVIDs:         3.10 cm                          ventricular LV PW:         1.10 cm                          ejection LV IVS:        1.00 cm                          fraction by LVOT diam:     2.00 cm                          2D MOD LV SV:         51                               biplane is LV SV Index:   31                               60.7 %. LVOT Area:      3.14 cm  Diastology                                LV e' medial:    5.44 cm/s LV Volumes (MOD)               LV E/e' medial:  11.3 LV vol d, MOD    56.2 ml       LV e' lateral:   11.40 cm/s A2C:                           LV E/e' lateral: 5.4 LV vol d, MOD    63.4 ml A4C: LV vol s, MOD    22.4 ml A2C: LV vol s, MOD    25.5 ml A4C: LV SV MOD A2C:   33.8 ml LV SV MOD A4C:   63.4 ml LV SV MOD BP:    38.8 ml RIGHT VENTRICLE RV Basal diam:  3.90 cm RV Mid diam:    2.70 cm RV S prime:     19.60 cm/s TAPSE (M-mode): 2.0 cm LEFT ATRIUM             Index        RIGHT ATRIUM           Index LA Vol (A2C):   92.7 ml 57.09 ml/m  RA Area:     16.20 cm LA Vol (A4C):   36.6 ml 22.54 ml/m  RA Volume:   44.50 ml  27.41 ml/m LA Biplane Vol: 58.1 ml 35.78 ml/m  AORTIC VALVE AV Area (Vmax):    2.04 cm AV Area (Vmean):   2.00 cm AV Area (VTI):     1.92 cm AV Vmax:           123.00 cm/s AV Vmean:          84.100 cm/s AV VTI:            0.263 m AV Peak Grad:      6.1 mmHg AV Mean Grad:      3.0 mmHg LVOT Vmax:         79.70 cm/s LVOT Vmean:        53.500 cm/s LVOT VTI:          0.161 m LVOT/AV VTI ratio: 0.61  AORTA Ao Sinus diam: 3.00 cm Ao STJ diam:   2.3 cm Ao Asc diam:   2.70 cm MITRAL VALVE               TRICUSPID VALVE MV Area (PHT): 3.91 cm    TR Peak grad:   36.5 mmHg MV Decel Time: 194 msec    TR Vmax:        302.00 cm/s MV E velocity: 61.30 cm/s MV A velocity: 85.90 cm/s  SHUNTS MV E/A ratio:  0.71        Systemic VTI:  0.16 m                            Systemic Diam: 2.00 cm Redell Cave MD Electronically signed by Redell Cave MD Signature Date/Time: 10/18/2023/2:18:05 PM    Final     ASSESSMENT: Iron  deficiency anemia.  PLAN:    Iron  deficiency anemia: Patient's hemoglobin continues to be decreased, but her iron  stores are now within normal limits.  All of her laboratory work is also either negative or within  normal limits.  [facility] myeloid panel is pending at time of  dictation. Her last colonoscopy was in 2016.  Patient takes oral iron  supplementation and is tolerating it well.  Despite her persistent anemia, patient does not require additional IV Venofer  today.  She last received treatment on August 05, 2023.  No intervention is needed.  Return to clinic in 4 months with repeat laboratory, further evaluation, and continuation of treatment if necessary.    I spent a total of 20 minutes reviewing chart data, face-to-face evaluation with the patient, counseling and coordination of care as detailed above.   Patient expressed understanding and was in agreement with this plan. She also understands that She can call clinic at any time with any questions, concerns, or complaints.    Evalene JINNY Reusing, MD   11/16/2023 11:34 AM

## 2023-11-16 NOTE — Telephone Encounter (Signed)
 Order placed for urology referral.

## 2023-11-17 LAB — HAPTOGLOBIN: Haptoglobin: 319 mg/dL (ref 41–333)

## 2023-11-18 ENCOUNTER — Encounter: Payer: Self-pay | Admitting: Oncology

## 2023-11-24 ENCOUNTER — Other Ambulatory Visit: Payer: Self-pay

## 2023-11-24 ENCOUNTER — Encounter: Payer: Self-pay | Admitting: Internal Medicine

## 2023-11-24 ENCOUNTER — Telehealth: Payer: Self-pay | Admitting: Internal Medicine

## 2023-11-24 DIAGNOSIS — R350 Frequency of micturition: Secondary | ICD-10-CM

## 2023-11-24 DIAGNOSIS — R300 Dysuria: Secondary | ICD-10-CM

## 2023-11-24 DIAGNOSIS — R399 Unspecified symptoms and signs involving the genitourinary system: Secondary | ICD-10-CM

## 2023-11-24 NOTE — Telephone Encounter (Signed)
 Pt spouse came in to pick up collection cup and hat this afternoon. Unable to addend previous note -[facility]

## 2023-11-24 NOTE — Telephone Encounter (Signed)
 Per our discussion, I really need a urine to confirm if infection present. Can she wear a depends and try to come in to that we can check her urine and evaluate.

## 2023-11-24 NOTE — Telephone Encounter (Signed)
 Pt was seen on 10/26/23 last urine check was 11/14/23. Okay for pt to drop urine off or does pt need to come in for re-eval?

## 2023-11-24 NOTE — Telephone Encounter (Signed)
 Spoke to pt's son. In agreement for pt to be seen and to collect a urine

## 2023-11-24 NOTE — Telephone Encounter (Signed)
 See if she can come in tomorrow at 1:30 for appt and for evaluation.

## 2023-11-24 NOTE — Telephone Encounter (Signed)
 Spoke to pt. Pt stated that she is unable to come in due to using the restroom on herself. Explained to pt that pcp wanted her to coe in for eval pt declined said she just wants to know what to do. Tried to explain to pt that she would have to be seen and a urine be recollected pt cut me off and said she can't come in and just wants to know what to do

## 2023-11-25 ENCOUNTER — Encounter: Payer: Self-pay | Admitting: Internal Medicine

## 2023-11-25 ENCOUNTER — Telehealth: Admitting: Internal Medicine

## 2023-11-25 VITALS — Ht 64.0 in | Wt 130.0 lb

## 2023-11-25 DIAGNOSIS — R739 Hyperglycemia, unspecified: Secondary | ICD-10-CM | POA: Diagnosis not present

## 2023-11-25 DIAGNOSIS — R350 Frequency of micturition: Secondary | ICD-10-CM

## 2023-11-25 DIAGNOSIS — D649 Anemia, unspecified: Secondary | ICD-10-CM

## 2023-11-25 DIAGNOSIS — N183 Chronic kidney disease, stage 3 unspecified: Secondary | ICD-10-CM | POA: Diagnosis not present

## 2023-11-25 DIAGNOSIS — R399 Unspecified symptoms and signs involving the genitourinary system: Secondary | ICD-10-CM

## 2023-11-25 DIAGNOSIS — I10 Essential (primary) hypertension: Secondary | ICD-10-CM | POA: Diagnosis not present

## 2023-11-25 DIAGNOSIS — J449 Chronic obstructive pulmonary disease, unspecified: Secondary | ICD-10-CM

## 2023-11-25 DIAGNOSIS — I272 Pulmonary hypertension, unspecified: Secondary | ICD-10-CM | POA: Diagnosis not present

## 2023-11-25 DIAGNOSIS — K2270 Barrett's esophagus without dysplasia: Secondary | ICD-10-CM | POA: Diagnosis not present

## 2023-11-25 DIAGNOSIS — R300 Dysuria: Secondary | ICD-10-CM | POA: Diagnosis not present

## 2023-11-25 DIAGNOSIS — E7800 Pure hypercholesterolemia, unspecified: Secondary | ICD-10-CM | POA: Diagnosis not present

## 2023-11-25 LAB — POC URINALSYSI DIPSTICK (AUTOMATED)
Bilirubin, UA: NEGATIVE
Glucose, UA: NEGATIVE
Ketones, UA: NEGATIVE
Nitrite, UA: NEGATIVE
Protein, UA: POSITIVE — AB
Spec Grav, UA: 1.02 (ref 1.010–1.025)
Urobilinogen, UA: 0.2 U/dL
pH, UA: 6 (ref 5.0–8.0)

## 2023-11-25 LAB — URINALYSIS, ROUTINE W REFLEX MICROSCOPIC
Bilirubin Urine: NEGATIVE
Ketones, ur: NEGATIVE
Nitrite: NEGATIVE
Specific Gravity, Urine: 1.015 (ref 1.000–1.030)
Total Protein, Urine: >=300 — AB
Urine Glucose: NEGATIVE
Urobilinogen, UA: 0.2 (ref 0.0–1.0)
pH: 6 (ref 5.0–8.0)

## 2023-11-25 MED ORDER — NYSTATIN 100000 UNIT/GM EX CREA: 1.0000 | TOPICAL_CREAM | Freq: Two times a day (BID) | CUTANEOUS | 0 refills | Status: DC

## 2023-11-25 MED ORDER — CIPROFLOXACIN HCL 250 MG PO TABS: 250.0000 mg | ORAL_TABLET | Freq: Two times a day (BID) | ORAL | 0 refills | Status: DC

## 2023-11-25 NOTE — Telephone Encounter (Signed)
 Do they have a sterile cup to collect a urine? Will need to come in for cup if not.

## 2023-11-25 NOTE — Progress Notes (Signed)
 Patient ID: Anita Ferrell, female   DOB: 09-15-1932, 89 y.o.   MRN: [id number]   Virtual Visit via video Note  I connected with Persais  Stotler by a video enabled telemedicine application and verified that I am speaking with the correct person using two identifiers. Location patient: home Location provider: work  Persons participating in the virtual visit: patient, provider  The limitations, risks, security and privacy concerns of performing an evaluation and management service by video and the availability of in person appointments have been discussed. It has also been discussed with the patient that there may be a patient responsible charge related to this service. The patient expressed understanding and agreed to proceed.   Reason for visit: work in appt  HPI: Work in appt with concerns regarding possible UTI. Just recently treated for UTI. Symptoms improved/resolved. Over the last 2-3 days, noticed increased urinary frequency and dysuria. No hematuria. No fever. No vomiting or nausea. Describes end urination discomfort. Had diarrhea a couple of days ago. Took kaopectate. Normal bowel movement yesterday and soft tool this am. Reports irritation - bottom. Skin irritation. She reports that she stopped ohytuvare because she thought that it was contributing to her urinary symptoms. Has been off. Still with urinary issues. Discuss with Dr Tamea regarding restarting. Felt was helping her breathing.    ROS: See pertinent positives and negatives per HPI.  Past Medical History:  Diagnosis Date   A-fib (HCC)    Allergy    Asthma    CHF (congestive heart failure) (HCC)    COPD (chronic obstructive pulmonary disease) (HCC)    GERD (gastroesophageal reflux disease)    Hypercholesteremia    Hypertension    Mitral valve insufficiency    Pulmonary hypertension (HCC)    Right Ventricular systolic pressure at 60 mm Hg by echo on july of 2011; Right heart cardic catherization revealed  pulmonary artery pressusre of 27/10 with a mean of; Ventricular pressure 29/10 with pulmonary capillary wedge at 9   TB (pulmonary tuberculosis)    treated with INH therapy     Past Surgical History:  Procedure Laterality Date   CARDIAC CATHETERIZATION  10/2016   [facility]   CATARACT EXTRACTION Bilateral 2008   CHOLECYSTECTOMY     ORIF WRIST FRACTURE Right 09/24/2021   Procedure: OPEN REDUCTION INTERNAL FIXATION OF RIGHT DISTAL RADIUS FRACTURE;  Surgeon: Kathlynn Sharper, MD;  Location: [facility] ORS;  Service: Orthopedics;  Laterality: Right;   TONSILLECTOMY  1964    Family History  Problem Relation Age of Onset   Heart disease Mother    Heart disease Father    Hypertension Sister    Heart disease Brother    Breast cancer Maternal Aunt     SOCIAL HX: reviewed.    Current Outpatient Medications:    acetaminophen  (TYLENOL ) 500 MG tablet, Take 500 mg by mouth as needed. , Disp: , Rfl:    albuterol  (PROVENTIL ) (2.5 MG/3ML) 0.083% nebulizer solution, INHALE CONTENTS 1 VIAL VIA NEBULIZER EVERY 6 HOURS AS NEEDED WHEEZE/SHORTNESS OF BREATH J44.9, Disp: 375 mL, Rfl: 5   albuterol  (VENTOLIN  HFA) 108 (90 Base) MCG/ACT inhaler, Inhale 1-2 puffs into the lungs every 6 (six) hours as needed for wheezing or shortness of breath., Disp: 18 g, Rfl: 5   aspirin  EC 81 MG tablet, Take 81 mg by mouth daily., Disp: , Rfl:    azelastine  (ASTELIN ) 0.1 % nasal spray, Use in each nostril as directed, Disp: 30 mL, Rfl: 5   Calcium  Carbonate-Vitamin D  600-400 MG-UNIT  tablet, Take 1 tablet by mouth at bedtime., Disp: , Rfl:    ciprofloxacin  (CIPRO ) 250 MG tablet, Take 1 tablet (250 mg total) by mouth 2 (two) times daily., Disp: 10 tablet, Rfl: 0   famotidine (PEPCID) 20 MG tablet, Take 20 mg by mouth daily as needed for heartburn or indigestion., Disp: , Rfl:    ferrous sulfate  325 (65 FE) MG tablet, Take 325 mg by mouth 2 (two) times daily with a meal. , Disp: , Rfl:    fexofenadine (ALLEGRA) 180 MG tablet, Take 180 mg  by mouth daily., Disp: , Rfl:    Fluticasone -Umeclidin-Vilant (TRELEGY ELLIPTA ) 100-62.5-25 MCG/ACT AEPB, INHALE 1 PUFF BY MOUTH EVERY DAY, Disp: 60 each, Rfl: 11   lansoprazole (PREVACID) 30 MG capsule, Take 30 mg by mouth daily., Disp: , Rfl:    meclizine (ANTIVERT) 25 MG tablet, Take 25 mg by mouth as needed for dizziness., Disp: , Rfl:    metoprolol  tartrate (LOPRESSOR ) 25 MG tablet, TAKE 1 TABLET BY MOUTH TWICE A DAY, Disp: 180 tablet, Rfl: 3   Multiple Vitamins-Minerals (CENTRUM SILVER PO), Take by mouth., Disp: , Rfl:    OXYGEN , Inhale 2 L into the lungs at bedtime., Disp: , Rfl:    Probiotic Product (ALIGN PO), Take by mouth., Disp: , Rfl:    rosuvastatin  (CRESTOR ) 10 MG tablet, Take 1 tablet (10 mg total) by mouth daily., Disp: 90 tablet, Rfl: 3   spironolactone  (ALDACTONE ) 25 MG tablet, Take 0.5 tablets (12.5 mg total) by mouth daily., Disp: 45 tablet, Rfl: 3   Ensifentrine  (OHTUVAYRE ) 3 MG/2.5ML SUSP, Inhale into the lungs 2 (two) times daily. (Patient not taking: Reported on 11/25/2023), Disp: , Rfl:    furosemide  (LASIX ) 20 MG tablet, TAKE 1 TABLET BY MOUTH EVERY DAY (Patient not taking: Reported on 11/25/2023), Disp: 90 tablet, Rfl: 3   nystatin  cream (MYCOSTATIN ), Apply 1 Application topically 2 (two) times daily., Disp: 30 g, Rfl: 0  EXAM:  GENERAL: alert, oriented, appears well and in no acute distress  HEENT: atraumatic, conjunttiva clear, no obvious abnormalities on inspection of external nose and ears  NECK: normal movements of the head and neck  LUNGS: on inspection no signs of respiratory distress, breathing rate appears normal, no obvious gross SOB, gasping or wheezing  CV: no obvious cyanosis  PSYCH/NEURO: pleasant and cooperative, no obvious depression or anxiety, speech and thought processing grossly intact  ASSESSMENT AND PLAN:  Discussed the following assessment and plan:  Problem List Items Addressed This Visit     UTI symptoms   Treat infection as  outlined.       Urinary frequency   Urinary frequency and dysuria and end urination discomfort. Urine dip - positive leukocytes. Appears to be c/w UTI. Syptoms c/w UTI. Treat with cipro  250mg  as outlined. Probiotics as directed. Await urine culture. Follow. Urology referral discussed.       Pulmonary HTN (HCC)   Continues trelegy and oxygen  at night. Known pulmonary hypertension. ECHO - 10/18/23 - EF 60-65%, G1DD, mild LA enlargement and mild MR.  Also PFTs planned. Continue nebulized albuterol  bid. Pulmonary started her on ohtuvayre . She stopped the medication given concern that it was causing her urinary issues. Still having urinary issues. D/w Dr Tamea regarding resarting.       Hyperglycemia   Follow met b and A1c.       Hypercholesterolemia   Continue crestor . Follow lipid panel       Essential hypertension   Continue aldactone  and metoprolol . Follow  pressures. No changes in medication.       COPD, moderate (HCC)   Continues trelegy and oxygen  at night. Breathing stable.       CKD (chronic kidney disease) stage 3, GFR 30-59 ml/min (HCC)   Has been evaluated by Dr Dennise. Continue to avoid antiinflammatory medication. Follow metabolic panel.       Barrett's esophagus   No upper symptoms. On prevacid.       Anemia - Primary   Has been evalauted by hematology previously. Follow cbc. Has declined further w/up.       Other Visit Diagnoses       Burning with urination           Return if symptoms worsen or fail to improve.   I discussed the assessment and treatment plan with the patient. The patient was provided an opportunity to ask questions and all were answered. The patient agreed with the plan and demonstrated an understanding of the instructions.   The patient was advised to call back or seek an in-person evaluation if the symptoms worsen or if the condition fails to improve as anticipated.    Allena Hamilton, MD

## 2023-11-26 ENCOUNTER — Ambulatory Visit: Payer: Self-pay | Admitting: Internal Medicine

## 2023-11-27 ENCOUNTER — Encounter: Payer: Self-pay | Admitting: Internal Medicine

## 2023-11-27 LAB — URINE CULTURE
MICRO NUMBER:: 16806441
SPECIMEN QUALITY:: ADEQUATE

## 2023-11-27 NOTE — Assessment & Plan Note (Signed)
 Has been evaluated by Dr Thedore Mins. Continue to avoid antiinflammatory medication. Follow metabolic panel.

## 2023-11-27 NOTE — Assessment & Plan Note (Signed)
 Continue crestor . Follow lipid panel.

## 2023-11-27 NOTE — Assessment & Plan Note (Signed)
 Urinary frequency and dysuria and end urination discomfort. Urine dip - positive leukocytes. Appears to be c/w UTI. Syptoms c/w UTI. Treat with cipro  250mg  as outlined. Probiotics as directed. Await urine culture. Follow. Urology referral discussed.

## 2023-11-27 NOTE — Assessment & Plan Note (Signed)
 Continues trelegy and oxygen  at night. Known pulmonary hypertension. ECHO - 10/18/23 - EF 60-65%, G1DD, mild LA enlargement and mild MR.  Also PFTs planned. Continue nebulized albuterol  bid. Pulmonary started her on ohtuvayre . She stopped the medication given concern that it was causing her urinary issues. Still having urinary issues. D/w Dr Tamea regarding resarting.

## 2023-11-27 NOTE — Assessment & Plan Note (Signed)
No upper symptoms.  On prevacid.  

## 2023-11-27 NOTE — Assessment & Plan Note (Signed)
 Has been evalauted by hematology previously. Follow cbc. Has declined further w/up.

## 2023-11-27 NOTE — Assessment & Plan Note (Signed)
 Continue aldactone  and metoprolol . Follow pressures. No changes in medication.

## 2023-11-27 NOTE — Assessment & Plan Note (Signed)
 Follow met b and A1c.

## 2023-11-27 NOTE — Assessment & Plan Note (Signed)
 Continues trelegy and oxygen  at night. Breathing stable.

## 2023-11-27 NOTE — Assessment & Plan Note (Signed)
 Treat infection as outlined.

## 2023-11-29 ENCOUNTER — Other Ambulatory Visit
Admission: RE | Admit: 2023-11-29 | Discharge: 2023-11-29 | Disposition: A | Discharge status: Home / Self Care | Attending: Internal Medicine | Admitting: Internal Medicine

## 2023-11-29 DIAGNOSIS — E875 Hyperkalemia: Secondary | ICD-10-CM | POA: Diagnosis not present

## 2023-11-29 LAB — POTASSIUM: Potassium: 5 mmol/L (ref 3.5–5.1)

## 2023-11-30 ENCOUNTER — Ambulatory Visit: Payer: Self-pay | Admitting: Internal Medicine

## 2023-12-06 DIAGNOSIS — M810 Age-related osteoporosis without current pathological fracture: Secondary | ICD-10-CM | POA: Diagnosis not present

## 2023-12-12 ENCOUNTER — Telehealth: Payer: Self-pay

## 2023-12-12 NOTE — Telephone Encounter (Signed)
 Pts son Alm left message on triage line requesting a sterile cup and hat to bring in a sample for her appt on Wed.   Advised pt that we do not use hats as it may contaminate the specimen. She can void when she arrives.  Pt states she has difficulty getting up and down off the toilet and will not be able to void at her visit.   I advised pt that we could do a in/out cath to get  a specimen. She was ok with this plan.   Noted of appt desk. Will send to [facility] for awareness.

## 2023-12-14 ENCOUNTER — Ambulatory Visit: Admitting: Urology

## 2023-12-16 LAB — INTELLIGEN MYELOID

## 2023-12-27 ENCOUNTER — Encounter: Payer: Self-pay | Admitting: Internal Medicine

## 2023-12-27 ENCOUNTER — Ambulatory Visit (INDEPENDENT_AMBULATORY_CARE_PROVIDER_SITE_OTHER): Admitting: Physician Assistant

## 2023-12-27 VITALS — BP 138/72 | HR 92

## 2023-12-27 DIAGNOSIS — N200 Calculus of kidney: Secondary | ICD-10-CM | POA: Diagnosis not present

## 2023-12-27 DIAGNOSIS — R319 Hematuria, unspecified: Secondary | ICD-10-CM | POA: Diagnosis not present

## 2023-12-27 DIAGNOSIS — R3915 Urgency of urination: Secondary | ICD-10-CM | POA: Diagnosis not present

## 2023-12-27 DIAGNOSIS — N958 Other specified menopausal and perimenopausal disorders: Secondary | ICD-10-CM | POA: Diagnosis not present

## 2023-12-27 DIAGNOSIS — N390 Urinary tract infection, site not specified: Secondary | ICD-10-CM | POA: Diagnosis not present

## 2023-12-27 DIAGNOSIS — R3121 Asymptomatic microscopic hematuria: Secondary | ICD-10-CM | POA: Diagnosis not present

## 2023-12-27 LAB — URINALYSIS, COMPLETE
Bilirubin, UA: NEGATIVE
Glucose, UA: NEGATIVE
Ketones, UA: NEGATIVE
Nitrite, UA: NEGATIVE
Protein,UA: NEGATIVE
Specific Gravity, UA: 1.01 (ref 1.005–1.030)
Urobilinogen, Ur: 0.2 mg/dL (ref 0.2–1.0)
pH, UA: 6.5 (ref 5.0–7.5)

## 2023-12-27 LAB — MICROSCOPIC EXAMINATION

## 2023-12-27 MED ORDER — ESTRADIOL 0.1 MG/GM VA CREA
TOPICAL_CREAM | VAGINAL | 12 refills | Status: AC
Start: 2023-12-27 — End: ?

## 2023-12-27 MED ORDER — GEMTESA 75 MG PO TABS: 75.0000 mg | ORAL_TABLET | Freq: Every day | ORAL | Status: DC

## 2023-12-27 NOTE — Progress Notes (Signed)
 In and Out Catheterization  Patient is present today for a I & O catheterization due to uti syptoms. Patient was cleaned and prepped in a sterile fashion with betadine . A 14FR cath was inserted no complications were noted , 50 ml of urine return was noted, urine was slightly cloudy in color. A clean urine sample was collected for UA. Bladder was drained  And catheter was removed with out difficulty.    Performed by: Choya Tornow Magallon-Marich, RMA

## 2023-12-27 NOTE — Patient Instructions (Signed)
 The imaging department will call you to schedule your CT scan. I will call you with your results when I receive them. Start Gemtesa  once daily for urgency/frequency/urinary leakage. Start topical vaginal estrogen cream. Apply a pea-sized amount around the opening of the urethra every day for 2 weeks, then every Monday, Wednesday, and Friday forever.

## 2023-12-27 NOTE — Progress Notes (Signed)
 12/27/2023 12:49 PM   Anita Ferrell April 21, 1932 [id number]  CC: Chief Complaint  Patient presents with   Establish Care   Hematuria   HPI: Anita  F Ferrell is a 89 y.o. female with PMH CKD, asymptomatic microscopic hematuria previously managed by Dr. Ike, and nephrolithiasis who presents today to reestablish care in the setting of microscopic hematuria and recent UTIs.  She is accompanied today by her son, Anita Ferrell, who contributes to HPI.  She saw Dr. Francisca in 2022 regarding her microscopic hematuria.  Renal ultrasound showed a 13 mm nonobstructing left renal stone and 2 left renal cysts.  Follow-up KUB was recommended, however she was subsequently lost to follow-up due to other medical problems and a move.  Today she reports a recent history of recurrent versus persistent UTI starting in June and managed by Dr. Glendia.  She has had 3 urine cultures since onset, and 2 of them grew Proteus mirabilis.  She has been treated with fosfomycin and 2 courses of Cipro .  She denies dysuria today, but has some vulvovaginal irritation, which is chronic.  She reports that since her recent UTIs, she has developed urinary frequency every 90 minutes to 2 hours, nocturia x 2-3, urgency, and urge incontinence.  She is wearing 3-4 depends daily.  This is rather new this year.  In-office catheterized UA today positive for 1+ blood and trace leukocytes; urine microscopy with 6-10 WBCs/HPF and 11-30 RBCs/HPF.  PMH: Past Medical History:  Diagnosis Date   A-fib (HCC)    Allergy    Asthma    CHF (congestive heart failure) (HCC)    COPD (chronic obstructive pulmonary disease) (HCC)    GERD (gastroesophageal reflux disease)    Hypercholesteremia    Hypertension    Mitral valve insufficiency    Pulmonary hypertension (HCC)    Right Ventricular systolic pressure at 60 mm Hg by echo on july of 2011; Right heart cardic catherization revealed pulmonary artery pressusre of 27/10 with a mean of;  Ventricular pressure 29/10 with pulmonary capillary wedge at 9   TB (pulmonary tuberculosis)    treated with INH therapy     Surgical History: Past Surgical History:  Procedure Laterality Date   CARDIAC CATHETERIZATION  10/2016   [facility]   CATARACT EXTRACTION Bilateral 2008   CHOLECYSTECTOMY     ORIF WRIST FRACTURE Right 09/24/2021   Procedure: OPEN REDUCTION INTERNAL FIXATION OF RIGHT DISTAL RADIUS FRACTURE;  Surgeon: Kathlynn Sharper, MD;  Location: [facility] ORS;  Service: Orthopedics;  Laterality: Right;   TONSILLECTOMY  1964    Home Medications:  Allergies as of 12/27/2023       Reactions   Penicillin G Swelling   Penicillins Swelling, Other (See Comments)   Has patient had a PCN reaction causing immediate rash, facial/tongue/throat swelling, SOB or lightheadedness with hypotension: Yes Has patient had a PCN reaction causing severe rash involving mucus membranes or skin necrosis: No Has patient had a PCN reaction that required hospitalization No Has patient had a PCN reaction occurring within the last 10 years: No If all of the above answers are NO, then may proceed with Cephalosporin use.        Medication List        Accurate as of December 27, 2023 12:49 PM. If you have any questions, ask your nurse or doctor.          acetaminophen  500 MG tablet Commonly known as: TYLENOL  Take 500 mg by mouth as needed.   albuterol  (2.5 MG/3ML) 0.083%  nebulizer solution Commonly known as: PROVENTIL  INHALE CONTENTS 1 VIAL VIA NEBULIZER EVERY 6 HOURS AS NEEDED WHEEZE/SHORTNESS OF BREATH J44.9   albuterol  108 (90 Base) MCG/ACT inhaler Commonly known as: Ventolin  HFA Inhale 1-2 puffs into the lungs every 6 (six) hours as needed for wheezing or shortness of breath.   ALIGN PO Take by mouth.   aspirin  EC 81 MG tablet Take 81 mg by mouth daily.   azelastine  0.1 % nasal spray Commonly known as: ASTELIN  Use in each nostril as directed   Calcium  Carbonate-Vitamin D  600-400 MG-UNIT  tablet Take 1 tablet by mouth at bedtime.   CENTRUM SILVER PO Take by mouth.   ciprofloxacin  250 MG tablet Commonly known as: Cipro  Take 1 tablet (250 mg total) by mouth 2 (two) times daily.   estradiol  0.1 MG/GM vaginal cream Commonly known as: ESTRACE  Apply one pea-sized amount around the opening of the urethra daily for 2 weeks, then 3 times weekly moving forward.   famotidine 20 MG tablet Commonly known as: PEPCID Take 20 mg by mouth daily as needed for heartburn or indigestion.   ferrous sulfate  325 (65 FE) MG tablet Take 325 mg by mouth 2 (two) times daily with a meal.   fexofenadine 180 MG tablet Commonly known as: ALLEGRA Take 180 mg by mouth daily.   furosemide  20 MG tablet Commonly known as: LASIX  TAKE 1 TABLET BY MOUTH EVERY DAY   Gemtesa  75 MG Tabs Generic drug: Vibegron  Take 1 tablet (75 mg total) by mouth daily.   lansoprazole 30 MG capsule Commonly known as: PREVACID Take 30 mg by mouth daily.   meclizine 25 MG tablet Commonly known as: ANTIVERT Take 25 mg by mouth as needed for dizziness.   metoprolol  tartrate 25 MG tablet Commonly known as: LOPRESSOR  TAKE 1 TABLET BY MOUTH TWICE A DAY   nystatin  cream Commonly known as: MYCOSTATIN  Apply 1 Application topically 2 (two) times daily.   Ohtuvayre  3 MG/2.5ML Susp Generic drug: Ensifentrine  Inhale into the lungs 2 (two) times daily.   OXYGEN  Inhale 2 L into the lungs at bedtime.   rosuvastatin  10 MG tablet Commonly known as: CRESTOR  Take 1 tablet (10 mg total) by mouth daily.   spironolactone  25 MG tablet Commonly known as: ALDACTONE  Take 0.5 tablets (12.5 mg total) by mouth daily.   Trelegy Ellipta  100-62.5-25 MCG/ACT Aepb Generic drug: Fluticasone -Umeclidin-Vilant INHALE 1 PUFF BY MOUTH EVERY DAY        Allergies:  Allergies  Allergen Reactions   Penicillin G Swelling   Penicillins Swelling and Other (See Comments)    Has patient had a PCN reaction causing immediate rash,  facial/tongue/throat swelling, SOB or lightheadedness with hypotension: Yes Has patient had a PCN reaction causing severe rash involving mucus membranes or skin necrosis: No Has patient had a PCN reaction that required hospitalization No Has patient had a PCN reaction occurring within the last 10 years: No If all of the above answers are NO, then may proceed with Cephalosporin use.    Family History: Family History  Problem Relation Age of Onset   Heart disease Mother    Heart disease Father    Hypertension Sister    Heart disease Brother    Breast cancer Maternal Aunt     Social History:   reports that she quit smoking about 33 years ago. Her smoking use included cigarettes. She started smoking about 73 years ago. She has a 40 pack-year smoking history. She has never used smokeless tobacco. She reports that  she does not drink alcohol and does not use drugs.  Physical Exam: BP 138/72   Pulse 92   Constitutional:  Alert and oriented, no acute distress, nontoxic appearing HEENT: [place], AT Cardiovascular: No clubbing, cyanosis, or edema Respiratory: Normal respiratory effort, no increased work of breathing Skin: No rashes, bruises or suspicious lesions Neurologic: Grossly intact, no focal deficits, moving all 4 extremities Psychiatric: Normal mood and affect  Laboratory Data: Results for orders placed or performed in visit on 12/27/23  Microscopic Examination   Collection Time: 12/27/23 11:48 AM   Urine  Result Value Ref Range   WBC, UA 6-10 (A) 0 - 5 /hpf   RBC, Urine 11-30 (A) 0 - 2 /hpf   Epithelial Cells (non renal) 0-10 0 - 10 /hpf   Bacteria, UA Few None seen/Few  Urinalysis, Complete   Collection Time: 12/27/23 11:48 AM  Result Value Ref Range   Specific Gravity, UA 1.010 1.005 - 1.030   pH, UA 6.5 5.0 - 7.5   Color, UA Yellow Yellow   Appearance Ur Clear Clear   Leukocytes,UA Trace (A) Negative   Protein,UA Negative Negative/Trace   Glucose, UA Negative Negative    Ketones, UA Negative Negative   RBC, UA 1+ (A) Negative   Bilirubin, UA Negative Negative   Urobilinogen, Ur 0.2 0.2 - 1.0 mg/dL   Nitrite, UA Negative Negative   Microscopic Examination See below:    Assessment & Plan:   1. Asymptomatic microscopic hematuria (Primary) Persistent microscopic hematuria on cath UA today.  This appears rather stable and in the absence of new gross hematuria, I think we can continue to defer cystoscopy for now.  Will continue to monitor.  We discussed that her renal stone could be contributory. - Urinalysis, Complete  2. Recurrent UTI Recent history of recurrent versus persistent Proteus UTI.  With a known left renal stone, will get CT stone study to further evaluate her stone burden.  She is not clinically infected today and UA is not terribly positive.  Will start topical vaginal estrogen cream for UTI prevention.  I showed her where to apply it using anatomy diagram. - CT RENAL STONE STUDY; Future - estradiol  (ESTRACE ) 0.1 MG/GM vaginal cream; Apply one pea-sized amount around the opening of the urethra daily for 2 weeks, then 3 times weekly moving forward.  Dispense: 42.5 g; Refill: 12 - CULTURE, URINE COMPREHENSIVE  3. Calculus of kidney Known left renal stone, now with recurrent versus persistent Proteus UTI.  Will obtain CT stone study to definitively evaluate her stone burden.  May consider definitive stone management based on clinical course and results. - CT RENAL STONE STUDY; Future  4. Genitourinary syndrome of menopause With recurrent versus persistent UTI, new OAB symptoms, and vulvovaginal irritation, will start topical vaginal estrogen cream. - estradiol  (ESTRACE ) 0.1 MG/GM vaginal cream; Apply one pea-sized amount around the opening of the urethra daily for 2 weeks, then 3 times weekly moving forward.  Dispense: 42.5 g; Refill: 12  5. Urinary urgency New OAB symptoms since recurrent versus persistent UTI this year.  We discussed that her  symptoms may resolve on their own, but in the interim we will give her Gemtesa  for symptom control. - Vibegron  (GEMTESA ) 75 MG TABS; Take 1 tablet (75 mg total) by mouth daily.  Return for Will call with results.  Lucie Hones, PA-C  [facility]  [address], [address] [place], [place] [address] [phone]

## 2023-12-29 ENCOUNTER — Encounter: Payer: Self-pay | Admitting: Internal Medicine

## 2024-01-03 ENCOUNTER — Ambulatory Visit
Admission: RE | Admit: 2024-01-03 | Discharge: 2024-01-03 | Disposition: A | Discharge status: Home / Self Care | Source: Ambulatory Visit | Attending: Physician Assistant | Admitting: Physician Assistant

## 2024-01-03 DIAGNOSIS — K5730 Diverticulosis of large intestine without perforation or abscess without bleeding: Secondary | ICD-10-CM | POA: Diagnosis not present

## 2024-01-03 DIAGNOSIS — N390 Urinary tract infection, site not specified: Secondary | ICD-10-CM | POA: Insufficient documentation

## 2024-01-03 DIAGNOSIS — N200 Calculus of kidney: Secondary | ICD-10-CM | POA: Insufficient documentation

## 2024-01-03 DIAGNOSIS — R161 Splenomegaly, not elsewhere classified: Secondary | ICD-10-CM | POA: Diagnosis not present

## 2024-01-03 DIAGNOSIS — K769 Liver disease, unspecified: Secondary | ICD-10-CM | POA: Diagnosis not present

## 2024-01-03 LAB — CULTURE, URINE COMPREHENSIVE

## 2024-01-06 ENCOUNTER — Ambulatory Visit: Payer: Self-pay | Admitting: Physician Assistant

## 2024-01-06 ENCOUNTER — Other Ambulatory Visit: Payer: Self-pay | Admitting: *Deleted

## 2024-01-06 DIAGNOSIS — R161 Splenomegaly, not elsewhere classified: Secondary | ICD-10-CM

## 2024-01-06 DIAGNOSIS — R591 Generalized enlarged lymph nodes: Secondary | ICD-10-CM

## 2024-01-06 NOTE — Progress Notes (Signed)
 I spoke with the patient's son, Anita Ferrell, via telephone this afternoon to discuss her CT scan results.  We discussed that she has a partial left staghorn stone with no evidence of active obstruction.  Her urine culture with me last week was negative, and she is currently asymptomatic of UTI.  Additionally, she has new findings in the spleen, liver, and lymph nodes that warrant further evaluation by heme-onc.  I have notified Dr. Jacobo, as she is already a patient of his.  I recommended deferring management of her staghorn stone in light of workup with Dr. Jacobo, especially given her splenomegaly and its proximity to her left kidney.  Anita Ferrell agrees.  Will plan to observe her staghorn stone for now.  Please schedule her for follow-up with us  in 3 months with UA and KUB prior.  If she develops frequent UTIs, we will keep a low threshold to start suppressive antibiotics.

## 2024-01-09 ENCOUNTER — Other Ambulatory Visit: Payer: Self-pay | Admitting: *Deleted

## 2024-01-09 ENCOUNTER — Other Ambulatory Visit: Payer: Self-pay

## 2024-01-09 DIAGNOSIS — K769 Liver disease, unspecified: Secondary | ICD-10-CM

## 2024-01-09 DIAGNOSIS — N200 Calculus of kidney: Secondary | ICD-10-CM

## 2024-01-09 DIAGNOSIS — D479 Neoplasm of uncertain behavior of lymphoid, hematopoietic and related tissue, unspecified: Secondary | ICD-10-CM

## 2024-01-09 DIAGNOSIS — N390 Urinary tract infection, site not specified: Secondary | ICD-10-CM

## 2024-01-20 ENCOUNTER — Inpatient Hospital Stay: Attending: Oncology

## 2024-01-20 ENCOUNTER — Ambulatory Visit
Admission: RE | Admit: 2024-01-20 | Discharge: 2024-01-20 | Disposition: A | Discharge status: Home / Self Care | Source: Ambulatory Visit | Attending: Oncology | Admitting: Oncology

## 2024-01-20 ENCOUNTER — Other Ambulatory Visit

## 2024-01-20 DIAGNOSIS — R161 Splenomegaly, not elsewhere classified: Secondary | ICD-10-CM | POA: Diagnosis not present

## 2024-01-20 DIAGNOSIS — D509 Iron deficiency anemia, unspecified: Secondary | ICD-10-CM | POA: Diagnosis not present

## 2024-01-20 DIAGNOSIS — D479 Neoplasm of uncertain behavior of lymphoid, hematopoietic and related tissue, unspecified: Secondary | ICD-10-CM | POA: Insufficient documentation

## 2024-01-20 DIAGNOSIS — K769 Liver disease, unspecified: Secondary | ICD-10-CM | POA: Insufficient documentation

## 2024-01-20 DIAGNOSIS — R591 Generalized enlarged lymph nodes: Secondary | ICD-10-CM

## 2024-01-20 DIAGNOSIS — Z87891 Personal history of nicotine dependence: Secondary | ICD-10-CM | POA: Insufficient documentation

## 2024-01-20 DIAGNOSIS — R911 Solitary pulmonary nodule: Secondary | ICD-10-CM | POA: Insufficient documentation

## 2024-01-20 LAB — IRON AND TIBC
Iron: 24 ug/dL — ABNORMAL LOW (ref 28–170)
Saturation Ratios: 8 % — ABNORMAL LOW (ref 10.4–31.8)
TIBC: 290 ug/dL (ref 250–450)
UIBC: 266 ug/dL

## 2024-01-20 LAB — CBC WITH DIFFERENTIAL/PLATELET
Abs Immature Granulocytes: 0.01 K/uL (ref 0.00–0.07)
Basophils Absolute: 0 K/uL (ref 0.0–0.1)
Basophils Relative: 1 %
Eosinophils Absolute: 0.1 K/uL (ref 0.0–0.5)
Eosinophils Relative: 4 %
HCT: 29.5 % — ABNORMAL LOW (ref 36.0–46.0)
Hemoglobin: 9.1 g/dL — ABNORMAL LOW (ref 12.0–15.0)
Immature Granulocytes: 0 %
Lymphocytes Relative: 22 %
Lymphs Abs: 0.8 K/uL (ref 0.7–4.0)
MCH: 26.8 pg (ref 26.0–34.0)
MCHC: 30.8 g/dL (ref 30.0–36.0)
MCV: 87 fL (ref 80.0–100.0)
Monocytes Absolute: 0.3 K/uL (ref 0.1–1.0)
Monocytes Relative: 7 %
Neutro Abs: 2.3 K/uL (ref 1.7–7.7)
Neutrophils Relative %: 66 %
Platelets: 158 K/uL (ref 150–400)
RBC: 3.39 MIL/uL — ABNORMAL LOW (ref 3.87–5.11)
RDW: 13.3 % (ref 11.5–15.5)
WBC: 3.5 K/uL — ABNORMAL LOW (ref 4.0–10.5)
nRBC: 0 % (ref 0.0–0.2)

## 2024-01-20 LAB — GLUCOSE, CAPILLARY: Glucose-Capillary: 104 mg/dL — ABNORMAL HIGH (ref 70–99)

## 2024-01-20 LAB — FERRITIN: Ferritin: 76 ng/mL (ref 11–307)

## 2024-01-20 MED ORDER — FLUDEOXYGLUCOSE F - 18 (FDG) INJECTION
7.2900 | Freq: Once | INTRAVENOUS | Status: AC | PRN
  Administered 2024-01-20: 7.29 via INTRAVENOUS

## 2024-01-21 LAB — CA 125: Cancer Antigen (CA) 125: 26.7 U/mL (ref 0.0–38.1)

## 2024-01-21 LAB — CANCER ANTIGEN 19-9: CA 19-9: 5 U/mL (ref 0–35)

## 2024-01-23 DIAGNOSIS — Z23 Encounter for immunization: Secondary | ICD-10-CM | POA: Diagnosis not present

## 2024-01-25 ENCOUNTER — Ambulatory Visit: Admitting: Pulmonary Disease

## 2024-01-26 ENCOUNTER — Inpatient Hospital Stay (HOSPITAL_BASED_OUTPATIENT_CLINIC_OR_DEPARTMENT_OTHER): Admitting: Oncology

## 2024-01-26 ENCOUNTER — Encounter: Payer: Self-pay | Admitting: Oncology

## 2024-01-26 ENCOUNTER — Inpatient Hospital Stay

## 2024-01-26 VITALS — BP 138/67 | HR 100 | Temp 97.9°F | Resp 18 | Ht 64.0 in | Wt 127.0 lb

## 2024-01-26 VITALS — BP 120/56 | HR 86

## 2024-01-26 DIAGNOSIS — Z87891 Personal history of nicotine dependence: Secondary | ICD-10-CM | POA: Diagnosis not present

## 2024-01-26 DIAGNOSIS — K769 Liver disease, unspecified: Secondary | ICD-10-CM | POA: Diagnosis not present

## 2024-01-26 DIAGNOSIS — D509 Iron deficiency anemia, unspecified: Secondary | ICD-10-CM

## 2024-01-26 DIAGNOSIS — R161 Splenomegaly, not elsewhere classified: Secondary | ICD-10-CM | POA: Diagnosis not present

## 2024-01-26 MED ORDER — IRON SUCROSE 20 MG/ML IV SOLN
200.0000 mg | Freq: Once | INTRAVENOUS | Status: AC
  Administered 2024-01-26: 200 mg via INTRAVENOUS
  Filled 2024-01-26: qty 10

## 2024-01-26 MED ORDER — SODIUM CHLORIDE 0.9% FLUSH
10.0000 mL | Freq: Once | INTRAVENOUS | Status: AC | PRN
  Administered 2024-01-26: 10 mL
  Filled 2024-01-26: qty 10

## 2024-01-26 NOTE — Progress Notes (Unsigned)
 [facility]  [phone]) [phone] [phone]  ID: Anita  F Ferrell OB: [birth year]  MR#: [id number]  RDW#:[id number]  Patient Care Team: Glendia Shad, MD as PCP - General (Internal Medicine) Tamea Dedra CROME, MD as Consulting Physician (Pulmonary Disease) Jacobo Evalene PARAS, MD as Consulting Physician (Oncology)  CHIEF COMPLAINT: Iron  deficiency anemia.  INTERVAL HISTORY: Patient returns to clinic today for repeat laboratory, further evaluation, discussion of her PET scan results, and consideration of IV Venofer .  She recently underwent imaging for kidney stone which noted an incidental lesion in her liver and subsequent PET scan was ordered.  She currently feels well and is asymptomatic. She does not complain of any weakness or fatigue.  She has no neurologic complaints.  She denies any recent fevers or illnesses.  Patient has a good appetite and denies weight loss.  She has no chest pain, shortness of breath, cough, or hemoptysis.  She denies any abdominal pain.  She denies any nausea, vomiting, constipation, or diarrhea.  She has no melena or hematochezia.  She has no urinary complaints.  Patient offers no specific complaints today.  REVIEW OF SYSTEMS:   Review of Systems  Constitutional: Negative.  Negative for fever, malaise/fatigue and weight loss.  Respiratory: Negative.  Negative for cough, hemoptysis and shortness of breath.   Cardiovascular: Negative.  Negative for chest pain and leg swelling.  Gastrointestinal: Negative.  Negative for abdominal pain, blood in stool and melena.  Genitourinary: Negative.  Negative for hematuria.  Musculoskeletal: Negative.  Negative for back pain.  Skin: Negative.  Negative for rash.  Neurological: Negative.  Negative for dizziness, focal weakness, weakness and headaches.  Psychiatric/Behavioral: Negative.  The patient is not nervous/anxious.     As per HPI. Otherwise, a complete review of systems is  negative.  PAST MEDICAL HISTORY: Past Medical History:  Diagnosis Date   A-fib (HCC)    Allergy    Asthma    CHF (congestive heart failure) (HCC)    COPD (chronic obstructive pulmonary disease) (HCC)    GERD (gastroesophageal reflux disease)    Hypercholesteremia    Hypertension    Mitral valve insufficiency    Pulmonary hypertension (HCC)    Right Ventricular systolic pressure at 60 mm Hg by echo on july of 2011; Right heart cardic catherization revealed pulmonary artery pressusre of 27/10 with a mean of; Ventricular pressure 29/10 with pulmonary capillary wedge at 9   TB (pulmonary tuberculosis)    treated with INH therapy     PAST SURGICAL HISTORY: Past Surgical History:  Procedure Laterality Date   CARDIAC CATHETERIZATION  10/2016   [facility]   CATARACT EXTRACTION Bilateral 2008   CHOLECYSTECTOMY     ORIF WRIST FRACTURE Right 09/24/2021   Procedure: OPEN REDUCTION INTERNAL FIXATION OF RIGHT DISTAL RADIUS FRACTURE;  Surgeon: Kathlynn Sharper, MD;  Location: [facility] ORS;  Service: Orthopedics;  Laterality: Right;   TONSILLECTOMY  1964    FAMILY HISTORY: Family History  Problem Relation Age of Onset   Heart disease Mother    Heart disease Father    Hypertension Sister    Heart disease Brother    Breast cancer Maternal Aunt     ADVANCED DIRECTIVES (Y/N):  N  HEALTH MAINTENANCE: Social History   Tobacco Use   Smoking status: Former    Current packs/day: 0.00    Average packs/day: 1 pack/day for 40.0 years (40.0 ttl pk-yrs)    Types: Cigarettes    Start date: 08/17/1950    Quit date: 08/17/1990  Years since quitting: 33.4   Smokeless tobacco: Never   Tobacco comments:    Quit in 1992  Vaping Use   Vaping status: Never Used  Substance Use Topics   Alcohol use: No    Alcohol/week: 0.0 standard drinks of alcohol   Drug use: No     Colonoscopy:  PAP:  Bone density:  Lipid panel:  Allergies  Allergen Reactions   Penicillin G Swelling   Penicillins Swelling and  Other (See Comments)    Has patient had a PCN reaction causing immediate rash, facial/tongue/throat swelling, SOB or lightheadedness with hypotension: Yes Has patient had a PCN reaction causing severe rash involving mucus membranes or skin necrosis: No Has patient had a PCN reaction that required hospitalization No Has patient had a PCN reaction occurring within the last 10 years: No If all of the above answers are NO, then may proceed with Cephalosporin use.    Current Outpatient Medications  Medication Sig Dispense Refill   acetaminophen  (TYLENOL ) 500 MG tablet Take 500 mg by mouth as needed.      albuterol  (PROVENTIL ) (2.5 MG/3ML) 0.083% nebulizer solution INHALE CONTENTS 1 VIAL VIA NEBULIZER EVERY 6 HOURS AS NEEDED WHEEZE/SHORTNESS OF BREATH J44.9 375 mL 5   albuterol  (VENTOLIN  HFA) 108 (90 Base) MCG/ACT inhaler Inhale 1-2 puffs into the lungs every 6 (six) hours as needed for wheezing or shortness of breath. 18 g 5   aspirin  EC 81 MG tablet Take 81 mg by mouth daily.     azelastine  (ASTELIN ) 0.1 % nasal spray Use in each nostril as directed 30 mL 5   Calcium  Carbonate-Vitamin D  600-400 MG-UNIT tablet Take 1 tablet by mouth at bedtime.     ciprofloxacin  (CIPRO ) 250 MG tablet Take 1 tablet (250 mg total) by mouth 2 (two) times daily. 10 tablet 0   estradiol  (ESTRACE ) 0.1 MG/GM vaginal cream Apply one pea-sized amount around the opening of the urethra daily for 2 weeks, then 3 times weekly moving forward. 42.5 g 12   famotidine (PEPCID) 20 MG tablet Take 20 mg by mouth daily as needed for heartburn or indigestion.     ferrous sulfate  325 (65 FE) MG tablet Take 325 mg by mouth 2 (two) times daily with a meal.      fexofenadine (ALLEGRA) 180 MG tablet Take 180 mg by mouth daily.     Fluticasone -Umeclidin-Vilant (TRELEGY ELLIPTA ) 100-62.5-25 MCG/ACT AEPB INHALE 1 PUFF BY MOUTH EVERY DAY 60 each 11   lansoprazole (PREVACID) 30 MG capsule Take 30 mg by mouth daily.     meclizine (ANTIVERT) 25  MG tablet Take 25 mg by mouth as needed for dizziness.     metoprolol  tartrate (LOPRESSOR ) 25 MG tablet TAKE 1 TABLET BY MOUTH TWICE A DAY 180 tablet 3   Multiple Vitamins-Minerals (CENTRUM SILVER PO) Take by mouth.     nystatin  cream (MYCOSTATIN ) Apply 1 Application topically 2 (two) times daily. 30 g 0   OXYGEN  Inhale 2 L into the lungs at bedtime.     Probiotic Product (ALIGN PO) Take by mouth.     rosuvastatin  (CRESTOR ) 10 MG tablet Take 1 tablet (10 mg total) by mouth daily. 90 tablet 3   spironolactone  (ALDACTONE ) 25 MG tablet Take 0.5 tablets (12.5 mg total) by mouth daily. 45 tablet 3   Vibegron  (GEMTESA ) 75 MG TABS Take 1 tablet (75 mg total) by mouth daily.     Ensifentrine  (OHTUVAYRE ) 3 MG/2.5ML SUSP Inhale into the lungs 2 (two) times daily. (Patient not  taking: Reported on 01/26/2024)     furosemide  (LASIX ) 20 MG tablet TAKE 1 TABLET BY MOUTH EVERY DAY (Patient not taking: Reported on 01/26/2024) 90 tablet 3   No current facility-administered medications for this visit.    OBJECTIVE: Vitals:   01/26/24 1059  BP: 138/67  Pulse: 100  Resp: 18  Temp: 97.9 F (36.6 C)  SpO2: 97%     Body mass index is 21.8 kg/m.    ECOG FS:0 - Asymptomatic  General: Well-developed, well-nourished, no acute distress. Eyes: Pink conjunctiva, anicteric sclera. HEENT: Normocephalic, moist mucous membranes. Lungs: No audible wheezing or coughing. Heart: Regular rate and rhythm. Abdomen: Soft, nontender, no obvious distention. Musculoskeletal: No edema, cyanosis, or clubbing. Neuro: Alert, answering all questions appropriately. Cranial nerves grossly intact. Skin: No rashes or petechiae noted. Psych: Normal affect.  LAB RESULTS:  Lab Results  Component Value Date   NA 142 10/26/2023   K 5.0 11/29/2023   CL 104 10/26/2023   CO2 29 10/26/2023   GLUCOSE 108 (H) 10/26/2023   BUN 22 10/26/2023   CREATININE 1.39 (H) 10/26/2023   CALCIUM  10.1 10/26/2023   PROT 6.7 10/26/2023   ALBUMIN 4.1  10/26/2023   AST 27 10/26/2023   ALT 20 10/26/2023   ALKPHOS 82 10/26/2023   BILITOT 0.4 10/26/2023   GFRNONAA 36 (L) 09/14/2021   GFRAA 37 (L) 05/27/2018    Lab Results  Component Value Date   WBC 3.5 (L) 01/20/2024   NEUTROABS 2.3 01/20/2024   HGB 9.1 (L) 01/20/2024   HCT 29.5 (L) 01/20/2024   MCV 87.0 01/20/2024   PLT 158 01/20/2024   Lab Results  Component Value Date   IRON  24 (L) 01/20/2024   TIBC 290 01/20/2024   IRONPCTSAT 8 (L) 01/20/2024   Lab Results  Component Value Date   FERRITIN 76 01/20/2024     STUDIES: NM PET Image Initial (PI) Skull Base To Thigh Result Date: 01/23/2024 EXAM: PET AND CT SKULL BASE TO MID THIGH 01/20/2024 11:53:59 AM TECHNIQUE: RADIOPHARMACEUTICAL: 7.29 mCi F-18 FDG Uptake time 60 minutes. Glucose level 104 mg/dl. PET imaging was acquired from the base of the skull to the mid thighs. Non-contrast enhanced computed tomography was obtained for attenuation correction and anatomic localization. COMPARISON: CT without contrast dated 01/03/2024. CLINICAL HISTORY: Liver lesion, assess lymphoproliferative disorder. 7.29 mCi F-18 FDG LAC IV inj @ 1035; BG 104 mg/dL; NM PET IMAGE INITIAL; LIVER LESION; LYMPHOPROLIFERATIVE DISEASE; NPO; CURRENTLY TAKING ANTIBIOTICS; 01/03/24 CT ABDOMEN AND PELVIS WITHOUT CONTRAST. FINDINGS: HEAD AND NECK: No metabolically active cervical lymphadenopathy. CHEST: Within the medial aspect of the right lower lobe, there is a rounded focus of consolidation measuring 2 cm on image 54 with intense metabolic activity (SUV max 8.8). A small hypermetallic nodule is present in the superior segment of the right lower lobe measuring 10 mm on image 44. There is mild metabolic activity associated with small hilar lymph nodes. ABDOMEN AND PELVIS: The spleen is markedly enlarged measuring 16.5 cm in craniocaudad dimension. The spleen has relatively normal metabolic activity similar to the liver. No focal hypermetabolic lesions are identified  within the liver. Mild activity is associated with periportal lymph nodes. No other clear hypermetabolic disease is identified in the abdomen and pelvis. Line Incidental CT findings include left colon diverticulosis without evidence of diverticulitis, atherosclerotic calcification of the aorta, and bulky calcifications in the left renal hilum. The uterus is unremarkable. BONES AND SOFT TISSUE: Evaluation of the skeleton demonstrates several foci of metabolic activity within the  ribs. For example, a lesion of the posterior left rib with a maximum SUV of 4.0 on image 42, without CT correlation. Multiple individual foci of metabolic activity are noted within the left ribs primarily, described as indeterminate activity. No metabolically active aggressive osseous lesion. IMPRESSION: 1. Rounded 2 cm right lower lobe consolidation with intense metabolic activity (SUV max 8.8). favor focus of infection over malignancy. 2. Small 10 mm right lower lobe pulmonary nodule with mild metabolic activity. 3. Mildly metabolically active hilar and periportal lymph nodes, nonspecific. 4. Splenomegaly (craniocaudal length 16.5 cm) with metabolic activity similar to liver. 5. Multiple foci of rib metabolic activity without CT correlate, indeterminate. 6. No lesion with concern of liver. Electronically signed by: Norleen Boxer MD 01/23/2024 11:31 AM EDT RP Workstation: [id number]   CT RENAL STONE STUDY Result Date: 01/05/2024 CLINICAL DATA:  Symptomatic nephrolithiasis. Recurrent urinary tract infections. EXAM: CT ABDOMEN AND PELVIS WITHOUT CONTRAST TECHNIQUE: Multidetector CT imaging of the abdomen and pelvis was performed following the standard protocol without IV contrast. RADIATION DOSE REDUCTION: This exam was performed according to the departmental dose-optimization program which includes automated exposure control, adjustment of the mA and/or kV according to patient size and/or use of iterative reconstruction technique.  COMPARISON:  06/30/2011 FINDINGS: Lower chest: No acute findings. Hepatobiliary: Low-attenuation lesion in the anterior liver dome is seen measuring 1.6 cm which is new since previous study. This cannot be characterized on this unenhanced exam. No definite signs of cirrhosis identified. Prior cholecystectomy. No evidence of biliary obstruction. Pancreas: No mass or inflammatory process visualized on this unenhanced exam. Spleen: Marked splenomegaly is increased since previous study, with splenic length currently measuring 19.6 cm compared to 14.2 cm previously. Adrenals/Urinary tract: A partial staghorn calculus is seen in the left renal pelvis and lower pole collecting system. No evidence of ureteral calculi or hydronephrosis. Unremarkable unopacified urinary bladder. Stomach/Bowel: No evidence of obstruction, inflammatory process, or abnormal fluid collections. Diverticulosis is seen mainly involving the sigmoid colon, however there is no evidence of diverticulitis. Vascular/Lymphatic: New lymphadenopathy is seen in the porta hepatis and portacaval space measuring up to 2.0 cm short axis. No other sites of lymphadenopathy seen within the abdomen or pelvis. No evidence of abdominal aortic aneurysm. Reproductive: Several small calcified uterine fibroids again seen measuring up to 1 cm in size. Adnexal regions are unremarkable. Other: Stable small midline epigastric ventral hernia containing only fat. Musculoskeletal:  No suspicious bone lesions identified. IMPRESSION: Partial staghorn calculus in left renal pelvis and lower pole collecting system. No evidence of ureteral calculi or hydronephrosis. New 1.6 cm low-attenuation lesion in anterior liver dome, which cannot be characterized on this unenhanced exam. Abdomen MRI without and with contrast is recommended for further characterization. New porta hepatis and portacaval lymphadenopathy, and increased marked splenomegaly. This raises suspicion for  lymphoproliferative disorder. Consider hematology-oncology consultation. Colonic diverticulosis, without radiographic evidence of diverticulitis. Stable small calcified uterine fibroids. Stable small epigastric ventral hernia containing only fat. Electronically Signed   By: Norleen DELENA Kil M.D.   On: 01/05/2024 10:10    ASSESSMENT: Iron  deficiency anemia.  PLAN:    Iron  deficiency anemia: Patient's hemoglobin is 9.1 today and her iron  stores have declined.  Previously all of her other laboratory work was either negative or within normal limits.   [employer] myeloid panel revealed a TET2 mutation which can be seen in both myeloproliferative disorders and MDS.  Her last colonoscopy was in 2016.  Patient takes oral iron  supplementation and is tolerating it well. Will proceed  with 200 mg IV Venofer  today.  Patient will return to clinic 3 times over the next 1 to 2 weeks to receive additional treatments.  Patient would then return to clinic in 3 months for repeat laboratory, further evaluation, consideration of additional treatment.   Hypermetabolic liver lesion: Radiology report favors infection of her malignancy.  Will further discuss at tumor board to see if biopsy is necessary. Splenomegaly: Possibly related to underlying bone marrow disorder as above.  We discussed at length whether or not to pursue bone marrow biopsy.  Patient wishes to think further on whether or not proceed with this procedure.  She will call clinic with her decision.   Patient expressed understanding and was in agreement with this plan. She also understands that She can call clinic at any time with any questions, concerns, or complaints.    Evalene JINNY Reusing, MD   01/27/2024 7:37 AM

## 2024-01-27 ENCOUNTER — Encounter: Payer: Self-pay | Admitting: Oncology

## 2024-01-28 ENCOUNTER — Encounter: Payer: Self-pay | Admitting: Internal Medicine

## 2024-01-28 DIAGNOSIS — K769 Liver disease, unspecified: Secondary | ICD-10-CM | POA: Insufficient documentation

## 2024-01-29 DIAGNOSIS — R3915 Urgency of urination: Secondary | ICD-10-CM

## 2024-01-29 DIAGNOSIS — N958 Other specified menopausal and perimenopausal disorders: Secondary | ICD-10-CM

## 2024-01-30 ENCOUNTER — Inpatient Hospital Stay

## 2024-01-30 VITALS — BP 123/54 | HR 81 | Temp 97.8°F | Resp 20

## 2024-01-30 DIAGNOSIS — R161 Splenomegaly, not elsewhere classified: Secondary | ICD-10-CM | POA: Diagnosis not present

## 2024-01-30 DIAGNOSIS — Z87891 Personal history of nicotine dependence: Secondary | ICD-10-CM | POA: Diagnosis not present

## 2024-01-30 DIAGNOSIS — K769 Liver disease, unspecified: Secondary | ICD-10-CM | POA: Diagnosis not present

## 2024-01-30 DIAGNOSIS — D509 Iron deficiency anemia, unspecified: Secondary | ICD-10-CM

## 2024-01-30 MED ORDER — IRON SUCROSE 20 MG/ML IV SOLN
200.0000 mg | Freq: Once | INTRAVENOUS | Status: AC
  Administered 2024-01-30: 200 mg via INTRAVENOUS
  Filled 2024-01-30: qty 10

## 2024-01-30 NOTE — Patient Instructions (Signed)

## 2024-01-31 ENCOUNTER — Other Ambulatory Visit: Payer: Self-pay | Admitting: Physician Assistant

## 2024-01-31 ENCOUNTER — Other Ambulatory Visit: Payer: Self-pay

## 2024-01-31 DIAGNOSIS — R3915 Urgency of urination: Secondary | ICD-10-CM

## 2024-01-31 DIAGNOSIS — N958 Other specified menopausal and perimenopausal disorders: Secondary | ICD-10-CM

## 2024-01-31 MED ORDER — GEMTESA 75 MG PO TABS: 75.0000 mg | ORAL_TABLET | Freq: Every day | ORAL | 3 refills | Status: DC

## 2024-01-31 NOTE — Telephone Encounter (Signed)
 Sent in the Gemtesa  to the pahrmacy for her and she is already set up for a 3 mth f/u

## 2024-02-01 ENCOUNTER — Inpatient Hospital Stay

## 2024-02-01 ENCOUNTER — Other Ambulatory Visit: Payer: Self-pay | Admitting: Physician Assistant

## 2024-02-01 VITALS — BP 115/62 | HR 83 | Temp 97.7°F | Resp 18

## 2024-02-01 DIAGNOSIS — R161 Splenomegaly, not elsewhere classified: Secondary | ICD-10-CM | POA: Diagnosis not present

## 2024-02-01 DIAGNOSIS — D509 Iron deficiency anemia, unspecified: Secondary | ICD-10-CM

## 2024-02-01 DIAGNOSIS — R3915 Urgency of urination: Secondary | ICD-10-CM

## 2024-02-01 DIAGNOSIS — Z87891 Personal history of nicotine dependence: Secondary | ICD-10-CM | POA: Diagnosis not present

## 2024-02-01 DIAGNOSIS — K769 Liver disease, unspecified: Secondary | ICD-10-CM | POA: Diagnosis not present

## 2024-02-01 MED ORDER — IRON SUCROSE 20 MG/ML IV SOLN
200.0000 mg | Freq: Once | INTRAVENOUS | Status: AC
  Administered 2024-02-01: 200 mg via INTRAVENOUS
  Filled 2024-02-01: qty 10

## 2024-02-01 NOTE — Patient Instructions (Signed)

## 2024-02-02 ENCOUNTER — Encounter

## 2024-02-03 ENCOUNTER — Inpatient Hospital Stay

## 2024-02-03 VITALS — BP 115/61 | HR 87 | Temp 97.9°F | Resp 20

## 2024-02-03 DIAGNOSIS — K769 Liver disease, unspecified: Secondary | ICD-10-CM | POA: Diagnosis not present

## 2024-02-03 DIAGNOSIS — D509 Iron deficiency anemia, unspecified: Secondary | ICD-10-CM

## 2024-02-03 DIAGNOSIS — Z87891 Personal history of nicotine dependence: Secondary | ICD-10-CM | POA: Diagnosis not present

## 2024-02-03 DIAGNOSIS — R161 Splenomegaly, not elsewhere classified: Secondary | ICD-10-CM | POA: Diagnosis not present

## 2024-02-03 MED ORDER — IRON SUCROSE 20 MG/ML IV SOLN
200.0000 mg | Freq: Once | INTRAVENOUS | Status: AC
  Administered 2024-02-03: 200 mg via INTRAVENOUS
  Filled 2024-02-03: qty 10

## 2024-02-03 NOTE — Patient Instructions (Signed)

## 2024-02-06 ENCOUNTER — Telehealth: Payer: Self-pay

## 2024-02-06 NOTE — Telephone Encounter (Signed)
 Phone call attempted to Anita Ferrell VM left for a return call to the clinic.

## 2024-02-07 ENCOUNTER — Ambulatory Visit (INDEPENDENT_AMBULATORY_CARE_PROVIDER_SITE_OTHER): Admitting: Pulmonary Disease

## 2024-02-07 ENCOUNTER — Encounter: Payer: Self-pay | Admitting: Pulmonary Disease

## 2024-02-07 VITALS — BP 126/76 | HR 97 | Temp 97.7°F | Ht 64.0 in | Wt 127.2 lb

## 2024-02-07 DIAGNOSIS — J440 Chronic obstructive pulmonary disease with acute lower respiratory infection: Secondary | ICD-10-CM

## 2024-02-07 DIAGNOSIS — J449 Chronic obstructive pulmonary disease, unspecified: Secondary | ICD-10-CM

## 2024-02-07 DIAGNOSIS — R918 Other nonspecific abnormal finding of lung field: Secondary | ICD-10-CM

## 2024-02-07 DIAGNOSIS — G4736 Sleep related hypoventilation in conditions classified elsewhere: Secondary | ICD-10-CM

## 2024-02-07 DIAGNOSIS — Z87891 Personal history of nicotine dependence: Secondary | ICD-10-CM

## 2024-02-07 DIAGNOSIS — R0609 Other forms of dyspnea: Secondary | ICD-10-CM

## 2024-02-07 DIAGNOSIS — I272 Pulmonary hypertension, unspecified: Secondary | ICD-10-CM

## 2024-02-07 MED ORDER — PREDNISONE 20 MG PO TABS: 20.0000 mg | ORAL_TABLET | Freq: Every day | ORAL | 0 refills | Status: AC

## 2024-02-07 MED ORDER — TRELEGY ELLIPTA 200-62.5-25 MCG/ACT IN AEPB: 1.0000 | INHALATION_SPRAY | Freq: Every day | RESPIRATORY_TRACT | 11 refills | Status: AC

## 2024-02-07 MED ORDER — MIRABEGRON ER 50 MG PO TB24: 50.0000 mg | ORAL_TABLET | Freq: Every day | ORAL | 3 refills | Status: DC

## 2024-02-07 MED ORDER — AZITHROMYCIN 250 MG PO TABS: ORAL_TABLET | ORAL | 0 refills | Status: DC

## 2024-02-07 NOTE — Patient Instructions (Signed)
 VISIT SUMMARY:  Today, you came in for a follow-up visit to discuss your severe COPD and other health concerns. You mentioned a persistent cough, decreased breathing capacity, and issues with your current medications. We also reviewed your recent PET scan results and discussed your treatment plan moving forward.  YOUR PLAN:  -SEVERE CHRONIC OBSTRUCTIVE PULMONARY DISEASE (COPD): COPD is a chronic lung disease that makes it hard to breathe. We have increased the strength of your Trelegy inhaler to help improve your symptoms.  Make sure that you rinse your mouth well after you use it.  You should continue using supplemental oxygen  at night and as needed. We will hold off on using the Ohtuvayre  nebulizer for one week to see if there is any change in your symptoms.  -ACUTE PNEUMONIA IN THE SETTING OF SEVERE COPD: Pneumonia is an infection in your lungs that can make it harder to breathe. Your PET scan showed a spot that is likely pneumonia. We have prescribed azithromycin  (Z-Pak) to treat the infection and a short course of prednisone  to reduce inflammation. Please monitor your symptoms and follow up in 4-6 weeks.  INSTRUCTIONS:  Please follow up in 4-6 weeks to monitor your symptoms and assess the effectiveness of your treatment plan. If you experience any worsening of symptoms or new issues, contact our office immediately.

## 2024-02-07 NOTE — Telephone Encounter (Signed)
 Sent in Mirabegron 50mg  as replacement for Gemtesa  since insurance will not cover gemtesa 

## 2024-02-07 NOTE — Progress Notes (Unsigned)
 Subjective:    Patient ID: Anita  F Ferrell, female    DOB: 1932-12-16, 89 y.o.   MRN: [id number]  Patient Care Team: Glendia Shad, MD as PCP - General (Internal Medicine) Tamea Dedra CROME, MD as Consulting Physician (Pulmonary Disease) Jacobo Evalene PARAS, MD as Consulting Physician (Oncology)  Chief Complaint  Patient presents with   COPD    Cough and shortness of breath on exertion. Occasional wheezing. Using oxygen  at night, occasional during the day.     BACKGROUND/INTERVAL:Anita Ferrell  is an 89 year old remote former smoker who follows up for COPD and nocturnal hypoxemia due to chronic bronchitis.  Last seen on 21 September 2023 by me, she was started on Ohtuvayre  in February and has been compliant with the medication.  Followed by Dr. Jacobo for iron  deficiency anemia.  She was being evaluated for a possible liver lesion and for potential lymphoproliferative disorder and a PET CT scan was performed on 20 January 2024.  This showed a 2 cm right lower lobe consolidation which has intense metabolic activity but is ill-defined.  We are asked to render opinion.  HPI Discussed the use of AI scribe software for clinical note transcription with the patient, who gave verbal consent to proceed.  History of Present Illness   Anita  F Ferrell is an 89 year old female with severe COPD who presents for a follow-up visit.  She presents with her son, Alm.  She has a persistent cough without fever, chills, or changes in sputum color. She notes decreased breathing capacity over the past couple of weeks, especially at night, affecting her ability to walk long distances without becoming short of breath. She uses supplemental oxygen  at night and during significant shortness of breath, which helps normalize her oxygen  levels. Oxygen  saturation drops to 92-93% during exertion but returns to normal with oxygen  use. She uses Trelegy and a nebulizer medication, Ohtuvayre , but finds the nebulizer only  slightly effective. Due to a previous wrist injury, she prefers medications like Trelegy that do not require manual dexterity. She experiences increased breathing difficulties in the evening.  She has a history of urinary tract infections treated with two rounds of Cipro , with the last course completed in August. She mentions a kidney stone that may be contributing to recurrent infections. She is currently experiencing issues with obtaining her prescribed medication, Gemtesa , for incontinence, and has been without it for two days. An alternative medication, Myrbetriq, was prescribed due to insurance issues, but it is not stocked at her pharmacy.  A recent PET scan showed a spot on her lung and an enlarged spleen. She recalls a discussion about a possible biopsy for the spleen but is not interested in pursuing it due to her age. She also mentions a focus seen on the scan that was described to her as possible pneumonia.     DATA: CT chest 07/29/15:Significant decrease in size of the previously demonstrated right lower lobe nodule, compatible with a benign process. No significant change in multiple additional sub cm nodules and calcified granulomata in both lungs, compatible with a benign process. Mild changes of COPD and chronic bronchitis. Resolved infection in the lingula and left lower lobe PFTs 07/29/15: moderate obstruction, normal lung volumes, mild reduction DLCO.  TEE 10/19/16: LVEF 45%. Moderate mitral regurgitation, moderate [facility] 10/25/16: Nonobstructive coronary disease. Moderate pulmonary hypertension (systolic 50 mmHg, mean 30 mmHg) 2D echo 07/17/2019: LVEF 55 to 60%, indeterminate diastolic parameters.  Normal pulmonary artery systolic pressure. Tricuspid regurgitation mild to moderate.  No aortic  stenosis. PFTs 09/08/2023: FEV1 0.95 L or 58% predicted FVC 2.03 L or 92% predicted, FEV1/FVC 57%.  No bronchodilator test done as the patient had just used bronchodilator (albuterol ) prior to  his testing.  There is significant hyperinflation and air trapping diffusion capacity is severely impaired.  Compared to study performed in 2017 there has been significant decline in FEV1 and FVC indicating progression of disease. PET/CT 01/20/2024: Rounded 2 cm right lower lobe consolidation with intense metabolic activity favor focus of infection over malignancy.  Small 10 mm right lower lobe pulmonary nodule with mild metabolic activity.  Mildly metabolically active hilar and periportal lymph nodes nonspecific.  Splenomegaly with metabolic activity similar to liver.  Multiple foci of rib metabolic activity without CT correlate, indeterminate.  No lesion of concern in the liver.   Review of Systems A 10 point review of systems was performed and it is as noted above otherwise negative.   Patient Active Problem List   Diagnosis Date Noted   Liver lesion 01/28/2024   Frequent UTI 12/27/2023   Urinary frequency 10/26/2023   UTI symptoms 10/04/2023   Nocturnal hypoxemia due to obstructive chronic bronchitis (HCC) 11/24/2022   Osteoporosis 08/19/2020   Low back pain 05/20/2020   Left hip pain 05/20/2020   Irregular heart rhythm 01/15/2020   Nocturnal hypoxia 12/18/2019   Pulmonary HTN (HCC) 12/26/2016   Moderate mitral regurgitation 10/27/2016   Bradycardia 09/23/2016   Ventricular bigeminy 09/23/2016   Diarrhea 01/13/2016   CKD (chronic kidney disease) stage 3, GFR 30-59 ml/min (HCC) 10/16/2015   Shortness of breath 03/11/2015   COPD, moderate (HCC) 03/11/2015   Environmental allergies 01/07/2015   Health care maintenance 05/29/2014   Nephrolithiasis 03/13/2014   Obesity 03/10/2014   Splenomegaly 03/05/2014   Rectal bleeding 03/05/2014   Hyperglycemia 12/30/2012   Hemorrhoids 12/30/2012   Grade I internal hemorrhoids 12/30/2012   Anemia 08/27/2012   Hypercholesterolemia 08/25/2012   Essential hypertension 05/28/2012   Asthma, chronic 05/28/2012   GERD (gastroesophageal reflux  disease) 05/28/2012   Barrett's esophagus 05/28/2012    Social History   Tobacco Use   Smoking status: Former    Current packs/day: 0.00    Average packs/day: 1 pack/day for 40.0 years (40.0 ttl pk-yrs)    Types: Cigarettes    Start date: 08/17/1950    Quit date: 08/17/1990    Years since quitting: 33.4   Smokeless tobacco: Never   Tobacco comments:    Quit in 1992  Substance Use Topics   Alcohol use: No    Alcohol/week: 0.0 standard drinks of alcohol    Allergies  Allergen Reactions   Penicillin G Swelling   Penicillins Swelling and Other (See Comments)    Has patient had a PCN reaction causing immediate rash, facial/tongue/throat swelling, SOB or lightheadedness with hypotension: Yes Has patient had a PCN reaction causing severe rash involving mucus membranes or skin necrosis: No Has patient had a PCN reaction that required hospitalization No Has patient had a PCN reaction occurring within the last 10 years: No If all of the above answers are NO, then may proceed with Cephalosporin use.    Current Meds  Medication Sig   acetaminophen  (TYLENOL ) 500 MG tablet Take 500 mg by mouth as needed.    albuterol  (PROVENTIL ) (2.5 MG/3ML) 0.083% nebulizer solution INHALE CONTENTS 1 VIAL VIA NEBULIZER EVERY 6 HOURS AS NEEDED WHEEZE/SHORTNESS OF BREATH J44.9   albuterol  (VENTOLIN  HFA) 108 (90 Base) MCG/ACT inhaler Inhale 1-2 puffs into the lungs every 6 (six)  hours as needed for wheezing or shortness of breath.   aspirin  EC 81 MG tablet Take 81 mg by mouth daily.   azelastine  (ASTELIN ) 0.1 % nasal spray Use in each nostril as directed   azithromycin  (ZITHROMAX ) 250 MG tablet 2 tabs today, then 1 tab daily on days 2-5   Calcium  Carbonate-Vitamin D  600-400 MG-UNIT tablet Take 1 tablet by mouth at bedtime.   Ensifentrine  (OHTUVAYRE ) 3 MG/2.5ML SUSP Inhale into the lungs 2 (two) times daily.   estradiol  (ESTRACE ) 0.1 MG/GM vaginal cream Apply one pea-sized amount around the opening of the  urethra daily for 2 weeks, then 3 times weekly moving forward.   famotidine (PEPCID) 20 MG tablet Take 20 mg by mouth daily as needed for heartburn or indigestion.   ferrous sulfate  325 (65 FE) MG tablet Take 325 mg by mouth 2 (two) times daily with a meal.    fexofenadine (ALLEGRA) 180 MG tablet Take 180 mg by mouth daily.   Fluticasone -Umeclidin-Vilant (TRELEGY ELLIPTA ) 200-62.5-25 MCG/ACT AEPB Inhale 1 puff into the lungs daily.   furosemide  (LASIX ) 20 MG tablet TAKE 1 TABLET BY MOUTH EVERY DAY   lansoprazole (PREVACID) 30 MG capsule Take 30 mg by mouth daily.   meclizine (ANTIVERT) 25 MG tablet Take 25 mg by mouth as needed for dizziness.   metoprolol  tartrate (LOPRESSOR ) 25 MG tablet TAKE 1 TABLET BY MOUTH TWICE A DAY   Multiple Vitamins-Minerals (CENTRUM SILVER PO) Take by mouth.   OXYGEN  Inhale 2 L into the lungs at bedtime.   predniSONE  (DELTASONE ) 20 MG tablet Take 1 tablet (20 mg total) by mouth daily with breakfast for 5 days.   Probiotic Product (ALIGN PO) Take by mouth.   rosuvastatin  (CRESTOR ) 10 MG tablet Take 1 tablet (10 mg total) by mouth daily.   spironolactone  (ALDACTONE ) 25 MG tablet Take 0.5 tablets (12.5 mg total) by mouth daily.   [DISCONTINUED] Fluticasone -Umeclidin-Vilant (TRELEGY ELLIPTA ) 100-62.5-25 MCG/ACT AEPB INHALE 1 PUFF BY MOUTH EVERY DAY    Immunization History  Administered Date(s) Administered   Fluad Quad(high Dose 65+) 12/15/2018, 12/30/2020, 02/01/2022   INFLUENZA, HIGH DOSE SEASONAL PF 01/13/2016, 01/25/2017, 01/10/2018, 01/27/2023, 01/23/2024   Influenza Split 01/15/2020   Influenza,inj,Quad PF,6+ Mos 12/26/2012, 01/12/2014, 01/14/2015   Moderna Covid-19 Fall Seasonal Vaccine 41yrs & older 02/08/2022, 01/27/2023   Moderna Covid-19 Vaccine  Bivalent Booster 81yrs & up 01/26/2021, 02/08/2022   Moderna Sars-Covid-2 Vaccination 05/02/2019, 05/30/2019, 02/14/2020, 08/11/2020   Pneumococcal Conjugate-13 09/02/2014   Pneumococcal Polysaccharide-23  11/14/2012   Tdap 11/14/2012   Unspecified SARS-COV-2 Vaccination 01/23/2024        Objective:     BP 126/76   Pulse 97   Temp 97.7 F (36.5 C) (Temporal)   Ht 5' 4 (1.626 m)   Wt 127 lb 3.2 oz (57.7 kg)   SpO2 95% Comment: room air  BMI 21.83 kg/m   SpO2: 95 % (room air)  GENERAL: Well-nourished female, spry, no acute respiratory distress, ambulatory with assistance of a walker.  Very well-groomed.  Speech is fluent. HEAD: Normocephalic, atraumatic.  EYES: Pupils equal, round, reactive to light.  No scleral icterus.  MOUTH: Oral mucosa moist.  Dentures uppers and lowers NECK: Supple. No thyromegaly. No nodules. No JVD.  Trachea midline PULMONARY: Distant breath sounds, coarse, no other adventitious sounds CARDIOVASCULAR: S1 and S2. Regular rate and rhythm.  Grade 2/6 systolic ejection murmur consistent with mitral regurg. GASTROINTESTINAL: Nondistended abdomen.  Benign. MUSCULOSKELETAL: No joint deformity, no clubbing, no edema.  NEUROLOGIC: No overt focal deficits,  speech fluent. SKIN: Intact,warm,dry.  Limited exam no rashes. PSYCH: Mood and behavior normal.  Representative image from the PET/CT performed 20 January 2024 showing the area of concern in question (arrow):    Assessment & Plan:     ICD-10-CM   1. Stage 3 severe COPD by GOLD classification (HCC)  J44.9     2. Nocturnal hypoxemia due to obstructive chronic bronchitis (HCC)  J44.89    G47.36     3. Opacity of lung on imaging study  R91.8     4. Pulmonary HTN (HCC)  I27.20     5. Dyspnea on exertion  R06.09       Meds ordered this encounter  Medications   predniSONE  (DELTASONE ) 20 MG tablet    Sig: Take 1 tablet (20 mg total) by mouth daily with breakfast for 5 days.    Dispense:  5 tablet    Refill:  0   azithromycin  (ZITHROMAX ) 250 MG tablet    Sig: 2 tabs today, then 1 tab daily on days 2-5    Dispense:  6 tablet    Refill:  0   Fluticasone -Umeclidin-Vilant (TRELEGY ELLIPTA ) 200-62.5-25  MCG/ACT AEPB    Sig: Inhale 1 puff into the lungs daily.    Dispense:  60 each    Refill:  11    Discussion:    Severe chronic obstructive pulmonary disease (COPD) Severe COPD with persistent cough and decreased exercise tolerance. Oxygen  saturation drops to 92-93% at night and improves with supplemental oxygen . Current treatment includes Trelegy, which provides some relief, but symptoms worsen in the evening. Difficulty using puffer inhalers due to wrist injury. - Increased strength of Trelegy inhaler to improve symptom control. - Hold Ohtuvayre  nebulizer for one week to assess impact on symptoms. - Continue supplemental oxygen  at night and as needed.  Possible acute pneumonia in the setting of severe COPD PET scan shows a focus of pneumonia in an unusual location, likely contributing to decreased breathing capacity. Biopsy not recommended due to difficulty of access and her age. Symptoms include yellow sputum production and decreased exercise tolerance. - Prescribed azithromycin  (Z-Pak) for pneumonia. - Prescribed a short course of prednisone  to reduce inflammation. - Monitor symptoms and follow up in 4-6 weeks.      Advised if symptoms do not improve or worsen, to please contact office for sooner follow up or seek emergency care.    I spent 40 minutes of dedicated to the care of this patient on the date of this encounter to include pre-visit review of records, face-to-face time with the patient discussing conditions above, post visit ordering of testing, clinical documentation with the electronic health record, making appropriate referrals as documented, and communicating necessary findings to members of the patients care team.     C. Leita Sanders, MD Advanced Bronchoscopy PCCM [facility]    *This note was generated using voice recognition software/Dragon and/or AI transcription program.  Despite best efforts to proofread, errors can occur which can change the  meaning. Any transcriptional errors that result from this process are unintentional and may not be fully corrected at the time of dictation.

## 2024-02-10 ENCOUNTER — Other Ambulatory Visit: Payer: Self-pay | Admitting: Cardiovascular Disease

## 2024-02-12 ENCOUNTER — Encounter: Payer: Self-pay | Admitting: Internal Medicine

## 2024-02-27 ENCOUNTER — Ambulatory Visit: Admitting: Internal Medicine

## 2024-02-27 ENCOUNTER — Encounter: Payer: Self-pay | Admitting: Internal Medicine

## 2024-02-27 VITALS — BP 122/78 | HR 92 | Temp 97.6°F | Ht 64.0 in | Wt 123.8 lb

## 2024-02-27 DIAGNOSIS — E7800 Pure hypercholesterolemia, unspecified: Secondary | ICD-10-CM

## 2024-02-27 DIAGNOSIS — J449 Chronic obstructive pulmonary disease, unspecified: Secondary | ICD-10-CM

## 2024-02-27 DIAGNOSIS — M810 Age-related osteoporosis without current pathological fracture: Secondary | ICD-10-CM

## 2024-02-27 DIAGNOSIS — N1832 Chronic kidney disease, stage 3b: Secondary | ICD-10-CM

## 2024-02-27 DIAGNOSIS — J4489 Other specified chronic obstructive pulmonary disease: Secondary | ICD-10-CM | POA: Diagnosis not present

## 2024-02-27 DIAGNOSIS — I272 Pulmonary hypertension, unspecified: Secondary | ICD-10-CM

## 2024-02-27 DIAGNOSIS — G4736 Sleep related hypoventilation in conditions classified elsewhere: Secondary | ICD-10-CM

## 2024-02-27 DIAGNOSIS — R739 Hyperglycemia, unspecified: Secondary | ICD-10-CM | POA: Diagnosis not present

## 2024-02-27 DIAGNOSIS — I10 Essential (primary) hypertension: Secondary | ICD-10-CM | POA: Diagnosis not present

## 2024-02-27 DIAGNOSIS — Z1231 Encounter for screening mammogram for malignant neoplasm of breast: Secondary | ICD-10-CM

## 2024-02-27 DIAGNOSIS — K769 Liver disease, unspecified: Secondary | ICD-10-CM

## 2024-02-27 DIAGNOSIS — D649 Anemia, unspecified: Secondary | ICD-10-CM

## 2024-02-27 DIAGNOSIS — K2100 Gastro-esophageal reflux disease with esophagitis, without bleeding: Secondary | ICD-10-CM

## 2024-02-27 LAB — BASIC METABOLIC PANEL WITH GFR
BUN: 25 mg/dL — ABNORMAL HIGH (ref 6–23)
CO2: 28 meq/L (ref 19–32)
Calcium: 9.2 mg/dL (ref 8.4–10.5)
Chloride: 104 meq/L (ref 96–112)
Creatinine, Ser: 1.45 mg/dL — ABNORMAL HIGH (ref 0.40–1.20)
GFR: 31.91 mL/min — ABNORMAL LOW (ref >60.00)
Glucose, Bld: 105 mg/dL — ABNORMAL HIGH (ref 70–99)
Potassium: 5.1 meq/L (ref 3.5–5.1)
Sodium: 141 meq/L (ref 135–145)

## 2024-02-27 LAB — HEPATIC FUNCTION PANEL
ALT: 30 U/L (ref 0–35)
AST: 33 U/L (ref 0–37)
Albumin: 4.2 g/dL (ref 3.5–5.2)
Alkaline Phosphatase: 89 U/L (ref 39–117)
Bilirubin, Direct: 0.1 mg/dL (ref 0.0–0.3)
Total Bilirubin: 0.3 mg/dL (ref 0.2–1.2)
Total Protein: 6.4 g/dL (ref 6.0–8.3)

## 2024-02-27 LAB — LIPID PANEL
Cholesterol: 103 mg/dL (ref 0–200)
HDL: 40.2 mg/dL (ref >39.00)
LDL Cholesterol: 42 mg/dL (ref 0–99)
NonHDL: 62.52
Total CHOL/HDL Ratio: 3
Triglycerides: 101 mg/dL (ref 0.0–149.0)
VLDL: 20.2 mg/dL (ref 0.0–40.0)

## 2024-02-27 LAB — HEMOGLOBIN A1C: Hgb A1c MFr Bld: 5.9 % (ref 4.6–6.5)

## 2024-02-27 NOTE — Progress Notes (Signed)
 Subjective:    Patient ID: Anita Ferrell, female    DOB: Jun 05, 1932, 89 y.o.   MRN: [id number]  Patient here for  Chief Complaint  Patient presents with   Medical Management of Chronic Issues    HPI Here for a scheduled follow up - follow up regarding hypertension, hypercholesterolemia, recurring UTIs and COPD.  Had f/u with Dr Tamea 02/07/24 - f/u PET CT scan 01/20/24 - revealed a 2 cm right lower lobe consolidation and f/u COPD. Recommended to increase strength of trelegy inhaler. Continue using oxygen  at night and prn. Felt the spot in her lungs was pneumonia. Treated with zpak and prednisone . Saw Dr Jacobo 01/26/24 - takes oral iron . Was given IV venofer  01/26/24. Discussed liver lesion and splenomegaly and possible bone marrow biopsy.  Saw urology 12/27/23 - elected to hold on cystoscopy. Asymptomatic microscopic hematuria. Recommended CT stone study. Recommended topical vaginal estrogen. Also started on gemtesa  - changed to myrbetriq - due to insurance/cost. CT - partial staghorn calculus in the left renal pelvis and lower pole collecting system. Overall she feels things are stable. Breathing overall stable. Did report some diarrhea - discussed kaopectate. Not a persistent issue. Overall doing well.    Past Medical History:  Diagnosis Date   A-fib (HCC)    Allergy    Asthma    CHF (congestive heart failure) (HCC)    COPD (chronic obstructive pulmonary disease) (HCC)    GERD (gastroesophageal reflux disease)    Hypercholesteremia    Hypertension    Mitral valve insufficiency    Pulmonary hypertension (HCC)    Right Ventricular systolic pressure at 60 mm Hg by echo on july of 2011; Right heart cardic catherization revealed pulmonary artery pressusre of 27/10 with a mean of; Ventricular pressure 29/10 with pulmonary capillary wedge at 9   TB (pulmonary tuberculosis)    treated with INH therapy    Past Surgical History:  Procedure Laterality Date   CARDIAC CATHETERIZATION   10/2016   [facility]   CATARACT EXTRACTION Bilateral 2008   CHOLECYSTECTOMY     ORIF WRIST FRACTURE Right 09/24/2021   Procedure: OPEN REDUCTION INTERNAL FIXATION OF RIGHT DISTAL RADIUS FRACTURE;  Surgeon: Kathlynn Sharper, MD;  Location: [facility] ORS;  Service: Orthopedics;  Laterality: Right;   TONSILLECTOMY  1964   Family History  Problem Relation Age of Onset   Heart disease Mother    Heart disease Father    Hypertension Sister    Heart disease Brother    Breast cancer Maternal Aunt    Social History   Socioeconomic History   Marital status: Widowed    Spouse name: Not on file   Number of children: 3   Years of education: Not on file   Highest education level: Not on file  Occupational History   Not on file  Tobacco Use   Smoking status: Former    Current packs/day: 0.00    Average packs/day: 1 pack/day for 40.0 years (40.0 ttl pk-yrs)    Types: Cigarettes    Start date: 08/17/1950    Quit date: 08/17/1990    Years since quitting: 33.5   Smokeless tobacco: Never   Tobacco comments:    Quit in 1992  Vaping Use   Vaping status: Never Used  Substance and Sexual Activity   Alcohol use: No    Alcohol/week: 0.0 standard drinks of alcohol   Drug use: No   Sexual activity: Not Currently  Other Topics Concern   Not on file  Social  History Narrative   Not on file   Social Drivers of Health   Financial Resource Strain: Low Risk  (02/14/2018)   Overall Financial Resource Strain (CARDIA)    Difficulty of Paying Living Expenses: Not hard at all  Food Insecurity: No Food Insecurity (07/14/2023)   Hunger Vital Sign    Worried About Running Out of Food in the Last Year: Never true    Ran Out of Food in the Last Year: Never true  Transportation Needs: No Transportation Needs (07/14/2023)   PRAPARE - Administrator, Civil Service (Medical): No    Lack of Transportation (Non-Medical): No  Physical Activity: Unknown (02/14/2018)   Exercise Vital Sign    Days of Exercise per  Week: 0 days    Minutes of Exercise per Session: Not on file  Stress: No Stress Concern Present (02/14/2018)   [employer] of Occupational Health - Occupational Stress Questionnaire    Feeling of Stress : Not at all  Social Connections: Not on file     Review of Systems  Constitutional:  Negative for appetite change and unexpected weight change.  HENT:  Negative for congestion and sinus pressure.   Respiratory:  Negative for chest tightness.        Breathing overall stable.   Cardiovascular:  Negative for chest pain and palpitations.       No increased swelling.   Gastrointestinal:  Negative for abdominal pain, nausea and vomiting.  Genitourinary:  Negative for difficulty urinating and dysuria.  Musculoskeletal:  Negative for joint swelling and myalgias.  Skin:  Negative for color change and rash.  Neurological:  Negative for dizziness and headaches.  Psychiatric/Behavioral:  Negative for agitation and dysphoric mood.        Objective:     BP 122/78   Pulse 92   Temp 97.6 F (36.4 C) (Oral)   Ht 5' 4 (1.626 m)   Wt 123 lb 12.8 oz (56.2 kg)   SpO2 97%   BMI 21.25 kg/m  Wt Readings from Last 3 Encounters:  02/27/24 123 lb 12.8 oz (56.2 kg)  02/07/24 127 lb 3.2 oz (57.7 kg)  01/26/24 127 lb (57.6 kg)    Physical Exam Vitals reviewed.  Constitutional:      General: She is not in acute distress.    Appearance: Normal appearance.  HENT:     Head: Normocephalic and atraumatic.     Right Ear: External ear normal.     Left Ear: External ear normal.     Mouth/Throat:     Pharynx: No oropharyngeal exudate or posterior oropharyngeal erythema.  Eyes:     General: No scleral icterus.       Right eye: No discharge.        Left eye: No discharge.     Conjunctiva/sclera: Conjunctivae normal.  Neck:     Thyroid : No thyromegaly.  Cardiovascular:     Rate and Rhythm: Normal rate and regular rhythm.  Pulmonary:     Effort: No respiratory distress.     Breath sounds:  No wheezing.     Comments: Distant breath sound.  Abdominal:     General: Bowel sounds are normal.     Palpations: Abdomen is soft.     Tenderness: There is no abdominal tenderness.  Musculoskeletal:        General: No swelling or tenderness.     Cervical back: Neck supple. No tenderness.  Lymphadenopathy:     Cervical: No cervical adenopathy.  Skin:  Findings: No erythema or rash.  Neurological:     Mental Status: She is alert.  Psychiatric:        Mood and Affect: Mood normal.        Behavior: Behavior normal.         Outpatient Encounter Medications as of 02/27/2024  Medication Sig   acetaminophen  (TYLENOL ) 500 MG tablet Take 500 mg by mouth as needed.    albuterol  (PROVENTIL ) (2.5 MG/3ML) 0.083% nebulizer solution INHALE CONTENTS 1 VIAL VIA NEBULIZER EVERY 6 HOURS AS NEEDED WHEEZE/SHORTNESS OF BREATH J44.9   albuterol  (VENTOLIN  HFA) 108 (90 Base) MCG/ACT inhaler Inhale 1-2 puffs into the lungs every 6 (six) hours as needed for wheezing or shortness of breath.   aspirin  EC 81 MG tablet Take 81 mg by mouth daily.   azelastine  (ASTELIN ) 0.1 % nasal spray Use in each nostril as directed   azithromycin  (ZITHROMAX ) 250 MG tablet 2 tabs today, then 1 tab daily on days 2-5   Calcium  Carbonate-Vitamin D  600-400 MG-UNIT tablet Take 1 tablet by mouth at bedtime.   Ensifentrine  (OHTUVAYRE ) 3 MG/2.5ML SUSP Inhale into the lungs 2 (two) times daily.   estradiol  (ESTRACE ) 0.1 MG/GM vaginal cream Apply one pea-sized amount around the opening of the urethra daily for 2 weeks, then 3 times weekly moving forward.   famotidine (PEPCID) 20 MG tablet Take 20 mg by mouth daily as needed for heartburn or indigestion.   ferrous sulfate  325 (65 FE) MG tablet Take 325 mg by mouth 2 (two) times daily with a meal.    fexofenadine (ALLEGRA) 180 MG tablet Take 180 mg by mouth daily.   Fluticasone -Umeclidin-Vilant (TRELEGY ELLIPTA ) 200-62.5-25 MCG/ACT AEPB Inhale 1 puff into the lungs daily.    furosemide  (LASIX ) 20 MG tablet TAKE 1 TABLET BY MOUTH EVERY DAY   lansoprazole (PREVACID) 30 MG capsule Take 30 mg by mouth daily.   meclizine (ANTIVERT) 25 MG tablet Take 25 mg by mouth as needed for dizziness.   metoprolol  tartrate (LOPRESSOR ) 25 MG tablet TAKE 1 TABLET BY MOUTH TWICE A DAY   mirabegron ER (MYRBETRIQ) 50 MG TB24 tablet Take 1 tablet (50 mg total) by mouth daily.   Multiple Vitamins-Minerals (CENTRUM SILVER PO) Take by mouth.   OXYGEN  Inhale 2 L into the lungs at bedtime.   Probiotic Product (ALIGN PO) Take by mouth.   rosuvastatin  (CRESTOR ) 10 MG tablet Take 1 tablet (10 mg total) by mouth daily.   spironolactone  (ALDACTONE ) 25 MG tablet TAKE 1/2 TABLET BY MOUTH EVERY DAY   No facility-administered encounter medications on file as of 02/27/2024.     Lab Results  Component Value Date   WBC 3.5 (L) 01/20/2024   HGB 9.1 (L) 01/20/2024   HCT 29.5 (L) 01/20/2024   PLT 158 01/20/2024   GLUCOSE 105 (H) 02/27/2024   CHOL 103 02/27/2024   TRIG 101.0 02/27/2024   HDL 40.20 02/27/2024   LDLDIRECT 127.1 08/25/2012   LDLCALC 42 02/27/2024   ALT 30 02/27/2024   AST 33 02/27/2024   NA 141 02/27/2024   K 5.1 02/27/2024   CL 104 02/27/2024   CREATININE 1.45 (H) 02/27/2024   BUN 25 (H) 02/27/2024   CO2 28 02/27/2024   TSH 0.97 10/26/2023   HGBA1C 5.9 02/27/2024    NM PET Image Initial (PI) Skull Base To Thigh Result Date: 01/23/2024 EXAM: PET AND CT SKULL BASE TO MID THIGH 01/20/2024 11:53:59 AM TECHNIQUE: RADIOPHARMACEUTICAL: 7.29 mCi F-18 FDG Uptake time 60 minutes. Glucose level 104  mg/dl. PET imaging was acquired from the base of the skull to the mid thighs. Non-contrast enhanced computed tomography was obtained for attenuation correction and anatomic localization. COMPARISON: CT without contrast dated 01/03/2024. CLINICAL HISTORY: Liver lesion, assess lymphoproliferative disorder. 7.29 mCi F-18 FDG LAC IV inj @ 1035; BG 104 mg/dL; NM PET IMAGE INITIAL; LIVER LESION;  LYMPHOPROLIFERATIVE DISEASE; NPO; CURRENTLY TAKING ANTIBIOTICS; 01/03/24 CT ABDOMEN AND PELVIS WITHOUT CONTRAST. FINDINGS: HEAD AND NECK: No metabolically active cervical lymphadenopathy. CHEST: Within the medial aspect of the right lower lobe, there is a rounded focus of consolidation measuring 2 cm on image 54 with intense metabolic activity (SUV max 8.8). A small hypermetallic nodule is present in the superior segment of the right lower lobe measuring 10 mm on image 44. There is mild metabolic activity associated with small hilar lymph nodes. ABDOMEN AND PELVIS: The spleen is markedly enlarged measuring 16.5 cm in craniocaudad dimension. The spleen has relatively normal metabolic activity similar to the liver. No focal hypermetabolic lesions are identified within the liver. Mild activity is associated with periportal lymph nodes. No other clear hypermetabolic disease is identified in the abdomen and pelvis. Line Incidental CT findings include left colon diverticulosis without evidence of diverticulitis, atherosclerotic calcification of the aorta, and bulky calcifications in the left renal hilum. The uterus is unremarkable. BONES AND SOFT TISSUE: Evaluation of the skeleton demonstrates several foci of metabolic activity within the ribs. For example, a lesion of the posterior left rib with a maximum SUV of 4.0 on image 42, without CT correlation. Multiple individual foci of metabolic activity are noted within the left ribs primarily, described as indeterminate activity. No metabolically active aggressive osseous lesion. IMPRESSION: 1. Rounded 2 cm right lower lobe consolidation with intense metabolic activity (SUV max 8.8). favor focus of infection over malignancy. 2. Small 10 mm right lower lobe pulmonary nodule with mild metabolic activity. 3. Mildly metabolically active hilar and periportal lymph nodes, nonspecific. 4. Splenomegaly (craniocaudal length 16.5 cm) with metabolic activity similar to liver. 5.  Multiple foci of rib metabolic activity without CT correlate, indeterminate. 6. No lesion with concern of liver. Electronically signed by: Norleen Boxer MD 01/23/2024 11:31 AM EDT RP Workstation: [id number]       Assessment & Plan:  Encounter for screening mammogram for malignant neoplasm of breast  Hypercholesterolemia Assessment & Plan: Continue crestor .  Follow lipid panel.   Orders: -     Lipid panel -     Hepatic function panel  Hyperglycemia Assessment & Plan: Follow met b and A1c.   Orders: -     Hemoglobin A1c  Essential hypertension Assessment & Plan: Continue aldactone  and metoprolol . Follow pressures. Blood pressure as outlined. No change in medication today.   Orders: -     Basic metabolic panel with GFR  Anemia, unspecified type Assessment & Plan:  Saw Dr Jacobo 01/26/24 - takes oral iron . Was given IV venofer  01/26/24.    Stage 3b chronic kidney disease [facility]) Assessment & Plan: Has been evaluated by Dr Dennise. Continue to avoid antiinflammatory medication. Follow metabolic panel.    COPD, moderate (HCC) Assessment & Plan:   Had f/u with Dr Tamea 02/07/24 - f/u PET CT scan 01/20/24 - revealed a 2 cm right lower lobe consolidation and f/u COPD. Recommended to increase strength of trelegy inhaler. Continue using oxygen  at night and prn. Felt the spot in her lungs was pneumonia. Treated with zpak and prednisone .   Gastroesophageal reflux disease with esophagitis without hemorrhage Assessment & Plan: Taking  prevacid. No upper symptoms reported.    Liver lesion Assessment & Plan:  Saw Dr Jacobo 01/26/24 - takes oral iron . Was given IV venofer  01/26/24. Discussed liver lesion and splenomegaly and possible bone marrow biopsy.  Continue to monitor.    Nocturnal hypoxemia due to obstructive chronic bronchitis (HCC) Assessment & Plan: Continues using oxygen  at night.    Osteoporosis without current pathological fracture, unspecified osteoporosis  type Assessment & Plan: Receiving prolia . Last injection 12/06/23.    Pulmonary HTN (HCC) Assessment & Plan: Continues trelegy and oxygen  at night. Known pulmonary hypertension. ECHO - 10/18/23 - EF 60-65%, G1DD, mild LA enlargement and mild MR.   Had f/u with Dr Tamea 02/07/24 - f/u PET CT scan 01/20/24 - revealed a 2 cm right lower lobe consolidation and f/u COPD. Recommended to increase strength of trelegy inhaler. Continue using oxygen  at night and prn. Felt the spot in her lungs was pneumonia. Treated with zpak and prednisone .       Allena Hamilton, MD

## 2024-02-28 ENCOUNTER — Ambulatory Visit: Payer: Self-pay | Admitting: Internal Medicine

## 2024-03-04 ENCOUNTER — Encounter: Payer: Self-pay | Admitting: Internal Medicine

## 2024-03-04 NOTE — Assessment & Plan Note (Signed)
 Continues using oxygen  at night.

## 2024-03-04 NOTE — Assessment & Plan Note (Signed)
 Saw Dr Jacobo 01/26/24 - takes oral iron . Was given IV venofer  01/26/24. Discussed liver lesion and splenomegaly and possible bone marrow biopsy.  Continue to monitor.

## 2024-03-04 NOTE — Assessment & Plan Note (Signed)
 Has been evaluated by Dr Thedore Mins. Continue to avoid antiinflammatory medication. Follow metabolic panel.

## 2024-03-04 NOTE — Assessment & Plan Note (Signed)
 Had f/u with Dr Tamea 02/07/24 - f/u PET CT scan 01/20/24 - revealed a 2 cm right lower lobe consolidation and f/u COPD. Recommended to increase strength of trelegy inhaler. Continue using oxygen  at night and prn. Felt the spot in her lungs was pneumonia. Treated with zpak and prednisone .

## 2024-03-04 NOTE — Assessment & Plan Note (Signed)
 Continue aldactone  and metoprolol . Follow pressures. Blood pressure as outlined. No change in medication today.

## 2024-03-04 NOTE — Assessment & Plan Note (Signed)
 Saw Dr Jacobo 01/26/24 - takes oral iron . Was given IV venofer  01/26/24.

## 2024-03-04 NOTE — Assessment & Plan Note (Signed)
 Follow met b and A1c.

## 2024-03-04 NOTE — Assessment & Plan Note (Signed)
 Continues trelegy and oxygen  at night. Known pulmonary hypertension. ECHO - 10/18/23 - EF 60-65%, G1DD, mild LA enlargement and mild MR.   Had f/u with Dr Tamea 02/07/24 - f/u PET CT scan 01/20/24 - revealed a 2 cm right lower lobe consolidation and f/u COPD. Recommended to increase strength of trelegy inhaler. Continue using oxygen  at night and prn. Felt the spot in her lungs was pneumonia. Treated with zpak and prednisone .

## 2024-03-04 NOTE — Assessment & Plan Note (Signed)
 Receiving prolia . Last injection 12/06/23.

## 2024-03-04 NOTE — Assessment & Plan Note (Signed)
 Taking prevacid. No upper symptoms reported.

## 2024-03-04 NOTE — Assessment & Plan Note (Signed)
 Continue crestor . Follow lipid panel.

## 2024-03-13 ENCOUNTER — Ambulatory Visit (INDEPENDENT_AMBULATORY_CARE_PROVIDER_SITE_OTHER): Admitting: Pulmonary Disease

## 2024-03-13 ENCOUNTER — Encounter: Payer: Self-pay | Admitting: Pulmonary Disease

## 2024-03-13 VITALS — BP 130/66 | HR 90 | Temp 97.7°F | Ht 64.0 in | Wt 128.6 lb

## 2024-03-13 DIAGNOSIS — R918 Other nonspecific abnormal finding of lung field: Secondary | ICD-10-CM | POA: Diagnosis not present

## 2024-03-13 DIAGNOSIS — R0609 Other forms of dyspnea: Secondary | ICD-10-CM

## 2024-03-13 DIAGNOSIS — K439 Ventral hernia without obstruction or gangrene: Secondary | ICD-10-CM | POA: Diagnosis not present

## 2024-03-13 DIAGNOSIS — G4736 Sleep related hypoventilation in conditions classified elsewhere: Secondary | ICD-10-CM

## 2024-03-13 DIAGNOSIS — Z87891 Personal history of nicotine dependence: Secondary | ICD-10-CM

## 2024-03-13 DIAGNOSIS — J449 Chronic obstructive pulmonary disease, unspecified: Secondary | ICD-10-CM

## 2024-03-13 DIAGNOSIS — I272 Pulmonary hypertension, unspecified: Secondary | ICD-10-CM

## 2024-03-13 NOTE — Patient Instructions (Signed)
 VISIT SUMMARY:  Today, you came in for a follow-up on your COPD and discussed ongoing issues with incontinence and a hernia. We reviewed your breathing status and addressed your concerns about a recent drop in oxygen  levels. We also talked about your urinary issues and the challenges you face with your current treatment.  YOUR PLAN:  -CHRONIC OBSTRUCTIVE PULMONARY DISEASE (COPD): COPD is a chronic lung condition that makes it hard to breathe. Your oxygen  levels dropped to 91% recently, which may be related to your hernia. We will monitor your lung condition with a follow-up scan at the end of January. Please call us  if you experience any respiratory issues before your next appointment.  -ABDOMINAL HERNIA: An abdominal hernia occurs when an internal part of the body pushes through a weakness in the muscle or surrounding tissue wall. Your hernia has been present for years and may be causing temporary drops in your oxygen  levels. We will continue to monitor it for any changes in size or symptoms, and ensure it remains reducible.  INSTRUCTIONS:  Please schedule a follow-up scan for your lungs at the end of January. If you experience any respiratory issues before your next appointment, call us  immediately.

## 2024-03-13 NOTE — Progress Notes (Unsigned)
 Subjective:    Patient ID: Anita Ferrell, female    DOB: 1932-11-21, 89 y.o.   MRN: [id number]  Patient Care Team: Glendia Shad, MD as PCP - General (Internal Medicine) Tamea Dedra CROME, MD as Consulting Physician (Pulmonary Disease) Jacobo Evalene PARAS, MD as Consulting Physician (Oncology)  Chief Complaint  Patient presents with  . COPD    Occasional cough and wheezing. Shortness of breath on exertion.     BACKGROUND/INTERVAL:  HPI   Review of Systems A 10 point review of systems was performed and it is as noted above otherwise negative.   Patient Active Problem List   Diagnosis Date Noted  . Liver lesion 01/28/2024  . Frequent UTI 12/27/2023  . Urinary frequency 10/26/2023  . UTI symptoms 10/04/2023  . Nocturnal hypoxemia due to obstructive chronic bronchitis (HCC) 11/24/2022  . Osteoporosis 08/19/2020  . Low back pain 05/20/2020  . Left hip pain 05/20/2020  . Irregular heart rhythm 01/15/2020  . Nocturnal hypoxia 12/18/2019  . Pulmonary HTN (HCC) 12/26/2016  . Moderate mitral regurgitation 10/27/2016  . Bradycardia 09/23/2016  . Ventricular bigeminy 09/23/2016  . Diarrhea 01/13/2016  . CKD (chronic kidney disease) stage 3, GFR 30-59 ml/min (HCC) 10/16/2015  . Shortness of breath 03/11/2015  . COPD, moderate (HCC) 03/11/2015  . Environmental allergies 01/07/2015  . Health care maintenance 05/29/2014  . Nephrolithiasis 03/13/2014  . Obesity 03/10/2014  . Splenomegaly 03/05/2014  . Rectal bleeding 03/05/2014  . Hyperglycemia 12/30/2012  . Hemorrhoids 12/30/2012  . Grade I internal hemorrhoids 12/30/2012  . Anemia 08/27/2012  . Hypercholesterolemia 08/25/2012  . Essential hypertension 05/28/2012  . Asthma, chronic 05/28/2012  . GERD (gastroesophageal reflux disease) 05/28/2012  . Barrett's esophagus 05/28/2012    Social History   Tobacco Use  . Smoking status: Former    Current packs/day: 0.00    Average packs/day: 1 pack/day for 40.0 years  (40.0 ttl pk-yrs)    Types: Cigarettes    Start date: 08/17/1950    Quit date: 08/17/1990    Years since quitting: 33.5  . Smokeless tobacco: Never  . Tobacco comments:    Quit in 1992  Substance Use Topics  . Alcohol use: No    Alcohol/week: 0.0 standard drinks of alcohol    Allergies  Allergen Reactions  . Penicillin G Swelling  . Penicillins Swelling and Other (See Comments)    Has patient had a PCN reaction causing immediate rash, facial/tongue/throat swelling, SOB or lightheadedness with hypotension: Yes Has patient had a PCN reaction causing severe rash involving mucus membranes or skin necrosis: No Has patient had a PCN reaction that required hospitalization No Has patient had a PCN reaction occurring within the last 10 years: No If all of the above answers are NO, then may proceed with Cephalosporin use.    Current Meds  Medication Sig  . acetaminophen  (TYLENOL ) 500 MG tablet Take 500 mg by mouth as needed.   . albuterol  (PROVENTIL ) (2.5 MG/3ML) 0.083% nebulizer solution INHALE CONTENTS 1 VIAL VIA NEBULIZER EVERY 6 HOURS AS NEEDED WHEEZE/SHORTNESS OF BREATH J44.9  . albuterol  (VENTOLIN  HFA) 108 (90 Base) MCG/ACT inhaler Inhale 1-2 puffs into the lungs every 6 (six) hours as needed for wheezing or shortness of breath.  . aspirin  EC 81 MG tablet Take 81 mg by mouth daily.  . azelastine  (ASTELIN ) 0.1 % nasal spray Use in each nostril as directed  . Calcium  Carbonate-Vitamin D  600-400 MG-UNIT tablet Take 1 tablet by mouth at bedtime.  SABRA CRANBERRY EXTRACT PO  Take 500 mg by mouth daily.  SABRA docusate sodium (COLACE) 50 MG capsule Take 50 mg by mouth as needed for mild constipation.  . estradiol  (ESTRACE ) 0.1 MG/GM vaginal cream Apply one pea-sized amount around the opening of the urethra daily for 2 weeks, then 3 times weekly moving forward.  . famotidine (PEPCID) 20 MG tablet Take 20 mg by mouth daily as needed for heartburn or indigestion.  . ferrous sulfate  325 (65 FE) MG  tablet Take 325 mg by mouth 2 (two) times daily with a meal.   . fexofenadine (ALLEGRA) 180 MG tablet Take 180 mg by mouth daily.  . Fluticasone -Umeclidin-Vilant (TRELEGY ELLIPTA ) 200-62.5-25 MCG/ACT AEPB Inhale 1 puff into the lungs daily.  . furosemide  (LASIX ) 20 MG tablet TAKE 1 TABLET BY MOUTH EVERY DAY  . lansoprazole (PREVACID) 30 MG capsule Take 30 mg by mouth daily.  . meclizine (ANTIVERT) 25 MG tablet Take 25 mg by mouth as needed for dizziness.  . metoprolol  tartrate (LOPRESSOR ) 25 MG tablet TAKE 1 TABLET BY MOUTH TWICE A DAY  . mirabegron  ER (MYRBETRIQ ) 50 MG TB24 tablet Take 1 tablet (50 mg total) by mouth daily.  . Multiple Vitamins-Minerals (CENTRUM SILVER PO) Take by mouth.  . OXYGEN  Inhale 2 L into the lungs at bedtime.  . Probiotic Product (ALIGN PO) Take by mouth.  . rosuvastatin  (CRESTOR ) 10 MG tablet Take 1 tablet (10 mg total) by mouth daily.  . spironolactone  (ALDACTONE ) 25 MG tablet TAKE 1/2 TABLET BY MOUTH EVERY DAY    Immunization History  Administered Date(s) Administered  . Fluad Quad(high Dose 65+) 12/15/2018, 12/30/2020, 02/01/2022  . INFLUENZA, HIGH DOSE SEASONAL PF 01/13/2016, 01/25/2017, 01/10/2018, 01/27/2023, 01/23/2024  . Influenza Split 01/15/2020  . Influenza,inj,Quad PF,6+ Mos 12/26/2012, 01/12/2014, 01/14/2015  . Moderna Covid-19 Fall Seasonal Vaccine 57yrs & older 02/08/2022, 01/27/2023  . Moderna Covid-19 Vaccine  Bivalent Booster 2yrs & up 01/26/2021, 02/08/2022  . Moderna Sars-Covid-2 Vaccination 05/02/2019, 05/30/2019, 02/14/2020, 08/11/2020  . Pneumococcal Conjugate-13 09/02/2014  . Pneumococcal Polysaccharide-23 11/14/2012  . Tdap 11/14/2012  . Unspecified SARS-COV-2 Vaccination 01/23/2024        Objective:     Vitals:   03/13/24 1042  BP: 130/66  Pulse: 90  Temp: 97.7 F (36.5 C)  Height: 5' 4 (1.626 m)  Weight: 128 lb 9.6 oz (58.3 kg)  SpO2: 95% Comment: room air  TempSrc: Temporal  BMI (Calculated): 22.06     SpO2: 95  % (room air)  GENERAL: HEAD: Normocephalic, atraumatic.  EYES: Pupils equal, round, reactive to light.  No scleral icterus.  MOUTH:  NECK: Supple. No thyromegaly. Trachea midline. No JVD.  No adenopathy. PULMONARY: Good air entry bilaterally.  No adventitious sounds. CARDIOVASCULAR: S1 and S2. Regular rate and rhythm.  ABDOMEN: MUSCULOSKELETAL: No joint deformity, no clubbing, no edema.  NEUROLOGIC:  SKIN: Intact,warm,dry. PSYCH:        Assessment & Plan:   No diagnosis found.  No orders of the defined types were placed in this encounter.   No orders of the defined types were placed in this encounter.     Advised if symptoms do not improve or worsen, to please contact office for sooner follow up or seek emergency care.    I spent xxx minutes of dedicated to the care of this patient on the date of this encounter to include pre-visit review of records, face-to-face time with the patient discussing conditions above, post visit ordering of testing, clinical documentation with the electronic health record, making appropriate referrals as  documented, and communicating necessary findings to members of the patients care team.     C. Leita Sanders, MD Advanced Bronchoscopy PCCM [facility]    *This note was generated using voice recognition software/Dragon and/or AI transcription program.  Despite best efforts to proofread, errors can occur which can change the meaning. Any transcriptional errors that result from this process are unintentional and may not be fully corrected at the time of dictation.

## 2024-03-19 ENCOUNTER — Other Ambulatory Visit

## 2024-03-20 ENCOUNTER — Ambulatory Visit

## 2024-03-20 ENCOUNTER — Ambulatory Visit: Admitting: Oncology

## 2024-04-09 ENCOUNTER — Ambulatory Visit: Admitting: Physician Assistant

## 2024-04-09 ENCOUNTER — Encounter: Payer: Self-pay | Admitting: Oncology

## 2024-04-16 ENCOUNTER — Ambulatory Visit
Admission: RE | Admit: 2024-04-16 | Discharge: 2024-04-16 | Disposition: A | Discharge status: Home / Self Care | Source: Ambulatory Visit | Attending: Physician Assistant | Admitting: Physician Assistant

## 2024-04-16 ENCOUNTER — Ambulatory Visit
Admission: RE | Admit: 2024-04-16 | Discharge: 2024-04-16 | Disposition: A | Source: Ambulatory Visit | Attending: Physician Assistant | Admitting: Physician Assistant

## 2024-04-16 ENCOUNTER — Ambulatory Visit (INDEPENDENT_AMBULATORY_CARE_PROVIDER_SITE_OTHER): Admitting: Physician Assistant

## 2024-04-16 ENCOUNTER — Encounter: Payer: Self-pay | Admitting: Oncology

## 2024-04-16 VITALS — BP 136/78 | HR 80 | Ht 65.0 in | Wt 130.0 lb

## 2024-04-16 DIAGNOSIS — N200 Calculus of kidney: Secondary | ICD-10-CM | POA: Diagnosis not present

## 2024-04-16 DIAGNOSIS — N390 Urinary tract infection, site not specified: Secondary | ICD-10-CM

## 2024-04-16 DIAGNOSIS — R3915 Urgency of urination: Secondary | ICD-10-CM

## 2024-04-16 LAB — MICROSCOPIC EXAMINATION: WBC, UA: >30 /HPF — AB (ref 0–5)

## 2024-04-16 LAB — URINALYSIS, COMPLETE
Bilirubin, UA: NEGATIVE
Glucose, UA: NEGATIVE
Ketones, UA: NEGATIVE
Nitrite, UA: NEGATIVE
Protein,UA: NEGATIVE
Specific Gravity, UA: <1.005 — ABNORMAL LOW (ref 1.005–1.030)
Urobilinogen, Ur: 0.2 mg/dL (ref 0.2–1.0)
pH, UA: 6 (ref 5.0–7.5)

## 2024-04-16 MED ORDER — TROSPIUM CHLORIDE 20 MG PO TABS: 20.0000 mg | ORAL_TABLET | Freq: Two times a day (BID) | ORAL | 11 refills | Status: AC

## 2024-04-16 MED ORDER — TRIMETHOPRIM 100 MG PO TABS: 100.0000 mg | ORAL_TABLET | Freq: Every day | ORAL | 11 refills | Status: AC

## 2024-04-16 NOTE — Progress Notes (Signed)
 "  04/16/2024 4:36 PM   Anita  F Ferrell 1933/03/03 [id number]  CC: Chief Complaint  Patient presents with   Nephrolithiasis   HPI: Anita  F Ferrell is a 89 y.o. female with PMH CKD, recurrent Proteus UTI, asymptomatic microscopic hematuria previously managed by Dr. Ike, GSM on estrogen cream, and urinary urgency on Myrbetriq  and a partial left staghorn stone managed conservatively who presents today for follow-up.  She is accompanied today by her son, Alm, who contributes to HPI.  I ordered a CT stone study after her most recent visit with me 3 months ago.  This was notable for a new low-attenuation lesion in the liver with porta hepatic and portacaval lymphadenopathy and increased splenomegaly.  She followed up with Dr. Jacobo and completed a PET scan, which showed no areas of concern in the liver.  There were some pulmonary nodules which were favored to be infectious in etiology.  She was also offered a bone marrow biopsy for evaluation of possible lymphoproliferative disorder.  Today she reports Myrbetriq  has not been effective in controlling her urgency and nocturia, though Gemtesa  offered excellent symptomatic control.  She describes nocturia x 6-7.  Lately, she has also been having painful termination.  This resolved on recent antibiotics for pneumonia.  She denies flank pain or fevers.  She is not taking Lasix  and doses her spironolactone  in the mornings.  She has intermittently loose stools.  KUB with a stable appearing left partial staghorn stone.  In-office catheterized UA today positive for 1+ blood and 2+ leukocytes; urine microscopy with >30 WBCs/HPF, 3-10 RBCs/HPF, and moderate bacteria.  PMH: Past Medical History:  Diagnosis Date   A-fib (HCC)    Allergy    Asthma    CHF (congestive heart failure) (HCC)    COPD (chronic obstructive pulmonary disease) (HCC)    GERD (gastroesophageal reflux disease)    Hypercholesteremia    Hypertension    Mitral valve  insufficiency    Pulmonary hypertension (HCC)    Right Ventricular systolic pressure at 60 mm Hg by echo on july of 2011; Right heart cardic catherization revealed pulmonary artery pressusre of 27/10 with a mean of; Ventricular pressure 29/10 with pulmonary capillary wedge at 9   TB (pulmonary tuberculosis)    treated with INH therapy     Surgical History: Past Surgical History:  Procedure Laterality Date   CARDIAC CATHETERIZATION  10/2016   [facility]   CATARACT EXTRACTION Bilateral 2008   CHOLECYSTECTOMY     ORIF WRIST FRACTURE Right 09/24/2021   Procedure: OPEN REDUCTION INTERNAL FIXATION OF RIGHT DISTAL RADIUS FRACTURE;  Surgeon: Kathlynn Sharper, MD;  Location: [facility] ORS;  Service: Orthopedics;  Laterality: Right;   TONSILLECTOMY  1964    Home Medications:  Allergies as of 04/16/2024       Reactions   Penicillin G Swelling   Penicillins Swelling, Other (See Comments)   Has patient had a PCN reaction causing immediate rash, facial/tongue/throat swelling, SOB or lightheadedness with hypotension: Yes Has patient had a PCN reaction causing severe rash involving mucus membranes or skin necrosis: No Has patient had a PCN reaction that required hospitalization No Has patient had a PCN reaction occurring within the last 10 years: No If all of the above answers are NO, then may proceed with Cephalosporin use.        Medication List        Accurate as of April 16, 2024  4:36 PM. If you have any questions, ask your nurse or doctor.  STOP taking these medications    mirabegron  ER 50 MG Tb24 tablet Commonly known as: MYRBETRIQ  Stopped by: Lucie Hones       TAKE these medications    acetaminophen  500 MG tablet Commonly known as: TYLENOL  Take 500 mg by mouth as needed.   albuterol  (2.5 MG/3ML) 0.083% nebulizer solution Commonly known as: PROVENTIL  INHALE CONTENTS 1 VIAL VIA NEBULIZER EVERY 6 HOURS AS NEEDED WHEEZE/SHORTNESS OF BREATH J44.9   albuterol   108 (90 Base) MCG/ACT inhaler Commonly known as: Ventolin  HFA Inhale 1-2 puffs into the lungs every 6 (six) hours as needed for wheezing or shortness of breath.   ALIGN PO Take by mouth.   aspirin  EC 81 MG tablet Take 81 mg by mouth daily.   azelastine  0.1 % nasal spray Commonly known as: ASTELIN  Use in each nostril as directed   Calcium  Carbonate-Vitamin D  600-400 MG-UNIT tablet Take 1 tablet by mouth at bedtime.   CENTRUM SILVER PO Take by mouth.   CRANBERRY EXTRACT PO Take 500 mg by mouth daily.   docusate sodium 50 MG capsule Commonly known as: COLACE Take 50 mg by mouth as needed for mild constipation.   estradiol  0.1 MG/GM vaginal cream Commonly known as: ESTRACE  Apply one pea-sized amount around the opening of the urethra daily for 2 weeks, then 3 times weekly moving forward.   famotidine 20 MG tablet Commonly known as: PEPCID Take 20 mg by mouth daily as needed for heartburn or indigestion.   ferrous sulfate  325 (65 FE) MG tablet Take 325 mg by mouth 2 (two) times daily with a meal.   fexofenadine 180 MG tablet Commonly known as: ALLEGRA Take 180 mg by mouth daily.   furosemide  20 MG tablet Commonly known as: LASIX  TAKE 1 TABLET BY MOUTH EVERY DAY   lansoprazole 30 MG capsule Commonly known as: PREVACID Take 30 mg by mouth daily.   meclizine 25 MG tablet Commonly known as: ANTIVERT Take 25 mg by mouth as needed for dizziness.   metoprolol  tartrate 25 MG tablet Commonly known as: LOPRESSOR  TAKE 1 TABLET BY MOUTH TWICE A DAY   OXYGEN  Inhale 2 L into the lungs at bedtime.   rosuvastatin  10 MG tablet Commonly known as: CRESTOR  Take 1 tablet (10 mg total) by mouth daily.   spironolactone  25 MG tablet Commonly known as: ALDACTONE  TAKE 1/2 TABLET BY MOUTH EVERY DAY   Trelegy Ellipta  200-62.5-25 MCG/ACT Aepb Generic drug: Fluticasone -Umeclidin-Vilant Inhale 1 puff into the lungs daily.   trimethoprim  100 MG tablet Commonly known as:  TRIMPEX  Take 1 tablet (100 mg total) by mouth daily. Started by: Anzal Bartnick   trospium  20 MG tablet Commonly known as: SANCTURA  Take 1 tablet (20 mg total) by mouth 2 (two) times daily. Started by: Toria Monte        Allergies:  Allergies[1]  Family History: Family History  Problem Relation Age of Onset   Heart disease Mother    Heart disease Father    Hypertension Sister    Heart disease Brother    Breast cancer Maternal Aunt     Social History:   reports that she quit smoking about 33 years ago. Her smoking use included cigarettes. She started smoking about 73 years ago. She has a 40 pack-year smoking history. She has never used smokeless tobacco. She reports that she does not drink alcohol and does not use drugs.  Physical Exam: BP 136/78   Pulse 80   Ht 5' 5 (1.651 m)   Wt 130 lb (  59 kg)   BMI 21.63 kg/m   Constitutional:  Alert and oriented, no acute distress, nontoxic appearing HEENT: [place], AT Cardiovascular: No clubbing, cyanosis, or edema Respiratory: Normal respiratory effort, no increased work of breathing Skin: No rashes, bruises or suspicious lesions Neurologic: Grossly intact, no focal deficits, moving all 4 extremities Psychiatric: Normal mood and affect  Laboratory Data: Results for orders placed or performed in visit on 04/16/24  Microscopic Examination   Collection Time: 04/16/24 11:26 AM   Urine  Result Value Ref Range   WBC, UA >30 (A) 0 - 5 /hpf   RBC, Urine 3-10 (A) 0 - 2 /hpf   Epithelial Cells (non renal) 0-10 0 - 10 /hpf   Bacteria, UA Moderate (A) None seen/Few  Urinalysis, Complete   Collection Time: 04/16/24 11:26 AM  Result Value Ref Range   Specific Gravity, UA <1.005 (L) 1.005 - 1.030   pH, UA 6.0 5.0 - 7.5   Color, UA Yellow Yellow   Appearance Ur Slightly cloudy Clear   Leukocytes,UA 2+ (A) Negative   Protein,UA Negative Negative/Trace   Glucose, UA Negative Negative   Ketones, UA Negative Negative    RBC, UA 1+ (A) Negative   Bilirubin, UA Negative Negative   Urobilinogen, Ur 0.2 0.2 - 1.0 mg/dL   Nitrite, UA Negative Negative   Microscopic Examination See below:    Pertinent Imaging: KUB, 04/16/2024: See epic  I personally reviewed the images referenced above and note a stable left partial staghorn stone.  Assessment & Plan:   1. Recurrent UTI (Primary) She describes painful termination that recently resolved while on antibiotics for pneumonia.  We previously decided to defer suppressive antibiotics unless she was having symptomatic infections, though it does sound like she is having some symptoms but tolerating them reasonably well.  We revisited this conversation today and she would like to start suppressive antibiotics, which is reasonable.  Will start trimethoprim .  Macrobid is contraindicated with her underlying pulmonary issues.  We discussed that I expect her stone is colonized and so she may continue to have positive appearing urines despite suppressive antibiotic therapy. - Urinalysis, Complete - trimethoprim  (TRIMPEX ) 100 MG tablet; Take 1 tablet (100 mg total) by mouth daily.  Dispense: 30 tablet; Refill: 11  2. Urinary urgency She has failed Myrbetriq  due to inefficacy.  Gemtesa  worked well, but was cost prohibitive.  Will try trospium  IR 20 mg twice daily as an alternative and see her back in a month for symptom recheck.  If she fails this, we will pursue prior Auth for Gemtesa  again. - trospium  (SANCTURA ) 20 MG tablet; Take 1 tablet (20 mg total) by mouth 2 (two) times daily.  Dispense: 60 tablet; Refill: 11  3. Staghorn kidney stones We discussed that with the size of her partial left staghorn stone, the recommended approach would traditionally be PCNL, however she understands with her age and comorbidities that she would be a poor candidate for this.  She prefers to continue with conservative management, which is reasonable.  Return in about 4 weeks (around 05/14/2024)  for Symptom recheck with PVR.  Lucie Hones, PA-C  [facility] [address], [address] [place], [place] [address] [phone]      [1]  Allergies Allergen Reactions   Penicillin G Swelling   Penicillins Swelling and Other (See Comments)    Has patient had a PCN reaction causing immediate rash, facial/tongue/throat swelling, SOB or lightheadedness with hypotension: Yes Has patient had a PCN reaction  causing severe rash involving mucus membranes or skin necrosis: No Has patient had a PCN reaction that required hospitalization No Has patient had a PCN reaction occurring within the last 10 years: No If all of the above answers are NO, then may proceed with Cephalosporin use.   "

## 2024-04-16 NOTE — Progress Notes (Signed)
 In and Out Catheterization  Patient is present today for a I & O catheterization due to UTI. Patient was cleaned and prepped in a sterile fashion with betadine . A 14FR cath was inserted no complications were noted , of urine return was noted, urine was clear in color. A clean urine sample was collected for UA. Bladder was drained  And catheter was removed with out difficulty.    Performed by: Laymon Ned, CMA and Harlene Franks, CMA  Follow up/ Additional notes: None

## 2024-04-30 ENCOUNTER — Other Ambulatory Visit

## 2024-05-01 ENCOUNTER — Ambulatory Visit

## 2024-05-01 ENCOUNTER — Ambulatory Visit: Admitting: Oncology

## 2024-05-11 ENCOUNTER — Encounter: Payer: Self-pay | Admitting: Oncology

## 2024-05-14 ENCOUNTER — Ambulatory Visit: Admission: RE | Admit: 2024-05-14 | Source: Ambulatory Visit

## 2024-05-16 ENCOUNTER — Inpatient Hospital Stay: Payer: Self-pay

## 2024-05-17 ENCOUNTER — Ambulatory Visit: Admitting: Physician Assistant

## 2024-05-17 ENCOUNTER — Inpatient Hospital Stay: Payer: Self-pay | Admitting: Oncology

## 2024-05-17 ENCOUNTER — Inpatient Hospital Stay: Payer: Self-pay

## 2024-05-21 ENCOUNTER — Other Ambulatory Visit: Payer: Self-pay | Admitting: Cardiovascular Disease

## 2024-05-21 ENCOUNTER — Ambulatory Visit: Admission: RE | Admit: 2024-05-21 | Source: Ambulatory Visit

## 2024-05-24 ENCOUNTER — Ambulatory Visit: Admission: RE | Admit: 2024-05-24 | Discharge: 2024-05-24 | Attending: Pulmonary Disease | Admitting: Pulmonary Disease

## 2024-05-24 DIAGNOSIS — R918 Other nonspecific abnormal finding of lung field: Secondary | ICD-10-CM

## 2024-05-25 ENCOUNTER — Ambulatory Visit: Admitting: Pulmonary Disease

## 2024-05-25 ENCOUNTER — Encounter: Payer: Self-pay | Admitting: Pulmonary Disease

## 2024-05-25 NOTE — Patient Instructions (Addendum)
 VISIT SUMMARY:  During your follow-up appointment, we discussed your severe COPD with emphysema and scarring in your lungs. We also addressed your severe dry mouth and throat, which has been causing significant discomfort, especially at night.  YOUR PLAN:  -SEVERE CHRONIC OBSTRUCTIVE PULMONARY DISEASE (COPD): COPD is a chronic lung condition that makes it hard to breathe. Your condition remains stable with no significant changes in your lung condition. The previously concerning spot on your right lung has resolved, likely due to pneumonia. Your current CT shows patchy areas likely due to shallow breathing or mild bronchitis. Your lungs sound clear on examination.  -EMPHYSEMA: Emphysema is a type of COPD that involves damage to the air sacs in the lungs. Your chronic emphysema with scarring on CT shows no new changes or concerns.  -XEROSTOMIA (DRY MOUTH): Xerostomia is a condition where your mouth feels very dry. You have severe dry mouth affecting your ability to open your mouth, swallow, and speak. To help with this, I recommend using Xylimelts for dry mouth relief, keeping a glass of water by your bedside for nighttime hydration, and considering Biotene or [employer] gel for additional relief.  INSTRUCTIONS:  Please continue to monitor your symptoms and follow the recommendations provided. If you experience any new or worsening symptoms, please contact our office. Schedule a follow-up appointment as needed.

## 2024-05-25 NOTE — Progress Notes (Unsigned)
 "  Subjective:    Patient ID: Anita Ferrell, female    DOB: 14-Jan-1933, 89 y.o.   MRN: [id number]  Patient Care Team: Glendia Shad, MD as PCP - General (Internal Medicine) Tamea Dedra CROME, MD as Consulting Physician (Pulmonary Disease) Jacobo Evalene PARAS, MD as Consulting Physician (Oncology)  Chief Complaint  Patient presents with   COPD    Shortness of breath on exertion. Requesting to go over CT chest, done 05/24/2024.     BACKGROUND/INTERVAL:  HPI   Review of Systems A 10 point review of systems was performed and it is as noted above otherwise negative.   Patient Active Problem List   Diagnosis Date Noted   Liver lesion 01/28/2024   Frequent UTI 12/27/2023   Urinary frequency 10/26/2023   UTI symptoms 10/04/2023   Nocturnal hypoxemia due to obstructive chronic bronchitis (HCC) 11/24/2022   Osteoporosis 08/19/2020   Low back pain 05/20/2020   Left hip pain 05/20/2020   Irregular heart rhythm 01/15/2020   Nocturnal hypoxia 12/18/2019   Pulmonary HTN (HCC) 12/26/2016   Moderate mitral regurgitation 10/27/2016   Bradycardia 09/23/2016   Ventricular bigeminy 09/23/2016   Diarrhea 01/13/2016   CKD (chronic kidney disease) stage 3, GFR 30-59 ml/min (HCC) 10/16/2015   Shortness of breath 03/11/2015   COPD, moderate (HCC) 03/11/2015   Environmental allergies 01/07/2015   Health care maintenance 05/29/2014   Nephrolithiasis 03/13/2014   Obesity 03/10/2014   Splenomegaly 03/05/2014   Rectal bleeding 03/05/2014   Hyperglycemia 12/30/2012   Hemorrhoids 12/30/2012   Grade I internal hemorrhoids 12/30/2012   Anemia 08/27/2012   Hypercholesterolemia 08/25/2012   Essential hypertension 05/28/2012   Asthma, chronic 05/28/2012   GERD (gastroesophageal reflux disease) 05/28/2012   Barrett's esophagus 05/28/2012    Social History   Tobacco Use   Smoking status: Former    Current packs/day: 0.00    Average packs/day: 1 pack/day for 40.0 years (40.0 ttl  pk-yrs)    Types: Cigarettes    Start date: 08/17/1950    Quit date: 08/17/1990    Years since quitting: 33.7   Smokeless tobacco: Never   Tobacco comments:    Quit in 1992  Substance Use Topics   Alcohol use: No    Alcohol/week: 0.0 standard drinks of alcohol    Allergies[1]  Active Medications[2]  Immunization History  Administered Date(s) Administered   Fluad Quad(high Dose 65+) 12/15/2018, 12/30/2020, 02/01/2022   INFLUENZA, HIGH DOSE SEASONAL PF 01/13/2016, 01/25/2017, 01/10/2018, 01/27/2023, 01/23/2024   Influenza Split 01/15/2020   Influenza,inj,Quad PF,6+ Mos 12/26/2012, 01/12/2014, 01/14/2015   Moderna Covid-19 Fall Seasonal Vaccine 9yrs & older 02/08/2022, 01/27/2023   Moderna Covid-19 Vaccine  Bivalent Booster 35yrs & up 01/26/2021, 02/08/2022   Moderna Sars-Covid-2 Vaccination 05/02/2019, 05/30/2019, 02/14/2020, 08/11/2020   Pneumococcal Conjugate-13 09/02/2014   Pneumococcal Polysaccharide-23 11/14/2012   Tdap 11/14/2012   Unspecified SARS-COV-2 Vaccination 01/23/2024        Objective:     Vitals:   05/25/24 1104  BP: 116/62  Pulse: 97  Temp: 97.7 F (36.5 C)  Height: 5' 5 (1.651 m)  Weight: 131 lb 9.6 oz (59.7 kg)  SpO2: 96%  TempSrc: Temporal  BMI (Calculated): 21.9     SpO2: 96 %  GENERAL: HEAD: Normocephalic, atraumatic.  EYES: Pupils equal, round, reactive to light.  No scleral icterus.  MOUTH:  NECK: Supple. No thyromegaly. Trachea midline. No JVD.  No adenopathy. PULMONARY: Good air entry bilaterally.  No adventitious sounds. CARDIOVASCULAR: S1 and S2. Regular rate and rhythm.  ABDOMEN: MUSCULOSKELETAL: No joint deformity, no clubbing, no edema.  NEUROLOGIC:  SKIN: Intact,warm,dry. PSYCH:        Assessment & Plan:   No diagnosis found.  No orders of the defined types were placed in this encounter.   No orders of the defined types were placed in this encounter.     Advised if symptoms do not improve or worsen, to  please contact office for sooner follow up or seek emergency care.    I spent xxx minutes of dedicated to the care of this patient on the date of this encounter to include pre-visit review of records, face-to-face time with the patient discussing conditions above, post visit ordering of testing, clinical documentation with the electronic health record, making appropriate referrals as documented, and communicating necessary findings to members of the patients care team.     C. Leita Sanders, MD Advanced Bronchoscopy P[facility]    *This note was generated using voice recognition software/Dragon and/or AI transcription program.  Despite best efforts to proofread, errors can occur which can change the meaning. Any transcriptional errors that result from this process are unintentional and may not be fully corrected at the time of dictation.    [1]  Allergies Allergen Reactions   Penicillin G Swelling   Penicillins Swelling and Other (See Comments)    Has patient had a PCN reaction causing immediate rash, facial/tongue/throat swelling, SOB or lightheadedness with hypotension: Yes Has patient had a PCN reaction causing severe rash involving mucus membranes or skin necrosis: No Has patient had a PCN reaction that required hospitalization No Has patient had a PCN reaction occurring within the last 10 years: No If all of the above answers are NO, then may proceed with Cephalosporin use.  [2]  Current Meds  Medication Sig   acetaminophen  (TYLENOL ) 500 MG tablet Take 500 mg by mouth as needed.    albuterol  (PROVENTIL ) (2.5 MG/3ML) 0.083% nebulizer solution INHALE CONTENTS 1 VIAL VIA NEBULIZER EVERY 6 HOURS AS NEEDED WHEEZE/SHORTNESS OF BREATH J44.9   albuterol  (VENTOLIN  HFA) 108 (90 Base) MCG/ACT inhaler Inhale 1-2 puffs into the lungs every 6 (six) hours as needed for wheezing or shortness of breath.   aspirin  EC 81 MG tablet Take 81 mg by mouth daily.   azelastine   (ASTELIN ) 0.1 % nasal spray Use in each nostril as directed   Calcium  Carbonate-Vitamin D  600-400 MG-UNIT tablet Take 1 tablet by mouth at bedtime.   CRANBERRY EXTRACT PO Take 500 mg by mouth daily.   docusate sodium (COLACE) 50 MG capsule Take 50 mg by mouth as needed for mild constipation.   estradiol  (ESTRACE ) 0.1 MG/GM vaginal cream Apply one pea-sized amount around the opening of the urethra daily for 2 weeks, then 3 times weekly moving forward.   famotidine (PEPCID) 20 MG tablet Take 20 mg by mouth daily as needed for heartburn or indigestion.   ferrous sulfate  325 (65 FE) MG tablet Take 325 mg by mouth 2 (two) times daily with a meal.    fexofenadine (ALLEGRA) 180 MG tablet Take 180 mg by mouth daily.   Fluticasone -Umeclidin-Vilant (TRELEGY ELLIPTA ) 200-62.5-25 MCG/ACT AEPB Inhale 1 puff into the lungs daily.   furosemide  (LASIX ) 20 MG tablet TAKE 1 TABLET BY MOUTH EVERY DAY   lansoprazole (PREVACID) 30 MG capsule Take 30 mg by mouth daily.   meclizine (ANTIVERT) 25 MG tablet Take 25 mg by mouth as needed for dizziness.   metoprolol  tartrate (LOPRESSOR ) 25 MG tablet TAKE 1 TABLET BY MOUTH  TWICE A DAY   Multiple Vitamins-Minerals (CENTRUM SILVER PO) Take by mouth.   OXYGEN  Inhale 2 L into the lungs at bedtime.   Probiotic Product (ALIGN PO) Take by mouth.   rosuvastatin  (CRESTOR ) 10 MG tablet Take 1 tablet (10 mg total) by mouth daily.   spironolactone  (ALDACTONE ) 25 MG tablet TAKE 1/2 TABLET BY MOUTH DAILY   trimethoprim  (TRIMPEX ) 100 MG tablet Take 1 tablet (100 mg total) by mouth daily.   trospium  (SANCTURA ) 20 MG tablet Take 1 tablet (20 mg total) by mouth 2 (two) times daily.   "

## 2024-05-28 ENCOUNTER — Ambulatory Visit: Admitting: Physician Assistant

## 2024-05-28 DIAGNOSIS — N390 Urinary tract infection, site not specified: Secondary | ICD-10-CM

## 2024-06-07 ENCOUNTER — Inpatient Hospital Stay: Payer: Self-pay

## 2024-06-08 ENCOUNTER — Inpatient Hospital Stay: Payer: Self-pay | Admitting: Oncology

## 2024-06-08 ENCOUNTER — Inpatient Hospital Stay: Payer: Self-pay

## 2024-06-27 ENCOUNTER — Ambulatory Visit: Admitting: Internal Medicine

## 2024-08-24 ENCOUNTER — Ambulatory Visit: Admitting: Pulmonary Disease
